# Patient Record
Sex: Female | Born: 1955
Health system: Southern US, Community
[De-identification: ages and names within clinical notes are randomized; demographics above are authoritative.]

## PROBLEM LIST (undated history)

## (undated) DIAGNOSIS — I10 Essential (primary) hypertension: Secondary | ICD-10-CM

## (undated) DIAGNOSIS — J45909 Unspecified asthma, uncomplicated: Secondary | ICD-10-CM

## (undated) DIAGNOSIS — K219 Gastro-esophageal reflux disease without esophagitis: Secondary | ICD-10-CM

## (undated) DIAGNOSIS — M1990 Unspecified osteoarthritis, unspecified site: Secondary | ICD-10-CM

## (undated) DIAGNOSIS — C50212 Malignant neoplasm of upper-inner quadrant of left female breast: Secondary | ICD-10-CM

## (undated) DIAGNOSIS — F419 Anxiety disorder, unspecified: Secondary | ICD-10-CM

## (undated) DIAGNOSIS — D649 Anemia, unspecified: Secondary | ICD-10-CM

## (undated) DIAGNOSIS — C50919 Malignant neoplasm of unspecified site of unspecified female breast: Secondary | ICD-10-CM

## (undated) DIAGNOSIS — G4700 Insomnia, unspecified: Secondary | ICD-10-CM

## (undated) HISTORY — DX: Malignant neoplasm of unspecified site of unspecified female breast: C50.919

## (undated) HISTORY — PX: TONSILLECTOMY: SUR1361

## (undated) HISTORY — DX: Anxiety disorder, unspecified: F41.9

## (undated) HISTORY — DX: Malignant neoplasm of upper-inner quadrant of left female breast: C50.212

## (undated) HISTORY — DX: Essential (primary) hypertension: I10

---

## 1998-01-08 HISTORY — PX: KNEE SURGERY: SHX244

## 1999-09-27 ENCOUNTER — Other Ambulatory Visit: Admission: RE | Admit: 1999-09-27 | Discharge: 1999-09-27 | Payer: Self-pay | Admitting: Obstetrics and Gynecology

## 2000-09-27 ENCOUNTER — Other Ambulatory Visit: Admission: RE | Admit: 2000-09-27 | Discharge: 2000-09-27 | Payer: Self-pay | Admitting: Obstetrics and Gynecology

## 2001-10-01 ENCOUNTER — Other Ambulatory Visit: Admission: RE | Admit: 2001-10-01 | Discharge: 2001-10-01 | Payer: Self-pay | Admitting: Obstetrics and Gynecology

## 2002-08-28 ENCOUNTER — Encounter: Payer: Self-pay | Admitting: Emergency Medicine

## 2002-08-28 ENCOUNTER — Emergency Department (HOSPITAL_COMMUNITY): Admission: EM | Admit: 2002-08-28 | Discharge: 2002-08-28 | Payer: Self-pay | Admitting: Emergency Medicine

## 2002-10-08 ENCOUNTER — Other Ambulatory Visit: Admission: RE | Admit: 2002-10-08 | Discharge: 2002-10-08 | Payer: Self-pay | Admitting: Obstetrics and Gynecology

## 2003-10-13 ENCOUNTER — Other Ambulatory Visit: Admission: RE | Admit: 2003-10-13 | Discharge: 2003-10-13 | Payer: Self-pay | Admitting: Obstetrics and Gynecology

## 2003-12-16 ENCOUNTER — Encounter: Admission: RE | Admit: 2003-12-16 | Discharge: 2003-12-16 | Payer: Self-pay | Admitting: Family Medicine

## 2004-10-13 ENCOUNTER — Other Ambulatory Visit: Admission: RE | Admit: 2004-10-13 | Discharge: 2004-10-13 | Payer: Self-pay | Admitting: Obstetrics and Gynecology

## 2005-10-16 ENCOUNTER — Other Ambulatory Visit: Admission: RE | Admit: 2005-10-16 | Discharge: 2005-10-16 | Payer: Self-pay | Admitting: Obstetrics and Gynecology

## 2006-10-18 ENCOUNTER — Other Ambulatory Visit: Admission: RE | Admit: 2006-10-18 | Discharge: 2006-10-18 | Payer: Self-pay | Admitting: Gynecology

## 2007-10-21 ENCOUNTER — Other Ambulatory Visit: Admission: RE | Admit: 2007-10-21 | Discharge: 2007-10-21 | Payer: Self-pay | Admitting: Family Medicine

## 2008-02-05 ENCOUNTER — Ambulatory Visit: Payer: Self-pay | Admitting: Gynecology

## 2008-02-26 ENCOUNTER — Ambulatory Visit: Payer: Self-pay | Admitting: Women's Health

## 2009-12-08 ENCOUNTER — Encounter: Payer: Self-pay | Admitting: Gastroenterology

## 2009-12-13 ENCOUNTER — Encounter (INDEPENDENT_AMBULATORY_CARE_PROVIDER_SITE_OTHER): Payer: Self-pay | Admitting: *Deleted

## 2010-01-19 ENCOUNTER — Encounter (INDEPENDENT_AMBULATORY_CARE_PROVIDER_SITE_OTHER): Payer: Self-pay | Admitting: *Deleted

## 2010-01-20 ENCOUNTER — Ambulatory Visit
Admission: RE | Admit: 2010-01-20 | Discharge: 2010-01-20 | Payer: Self-pay | Source: Home / Self Care | Attending: Gastroenterology | Admitting: Gastroenterology

## 2010-02-03 ENCOUNTER — Ambulatory Visit
Admission: RE | Admit: 2010-02-03 | Discharge: 2010-02-03 | Payer: Self-pay | Source: Home / Self Care | Attending: Gastroenterology | Admitting: Gastroenterology

## 2010-02-03 ENCOUNTER — Encounter: Payer: Self-pay | Admitting: Gastroenterology

## 2010-02-07 NOTE — Letter (Signed)
Summary: Pre Visit Letter Revised  [facility]  [address] [place], [place] [address]   Phone: [phone]  Fax: [phone]        12/13/2009 MRN: [id number] Heidi Walls [address] [place], [place]  [address]             Procedure Date:  02-03-10   Welcome to the Gastroenterology Division at [facility].    You are scheduled to see a nurse for your pre-procedure visit on 01-20-10 at 9:00A.M. on the 3rd floor at [facility], [address]  We ask that you try to arrive at our office 15 minutes prior to your appointment time to allow for check-in.  Please take a minute to review the attached form.  If you answer "Yes" to one or more of the questions on the first page, we ask that you call the person listed at your earliest opportunity.  If you answer "No" to all of the questions, please complete the rest of the form and bring it to your appointment.    Your nurse visit will consist of discussing your medical and surgical history, your immediate family medical history, and your medications.   If you are unable to list all of your medications on the form, please bring the medication bottles to your appointment and we will list them.  We will need to be aware of both prescribed and over the counter drugs.  We will need to know exact dosage information as well.    Please be prepared to read and sign documents such as consent forms, a financial agreement, and acknowledgement forms.  If necessary, and with your consent, a friend or relative is welcome to sit-in on the nurse visit with you.  Please bring your insurance card so that we may make a copy of it.  If your insurance requires a referral to see a specialist, please bring your referral form from your primary care physician.  No co-pay is required for this nurse visit.     If you cannot keep your appointment, please call [phone] to cancel or reschedule prior to your appointment date.  This allows  [place] the opportunity to schedule an appointment for another patient in need of care.    Thank you for choosing [facility] for your medical needs.  We appreciate the opportunity to care for you.  Please visit [place] at our website  to learn more about our practice.  Sincerely, The Gastroenterology Division

## 2010-02-07 NOTE — Letter (Signed)
Summary: [facility]  [facility]   Imported By: Sherian Rein 12/15/2009 11:33:12  _____________________________________________________________________  External Attachment:    Type:   Image     Comment:   External Document

## 2010-02-07 NOTE — Letter (Signed)
Summary: Pt's Medication List/[facility]  Pt's Medication List/[facility]   Imported By: Sherian Rein 12/15/2009 11:34:21  _____________________________________________________________________  External Attachment:    Type:   Image     Comment:   External Document

## 2010-02-09 NOTE — Procedures (Addendum)
Summary: Colonoscopy  Patient: Heidi Walls Note: All result statuses are Final unless otherwise noted.  Tests: (1) Colonoscopy (COL)   COL Colonoscopy           DONE     [facility]     [address]     [place], [place]  [address]           COLONOSCOPY PROCEDURE REPORT     PATIENT:  [name], [name]  MR#:  [id number]     BIRTHDATE:  02/09/1955, 54 yrs. old  GENDER:  female     ENDOSCOPIST:  Judie Petit T. Russella Dar, MD, [facility]     Referred by:  Samuel Jester, DO     PROCEDURE DATE:  02/03/2010     PROCEDURE:  Colonoscopy 19147     ASA CLASS:  Class II     INDICATIONS:  1) Routine Risk Screening     MEDICATIONS:   Fentanyl 100 mcg IV, Versed 10 mg IV     DESCRIPTION OF PROCEDURE:   After the risks benefits and     alternatives of the procedure were thoroughly explained, informed     consent was obtained.  Digital rectal exam was performed and     revealed no abnormalities.   The LB [id number]-[id number] endoscope     was introduced through the anus and advanced to the cecum, which     was identified by both the appendix and ileocecal valve, without     limitations.  The quality of the prep was excellent, using     MoviPrep.  The instrument was then slowly withdrawn as the colon     was fully examined.     <<PROCEDUREIMAGES>>     FINDINGS:  A normal appearing cecum, ileocecal valve, and     appendiceal orifice were identified. The ascending, hepatic     flexure, transverse, splenic flexure, descending, sigmoid colon,     and rectum appeared unremarkable. Retroflexed views in the rectum     revealed no abnormalities. The time to cecum =  5.5  minutes. The     scope was then withdrawn (time =  10  min) from the patient and     the procedure completed.           COMPLICATIONS:  None           ENDOSCOPIC IMPRESSION:     1) Normal colon           RECOMMENDATIONS:     1) Continue current colorectal screening for "routine risk"     patients with a repeat colonoscopy in 10 years.        Venita Lick. Russella Dar, MD, [facility]           n.     eSIGNED:   Venita Lick. Stark at 02/03/2010 10:02 AM           Anda Kraft, [phone]  Note: An exclamation mark (!) indicates a result that was not dispersed into the flowsheet. Document Creation Date: 02/03/2010 10:02 AM _______________________________________________________________________  (1) Order result status: Final Collection or observation date-time: 02/03/2010 09:59 Requested date-time:  Receipt date-time:  Reported date-time:  Referring Physician:   Ordering Physician: Claudette Head [phone]) Specimen Source:  Source: Launa Grill Order Number: [id number] Lab site:   Appended Document: Colonoscopy    Clinical Lists Changes  Observations: Added new observation of COLONNXTDUE: 01/2020 (02/03/2010 13:12)

## 2010-02-09 NOTE — Letter (Signed)
Summary: [facility] Instructions  [facility]  [address] [place], [place] [address]   Phone: [phone]  Fax: [phone]       Heidi Walls    [birth year]    MRN: [id number]        Procedure Day Dorna Bloom:  Heidi Walls  02/03/10     Arrival Time:  8:30AM     Procedure Time:  9:30AM     Location of Procedure:                    Heidi Walls  [facility] (4th Floor)                      PREPARATION FOR COLONOSCOPY WITH MOVIPREP   Starting 5 days prior to your procedure 01/29/10 do not eat nuts, seeds, popcorn, corn, beans, peas,  salads, or any raw vegetables.  Do not take any fiber supplements (e.g. Metamucil, Citrucel, and Benefiber).  THE DAY BEFORE YOUR PROCEDURE         DATE: 02/02/10  DAY: THURSDAY  1.  Drink clear liquids the entire day-NO SOLID FOOD  2.  Do not drink anything colored red or purple.  Avoid juices with pulp.  No orange juice.  3.  Drink at least 64 oz. (8 glasses) of fluid/clear liquids during the day to prevent dehydration and help the prep work efficiently.  CLEAR LIQUIDS INCLUDE: Water Jello Ice Popsicles Tea (sugar ok, no milk/cream) Powdered fruit flavored drinks Coffee (sugar ok, no milk/cream) Gatorade Juice: apple, white grape, white cranberry  Lemonade Clear bullion, consomm, broth Carbonated beverages (any kind) Strained chicken noodle soup Hard Candy                             4.  In the morning, mix first dose of MoviPrep solution:    Empty 1 Pouch A and 1 Pouch B into the disposable container    Add lukewarm drinking water to the top line of the container. Mix to dissolve    Refrigerate (mixed solution should be used within 24 hrs)  5.  Begin drinking the prep at 5:00 p.m. The MoviPrep container is divided by 4 marks.   Every 15 minutes drink the solution down to the next mark (approximately 8 oz) until the full liter is complete.   6.  Follow completed prep with 16 oz of clear liquid of your choice (Nothing  red or purple).  Continue to drink clear liquids until bedtime.  7.  Before going to bed, mix second dose of MoviPrep solution:    Empty 1 Pouch A and 1 Pouch B into the disposable container    Add lukewarm drinking water to the top line of the container. Mix to dissolve    Refrigerate  THE DAY OF YOUR PROCEDURE      DATE: 02/03/10  DAY: FRIDAY  Beginning at 4:30AM (5 hours before procedure):         1. Every 15 minutes, drink the solution down to the next mark (approx 8 oz) until the full liter is complete.  2. Follow completed prep with 16 oz. of clear liquid of your choice.    3. You may drink clear liquids until 7:30AM (2 HOURS BEFORE PROCEDURE).   MEDICATION INSTRUCTIONS  Unless otherwise instructed, you should take regular prescription medications with a small sip of water   as early as possible the morning of your  procedure.        OTHER INSTRUCTIONS  You will need a responsible adult at least 55 years of age to accompany you and drive you home.   This person must remain in the waiting room during your procedure.  Wear loose fitting clothing that is easily removed.  Leave jewelry and other valuables at home.  However, you may wish to bring a book to read or  an iPod/MP3 player to listen to music as you wait for your procedure to start.  Remove all body piercing jewelry and leave at home.  Total time from sign-in until discharge is approximately 2-3 hours.  You should go home directly after your procedure and rest.  You can resume normal activities the  day after your procedure.  The day of your procedure you should not:   Drive   Make legal decisions   Operate machinery   Drink alcohol   Return to work  You will receive specific instructions about eating, activities and medications before you leave.    The above instructions have been reviewed and explained to me by   Wyona Almas RN  January 20, 2010 9:15 AM     I fully understand and can  verbalize these instructions _____________________________ Date _________

## 2010-02-09 NOTE — Miscellaneous (Signed)
Summary: [facility] Previsit/prep  Clinical Lists Changes  Medications: Added new medication of MOVIPREP 100 GM  SOLR (PEG-KCL-NACL-NASULF-NA ASC-C) As per prep instructions. - Signed Rx of MOVIPREP 100 GM  SOLR (PEG-KCL-NACL-NASULF-NA ASC-C) As per prep instructions.;  #1 x 0;  Signed;  Entered by: Wyona Almas RN;  Authorized by: Meryl Dare MD [facility];  Method used: Electronically to [facility]  [address] [phone]*, [address] [place], [place], [place]  [address], Ph: [phone] or [phone], Fax: [phone] Allergies: Added new allergy or adverse reaction of SULFA Observations: Added new observation of NKA: F (01/20/2010 8:34)    Prescriptions: MOVIPREP 100 GM  SOLR (PEG-KCL-NACL-NASULF-NA ASC-C) As per prep instructions.  #1 x 0   Entered by:   Wyona Almas RN   Authorized by:   Meryl Dare MD [facility]   Signed by:   Wyona Almas RN on 01/20/2010   Method used:   [facility] to        [facility]  [address] [phone]* (retail)       [address]       [place], [place]  [address]       Ph: [phone] or [phone]       Fax: [phone]   RxID:   [id number]

## 2011-10-27 ENCOUNTER — Emergency Department (INDEPENDENT_AMBULATORY_CARE_PROVIDER_SITE_OTHER): Payer: BC Managed Care – PPO

## 2011-10-27 ENCOUNTER — Emergency Department
Admission: EM | Admit: 2011-10-27 | Discharge: 2011-10-27 | Disposition: A | Discharge status: Home / Self Care | Payer: BC Managed Care – PPO | Source: Home / Self Care

## 2011-10-27 DIAGNOSIS — S93402A Sprain of unspecified ligament of left ankle, initial encounter: Secondary | ICD-10-CM

## 2011-10-27 DIAGNOSIS — S93409A Sprain of unspecified ligament of unspecified ankle, initial encounter: Secondary | ICD-10-CM

## 2011-10-27 DIAGNOSIS — M7989 Other specified soft tissue disorders: Secondary | ICD-10-CM

## 2011-10-27 DIAGNOSIS — X500XXA Overexertion from strenuous movement or load, initial encounter: Secondary | ICD-10-CM

## 2011-10-27 DIAGNOSIS — M7730 Calcaneal spur, unspecified foot: Secondary | ICD-10-CM

## 2011-10-27 DIAGNOSIS — M898X9 Other specified disorders of bone, unspecified site: Secondary | ICD-10-CM

## 2011-10-27 HISTORY — DX: Insomnia, unspecified: G47.00

## 2011-10-27 HISTORY — DX: Gastro-esophageal reflux disease without esophagitis: K21.9

## 2011-10-27 HISTORY — DX: Unspecified osteoarthritis, unspecified site: M19.90

## 2011-10-27 MED ORDER — KETOROLAC TROMETHAMINE 60 MG/2ML IM SOLN
60.0000 mg | Freq: Once | INTRAMUSCULAR | Status: AC
  Administered 2011-10-27: 60 mg via INTRAMUSCULAR

## 2011-10-27 NOTE — ED Provider Notes (Signed)
History     CSN: [id number]  Arrival date & time 10/27/11  1103   None     No chief complaint on file.  Patient is a 56 y.o. female presenting with ankle pain.  Ankle Pain This is a new problem. The current episode started yesterday (Pt fell in hole yesterday, rolled L ankle ). The problem occurs constantly. Progression since onset: minimally improved  The symptoms are aggravated by walking and standing. The symptoms are relieved by ice and NSAIDs. She has tried a cold compress for the symptoms. The treatment provided mild relief.    Past Medical History  Diagnosis Date  . GERD (gastroesophageal reflux disease)   . Insomnia   . Arthritis     Past Surgical History  Procedure Date  . Knee surgery     Family History  Problem Relation Age of Onset  . Heart failure Mother   . Diabetes Mother   . Hypertension Mother   . Hypertension Father     History  Substance Use Topics  . Smoking status: Not on file  . Smokeless tobacco: Not on file  . Alcohol Use: Not on file    OB History    No data available      Review of Systems  All other systems reviewed and are negative.    Allergies  Sulfonamide derivatives  Home Medications   Current Outpatient Rx  Name Route Sig Dispense Refill  . ARIPIPRAZOLE 2 MG PO TABS Oral Take 2 mg by mouth daily.    Marland Kitchen ESOMEPRAZOLE MAGNESIUM 40 MG PO CPDR Oral Take 40 mg by mouth daily before breakfast.    . MELOXICAM PO Oral Take by mouth.    Marland Kitchen RAMELTEON 8 MG PO TABS Oral Take 8 mg by mouth at bedtime.      BP 147/75  Pulse 78  Temp 97.9 F (36.6 C) (Oral)  Resp 18  Ht 5\' 2"  (1.575 m)  Wt 147 lb 8 oz (66.906 kg)  BMI 26.98 kg/m2  SpO2 96%  Physical Exam  Constitutional: She appears well-developed and well-nourished.  HENT:  Head: Normocephalic and atraumatic.  Eyes: Conjunctivae normal are normal. Pupils are equal, round, and reactive to light.  Neck: Normal range of motion. Neck supple.  Cardiovascular: Normal rate and  regular rhythm.   Pulmonary/Chest: Effort normal and breath sounds normal.  Abdominal: Soft. Bowel sounds are normal.  Musculoskeletal:       Feet:    ED Course  Procedures (including critical care time)  Labs Reviewed - No data to display Dg Ankle Complete Left  10/27/2011  *RADIOLOGY REPORT*  Clinical Data: lateral ankle sprain rule out fracture  LEFT ANKLE COMPLETE - 3+ VIEW  Comparison: None.  Findings: Three views of the left ankle submitted.  No acute fracture or subluxation.  There is soft tissue swelling adjacent to lateral malleolus.  Spurring of the tip of distal tibia and fibula. Spurring of the posterior aspect of the calcaneus at the Achilles tendon insertion.  IMPRESSION: No acute fracture or subluxation.  Soft tissue swelling adjacent to lateral malleolus.  Spurring of the distal tibia and fibula. Posterior spurring of the calcaneus.   Original Report Authenticated By: Natasha Mead, M.D.      1. Left ankle sprain   .    MDM  RICE and NSAIDs.  Ankle brace. Discussed general care and MSK red flags for reevaluation.  Sports medicine card given for follow up.     The patient and/or caregiver  has been counseled thoroughly with regard to treatment plan and/or medications prescribed including dosage, schedule, interactions, rationale for use, and possible side effects and they verbalize understanding. Diagnoses and expected course of recovery discussed and will return if not improved as expected or if the condition worsens. Patient and/or caregiver verbalized understanding.             Doree Albee, MD 10/27/11 1157

## 2011-10-27 NOTE — ED Notes (Signed)
Patient c/o left ankle pain. States fell in a hole last night; denies pain.

## 2013-04-02 ENCOUNTER — Other Ambulatory Visit: Payer: Self-pay | Admitting: Physician Assistant

## 2013-04-02 DIAGNOSIS — M8580 Other specified disorders of bone density and structure, unspecified site: Secondary | ICD-10-CM

## 2013-04-09 ENCOUNTER — Ambulatory Visit
Admission: RE | Admit: 2013-04-09 | Discharge: 2013-04-09 | Disposition: A | Discharge status: Home / Self Care | Payer: BC Managed Care – PPO | Source: Ambulatory Visit | Attending: Physician Assistant | Admitting: Physician Assistant

## 2013-04-09 DIAGNOSIS — M8580 Other specified disorders of bone density and structure, unspecified site: Secondary | ICD-10-CM

## 2015-05-07 DIAGNOSIS — S46811A Strain of other muscles, fascia and tendons at shoulder and upper arm level, right arm, initial encounter: Secondary | ICD-10-CM | POA: Diagnosis not present

## 2015-05-14 DIAGNOSIS — Z803 Family history of malignant neoplasm of breast: Secondary | ICD-10-CM | POA: Diagnosis not present

## 2015-05-14 DIAGNOSIS — Z1231 Encounter for screening mammogram for malignant neoplasm of breast: Secondary | ICD-10-CM | POA: Diagnosis not present

## 2015-05-18 DIAGNOSIS — N63 Unspecified lump in breast: Secondary | ICD-10-CM | POA: Diagnosis not present

## 2015-05-18 DIAGNOSIS — Z803 Family history of malignant neoplasm of breast: Secondary | ICD-10-CM | POA: Diagnosis not present

## 2015-05-18 DIAGNOSIS — N6489 Other specified disorders of breast: Secondary | ICD-10-CM | POA: Diagnosis not present

## 2015-05-23 DIAGNOSIS — N63 Unspecified lump in breast: Secondary | ICD-10-CM | POA: Diagnosis not present

## 2015-05-23 DIAGNOSIS — R920 Mammographic microcalcification found on diagnostic imaging of breast: Secondary | ICD-10-CM | POA: Diagnosis not present

## 2015-05-23 DIAGNOSIS — C50312 Malignant neoplasm of lower-inner quadrant of left female breast: Secondary | ICD-10-CM | POA: Diagnosis not present

## 2015-05-23 DIAGNOSIS — Z0000 Encounter for general adult medical examination without abnormal findings: Secondary | ICD-10-CM | POA: Diagnosis not present

## 2015-05-23 DIAGNOSIS — D0512 Intraductal carcinoma in situ of left breast: Secondary | ICD-10-CM | POA: Diagnosis not present

## 2015-05-26 ENCOUNTER — Telehealth: Payer: Self-pay | Admitting: *Deleted

## 2015-05-26 ENCOUNTER — Encounter: Payer: Self-pay | Admitting: *Deleted

## 2015-05-26 DIAGNOSIS — C50212 Malignant neoplasm of upper-inner quadrant of left female breast: Secondary | ICD-10-CM

## 2015-05-26 HISTORY — DX: Malignant neoplasm of upper-inner quadrant of left female breast: C50.212

## 2015-05-26 NOTE — Telephone Encounter (Signed)
Confirmed [facility] for 06/08/15 at 0815 .  Instructions and contact information given.

## 2015-06-07 NOTE — Progress Notes (Signed)
  Radiation Oncology         [phone]) [phone] ________________________________  Initial Outpatient Consultation - Date: 06/08/2015   Name: Heidi Walls MRN: [id number]   DOB: 13-Jun-1955  REFERRING PHYSICIAN: Excell Seltzer, MD  DIAGNOSIS AND STAGE: No matching staging information was found for the patient.  Breast cancer of upper-inner quadrant of left female breast.  HISTORY OF PRESENT ILLNESS::Heidi Walls is a 60 y.o. female who presents today for initial consultation. She presents today after a routine mammogram 05/14/2015 showing left breast asymmetry. She had a diagnostic mammogram and ultrasound 05/18/2015, revealing a 1.6 cm irregular mass in the left breast in the lower-inner quadrant posterior depth is suspicious of malignancy and a 6 cm area of calcifications extending anteriorly. 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  Her maternal grandmother had breast cancer. She is feeling well.   PREVIOUS RADIATION THERAPY: No  Past medical, social and family history were reviewed in the electronic chart. Review of symptoms was reviewed in the electronic chart. Medications were reviewed in the electronic chart.   PHYSICAL EXAM: There were no vitals filed for this visit.. .  Vitals with BMI 06/08/2015  Height '5\' 2"'$   Weight 150 lbs 10 oz  BMI 06.3  Systolic 016  Diastolic 78  Pulse 010  Respirations 19    Gynecologic History  Age at first menstrual period? 15  Are you still having periods? No Approximate date of last period? 11 years ago  If you are still having periods: Are your periods regular? No  If you no longer have periods: Have you used hormone replacement? Yes  If YES, for how long? 2 years When did you stop? 7 years ago Obstetric History:  How many children have you carried to term? 2 Your age at first live birth? 57  Pregnant now  or trying to get pregnant? No  Have you used birth control pills or hormone shots for contraception? Yes  If so, for how long (or approximate dates)? 66-7 years old Health Maintenance:  Have you ever had a colonoscopy? Yes If yes, date? 2012  Have you ever had a bone density? Yes If yes, date? 2012-2013  Date of your last PAP smear? 2015 Date of your FIRST mammogram? 1989   IMPRESSION: [name] is a 60 yo woman with invasive ductal carcinoma, grade 2, in the left breast. She is a good candidate for a left mastectomy and sentinel node biopsy, a port, chemotherapy, and aromatase inhibitor.  PLAN: She will not need radiation at this time because she is getting a mastectomy. We discussed that if the tumor is greater than 5 cm or if the lymph node is positive, we can discuss radiation. She knows she can call me with any questions that she has.   I spent 40 minutes  face to face with the patient and more than 50% of that time was spent in counseling and/or coordination of care.   ------------------------------------------------  Thea Silversmith, MD    This document serves as a record of services personally performed by Thea Silversmith, MD. It was created on her behalf by  Lendon Collar, a trained medical scribe. The creation of this record is based on the scribe's personal observations and the provider's statements to them. This document has been checked and approved by the attending provider.

## 2015-06-08 ENCOUNTER — Encounter: Payer: Self-pay | Admitting: Hematology and Oncology

## 2015-06-08 ENCOUNTER — Ambulatory Visit: Payer: BLUE CROSS/BLUE SHIELD | Attending: General Surgery | Admitting: Physical Therapy

## 2015-06-08 ENCOUNTER — Encounter: Payer: Self-pay | Admitting: Physical Therapy

## 2015-06-08 ENCOUNTER — Ambulatory Visit
Admission: RE | Admit: 2015-06-08 | Discharge: 2015-06-08 | Disposition: A | Discharge status: Home / Self Care | Payer: BLUE CROSS/BLUE SHIELD | Source: Ambulatory Visit | Attending: Radiation Oncology | Admitting: Radiation Oncology

## 2015-06-08 ENCOUNTER — Other Ambulatory Visit: Payer: Self-pay | Admitting: General Surgery

## 2015-06-08 ENCOUNTER — Encounter: Payer: Self-pay | Admitting: Nurse Practitioner

## 2015-06-08 ENCOUNTER — Encounter: Payer: Self-pay | Admitting: *Deleted

## 2015-06-08 ENCOUNTER — Other Ambulatory Visit (HOSPITAL_BASED_OUTPATIENT_CLINIC_OR_DEPARTMENT_OTHER): Payer: BLUE CROSS/BLUE SHIELD

## 2015-06-08 ENCOUNTER — Ambulatory Visit (HOSPITAL_BASED_OUTPATIENT_CLINIC_OR_DEPARTMENT_OTHER): Payer: BLUE CROSS/BLUE SHIELD | Admitting: Hematology and Oncology

## 2015-06-08 ENCOUNTER — Ambulatory Visit: Payer: BLUE CROSS/BLUE SHIELD | Attending: Radiation Oncology | Admitting: Radiation Oncology

## 2015-06-08 ENCOUNTER — Encounter: Payer: Self-pay | Admitting: Skilled Nursing Facility1

## 2015-06-08 VITALS — BP 161/78 | HR 111 | Temp 98.1°F | Resp 19 | Ht 62.0 in | Wt 150.6 lb

## 2015-06-08 DIAGNOSIS — Z170 Estrogen receptor positive status [ER+]: Secondary | ICD-10-CM

## 2015-06-08 DIAGNOSIS — C50212 Malignant neoplasm of upper-inner quadrant of left female breast: Secondary | ICD-10-CM

## 2015-06-08 DIAGNOSIS — C50912 Malignant neoplasm of unspecified site of left female breast: Secondary | ICD-10-CM | POA: Diagnosis not present

## 2015-06-08 DIAGNOSIS — M542 Cervicalgia: Secondary | ICD-10-CM | POA: Diagnosis not present

## 2015-06-08 DIAGNOSIS — R293 Abnormal posture: Secondary | ICD-10-CM | POA: Diagnosis not present

## 2015-06-08 LAB — CBC WITH DIFFERENTIAL/PLATELET
BASO%: 0.5 % (ref 0.0–2.0)
Basophils Absolute: 0 10*3/uL (ref 0.0–0.1)
EOS%: 1.1 % (ref 0.0–7.0)
Eosinophils Absolute: 0.1 10*3/uL (ref 0.0–0.5)
HCT: 38.8 % (ref 34.8–46.6)
HGB: 12.5 g/dL (ref 11.6–15.9)
LYMPH%: 21 % (ref 14.0–49.7)
MCH: 27.4 pg (ref 25.1–34.0)
MCHC: 32.1 g/dL (ref 31.5–36.0)
MCV: 85.1 fL (ref 79.5–101.0)
MONO#: 0.5 10*3/uL (ref 0.1–0.9)
MONO%: 6.1 % (ref 0.0–14.0)
NEUT%: 71.3 % (ref 38.4–76.8)
NEUTROS ABS: 5.7 10*3/uL (ref 1.5–6.5)
Platelets: 312 10*3/uL (ref 145–400)
RBC: 4.56 10*6/uL (ref 3.70–5.45)
RDW: 14.1 % (ref 11.2–14.5)
WBC: 8 10*3/uL (ref 3.9–10.3)
lymph#: 1.7 10*3/uL (ref 0.9–3.3)

## 2015-06-08 LAB — COMPREHENSIVE METABOLIC PANEL WITH GFR
ALT: 27 U/L (ref 0–55)
AST: 21 U/L (ref 5–34)
Albumin: 4.2 g/dL (ref 3.5–5.0)
Alkaline Phosphatase: 62 U/L (ref 40–150)
Anion Gap: 11 meq/L (ref 3–11)
BUN: 15.1 mg/dL (ref 7.0–26.0)
CO2: 28 meq/L (ref 22–29)
Calcium: 10.1 mg/dL (ref 8.4–10.4)
Chloride: 101 meq/L (ref 98–109)
Creatinine: 0.9 mg/dL (ref 0.6–1.1)
EGFR: 73 ml/min/1.73 m2 — ABNORMAL LOW
Glucose: 108 mg/dL (ref 70–140)
Potassium: 4 meq/L (ref 3.5–5.1)
Sodium: 140 meq/L (ref 136–145)
Total Bilirubin: <0.3 mg/dL (ref 0.20–1.20)
Total Protein: 8.1 g/dL (ref 6.4–8.3)

## 2015-06-08 NOTE — Progress Notes (Signed)
Clinical Social Work [place] Psychosocial Distress Screening [place]  Patient completed distress screening protocol and scored a 5 on the Psychosocial Distress Thermometer which indicates moderate distress. Clinical Social Worker met with patient and patients family in [facility] to assess for distress and other psychosocial needs. Patient stated she was feeling overwhelmed but felt "better" after meeting with the treatment team and getting more information on her treatment plan. CSW and patient discussed common feeling and emotions when being diagnosed with cancer, and the importance of support during treatment. CSW informed patient of the support team and support services at [facility] and patient was agreeable to an [employer]. CSW provided contact information and encouraged patient to call with any questions or concerns.   ONCBCN DISTRESS SCREENING 06/08/2015  Screening Type Initial Screening  Distress experienced in past week (1-10) 5  Practical problem type Work/school  Emotional problem type Nervousness/Anxiety;Adjusting to illness  Information Concerns Type Lack of info about diagnosis;Lack of info about treatment  Physical Problem type Pain  Physician notified of physical symptoms Yes  Referral to clinical psychology No  Referral to clinical social work Yes  Referral to dietition No  Referral to financial advocate No  Referral to support programs Yes  Referral to palliative care No    Johnnye Lana, MSW, LCSW, OSW-C Clinical Social Worker [place] [phone]

## 2015-06-08 NOTE — Patient Instructions (Signed)

## 2015-06-08 NOTE — Progress Notes (Signed)
[facility] CONSULT NOTE  Patient Care Team: Terald Sleeper, PA-C as PCP - General (General Practice)  CHIEF COMPLAINTS/PURPOSE OF CONSULTATION:  Newly diagnosed breast cancer  HISTORY OF PRESENTING ILLNESS:  Heidi Walls 60 y.o. female is here because of recent diagnosis of left breast cancer. She had a screening mammogram the detected left breast asymmetry in the posterior aspect of the breast measuring 1.6 cm. Anterior to this was a 6.1 cm area of calcifications that were biopsy-proven to be high-grade DCIS. The posterior breast tumor was grade 2-3 invasive ductal carcinoma that was ER/PR positive HER-2/neu positive with a Ki-67 of 20%. She was presented this morning to the multidisciplinary tumor board and she is here accompanied by her family to discuss a treatment plan.  I reviewed her records extensively and collaborated the history with the patient.  SUMMARY OF ONCOLOGIC HISTORY:   Breast cancer of upper-inner quadrant of left female breast ([place])   05/26/2015 Initial Diagnosis Screen detected left breast asymmetry (posterior 1.6 cm): Grade 2-3 IDC ER/PR positive HER-2 positive Ki-67 20% plus calcs (span 6.1 cm): High-grade DCIS with suspicious foci of invasion (4.2 cm apart)   MEDICAL HISTORY:  Past Medical History  Diagnosis Date  . GERD (gastroesophageal reflux disease)   . Insomnia   . Arthritis   . Breast cancer of upper-inner quadrant of left female breast ([place]) 05/26/2015  . Breast cancer ([place])   . Hypertension   . Anxiety     SURGICAL HISTORY: Past Surgical History  Procedure Laterality Date  . Knee surgery      SOCIAL HISTORY: Social History   Social History  . Marital Status: Married    Spouse Name: N/A  . Number of Children: N/A  . Years of Education: N/A   Occupational History  . Not on file.   Social History Main Topics  . Smoking status: Former Research scientist (life sciences)  . Smokeless tobacco: Never Used  . Alcohol Use: Yes  . Drug Use: No  . Sexual  Activity: Not on file   Other Topics Concern  . Not on file   Social History Narrative    FAMILY HISTORY: Family History  Problem Relation Age of Onset  . Heart failure Mother   . Diabetes Mother   . Hypertension Mother   . Hypertension Father   . Breast cancer Maternal Grandmother     ALLERGIES:  is allergic to codeine and sulfonamide derivatives.  MEDICATIONS:  Current Outpatient Prescriptions  Medication Sig Dispense Refill  . alendronate (FOSAMAX) 70 MG tablet   8  . ALPRAZolam (XANAX) 0.25 MG tablet   2  . ARIPiprazole (ABILIFY) 2 MG tablet Take 2 mg by mouth daily.    . Ascorbic Acid (VITAMIN C) 1000 MG tablet Take 1,000 mg by mouth daily.    . Biotin (BIOTIN 5000) 5 MG CAPS Take 5,000 mg by mouth daily.    . cyclobenzaprine (FLEXERIL) 10 MG tablet TAKE 1 TABLET (ORAL) 3 TIMES PER DAY FOR 10 DAYS  1  . hydrochlorothiazide (HYDRODIURIL) 12.5 MG tablet Take 12.5 mg by mouth daily.    . magnesium gluconate (MAGONATE) 500 MG tablet Take 500 mg by mouth daily.    . MELOXICAM PO Take 7.5 mg by mouth.     . Multiple Vitamin (MULTIVITAMIN) tablet Take 1 tablet by mouth daily.    . Omega-3 Fatty Acids (FISH OIL) 1200 MG CAPS Take 1,200 mg by mouth daily.     No current facility-administered medications for this  visit.    REVIEW OF SYSTEMS:   Constitutional: Denies fevers, chills or abnormal night sweats Eyes: Denies blurriness of vision, double vision or watery eyes Ears, nose, mouth, throat, and face: Denies mucositis or sore throat Respiratory: Denies cough, dyspnea or wheezes Cardiovascular: Denies palpitation, chest discomfort or lower extremity swelling Gastrointestinal:  Denies nausea, heartburn or change in bowel habits Skin: Denies abnormal skin rashes Lymphatics: Denies new lymphadenopathy or easy bruising Neurological:Denies numbness, tingling or new weaknesses Behavioral/Psych: Mood is stable, no new changes  Breast:  Denies any palpable lumps or  discharge All other systems were reviewed with the patient and are negative.  PHYSICAL EXAMINATION: ECOG PERFORMANCE STATUS: 0 - Asymptomatic  Filed Vitals:   06/08/15 0847  BP: 161/78  Pulse: 111  Temp: 98.1 F (36.7 C)  Resp: 19   Filed Weights   06/08/15 0847  Weight: 150 lb 9.6 oz (68.312 kg)    GENERAL:alert, no distress and comfortable SKIN: skin color, texture, turgor are normal, no rashes or significant lesions EYES: normal, conjunctiva are pink and non-injected, sclera clear OROPHARYNX:no exudate, no erythema and lips, buccal mucosa, and tongue normal  NECK: supple, thyroid normal size, non-tender, without nodularity LYMPH:  no palpable lymphadenopathy in the cervical, axillary or inguinal LUNGS: clear to auscultation and percussion with normal breathing effort HEART: regular rate & rhythm and no murmurs and no lower extremity edema ABDOMEN:abdomen soft, non-tender and normal bowel sounds Musculoskeletal:no cyanosis of digits and no clubbing  PSYCH: alert & oriented x 3 with fluent speech NEURO: no focal motor/sensory deficits BREAST: No palpable nodules in breast. No palpable axillary or supraclavicular lymphadenopathy (exam performed in the presence of a chaperone)   LABORATORY DATA:  I have reviewed the data as listed Lab Results  Component Value Date   WBC 8.0 06/08/2015   HGB 12.5 06/08/2015   HCT 38.8 06/08/2015   MCV 85.1 06/08/2015   PLT 312 06/08/2015   Lab Results  Component Value Date   NA 140 06/08/2015   K 4.0 06/08/2015   CO2 28 06/08/2015    RADIOGRAPHIC STUDIES: I have personally reviewed the radiological reports and agreed with the findings in the report.  ASSESSMENT AND PLAN:  Breast cancer of upper-inner quadrant of left female breast ([place]) Screen detected left breast asymmetry (posterior 1.6 cm): Grade 2-3 IDC ER/PR positive HER-2 positive Ki-67 20% plus calcs (span 6.1 cm): High-grade DCIS with suspicious foci of invasion (4.2 cm  apart) diagnosed May 2017  Clinical stage: T1 cN0 stage IA  Pathology and radiology counseling: Discussed with the patient, the details of pathology including the type of breast cancer,the clinical staging, the significance of ER, PR and HER-2/neu receptors and the implications for treatment. After reviewing the pathology in detail, we proceeded to discuss the different treatment options between surgery, radiation, chemotherapy, antiestrogen therapies.  Recommendation based on multidisciplinary tumor board : 1. Left mastectomy 2. Followed by adjuvant chemotherapy with TCH 3. Followed by adjuvant hormonal therapy  Return to clinic after mastectomy to discuss chemotherapy plan Patient will need a port placement during surgery.  All questions were answered. The patient knows to call the clinic with any problems, questions or concerns.    Rulon Eisenmenger, MD 06/08/2015

## 2015-06-08 NOTE — Assessment & Plan Note (Signed)
Screen detected left breast asymmetry (posterior 1.6 cm): Grade 2-3 IDC ER/PR positive HER-2 positive Ki-67 20% plus calcs (span 6.1 cm): High-grade DCIS with suspicious foci of invasion (4.2 cm apart) diagnosed May 2017  Clinical stage: T1 cN0 stage IA  Pathology and radiology counseling: Discussed with the patient, the details of pathology including the type of breast cancer,the clinical staging, the significance of ER, PR and HER-2/neu receptors and the implications for treatment. After reviewing the pathology in detail, we proceeded to discuss the different treatment options between surgery, radiation, chemotherapy, antiestrogen therapies.  Recommendation based on multidisciplinary tumor board : 1. Left mastectomy 2. Followed by adjuvant chemotherapy with TCH 3. Followed by adjuvant hormonal therapy  Return to clinic after mastectomy to discuss chemotherapy plan Patient will need a port placement during surgery.

## 2015-06-08 NOTE — Progress Notes (Signed)
Subjective:     Patient ID: Heidi Walls, female   DOB: [birth year], 60 y.o.   MRN: [id number]  HPI   Review of Systems     Objective:   Physical Exam For the patient to understand and be given the tools to implement a healthy plant based diet during their cancer diagnosis.     Assessment:     Patient was seen today and found to be high energy and accomapnied y her husband and daughter. Pts ht 62 in, 150 pounds, and BMI 27.6. Pts medications vitamin c, bitoin, multivitamin. Pt states she is high energy and high anxiety and rarley eats three even two meals a day. Pt states she will eat grapes, half a banana, and maybe half a sandwich.     Plan:     Dietitian educated the patient on implementing a plant based diet by incorporating more plant proteins, fruits, and vegetables. As a part of a healthy routine physical activity was discussed. Dietitian educated the pt on the importance of eating and offered the option of meal smoothies along with some recipes.  The importance of legitimate, evidence based information was discussed and examples were given. A folder of evidence based information with a focus on a plant based diet and general nutrition during cancer was given to the patient.  As a part of the continuum of care the cancer dietitian's contact information was given to the patient in the event they would like to have a follow up appointment.

## 2015-06-08 NOTE — Therapy (Signed)
[place], [place], [address] Phone: [phone]   Fax:  [phone]  Physical Therapy Evaluation  Patient Details  Name: Heidi Walls MRN: [id number] Date of Birth: 10-09-1955 Referring Provider: Dr. Excell Seltzer  Encounter Date: 06/08/2015      PT End of Session - 06/08/15 0916    Visit Number 1   Number of Visits 1   PT Start Time 0945   PT Stop Time 1013   PT Time Calculation (min) 28 min   Activity Tolerance Patient tolerated treatment well   Behavior During Therapy [facility] for tasks assessed/performed      Past Medical History  Diagnosis Date  . GERD (gastroesophageal reflux disease)   . Insomnia   . Arthritis   . Breast cancer of upper-inner quadrant of left female breast ([place]) 05/26/2015  . Breast cancer ([place])   . Hypertension   . Anxiety     Past Surgical History  Procedure Laterality Date  . Knee surgery      There were no vitals filed for this visit.       Subjective Assessment - 06/08/15 0916    Subjective Patient reports she is here today to be seen by her medical team for her newly diagnosed left breast cancer.   Pertinent History Patient was diagnosed on 05/23/15 with left grade 2-3 invasive ductal carcinoma triple negative breast cancer.  It measures 1.6 cm with 6 cm of calcifications with a Ki67 of 20%.   Patient Stated Goals Reduce lymphedema risk and learn post op shoulder ROM HEP   Currently in Pain? Yes   Pain Score 8    Pain Location Neck   Pain Orientation Right   Pain Descriptors / Indicators Spasm   Pain Type Acute pain   Pain Radiating Towards right shoulder   Pain Onset 1 to 4 weeks ago   Pain Frequency Constant   Aggravating Factors  Stress   Pain Relieving Factors Flexeril  Plans to see her PCP about this pain   Multiple Pain Sites No            [facility] PT Assessment - 06/08/15 0001    Assessment   Medical Diagnosis Left breast cancer   Referring Provider Dr. Excell Seltzer   Onset Date/Surgical Date 05/23/15   Hand Dominance Right   Prior Therapy none   Precautions   Precautions Other (comment)   Precaution Comments Active breast cancer   Restrictions   Weight Bearing Restrictions No   Balance Screen   Has the patient fallen in the past 6 months No   Has the patient had a decrease in activity level because of a fear of falling?  No   Is the patient reluctant to leave their home because of a fear of falling?  No   Home Environment   Living Environment Private residence   Living Arrangements Spouse/significant other   Available Help at Discharge Family   Prior Function   Level of Independence Independent   Vocation Full time employment   Holiday representative; at desk   Leisure She does not exercise   Cognition   Overall Cognitive Status Within Functional Limits for tasks assessed   Posture/Postural Control   Posture/Postural Control Postural limitations   Postural Limitations Rounded Shoulders;Forward head   ROM / Strength   AROM / PROM / Strength AROM;Strength   AROM   Overall AROM Comments Cervical AROM is WNL with c/o pain with left rotation  and left sidebending   AROM Assessment Site Shoulder   Right/Left Shoulder Right;Left   Right Shoulder Extension 52 Degrees   Right Shoulder Flexion 155 Degrees   Right Shoulder ABduction 168 Degrees   Right Shoulder Internal Rotation 70 Degrees   Right Shoulder External Rotation 79 Degrees   Left Shoulder Extension 49 Degrees   Left Shoulder Flexion 150 Degrees   Left Shoulder ABduction 164 Degrees   Left Shoulder Internal Rotation 72 Degrees   Left Shoulder External Rotation 81 Degrees   Strength   Overall Strength Within functional limits for tasks performed   Palpation   Palpation comment Tender and palpable tightness present right upper trap and right cervical parapsinals           LYMPHEDEMA/ONCOLOGY QUESTIONNAIRE - 06/08/15 0915     Type   Cancer Type Left breast cancer   Lymphedema Assessments   Lymphedema Assessments Upper extremities   Right Upper Extremity Lymphedema   10 cm Proximal to Olecranon Process 29.5 cm   Olecranon Process 24.1 cm   10 cm Proximal to Ulnar Styloid Process 21.2 cm   Just Proximal to Ulnar Styloid Process 14.8 cm   Across Hand at [employer] 17.9 cm   At [place] of 2nd Digit 5.9 cm   Left Upper Extremity Lymphedema   10 cm Proximal to Olecranon Process 30.6 cm   Olecranon Process 24.2 cm   10 cm Proximal to Ulnar Styloid Process 20.1 cm   Just Proximal to Ulnar Styloid Process 14.9 cm   Across Hand at [employer] 17.8 cm   At [place] of 2nd Digit 5.7 cm      Patient was instructed today in a home exercise program today for post op shoulder range of motion. These included active assist shoulder flexion in sitting, scapular retraction, wall walking with shoulder abduction, and hands behind head external rotation.  She was encouraged to do these twice a day, holding 3 seconds and repeating 5 times when permitted by her physician.         PT Education - 06/08/15 [phone]    Education provided Yes   Education Details Lymphedema risk reduction and post op shoulder ROM HEP   Person(s) Educated Patient   Methods Explanation;Demonstration;Handout   Comprehension Returned demonstration;Verbalized understanding              Breast Clinic Goals - 06/08/15 1029    Patient will be able to verbalize understanding of pertinent lymphedema risk reduction practices relevant to her diagnosis specifically related to skin care.   Time 1   Period Days   Status Achieved   Patient will be able to return demonstrate and/or verbalize understanding of the post-op home exercise program related to regaining shoulder range of motion.   Time 1   Period Days   Status Achieved   Patient will be able to verbalize understanding of the importance of attending the postoperative After Breast Cancer Class for  further lymphedema risk reduction education and therapeutic exercise.   Time 1   Period Days   Status Achieved              Plan - 06/08/15 0917    Clinical Impression Statement Patient was diagnosed on 05/23/15 with left grade 2-3 invasive ductal carcinoma triple negative breast cancer.  It measures 1.6 cm with 6 cm of calcifications with a Ki67 of 20%.  Her multidisciplinary medical team met prior to her assessment to determine a recommended treatment plan. She is  planning to have a left mastectomy with a sentinel node biopsy followed by chemotherapy and anti-estrogen therapy.  She will benefit from post op PT to regain shoulder ROM and reduce lymphedema.  Due to her lack of comorbidities, her good family support, her evolving condition, her evaluation is of low complexity.   Rehab Potential Excellent   Clinical Impairments Affecting Rehab Potential none   PT Frequency One time visit   PT Treatment/Interventions Therapeutic exercise;Patient/family education   PT Next Visit Plan Will f/u after surgery   PT Home Exercise Plan Post op shouder ROM HEP   Consulted and Agree with Plan of Care Patient      Patient will benefit from skilled therapeutic intervention in order to improve the following deficits and impairments:  Decreased strength, Decreased knowledge of precautions, Pain, Impaired UE functional use, Decreased range of motion  Visit Diagnosis: Malignant neoplasm of left female breast, unspecified site of breast ([place]) - Plan: PT plan of care cert/re-cert  Abnormal posture - Plan: PT plan of care cert/re-cert  Cervicalgia - Plan: PT plan of care cert/re-cert   Patient will follow up at outpatient cancer rehab if needed following surgery.  If the patient requires physical therapy at that time, a specific plan will be dictated and sent to the referring physician for approval. The patient was educated today on appropriate basic range of motion exercises to begin post operatively  and the importance of attending the After Breast Cancer class following surgery.  Patient was educated today on lymphedema risk reduction practices as it pertains to recommendations that will benefit the patient immediately following surgery.  She verbalized good understanding.  No additional physical therapy is indicated at this time.      Problem List Patient Active Problem List   Diagnosis Date Noted  . Breast cancer of upper-inner quadrant of left female breast ([place]) 05/26/2015   Annia Friendly, PT 06/08/2015 10:35 AM  [facility] [place], [place], [address] Phone: [phone]   Fax:  [phone]  Name: KENTLEY CEDILLO MRN: [id number] Date of Birth: 05/01/1955

## 2015-06-08 NOTE — Progress Notes (Signed)
Heidi Walls is a very pleasant 60 y.o. female from [place], [place] with newly diagnosed invasive ductal carcinoma with ductal carcinoma in situ of the left breast.  Biopsy results revealed the tumor's prognostic profile is ER positive, PR positive, and HER2/neu positive.   She presents today with her husband and daughter to the [facility]) for treatment consideration and recommendations from the breast surgeon, radiation oncologist, and medical oncologist.     I briefly met with Heidi Walls and her family during her [facility] visit today. We discussed the purpose of the Survivorship Clinic, which Walls include monitoring for recurrence, coordinating completion of age and gender-appropriate cancer screenings, promotion of overall wellness, as well as managing potential late/long-term side effects of anti-cancer treatments.    The treatment plan for Heidi Walls Walls likely include surgery, chemotherapy, and anti-estrogen therapy.  As of today, the intent of treatment for Heidi Walls is cure, therefore she Walls be eligible for the Survivorship Clinic upon her completion of treatment.  Her survivorship care plan (SCP) document Walls be drafted and updated throughout the course of her treatment trajectory. She Walls receive the SCP in an office visit with myself in the Survivorship Clinic once she has completed treatment.   Heidi Walls was encouraged to ask questions and all questions were answered to her satisfaction.  She was given my business card and encouraged to contact me with any concerns regarding survivorship.  I look forward to participating in her care.   Kenn File, [place] [phone]

## 2015-06-10 ENCOUNTER — Other Ambulatory Visit: Payer: BLUE CROSS/BLUE SHIELD

## 2015-06-10 ENCOUNTER — Telehealth: Payer: Self-pay | Admitting: *Deleted

## 2015-06-13 DIAGNOSIS — C50312 Malignant neoplasm of lower-inner quadrant of left female breast: Secondary | ICD-10-CM | POA: Diagnosis not present

## 2015-06-22 ENCOUNTER — Encounter (HOSPITAL_BASED_OUTPATIENT_CLINIC_OR_DEPARTMENT_OTHER): Payer: Self-pay | Admitting: *Deleted

## 2015-06-22 ENCOUNTER — Other Ambulatory Visit: Payer: Self-pay | Admitting: General Surgery

## 2015-06-22 NOTE — H&P (Signed)
History of Present Illness Heidi Kitchen [name] MD; 06/08/2015 10:04 AM) The patient is a 60 year old female who presents with breast cancer. She is a 60 YO postmenopausal female referred by Dr. Luan Pulling for evaluation of recently diagnosed carcinoma of the left breast. She recently presented for a screening mamogram revealing a new area of architectural distortion and microcalcifications in the left breast. Subsequent imaging included diagnostic mamogram showing a 0.8 cm focal asymmetry in the left breast upper inner quadrant posterior depth 8 cm from the nipple and multiple linear calcifications extending anteriorly away from this mass for an additional 5 cm and ultrasound showing a 1.6 cm irregular mass, hypoechoic, in the lower inner left breast posterior depth. An ultrasound guided breast biopsy of the posterior 1.6 cm mass was performed 05/23/2015 on with pathology revealing invasive ductal carcinoma of the breast. 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 She is seen now in breast multidisciplinary clinic for initial treatment planning. She has experienced no breast symptoms, specifically lump or nipple discharge or skin changes or pain. she has dense breast tissue on mammogram and previous questionably abnormal mammograms on the right side but no biopsies required  Findings at that time were the following: Tumor size: 1.6 cm invasive disease and 5 cm DCIS totaling greater than 6 cm Tumor grade: 2-3 Estrogen Receptor: +100% Progesterone Receptor: +5% Her-2 neu: positive Lymph node status: negative    Other Problems Heidi Slipper, RN; 06/08/2015 7:50 AM) Anxiety Disorder Arthritis Gastroesophageal Reflux Disease High blood pressure Melanoma  Past Surgical History Heidi Slipper, RN; 06/08/2015 7:50 AM) Breast Biopsy Left. Knee Surgery Right.  Diagnostic Studies  History Heidi Slipper, RN; 06/08/2015 7:50 AM) Colonoscopy 1-5 years ago Mammogram within last year Pap Smear 1-5 years ago  Medication History Heidi Slipper, RN; 06/08/2015 7:51 AM) ALPRAZolam (0.'25MG'$  Tablet, Oral) Active. Cyclobenzaprine HCl ('10MG'$  Tablet, Oral) Active. Abilify ('2MG'$  Tablet, Oral) Active. NexIUM ('40MG'$  Capsule DR, Oral) Active. Meloxicam ('15MG'$  Tablet, Oral) Active. Ramelteon ('8MG'$  Tablet, Oral) Active. Medications Reconciled  Social History Heidi Slipper, RN; 06/08/2015 7:50 AM) Alcohol use Occasional alcohol use. Caffeine use Coffee. Illicit drug use Uses socially only. Tobacco use Former smoker.  Family History Heidi Slipper, RN; 06/08/2015 7:50 AM) Alcohol Abuse Father. Arthritis Brother, Sister. Breast Cancer Family Members In General. Diabetes Mellitus Mother. Heart Disease Mother. Heart disease in female family member before age 34 Hypertension Brother, Father, Mother, Sister.  Pregnancy / Birth History Heidi Slipper, RN; 06/08/2015 7:50 AM) Age at menarche 33 years. Age of menopause 48-50 Contraceptive History Oral contraceptives. Gravida 2 Maternal age 72-20 Para 2    Review of Systems Heidi Slipper RN; 06/08/2015 7:50 AM) General Not Present- Appetite Loss, Chills, Fatigue, Fever, Night Sweats, Weight Gain and Weight Loss. Skin Not Present- Change in Wart/Mole, Dryness, Hives, Jaundice, New Lesions, Non-Healing Wounds, Rash and Ulcer. HEENT Present- Wears glasses/contact lenses. Not Present- Earache, Hearing Loss, Hoarseness, Nose Bleed, Oral Ulcers, Ringing in the Ears, Seasonal Allergies, Sinus Pain, Sore Throat, Visual Disturbances and Yellow Eyes. Respiratory Not Present- Bloody sputum, Chronic Cough, Difficulty Breathing, Snoring and Wheezing. Breast Not Present- Breast Mass, Breast Pain, Nipple Discharge and Skin Changes. Cardiovascular Not Present- Chest Pain, Difficulty Breathing Lying Down, Leg Cramps, Palpitations, Rapid  Heart Rate, Shortness of Breath and Swelling of Extremities. Gastrointestinal Not Present- Abdominal Pain, Bloating, Bloody Stool, Change in Bowel Habits, Chronic diarrhea, Constipation, Difficulty Swallowing, Excessive gas, Gets full  quickly at meals, Hemorrhoids, Indigestion, Nausea, Rectal Pain and Vomiting. Female Genitourinary Not Present- Frequency, Nocturia, Painful Urination, Pelvic Pain and Urgency. Musculoskeletal Present- Joint Stiffness. Not Present- Back Pain, Joint Pain, Muscle Pain, Muscle Weakness and Swelling of Extremities. Neurological Not Present- Decreased Memory, Fainting, Headaches, Numbness, Seizures, Tingling, Tremor, Trouble walking and Weakness. Psychiatric Present- Anxiety. Not Present- Bipolar, Change in Sleep Pattern, Depression, Fearful and Frequent crying. Endocrine Not Present- Cold Intolerance, Excessive Hunger, Hair Changes, Heat Intolerance, Hot flashes and New Diabetes. Hematology Not Present- Easy Bruising, Excessive bleeding, Gland problems, HIV and Persistent Infections.   Physical Exam Heidi Kitchen T. Quandra Fedorchak MD; 06/08/2015 10:05 AM) The physical exam findings are as follows: Note:General: Alert, well-developed and well nourished Caucasian female, in no distress Skin: Warm and dry without rash or infection. HEENT: No palpable masses or thyromegaly. Sclera nonicteric. Pupils equal round and reactive. Lymph nodes: No cervical, supraclavicular, or inguinal nodes palpable. Breasts: Bruising medial left breast without discrete mass. No other palpable abnormalities in either breast.Skin changes or nipple inversion or crusting. No palpable axillary adenopathy. Lungs: Breath sounds clear and equal. No wheezing or increased work of breathing. Cardiovascular: Regular rate and rhythm without murmer. No JVD or edema. Peripheral pulses intact. No carotid bruits. Abdomen: Nondistended. Soft and nontender. No masses palpable. No organomegaly. No palpable  hernias. Extremities: No edema or joint swelling or deformity. No chronic venous stasis changes. Neurologic: Alert and fully oriented. Gait normal. No focal weakness. Psychiatric: Normal mood and affect. Thought content appropriate with normal judgement and insight    Assessment & Plan Heidi Kitchen T. Jazmene Racz MD; 06/08/2015 10:11 AM) STAGE I BREAST CANCER, LEFT (C48.889) Impression: 60 year old female with a new diagnosis of cancer of the left breast, lower inner quadrant. Clinical stage 1 a, ER positive, PR positive, HER-2 positive. in addition to her 1.6 cm invasive tumor there is a fairly extensivs extendingalcifications and biopsy proven DCIS extending 5-6 cm anterior to this.I discussed with the patient and family members present today initial surgical treatment options. We discussed options of breast conservation with lumpectomy or total mastectomy and sentinal lymph node biopsy/dissection. Options for reconstruction were discussed. I think this area is likely too large for successful lumpectomy with good margins and good cosmetic outcome.After discussion she is very comfortable with total mastectomy on the left side and would be happy to avoid radiation therapy. We spent a good while discussing reconstruction options and she feena very coy and down timeshe does not want additional surgery and down time evidence off thing for no reconstruction. I offered plastic surgery referral and evaluation if she changes her mind. We discussed the indications and nature of the procedure, and expected recovery, in detail. Surgical risks including anesthetic complications, cardiorespiratory complications, bleeding, infection, wound healing complications, blood clots, lymphedema, local and distant recurrence and possible need for further surgery based on the final pathology was discussed and understood. Chemotherapy, hormonal therapy and radiation therapy have been discussed. she will need a Port-A-Cath for Herceptin  and we disccific risks ofcedure including indications and specific risks of pneumothorax or catheter displacement of malfunction and DVT. They have been provided with literature regarding the treatment of breast cancer. All questions were answered. They understand and agree to proceed and we will go ahead with scheduling. Current Plans Pt Education - [facility] Breast Cancer Information Given - [facility] "Breast Journey" Package left total mastectomy and axillary sentinel lymph node biopsy and placement of Port-A-Cath

## 2015-06-22 NOTE — H&P (Signed)
  Patient of Drs. Hoxworth and Gudena with plan for breast reconstruction. Presented following screening MMG with left breast mass in the posterior aspect of the breast measuring 1.6 cm. Anterior to this was a 6.1 cm area of calcifications that were biopsy-proven to be high-grade DCIS. The posterior breast tumor biopsy with IDC,  ER/PR +HER-2/neu +. Given extent of disease mastectomy has been recommended. She will receive adjuvant chemotherapy and plan port placement at time mastectomy.   Current 34C, does not desire any larger. Wt stable. Works as Scientist, clinical (histocompatibility and immunogenetics) at [employer].   Review of Systems  Hematological: Bruises/bleeds easily.  Psychiatric/Behavioral: The patient is nervous/anxious.  Remainder of 12 point review negative     Objective:   Physical Exam  Cardiovascular: Normal rate. Normal heart sounds Pulmonary/Chest: Effort normal. Clear to ausculatation. Abdominal: Soft.  Redundant tissue without panniculus  Genitourinary: No breast discharge.  Lymphadenopathy:  She has no axillary adenopathy.  Skin:  Fitzpatrick 2, tan with elastosis chest, decolette   Pseudoptosis/grade 1 ptosis bilateral Resolving ecchymoses left SN to nipple R 24 L 24 cm BW R 16 L 16 cm Nipple to IMF R 10 L 10cm    Assessment:     Left breast cancer LIQ    Plan:     Reviewed options prosthesis and provided brochure for [facility]. Discussed immediate vs delayed, autologous vs implant based reconstruction. Reviewed incisions, drains, OR length, hospital stay and recovery for each. Discussed process of expansion and implant based risks including rupture, MRI surveillance for silicone implants, infection requiring surgery or removal, contracture. Discussed asymmetry one can expect with implant unilateral and natural breast on opposite. Discussed TRAM vs DIEP, latter would require treatment at tertiary center. Discussed abdominal based complications including failure flap, abdominal bulge or hernia. Discussed  future surgery dependent on adjuvant treatments.  Patient desires to proceed with immediate expander and possible acellular dermis reconstruction. Discussed cadaveric source ADM, use in breast reconstruction. Additional risks including but not limited to bleeding, infection, delayed healing, anesthesia risks, skin sensation changes, injury to structures including nerves, blood vessels, and muscles which may be temporary or permanent, allergies to tape, suture materials and glues, blood products, topical preparations or injected agents, skin contour irregularities, skin discoloration and swelling, deep vein thrombosis, cardiac and pulmonary complications, pain, which may persist, fluid accumulation, wrinkling of the skin over the expander, changes in nipple or breast sensation, expander leakage or rupture, faulty position of the expander, persistent pain, formation of tight scar tissue around the expander (capsular contracture), possible need for revisional surgery or staged procedures discussed.  Irene Limbo, MD [facility] Plastic & Reconstructive Surgery [phone], pin [phone]

## 2015-06-23 ENCOUNTER — Encounter (HOSPITAL_BASED_OUTPATIENT_CLINIC_OR_DEPARTMENT_OTHER)
Admission: RE | Admit: 2015-06-23 | Discharge: 2015-06-23 | Disposition: A | Discharge status: Home / Self Care | Payer: BLUE CROSS/BLUE SHIELD | Source: Ambulatory Visit | Attending: General Surgery | Admitting: General Surgery

## 2015-06-23 DIAGNOSIS — C50312 Malignant neoplasm of lower-inner quadrant of left female breast: Secondary | ICD-10-CM | POA: Diagnosis not present

## 2015-06-23 DIAGNOSIS — K219 Gastro-esophageal reflux disease without esophagitis: Secondary | ICD-10-CM | POA: Diagnosis not present

## 2015-06-23 DIAGNOSIS — C50912 Malignant neoplasm of unspecified site of left female breast: Secondary | ICD-10-CM | POA: Diagnosis present

## 2015-06-23 DIAGNOSIS — Z87891 Personal history of nicotine dependence: Secondary | ICD-10-CM | POA: Diagnosis not present

## 2015-06-23 DIAGNOSIS — I110 Hypertensive heart disease with heart failure: Secondary | ICD-10-CM | POA: Diagnosis not present

## 2015-06-23 DIAGNOSIS — Z170 Estrogen receptor positive status [ER+]: Secondary | ICD-10-CM | POA: Diagnosis not present

## 2015-06-23 NOTE — Progress Notes (Signed)
Pt given a 8oz carton of boost breeze with verbal and written instructions to drink at 900 am dos and not to drink any of liquids after midnight.  Teach back and pt verbalized understanding.

## 2015-06-24 ENCOUNTER — Encounter (HOSPITAL_BASED_OUTPATIENT_CLINIC_OR_DEPARTMENT_OTHER): Payer: Self-pay | Admitting: *Deleted

## 2015-06-24 ENCOUNTER — Encounter (HOSPITAL_COMMUNITY)
Admission: RE | Admit: 2015-06-24 | Discharge: 2015-06-24 | Disposition: A | Discharge status: Home / Self Care | Payer: BLUE CROSS/BLUE SHIELD | Source: Ambulatory Visit | Attending: General Surgery | Admitting: General Surgery

## 2015-06-24 ENCOUNTER — Ambulatory Visit (HOSPITAL_COMMUNITY): Payer: BLUE CROSS/BLUE SHIELD

## 2015-06-24 ENCOUNTER — Ambulatory Visit (HOSPITAL_BASED_OUTPATIENT_CLINIC_OR_DEPARTMENT_OTHER)
Admission: RE | Admit: 2015-06-24 | Discharge: 2015-06-25 | Disposition: A | Discharge status: Home / Self Care | Payer: BLUE CROSS/BLUE SHIELD | Source: Ambulatory Visit | Attending: General Surgery | Admitting: General Surgery

## 2015-06-24 ENCOUNTER — Ambulatory Visit (HOSPITAL_BASED_OUTPATIENT_CLINIC_OR_DEPARTMENT_OTHER): Payer: BLUE CROSS/BLUE SHIELD | Admitting: Certified Registered"

## 2015-06-24 ENCOUNTER — Other Ambulatory Visit: Payer: Self-pay | Admitting: *Deleted

## 2015-06-24 ENCOUNTER — Encounter (HOSPITAL_BASED_OUTPATIENT_CLINIC_OR_DEPARTMENT_OTHER)
Admission: RE | Disposition: A | Discharge status: Home / Self Care | Payer: Self-pay | Source: Ambulatory Visit | Attending: General Surgery

## 2015-06-24 DIAGNOSIS — I110 Hypertensive heart disease with heart failure: Secondary | ICD-10-CM | POA: Insufficient documentation

## 2015-06-24 DIAGNOSIS — Z87891 Personal history of nicotine dependence: Secondary | ICD-10-CM | POA: Insufficient documentation

## 2015-06-24 DIAGNOSIS — C50312 Malignant neoplasm of lower-inner quadrant of left female breast: Secondary | ICD-10-CM | POA: Diagnosis not present

## 2015-06-24 DIAGNOSIS — K219 Gastro-esophageal reflux disease without esophagitis: Secondary | ICD-10-CM | POA: Insufficient documentation

## 2015-06-24 DIAGNOSIS — G8918 Other acute postprocedural pain: Secondary | ICD-10-CM | POA: Diagnosis not present

## 2015-06-24 DIAGNOSIS — Z95828 Presence of other vascular implants and grafts: Secondary | ICD-10-CM

## 2015-06-24 DIAGNOSIS — Z170 Estrogen receptor positive status [ER+]: Secondary | ICD-10-CM | POA: Insufficient documentation

## 2015-06-24 DIAGNOSIS — C50912 Malignant neoplasm of unspecified site of left female breast: Secondary | ICD-10-CM | POA: Diagnosis present

## 2015-06-24 DIAGNOSIS — R079 Chest pain, unspecified: Secondary | ICD-10-CM | POA: Diagnosis not present

## 2015-06-24 DIAGNOSIS — C50919 Malignant neoplasm of unspecified site of unspecified female breast: Secondary | ICD-10-CM

## 2015-06-24 DIAGNOSIS — Z452 Encounter for adjustment and management of vascular access device: Secondary | ICD-10-CM | POA: Diagnosis not present

## 2015-06-24 HISTORY — PX: BREAST RECONSTRUCTION WITH PLACEMENT OF TISSUE EXPANDER AND FLEX HD (ACELLULAR HYDRATED DERMIS): SHX6295

## 2015-06-24 HISTORY — PX: MASTECTOMY W/ SENTINEL NODE BIOPSY: SHX2001

## 2015-06-24 HISTORY — PX: PORTACATH PLACEMENT: SHX2246

## 2015-06-24 SURGERY — INSERTION, TUNNELED CENTRAL VENOUS DEVICE, WITH PORT
Anesthesia: Regional | Site: Chest | Laterality: Right

## 2015-06-24 MED ORDER — KETOROLAC TROMETHAMINE 30 MG/ML IJ SOLN
INTRAMUSCULAR | Status: AC
  Filled 2015-06-24: qty 1

## 2015-06-24 MED ORDER — ONDANSETRON HCL 4 MG/2ML IJ SOLN
INTRAMUSCULAR | Status: DC | PRN
  Administered 2015-06-24: 4 mg via INTRAVENOUS

## 2015-06-24 MED ORDER — BUPIVACAINE-EPINEPHRINE (PF) 0.5% -1:200000 IJ SOLN
INTRAMUSCULAR | Status: DC | PRN
  Administered 2015-06-24: 30 mL via PERINEURAL

## 2015-06-24 MED ORDER — PROPOFOL 500 MG/50ML IV EMUL
INTRAVENOUS | Status: DC | PRN
  Administered 2015-06-24: 75 ug/kg/min via INTRAVENOUS

## 2015-06-24 MED ORDER — PROPOFOL 10 MG/ML IV BOLUS
INTRAVENOUS | Status: DC | PRN
  Administered 2015-06-24: 140 mg via INTRAVENOUS

## 2015-06-24 MED ORDER — CHLORHEXIDINE GLUCONATE 4 % EX LIQD: 1.0000 "application " | Freq: Once | CUTANEOUS | Status: DC

## 2015-06-24 MED ORDER — ACETAMINOPHEN 10 MG/ML IV SOLN
INTRAVENOUS | Status: AC
  Filled 2015-06-24: qty 100

## 2015-06-24 MED ORDER — MIDAZOLAM HCL 2 MG/2ML IJ SOLN
INTRAMUSCULAR | Status: AC
  Filled 2015-06-24: qty 2

## 2015-06-24 MED ORDER — LIDOCAINE HCL (CARDIAC) 20 MG/ML IV SOLN
INTRAVENOUS | Status: DC | PRN
  Administered 2015-06-24: 60 mg via INTRAVENOUS

## 2015-06-24 MED ORDER — LACTATED RINGERS IV SOLN
INTRAVENOUS | Status: DC
  Administered 2015-06-24 (×3): via INTRAVENOUS

## 2015-06-24 MED ORDER — SODIUM CHLORIDE 0.9 % IV SOLN
INTRAVENOUS | Status: DC | PRN
  Administered 2015-06-24: 300 mL

## 2015-06-24 MED ORDER — CYCLOBENZAPRINE HCL 10 MG PO TABS: 10.0000 mg | ORAL_TABLET | Freq: Three times a day (TID) | ORAL | Status: DC | PRN

## 2015-06-24 MED ORDER — SCOPOLAMINE 1 MG/3DAYS TD PT72: 1.0000 | MEDICATED_PATCH | Freq: Once | TRANSDERMAL | Status: DC | PRN

## 2015-06-24 MED ORDER — SUCCINYLCHOLINE CHLORIDE 20 MG/ML IJ SOLN
INTRAMUSCULAR | Status: DC | PRN
  Administered 2015-06-24: 60 mg via INTRAVENOUS

## 2015-06-24 MED ORDER — ROCURONIUM 10MG/ML (10ML) SYRINGE FOR MEDFUSION PUMP - OPTIME
INTRAVENOUS | Status: DC | PRN
  Administered 2015-06-24: 30 mg via INTRAVENOUS

## 2015-06-24 MED ORDER — PROPOFOL 500 MG/50ML IV EMUL
INTRAVENOUS | Status: AC
  Filled 2015-06-24: qty 50

## 2015-06-24 MED ORDER — ACETAMINOPHEN 325 MG PO TABS: 325.0000 mg | ORAL_TABLET | ORAL | Status: DC | PRN

## 2015-06-24 MED ORDER — HEPARIN (PORCINE) IN NACL 2-0.9 UNIT/ML-% IJ SOLN
INTRAMUSCULAR | Status: DC | PRN
  Administered 2015-06-24: 1 via INTRAVENOUS

## 2015-06-24 MED ORDER — OXYCODONE HCL 5 MG PO TABS: 5.0000 mg | ORAL_TABLET | Freq: Once | ORAL | Status: DC | PRN

## 2015-06-24 MED ORDER — LIDOCAINE 2% (20 MG/ML) 5 ML SYRINGE
INTRAMUSCULAR | Status: AC
  Filled 2015-06-24: qty 5

## 2015-06-24 MED ORDER — FENTANYL CITRATE (PF) 100 MCG/2ML IJ SOLN: 25.0000 ug | INTRAMUSCULAR | Status: DC | PRN

## 2015-06-24 MED ORDER — SUCCINYLCHOLINE CHLORIDE 200 MG/10ML IV SOSY
PREFILLED_SYRINGE | INTRAVENOUS | Status: AC
  Filled 2015-06-24: qty 10

## 2015-06-24 MED ORDER — ARIPIPRAZOLE 2 MG PO TABS: 2.0000 mg | ORAL_TABLET | Freq: Every day | ORAL | Status: DC

## 2015-06-24 MED ORDER — ESMOLOL HCL 100 MG/10ML IV SOLN
INTRAVENOUS | Status: AC
  Filled 2015-06-24: qty 10

## 2015-06-24 MED ORDER — GLYCOPYRROLATE 0.2 MG/ML IJ SOLN: 0.2000 mg | Freq: Once | INTRAMUSCULAR | Status: DC | PRN

## 2015-06-24 MED ORDER — FENTANYL CITRATE (PF) 100 MCG/2ML IJ SOLN
INTRAMUSCULAR | Status: AC
  Filled 2015-06-24: qty 2

## 2015-06-24 MED ORDER — TECHNETIUM TC 99M SULFUR COLLOID FILTERED
1.0000 | Freq: Once | INTRAVENOUS | Status: AC | PRN
  Administered 2015-06-24: 1 via INTRADERMAL

## 2015-06-24 MED ORDER — ACETAMINOPHEN 160 MG/5ML PO SOLN: 325.0000 mg | ORAL | Status: DC | PRN

## 2015-06-24 MED ORDER — PANTOPRAZOLE SODIUM 40 MG PO TBEC: 40.0000 mg | DELAYED_RELEASE_TABLET | Freq: Every day | ORAL | Status: DC

## 2015-06-24 MED ORDER — CEFAZOLIN SODIUM-DEXTROSE 2-4 GM/100ML-% IV SOLN
2.0000 g | INTRAVENOUS | Status: AC
  Administered 2015-06-24: 2 g via INTRAVENOUS

## 2015-06-24 MED ORDER — HEPARIN SOD (PORK) LOCK FLUSH 100 UNIT/ML IV SOLN
INTRAVENOUS | Status: AC
  Filled 2015-06-24: qty 5

## 2015-06-24 MED ORDER — OXYCODONE HCL 5 MG PO TABS
5.0000 mg | ORAL_TABLET | ORAL | Status: DC | PRN
  Administered 2015-06-24 – 2015-06-25 (×3): 5 mg via ORAL
  Filled 2015-06-24 (×3): qty 1

## 2015-06-24 MED ORDER — HEPARIN SOD (PORK) LOCK FLUSH 100 UNIT/ML IV SOLN
INTRAVENOUS | Status: DC | PRN
  Administered 2015-06-24: 500 [IU] via INTRAVENOUS

## 2015-06-24 MED ORDER — ACETAMINOPHEN 10 MG/ML IV SOLN
1000.0000 mg | Freq: Once | INTRAVENOUS | Status: AC | PRN
  Administered 2015-06-24: 1000 mg via INTRAVENOUS

## 2015-06-24 MED ORDER — OXYCODONE HCL 5 MG/5ML PO SOLN: 5.0000 mg | Freq: Once | ORAL | Status: DC | PRN

## 2015-06-24 MED ORDER — PHENYLEPHRINE 40 MCG/ML (10ML) SYRINGE FOR IV PUSH (FOR BLOOD PRESSURE SUPPORT)
PREFILLED_SYRINGE | INTRAVENOUS | Status: AC
  Filled 2015-06-24: qty 10

## 2015-06-24 MED ORDER — ONDANSETRON 4 MG PO TBDP
4.0000 mg | ORAL_TABLET | Freq: Four times a day (QID) | ORAL | Status: DC | PRN
Start: 2015-06-24 — End: 2015-06-25

## 2015-06-24 MED ORDER — SODIUM BICARBONATE 4 % IV SOLN
INTRAVENOUS | Status: AC
  Filled 2015-06-24: qty 5

## 2015-06-24 MED ORDER — DEXAMETHASONE SODIUM PHOSPHATE 4 MG/ML IJ SOLN
INTRAMUSCULAR | Status: DC | PRN
  Administered 2015-06-24: 10 mg via INTRAVENOUS

## 2015-06-24 MED ORDER — KETOROLAC TROMETHAMINE 30 MG/ML IJ SOLN
30.0000 mg | Freq: Three times a day (TID) | INTRAMUSCULAR | Status: DC
  Administered 2015-06-24 – 2015-06-25 (×2): 30 mg via INTRAVENOUS

## 2015-06-24 MED ORDER — ONDANSETRON HCL 4 MG/2ML IJ SOLN: 4.0000 mg | Freq: Four times a day (QID) | INTRAMUSCULAR | Status: DC | PRN

## 2015-06-24 MED ORDER — MORPHINE SULFATE (PF) 2 MG/ML IV SOLN: 1.0000 mg | INTRAVENOUS | Status: DC | PRN

## 2015-06-24 MED ORDER — MIDAZOLAM HCL 2 MG/2ML IJ SOLN
1.0000 mg | INTRAMUSCULAR | Status: DC | PRN
  Administered 2015-06-24 (×2): 2 mg via INTRAVENOUS

## 2015-06-24 MED ORDER — MAGNESIUM GLUCONATE 500 MG PO TABS: 500.0000 mg | ORAL_TABLET | Freq: Every day | ORAL | Status: DC

## 2015-06-24 MED ORDER — METHYLENE BLUE 0.5 % INJ SOLN
INTRAVENOUS | Status: AC
  Filled 2015-06-24: qty 10

## 2015-06-24 MED ORDER — FENTANYL CITRATE (PF) 100 MCG/2ML IJ SOLN
50.0000 ug | INTRAMUSCULAR | Status: AC | PRN
  Administered 2015-06-24 (×4): 50 ug via INTRAVENOUS
  Administered 2015-06-24: 100 ug via INTRAVENOUS

## 2015-06-24 MED ORDER — DEXAMETHASONE SODIUM PHOSPHATE 10 MG/ML IJ SOLN
INTRAMUSCULAR | Status: AC
  Filled 2015-06-24: qty 1

## 2015-06-24 MED ORDER — SODIUM CHLORIDE 0.9 % IJ SOLN
INTRAMUSCULAR | Status: AC
  Filled 2015-06-24: qty 10

## 2015-06-24 MED ORDER — KCL IN DEXTROSE-NACL 20-5-0.45 MEQ/L-%-% IV SOLN
INTRAVENOUS | Status: DC
  Administered 2015-06-24: 18:00:00 via INTRAVENOUS
  Filled 2015-06-24: qty 1000

## 2015-06-24 MED ORDER — SUGAMMADEX SODIUM 200 MG/2ML IV SOLN
INTRAVENOUS | Status: DC | PRN
  Administered 2015-06-24: 200 mg via INTRAVENOUS

## 2015-06-24 MED ORDER — HEPARIN (PORCINE) IN NACL 2-0.9 UNIT/ML-% IJ SOLN
INTRAMUSCULAR | Status: AC
Start: 2015-06-24 — End: 2015-06-24
  Filled 2015-06-24: qty 500

## 2015-06-24 MED ORDER — CEFAZOLIN SODIUM-DEXTROSE 2-4 GM/100ML-% IV SOLN
INTRAVENOUS | Status: AC
  Filled 2015-06-24: qty 100

## 2015-06-24 MED ORDER — CEFAZOLIN SODIUM 1-5 GM-% IV SOLN
1.0000 g | Freq: Three times a day (TID) | INTRAVENOUS | Status: DC
  Administered 2015-06-24 – 2015-06-25 (×2): 1 g via INTRAVENOUS
  Filled 2015-06-24 (×2): qty 50

## 2015-06-24 MED ORDER — ONDANSETRON HCL 4 MG/2ML IJ SOLN
INTRAMUSCULAR | Status: AC
  Filled 2015-06-24: qty 2

## 2015-06-24 MED ORDER — BUPIVACAINE-EPINEPHRINE 0.5% -1:200000 IJ SOLN
INTRAMUSCULAR | Status: DC | PRN
  Administered 2015-06-24: 15 mL

## 2015-06-24 MED ORDER — HYDROCHLOROTHIAZIDE 25 MG PO TABS: 12.5000 mg | ORAL_TABLET | Freq: Every day | ORAL | Status: DC

## 2015-06-24 SURGICAL SUPPLY — 109 items
ALLOGRAFT DERM CORTIV 4X12 (Tissue Mesh) ×1 IMPLANT
APL SKNCLS STERI-STRIP NONHPOA (GAUZE/BANDAGES/DRESSINGS)
APPLIER CLIP 11 MED OPEN (CLIP) ×4
APPLIER CLIP 9.375 MED OPEN (MISCELLANEOUS) ×4
APR CLP MED 11 20 MLT OPN (CLIP) ×3
APR CLP MED 9.3 20 MLT OPN (MISCELLANEOUS) ×3
BAG DECANTER FOR FLEXI CONT (MISCELLANEOUS) ×8 IMPLANT
BANDAGE ADH SHEER 1  50/CT (GAUZE/BANDAGES/DRESSINGS) ×3 IMPLANT
BENZOIN TINCTURE PRP APPL 2/3 (GAUZE/BANDAGES/DRESSINGS) IMPLANT
BINDER BREAST LRG (GAUZE/BANDAGES/DRESSINGS) ×7 IMPLANT
BINDER BREAST MEDIUM (GAUZE/BANDAGES/DRESSINGS) IMPLANT
BINDER BREAST XLRG (GAUZE/BANDAGES/DRESSINGS) IMPLANT
BINDER BREAST XXLRG (GAUZE/BANDAGES/DRESSINGS) IMPLANT
BLADE CLIPPER SURG (BLADE) IMPLANT
BLADE HEX COATED 2.75 (ELECTRODE) ×5 IMPLANT
BLADE SURG 10 STRL SS (BLADE) ×7 IMPLANT
BLADE SURG 15 STRL LF DISP TIS (BLADE) ×6 IMPLANT
BLADE SURG 15 STRL SS (BLADE) ×8
BNDG GAUZE ELAST 4 BULKY (GAUZE/BANDAGES/DRESSINGS) ×8 IMPLANT
CANISTER SUCT 1200ML W/VALVE (MISCELLANEOUS) ×8 IMPLANT
CHLORAPREP W/TINT 26ML (MISCELLANEOUS) ×8 IMPLANT
CLIP APPLIE 11 MED OPEN (CLIP) IMPLANT
CLIP APPLIE 9.375 MED OPEN (MISCELLANEOUS) IMPLANT
COVER BACK TABLE 60X90IN (DRAPES) ×4 IMPLANT
COVER MAYO STAND STRL (DRAPES) ×7 IMPLANT
COVER PROBE W GEL 5X96 (DRAPES) ×4 IMPLANT
DECANTER SPIKE VIAL GLASS SM (MISCELLANEOUS) ×3 IMPLANT
DEVICE DISSECT PLASMABLAD 3.0S (MISCELLANEOUS) IMPLANT
DEVICE DUBIN W/COMP PLATE 8390 (MISCELLANEOUS) ×3 IMPLANT
DRAIN CHANNEL 15F RND FF W/TCR (WOUND CARE) ×3 IMPLANT
DRAIN CHANNEL 19F RND (DRAIN) ×4 IMPLANT
DRAIN HEMOVAC 1/8 X 5 (WOUND CARE) IMPLANT
DRAPE C-ARM 42X72 X-RAY (DRAPES) ×4 IMPLANT
DRAPE LAPAROSCOPIC ABDOMINAL (DRAPES) ×3 IMPLANT
DRAPE SURG 17X23 STRL (DRAPES) ×4 IMPLANT
DRAPE UTILITY XL STRL (DRAPES) ×5 IMPLANT
DRSG PAD ABDOMINAL 8X10 ST (GAUZE/BANDAGES/DRESSINGS) ×8 IMPLANT
DRSG TEGADERM 4X10 (GAUZE/BANDAGES/DRESSINGS) IMPLANT
DRSG TEGADERM 4X4.75 (GAUZE/BANDAGES/DRESSINGS) IMPLANT
ELECT BLADE 4.0 EZ CLEAN MEGAD (MISCELLANEOUS) ×4
ELECT COATED BLADE 2.86 ST (ELECTRODE) ×4 IMPLANT
ELECT REM PT RETURN 9FT ADLT (ELECTROSURGICAL) ×8
ELECTRODE BLDE 4.0 EZ CLN MEGD (MISCELLANEOUS) ×3 IMPLANT
ELECTRODE REM PT RTRN 9FT ADLT (ELECTROSURGICAL) ×6 IMPLANT
EVACUATOR SILICONE 100CC (DRAIN) ×7 IMPLANT
EXPANDER TISSUE MX 400CC (Breast) IMPLANT
GAUZE SPONGE 4X4 12PLY STRL (GAUZE/BANDAGES/DRESSINGS) ×4 IMPLANT
GAUZE VASELINE 3X9 (GAUZE/BANDAGES/DRESSINGS) ×3 IMPLANT
GLOVE BIO SURGEON STRL SZ 6 (GLOVE) ×8 IMPLANT
GLOVE BIO SURGEON STRL SZ 6.5 (GLOVE) ×6 IMPLANT
GLOVE BIOGEL PI IND STRL 7.0 (GLOVE) IMPLANT
GLOVE BIOGEL PI IND STRL 8 (GLOVE) ×3 IMPLANT
GLOVE BIOGEL PI INDICATOR 7.0 (GLOVE) ×2
GLOVE BIOGEL PI INDICATOR 8 (GLOVE) ×2
GLOVE ECLIPSE 7.5 STRL STRAW (GLOVE) ×5 IMPLANT
GOWN STRL REUS W/ TWL LRG LVL3 (GOWN DISPOSABLE) ×9 IMPLANT
GOWN STRL REUS W/ TWL XL LVL3 (GOWN DISPOSABLE) ×3 IMPLANT
GOWN STRL REUS W/TWL LRG LVL3 (GOWN DISPOSABLE) ×16
GOWN STRL REUS W/TWL XL LVL3 (GOWN DISPOSABLE) ×8
IV CATH PLACEMENT UNIT 16 GA (IV SOLUTION) IMPLANT
IV KIT MINILOC 20X1 SAFETY (NEEDLE) IMPLANT
IV NS 500ML (IV SOLUTION) ×4
IV NS 500ML BAXH (IV SOLUTION) ×3 IMPLANT
KIT FILL SYSTEM UNIVERSAL (SET/KITS/TRAYS/PACK) ×4 IMPLANT
KIT PORT POWER 8FR ISP CVUE (Catheter) ×4 IMPLANT
LIGHT WAVEGUIDE WIDE FLAT (MISCELLANEOUS) ×1 IMPLANT
LIQUID BAND (GAUZE/BANDAGES/DRESSINGS) ×9 IMPLANT
NDL HYPO 25X1 1.5 SAFETY (NEEDLE) ×6 IMPLANT
NDL SAFETY ECLIPSE 18X1.5 (NEEDLE) ×3 IMPLANT
NEEDLE HYPO 18GX1.5 SHARP (NEEDLE) ×8
NEEDLE HYPO 22GX1.5 SAFETY (NEEDLE) ×4 IMPLANT
NEEDLE HYPO 25X1 1.5 SAFETY (NEEDLE) IMPLANT
NS IRRIG 1000ML POUR BTL (IV SOLUTION) ×4 IMPLANT
PACK BASIN DAY SURGERY FS (CUSTOM PROCEDURE TRAY) ×7 IMPLANT
PACK UNIVERSAL I (CUSTOM PROCEDURE TRAY) ×4 IMPLANT
PENCIL BUTTON HOLSTER BLD 10FT (ELECTRODE) ×7 IMPLANT
PIN SAFETY STERILE (MISCELLANEOUS) ×7 IMPLANT
PLASMABLADE 3.0S (MISCELLANEOUS) ×4
SET SHEATH INTRODUCER 10FR (MISCELLANEOUS) IMPLANT
SHEATH COOK PEEL AWAY SET 9F (SHEATH) IMPLANT
SHEET MEDIUM DRAPE 40X70 STRL (DRAPES) ×2 IMPLANT
SLEEVE SCD COMPRESS KNEE MED (MISCELLANEOUS) ×4 IMPLANT
SPONGE LAP 18X18 X RAY DECT (DISPOSABLE) ×12 IMPLANT
SPONGE LAP 4X18 X RAY DECT (DISPOSABLE) IMPLANT
STAPLER VISISTAT 35W (STAPLE) ×4 IMPLANT
STRIP CLOSURE SKIN 1/2X4 (GAUZE/BANDAGES/DRESSINGS) IMPLANT
SUT ETHILON 2 0 FS 18 (SUTURE) ×1 IMPLANT
SUT ETHILON 3 0 PS 1 (SUTURE) ×3 IMPLANT
SUT MNCRL AB 4-0 PS2 18 (SUTURE) ×5 IMPLANT
SUT MON AB 4-0 PC3 18 (SUTURE) ×7 IMPLANT
SUT PROLENE 2 0 CT2 30 (SUTURE) ×4 IMPLANT
SUT SILK 2 0 SH (SUTURE) IMPLANT
SUT SILK 4 0 TIES 17X18 (SUTURE) IMPLANT
SUT VIC AB 3-0 54X BRD REEL (SUTURE) IMPLANT
SUT VIC AB 3-0 BRD 54 (SUTURE) ×4
SUT VIC AB 3-0 SH 27 (SUTURE) ×12
SUT VIC AB 3-0 SH 27X BRD (SUTURE) ×6 IMPLANT
SUT VICRYL 3-0 CR8 SH (SUTURE) ×1 IMPLANT
SUT VICRYL 4-0 PS2 18IN ABS (SUTURE) ×4 IMPLANT
SYR 5ML LUER SLIP (SYRINGE) ×4 IMPLANT
SYR BULB IRRIGATION 50ML (SYRINGE) ×4 IMPLANT
SYR CONTROL 10ML LL (SYRINGE) ×7 IMPLANT
TAPE MEASURE VINYL STERILE (MISCELLANEOUS) ×4 IMPLANT
TISSUE EXPANDER MX 400CC (Breast) ×4 IMPLANT
TOWEL OR 17X24 6PK STRL BLUE (TOWEL DISPOSABLE) ×16 IMPLANT
TOWEL OR NON WOVEN STRL DISP B (DISPOSABLE) ×4 IMPLANT
TUBE CONNECTING 20X1/4 (TUBING) ×3 IMPLANT
UNDERPAD 30X30 (UNDERPADS AND DIAPERS) ×6 IMPLANT
YANKAUER SUCT BULB TIP NO VENT (SUCTIONS) ×7 IMPLANT

## 2015-06-24 NOTE — Discharge Instructions (Signed)
°  About my Jackson-Pratt Bulb Drain  What is a Jackson-Pratt bulb? A Jackson-Pratt is a soft, round device used to collect drainage. It is connected to a long, thin drainage catheter, which is held in place by one or two small stiches near your surgical incision site. When the bulb is squeezed, it forms a vacuum, forcing the drainage to empty into the bulb.  Emptying the Jackson-Pratt bulb- To empty the bulb: 1. Release the plug on the top of the bulb. 2. Pour the bulb's contents into a measuring container which your nurse will provide. 3. Record the time emptied and amount of drainage. Empty the drain(s) as often as your     doctor or nurse recommends.  Date                  Time                    Amount (Drain 1)                 Amount (Drain 2)  _____________________________________________________________________  _____________________________________________________________________  _____________________________________________________________________  _____________________________________________________________________  _____________________________________________________________________  _____________________________________________________________________  _____________________________________________________________________  _____________________________________________________________________  Squeezing the Jackson-Pratt Bulb- To squeeze the bulb: 1. Make sure the plug at the top of the bulb is open. 2. Squeeze the bulb tightly in your fist. You will hear air squeezing from the bulb. 3. Replace the plug while the bulb is squeezed. 4. Use a safety pin to attach the bulb to your clothing. This will keep the catheter from     pulling at the bulb insertion site.  When to call your doctor- Call your doctor if:  Drain site becomes red, swollen or hot.  You have a fever greater than 101 degrees F.  There is oozing at the drain site.  Drain falls out (apply a guaze  bandage over the drain hole and secure it with tape).  Drainage increases daily not related to activity patterns. (You will usually have more drainage when you are active than when you are resting.)  Drainage has a bad odor.   SACRAL DRESSING (Lower Back)   A pressure ulcer is a sore where the skin breaks open   This dressing will be placed on your lower back to protect this area from pressure and moisture and in many cases helps prevent pressure ulcers from forming   A nurse may place this dressing before your surgery or another procedure   A nurse may also place this dressing if you have other conditions that put you at risk for developing a pressure ulcer   If you are getting up and moving around after surgery, the dressing may be taken off with your first shower. Simply remove it and throw it away.   While you are in the hospital, nurses will change the dressing twice a week as long as you are still at risk for developing a pressure ulcer   This dressing is latex free and made with silicone (for adhesive sensitivity) so it is safe and gentle to the skin

## 2015-06-24 NOTE — Anesthesia Procedure Notes (Addendum)
Anesthesia Regional Block:  Pectoralis block  Pre-Anesthetic Checklist: ,, timeout performed, Correct Patient, Correct Site, Correct Laterality, Correct Procedure, Correct Position, risks and benefits discussed, surgical consent, pre-op evaluation,  At surgeon's request and post-op pain management  Laterality: N/A and Left  Prep: chloraprep       Needles:  Injection technique: Single-shot  Needle Type: Echogenic Needle          Additional Needles:  Procedures: ultrasound guided (picture in chart) Pectoralis block Narrative:  Injection made incrementally with aspirations every 5 mL.  Performed by: Personally   Additional Notes: H+P and labs reviewed, risks and benefits discussed with patient, procedure tolerated well without complications   Procedure Name: Intubation Date/Time: 06/24/2015 1:25 PM Performed by: Keajah Killough D Pre-anesthesia Checklist: Patient identified, Emergency Drugs available, Suction available and Patient being monitored Patient Re-evaluated:Patient Re-evaluated prior to inductionOxygen Delivery Method: Circle system utilized Preoxygenation: Pre-oxygenation with 100% oxygen Intubation Type: IV induction Ventilation: Mask ventilation without difficulty Laryngoscope Size: Mac and 3 Grade View: Grade II Tube type: Oral Tube size: 7.0 mm Number of attempts: 1 Airway Equipment and Method: Stylet and Oral airway Placement Confirmation: ETT inserted through vocal cords under direct vision,  positive ETCO2 and breath sounds checked- equal and bilateral Secured at: 21 cm Tube secured with: Tape Dental Injury: Teeth and Oropharynx as per pre-operative assessment

## 2015-06-24 NOTE — H&P (Signed)
History of Present Illness Heidi Walls [name] MD; 06/08/2015 10:04 AM) The patient is a 60 year old female who presents with breast cancer. She is a 60 YO postmenopausal female referred by Dr. Luan Walls for evaluation of recently diagnosed carcinoma of the left breast. She recently presented for a screening mamogram revealing a new area of architectural distortion and microcalcifications in the left breast. Subsequent imaging included diagnostic mamogram showing a 0.8 cm focal asymmetry in the left breast upper inner quadrant posterior depth 8 cm from the nipple and multiple linear calcifications extending anteriorly away from this mass for an additional 5 cm and ultrasound showing a 1.6 cm irregular mass, hypoechoic, in the lower inner left breast posterior depth. An ultrasound guided breast biopsy of the posterior 1.6 cm mass was performed 05/23/2015 on with pathology revealing invasive ductal carcinoma of the breast. 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 She is seen now in breast multidisciplinary clinic for initial treatment planning. She has experienced no breast symptoms, specifically lump or nipple discharge or skin changes or pain. she has dense breast tissue on mammogram and previous questionably abnormal mammograms on the right side but no biopsies required  Findings at that time were the following: Tumor size: 1.6 cm invasive disease and 5 cm DCIS totaling greater than 6 cm Tumor grade: 2-3 Estrogen Receptor: +100% Progesterone Receptor: +5% Her-2 neu: positive Lymph node status: negative    Other Problems Heidi Slipper, RN; 06/08/2015 7:50 AM) Anxiety Disorder Arthritis Gastroesophageal Reflux Disease High blood pressure Melanoma  Past Surgical History Heidi Slipper, RN; 06/08/2015 7:50 AM) Breast Biopsy Left. Knee Surgery Right.  Diagnostic Studies  History Heidi Slipper, RN; 06/08/2015 7:50 AM) Colonoscopy 1-5 years ago Mammogram within last year Pap Smear 1-5 years ago  Medication History Heidi Slipper, RN; 06/08/2015 7:51 AM) ALPRAZolam (0.'25MG'$  Tablet, Oral) Active. Cyclobenzaprine HCl ('10MG'$  Tablet, Oral) Active. Abilify ('2MG'$  Tablet, Oral) Active. NexIUM ('40MG'$  Capsule DR, Oral) Active. Meloxicam ('15MG'$  Tablet, Oral) Active. Ramelteon ('8MG'$  Tablet, Oral) Active. Medications Reconciled  Social History Heidi Slipper, RN; 06/08/2015 7:50 AM) Alcohol use Occasional alcohol use. Caffeine use Coffee. Illicit drug use Uses socially only. Tobacco use Former smoker.  Family History Heidi Slipper, RN; 06/08/2015 7:50 AM) Alcohol Abuse Father. Arthritis Brother, Sister. Breast Cancer Family Members In General. Diabetes Mellitus Mother. Heart Disease Mother. Heart disease in female family member before age 70 Hypertension Brother, Father, Mother, Sister.  Pregnancy / Birth History Heidi Slipper, RN; 06/08/2015 7:50 AM) Age at menarche 87 years. Age of menopause 52-50 Contraceptive History Oral contraceptives. Gravida 2 Maternal age 75-20 Para 2    Review of Systems Heidi Slipper RN; 06/08/2015 7:50 AM) General Not Present- Appetite Loss, Chills, Fatigue, Fever, Night Sweats, Weight Gain and Weight Loss. Skin Not Present- Change in Wart/Mole, Dryness, Hives, Jaundice, New Lesions, Non-Healing Wounds, Rash and Ulcer. HEENT Present- Wears glasses/contact lenses. Not Present- Earache, Hearing Loss, Hoarseness, Nose Bleed, Oral Ulcers, Ringing in the Ears, Seasonal Allergies, Sinus Pain, Sore Throat, Visual Disturbances and Yellow Eyes. Respiratory Not Present- Bloody sputum, Chronic Cough, Difficulty Breathing, Snoring and Wheezing. Breast Not Present- Breast Mass, Breast Pain, Nipple Discharge and Skin Changes. Cardiovascular Not Present- Chest Pain, Difficulty Breathing Lying Down, Leg Cramps, Palpitations, Rapid  Heart Rate, Shortness of Breath and Swelling of Extremities. Gastrointestinal Not Present- Abdominal Pain, Bloating, Bloody Stool, Change in Bowel Habits, Chronic diarrhea, Constipation, Difficulty Swallowing, Excessive gas, Gets full  quickly at meals, Hemorrhoids, Indigestion, Nausea, Rectal Pain and Vomiting. Female Genitourinary Not Present- Frequency, Nocturia, Painful Urination, Pelvic Pain and Urgency. Musculoskeletal Present- Joint Stiffness. Not Present- Back Pain, Joint Pain, Muscle Pain, Muscle Weakness and Swelling of Extremities. Neurological Not Present- Decreased Memory, Fainting, Headaches, Numbness, Seizures, Tingling, Tremor, Trouble walking and Weakness. Psychiatric Present- Anxiety. Not Present- Bipolar, Change in Sleep Pattern, Depression, Fearful and Frequent crying. Endocrine Not Present- Cold Intolerance, Excessive Hunger, Hair Changes, Heat Intolerance, Hot flashes and New Diabetes. Hematology Not Present- Easy Bruising, Excessive bleeding, Gland problems, HIV and Persistent Infections.   Physical Exam Heidi Walls T. Heidi Donath MD; 06/08/2015 10:05 AM) The physical exam findings are as follows: Note:General: Alert, well-developed and well nourished Caucasian female, in no distress Skin: Warm and dry without rash or infection. HEENT: No palpable masses or thyromegaly. Sclera nonicteric. Pupils equal round and reactive. Lymph nodes: No cervical, supraclavicular, or inguinal nodes palpable. Breasts: Bruising medial left breast without discrete mass. No other palpable abnormalities in either breast.Skin changes or nipple inversion or crusting. No palpable axillary adenopathy. Lungs: Breath sounds clear and equal. No wheezing or increased work of breathing. Cardiovascular: Regular rate and rhythm without murmer. No JVD or edema. Peripheral pulses intact. No carotid bruits. Abdomen: Nondistended. Soft and nontender. No masses palpable. No organomegaly. No palpable  hernias. Extremities: No edema or joint swelling or deformity. No chronic venous stasis changes. Neurologic: Alert and fully oriented. Gait normal. No focal weakness. Psychiatric: Normal mood and affect. Thought content appropriate with normal judgement and insight    Assessment & Plan Heidi Walls T. Kamin Niblack MD; 06/08/2015 10:11 AM) STAGE I BREAST CANCER, LEFT (G81.157) Impression: 60 year old female with a new diagnosis of cancer of the left breast, lower inner quadrant. Clinical stage 1 a, ER positive, PR positive, HER-2 positive. in addition to her 1.6 cm invasive tumor there is a fairly extensivs extendingalcifications and biopsy proven DCIS extending 5-6 cm anterior to this.I discussed with the patient and family members present today initial surgical treatment options. We discussed options of breast conservation with lumpectomy or total mastectomy and sentinal lymph node biopsy/dissection. Options for reconstruction were discussed. I think this area is likely too large for successful lumpectomy with good margins and good cosmetic outcome.After discussion she is very comfortable with total mastectomy on the left side and would be happy to avoid radiation therapy. We spent a good while discussing reconstruction options and she feena very coy and down timeshe does not want additional surgery and down time evidence off thing for no reconstruction. I offered plastic surgery referral and evaluation if she changes her mind. We discussed the indications and nature of the procedure, and expected recovery, in detail. Surgical risks including anesthetic complications, cardiorespiratory complications, bleeding, infection, wound healing complications, blood clots, lymphedema, local and distant recurrence and possible need for further surgery based on the final pathology was discussed and understood. Chemotherapy, hormonal therapy and radiation therapy have been discussed. she will need a Port-A-Cath for Herceptin  and we disccific risks ofcedure including indications and specific risks of pneumothorax or catheter displacement of malfunction and DVT. They have been provided with literature regarding the treatment of breast cancer. All questions were answered. They understand and agree to proceed and we will go ahead with scheduling. Current Plans Pt Education - [facility] Breast Cancer Information Given - [facility] "Breast Journey" Package left total mastectomy and axillary sentinel lymph node biopsy and placement of Port-A-Cath

## 2015-06-24 NOTE — Op Note (Signed)
Preoperative Diagnosis: LEFT BREAST CANCER  Postoprative Diagnosis: LEFT BREAST CANCER  Procedure: Procedure(s):  INSERTION PORT-A-CATH LEFT TOTAL MASTECTOMY WITH SENTINEL LYMPH NODE BIOPSY   Surgeon: Excell Seltzer T   Assistants: Belia Heman  Anesthesia:  General endotracheal anesthesia  Indications: patient is a 60 year old female with a recent diagnosis of stage IA ER/PR positive, HER-2 positive invasive ductal cancer of the left breast.  She also has an extensive area of calcifications and biopsy proven ductal carcinoma in situ of the same breast.  After consultation and workup we have elected to proceed with left total mastectomy and sentinel lymph node biopsy with immediate reconstruction. Port-A-Cath will be placed for Herceptin therapy.  The procedures and indications and risks have bee the patient and detailed elsewhere.    Procedure Detail:  In the holding area the patient underwent a pectoral block and underwent injection of 1 mCi of technetium sulfur colloid intradermally around the left nipple.  Patient was brought to the operating room, placed in the supine position on the operating table, and general endotracheal anesthesia induced.  She was positioned with a roll between her shoulder bladesand the right arm tucked and the left arm extended. The entire anterior neck and chest and upper abdomen and upper left arm were widelterilely prepped and draped.  She received pV antibiotics.  PAS rate and place.  Patient timeout was performed and correct procedure verified.  The Port-A-Cath was approached first.  I anesthetized the right infraclavicular area but was unable to cannulate thesubclavian vein.  I therefore anesthetized the right anterior neck and was able to cannulate the right internal jugular vein with a needle and guidewire confirmed by flouroscopy without difficulty.  The the introducer was passed over the guidewire and the flush catheter placed via the introducer and  positioned with its tip in the superior vena cava confirmed by fluoroscopy.  A site on the right anterior chest wall was chosen anesthetized and a small transverse incision madeand subcutaneous pocket created. The catheter was tunneled subcutaneously to the pocket, trimmed to length and attached to the port which was placed in the pocket and sutured with 2 2-0 Prolene sutures. Fluoroscopy again confirmed good position of the catheter. The subcutaneous tissue and skin were closed with 5-0 Monocryl and liquid band.  The catheter flushed and aspirated easily and was left flushed with concentrated heparin solution.  Attention was turned to the left mastectomy. A skin sparing elliptical transverse incision was used encompassing the nipple areolar complex.  Using the plasma blade skin and subcutaneous flaps were then raised superiorly toward the clavicle, medially to the edge of the sternum, inferiorly to the inframammary crease and laterally out to the anterior border of latissimus dorsi.  The skin flap was raised superior laterally out over the axilla. The breast tissue was then reflected up off of the chest wall working medial to lateral dissecting this off of the pectoralis major and serratus.  The specimen was dissected off of the lateral border of the pectoralis which was defined.  Attachments along the lateral serratus and lower latissimus were carefully taken down until the specimen was freed to the axillary tail.  The neoprobe was usedto identify hot areas in the left axilla.  Initially I excised 2 areas did seem to have high counts but had minimal counts after excision with no palpable lymph nodes and these were sent as axillary tissue.  It appeared I was getting quite a lot of overlap from surrounding skin.  At this point I came  across the specimen in the low axilla with Kelly clamps and tied with 3-0 Vicryl and the specimen was removed.  I did not find any significant counts in the axillary tail of the  breast specimen.  This was oriented with a suture and sent for permanent pathology.  Isolating the skin and carefully examined with the neoprobe I was able to identify a clearhot area of increased counts in the axilla.  The clavipectoral fascia was incised and using careful blunt dissection using the neoprobe for guidance I came down upon a soft slightly enlarged but normal-appearing lymph nodes with elevated counts.  This was excised with the plasma blade and ex vivo had counts of about 200.  This was sent as sentinel lymph node. I could not feel any other lymph nodes at all in the axilla and with careful examination with the neoprobe there was no further areas of elevated counts.  The wound was thoroughly irrigated and hemo  Dr. Iran Planas then proceededwith reconstruction    Findings: s above  Estimated Blood Loss:  less than 50 mL         Drains: per Dr. Iran Planas  Blood Given: none          Specimens: #1 left total mastectomy   #2axillary tissue      #3 hot left axillary sentinel lymph node        Complications:  * No complications entered in OR log *         Disposition: PACU - hemodynamically stable.         Condition: stable

## 2015-06-24 NOTE — Transfer of Care (Signed)
Immediate Anesthesia Transfer of Care Note  Patient: Heidi Walls  Procedure(s) Performed: Procedure(s): LEFT BREAST RECONSTRUCTION WITH PLACEMENT OF TISSUE EXPANDER AND  ACELLULAR HYDRATED DERMIS (Scraper) (Left) INSERTION PORT-A-CATH (Right) LEFT TOTAL MASTECTOMY WITH SENTINEL LYMPH NODE BIOPSY (Left)  Patient Location: PACU  Anesthesia Type:GA combined with regional for post-op pain  Level of Consciousness: awake, alert , oriented and patient cooperative  Airway & Oxygen Therapy: Patient Spontanous Breathing and Patient connected to face mask oxygen  Post-op Assessment: Report given to RN and Post -op Vital signs reviewed and stable  Post vital signs: Reviewed and stable  Last Vitals:  Filed Vitals:   06/24/15 1230 06/24/15 1245  BP: 130/67 135/62  Pulse: 92 100  Temp:    Resp: 23 17    Last Pain: There were no vitals filed for this visit.       Complications: No apparent anesthesia complications

## 2015-06-24 NOTE — Progress Notes (Signed)
Assisted Dr. Moser with left, ultrasound guided, pectoralis block. Side rails up, monitors on throughout procedure. See vital signs in flow sheet. Tolerated Procedure well. 

## 2015-06-24 NOTE — Anesthesia Postprocedure Evaluation (Signed)
Anesthesia Post Note  Patient: Heidi Walls  Procedure(s) Performed: Procedure(s) (LRB): LEFT BREAST RECONSTRUCTION WITH PLACEMENT OF TISSUE EXPANDER AND  ACELLULAR HYDRATED DERMIS (CORTIVA) (Left) INSERTION PORT-A-CATH (Right) LEFT TOTAL MASTECTOMY WITH SENTINEL LYMPH NODE BIOPSY (Left)  Patient location during evaluation: PACU Anesthesia Type: General and Regional Level of consciousness: awake Pain management: pain level controlled Vital Signs Assessment: post-procedure vital signs reviewed and stable Respiratory status: spontaneous breathing Cardiovascular status: stable Postop Assessment: no signs of nausea or vomiting Anesthetic complications: no    Last Vitals:  Filed Vitals:   06/24/15 1819 06/24/15 1824  BP: 170/91 158/85  Pulse: 88   Temp: 36.9 C   Resp: 18     Last Pain:  Filed Vitals:   06/24/15 1902  PainSc: 2                  Heidi Walls

## 2015-06-24 NOTE — Op Note (Signed)
Operative Note   DATE OF OPERATION: 6.16.17  LOCATION: [facility]- observation  SURGICAL DIVISION: Plastic Surgery  PREOPERATIVE DIAGNOSES:  1. Left breast cancer  POSTOPERATIVE DIAGNOSES:  same  PROCEDURE:  1. Left breast reconstruction with tissue expander 2. Acelluar dermis (Cortiva) for breast reconstruction 45 cm2  SURGEON: Irene Limbo MD MBA  ASSISTANT: none  ANESTHESIA:  General.   EBL: 150 ml for entire case  COMPLICATIONS: None immediate.   INDICATIONS FOR PROCEDURE:  The patient, Heidi Walls, is a 60 y.o. female born on 01-Dec-1955, is here for immediate reconstruction following skin sparing mastectomy and port placement.    FINDINGS: Natrelle 133MX-12-T 400 ml tissue expander placed, initial fill volume 180 ml. SN [id number]  DESCRIPTION OF PROCEDURE:  The patient's operative site was marked with the patient in the preoperative area. The patient was taken to the operating room. SCDs were placed and IV antibiotics were given. The patient's operative site was prepped and draped in a sterile fashion. A time out was performed and all information was confirmed to be correct.Following completion port placement and mastectomy, reconstruction began on left side. The inferior insertions of pectoralis major muscle were elevated continuous with abdominal wall fascia. Submuscular dissection completed toward clavicle. The serratus muscle and fascia was elevated laterally. Cortiva was perforated and sewn to inferior border of pectoralis major with running 3-0 vicryl. A 19 Fr drain was placed in subcutaneous position laterally and submuscular position medialy and secured to skin with 2-0 nylon. The cavity was irrigated with solution containing Ancef, gentamicin, and bacitracin. Hemostasis was ensured. Betadine irrigation of pocket completed. The tissue expander was prepared and placed in submuscular position. The expander was secured to chest wall with a 3-0 vicryl. The  inferior border of the acellular dermis was secured with interrupted 3-0 vicryl to serratus fascia and muscle. The incision was closed with 3-0 vicryl in fascial layer and 4-0 vicryl in dermis. Skin closure completed with 4-0 monocryl subcuticular. The port was accessed and filled to 180 ml. Tissue adhesive applied. Dry dressing and breast binder applied.  The patient was allowed to wake from anesthesia, extubated and taken to the recovery room in satisfactory condition.   SPECIMENS: none  DRAINS: 19 Fr JP in left reconstructed breast  Irene Limbo, MD [facility] Plastic [facility] [phone], pin [phone]

## 2015-06-24 NOTE — Anesthesia Preprocedure Evaluation (Signed)
Anesthesia Evaluation  Patient identified by MRN, date of birth, ID band Patient awake    Reviewed: Allergy & Precautions, NPO status , Patient's Chart, lab work & pertinent test results  History of Anesthesia Complications Negative for: history of anesthetic complications  Airway Mallampati: II  TM Distance: >3 FB Neck ROM: Full    Dental  (+) Partial Upper, Teeth Intact   Pulmonary neg shortness of breath, neg sleep apnea, neg COPD, neg recent URI, former smoker,    breath sounds clear to auscultation       Cardiovascular hypertension, Pt. on medications (-) angina(-) Past MI and (-) CHF  Rhythm:Regular     Neuro/Psych PSYCHIATRIC DISORDERS Anxiety negative neurological ROS     GI/Hepatic Neg liver ROS, GERD  Medicated and Controlled,  Endo/Other  negative endocrine ROS  Renal/GU negative Renal ROS     Musculoskeletal  (+) Arthritis ,   Abdominal   Peds  Hematology negative hematology ROS (+)   Anesthesia Other Findings   Reproductive/Obstetrics                             Anesthesia Physical Anesthesia Plan  ASA: II  Anesthesia Plan: General and Regional   Post-op Pain Management:  Regional for Post-op pain   Induction: Intravenous  Airway Management Planned: Oral ETT  Additional Equipment: None  Intra-op Plan:   Post-operative Plan: Extubation in OR  Informed Consent: I have reviewed the patients History and Physical, chart, labs and discussed the procedure including the risks, benefits and alternatives for the proposed anesthesia with the patient or authorized representative who has indicated his/her understanding and acceptance.   Dental advisory given  Plan Discussed with: CRNA and Surgeon  Anesthesia Plan Comments:         Anesthesia Quick Evaluation

## 2015-06-24 NOTE — Interval H&P Note (Signed)
History and Physical Interval Note:  06/24/2015 11:51 AM  Heidi Walls  has presented today for surgery, with the diagnosis of LEFT BREAST CANCER  The various methods of treatment have been discussed with the patient and family. After consideration of risks, benefits and other options for treatment, the patient has consented to  Procedure(s): LEFT TOTAL MASTECTOMY WITH SENTINEL LYMPH NODE BIOPSY (Left) INSERTION PORT-A-CATH (N/A) LEFT BREAST RECONSTRUCTION WITH PLACEMENT OF TISSUE EXPANDER AND POSSIBLE ACELLULAR HYDRATED DERMIS (Left) as a surgical intervention .  The patient's history has been reviewed, patient examined, no change in status, stable for surgery.  I have reviewed the patient's chart and labs.  Questions were answered to the patient's satisfaction.     Rachana Malesky

## 2015-06-25 DIAGNOSIS — Z170 Estrogen receptor positive status [ER+]: Secondary | ICD-10-CM | POA: Diagnosis not present

## 2015-06-25 DIAGNOSIS — I110 Hypertensive heart disease with heart failure: Secondary | ICD-10-CM | POA: Diagnosis not present

## 2015-06-25 DIAGNOSIS — C50312 Malignant neoplasm of lower-inner quadrant of left female breast: Secondary | ICD-10-CM | POA: Diagnosis not present

## 2015-06-25 DIAGNOSIS — Z87891 Personal history of nicotine dependence: Secondary | ICD-10-CM | POA: Diagnosis not present

## 2015-06-27 ENCOUNTER — Encounter (HOSPITAL_BASED_OUTPATIENT_CLINIC_OR_DEPARTMENT_OTHER): Payer: Self-pay | Admitting: General Surgery

## 2015-06-30 DIAGNOSIS — Z9012 Acquired absence of left breast and nipple: Secondary | ICD-10-CM | POA: Insufficient documentation

## 2015-07-01 ENCOUNTER — Ambulatory Visit (HOSPITAL_BASED_OUTPATIENT_CLINIC_OR_DEPARTMENT_OTHER): Payer: BLUE CROSS/BLUE SHIELD | Admitting: Hematology and Oncology

## 2015-07-01 ENCOUNTER — Telehealth: Payer: Self-pay | Admitting: Hematology and Oncology

## 2015-07-01 ENCOUNTER — Encounter: Payer: Self-pay | Admitting: Hematology and Oncology

## 2015-07-01 VITALS — BP 172/77 | HR 109 | Temp 98.3°F | Resp 19 | Ht 62.0 in | Wt 150.9 lb

## 2015-07-01 DIAGNOSIS — C50212 Malignant neoplasm of upper-inner quadrant of left female breast: Secondary | ICD-10-CM

## 2015-07-01 DIAGNOSIS — Z170 Estrogen receptor positive status [ER+]: Secondary | ICD-10-CM

## 2015-07-01 MED ORDER — PROCHLORPERAZINE MALEATE 10 MG PO TABS: 10.0000 mg | ORAL_TABLET | Freq: Four times a day (QID) | ORAL | Status: DC | PRN

## 2015-07-01 MED ORDER — LIDOCAINE-PRILOCAINE 2.5-2.5 % EX CREA: TOPICAL_CREAM | CUTANEOUS | Status: DC

## 2015-07-01 MED ORDER — ONDANSETRON HCL 8 MG PO TABS
8.0000 mg | ORAL_TABLET | Freq: Two times a day (BID) | ORAL | Status: DC | PRN
Start: 2015-07-01 — End: 2015-07-01

## 2015-07-01 MED ORDER — ONDANSETRON HCL 8 MG PO TABS: 8.0000 mg | ORAL_TABLET | Freq: Two times a day (BID) | ORAL | Status: DC | PRN

## 2015-07-01 MED ORDER — LORAZEPAM 0.5 MG PO TABS: 0.5000 mg | ORAL_TABLET | Freq: Every day | ORAL | Status: DC

## 2015-07-01 MED ORDER — LORAZEPAM 0.5 MG PO TABS
0.5000 mg | ORAL_TABLET | Freq: Four times a day (QID) | ORAL | Status: DC | PRN
Start: 2015-07-01 — End: 2015-11-28

## 2015-07-01 MED ORDER — DEXAMETHASONE 4 MG PO TABS: 4.0000 mg | ORAL_TABLET | Freq: Every day | ORAL | Status: DC

## 2015-07-01 NOTE — Telephone Encounter (Signed)
appt made and avs printed °

## 2015-07-01 NOTE — Progress Notes (Signed)
Patient Care Team: Terald Sleeper, PA-C as PCP - General ([place]) Sylvan Cheese, NP as Nurse Practitioner (Hematology and Oncology)  DIAGNOSIS: Breast cancer of upper-inner quadrant of left female breast [facility])   Staging form: Breast, AJCC 7th Edition     Clinical stage from 06/08/2015: Stage IA (T1c, N0, M0) - Unsigned       Staging comments: Staged at breast conference on 5.31.17  SUMMARY OF ONCOLOGIC HISTORY:   Breast cancer of upper-inner quadrant of left female breast ([place])   05/26/2015 Initial Diagnosis Screen detected left breast asymmetry (posterior 1.6 cm): Grade 2-3 IDC ER/PR positive HER-2 positive Ki-67 20% plus calcs (span 6.1 cm): High-grade DCIS with suspicious foci of invasion (4.2 cm apart)   06/24/2015 Surgery Left simple mastectomy with reconstruction: IDC grade 3, 2.5 cm, with high-grade DCIS, ALH, LVI present, margins negative, 0/1 sentinel node negative, T2 N0 stage II a, ER 100%, PR 5%, HER-2 positive ratio 1.56 with average copy #8.25, Ki-67 20%   CHIEF COMPLIANT: Follow-up after mastectomy  INTERVAL HISTORY: Heidi Walls is a 60 year old with above-mentioned history of left breast cancer who underwent mastectomy and is here today to discuss the pathology report. She is healing very well from the surgery and reconstruction. She is still very emotional about the experience of getting diagnosed with breast cancer. She is trying her best not to do too much activity.  REVIEW OF SYSTEMS:   Constitutional: Denies fevers, chills or abnormal weight loss Eyes: Denies blurriness of vision Ears, nose, mouth, throat, and face: Denies mucositis or sore throat Respiratory: Denies cough, dyspnea or wheezes Cardiovascular: Denies palpitation, chest discomfort Gastrointestinal:  Denies nausea, heartburn or change in bowel habits Skin: Denies abnormal skin rashes Lymphatics: Denies new lymphadenopathy or easy bruising Neurological:Denies numbness, tingling or  new weaknesses Behavioral/Psych: Mood is stable, no new changes  Extremities: No lower extremity edema Breast: Recent left mastectomy All other systems were reviewed with the patient and are negative.  I have reviewed the past medical history, past surgical history, social history and family history with the patient and they are unchanged from previous note.  ALLERGIES:  is allergic to codeine.  MEDICATIONS:  Current Outpatient Prescriptions  Medication Sig Dispense Refill  . alendronate (FOSAMAX) 70 MG tablet   8  . ALPRAZolam (XANAX) 0.25 MG tablet   2  . ARIPiprazole (ABILIFY) 2 MG tablet Take 2 mg by mouth daily.    . Ascorbic Acid (VITAMIN C) 1000 MG tablet Take 1,000 mg by mouth daily.    . Biotin (BIOTIN 5000) 5 MG CAPS Take 5,000 mg by mouth daily.    . cyclobenzaprine (FLEXERIL) 10 MG tablet TAKE 1 TABLET (ORAL) 3 TIMES PER DAY FOR 10 DAYS  1  . esomeprazole (NEXIUM) 20 MG capsule Take 20 mg by mouth daily at 12 noon.    . hydrochlorothiazide (HYDRODIURIL) 12.5 MG tablet Take 12.5 mg by mouth daily.    . magnesium gluconate (MAGONATE) 500 MG tablet Take 500 mg by mouth daily.    . MELOXICAM PO Take 7.5 mg by mouth.     . Multiple Vitamin (MULTIVITAMIN) tablet Take 1 tablet by mouth daily.    . Omega-3 Fatty Acids (FISH OIL) 1200 MG CAPS Take 1,200 mg by mouth daily.     No current facility-administered medications for this visit.    PHYSICAL EXAMINATION: ECOG PERFORMANCE STATUS: 1 - Symptomatic but completely ambulatory  Filed Vitals:   07/01/15 1044  BP: 172/77  Pulse:  109  Temp: 98.3 F (36.8 C)  Resp: 19   Filed Weights   07/01/15 1044  Weight: 150 lb 14.4 oz (68.448 kg)    GENERAL:alert, no distress and comfortable SKIN: skin color, texture, turgor are normal, no rashes or significant lesions EYES: normal, Conjunctiva are pink and non-injected, sclera clear OROPHARYNX:no exudate, no erythema and lips, buccal mucosa, and tongue normal  NECK: supple,  thyroid normal size, non-tender, without nodularity LYMPH:  no palpable lymphadenopathy in the cervical, axillary or inguinal LUNGS: clear to auscultation and percussion with normal breathing effort HEART: regular rate & rhythm and no murmurs and no lower extremity edema ABDOMEN:abdomen soft, non-tender and normal bowel sounds MUSCULOSKELETAL:no cyanosis of digits and no clubbing  NEURO: alert & oriented x 3 with fluent speech, no focal motor/sensory deficits EXTREMITIES: No lower extremity edema  LABORATORY DATA:  I have reviewed the data as listed   Chemistry      Component Value Date/Time   NA 140 06/08/2015 0834   K 4.0 06/08/2015 0834   CO2 28 06/08/2015 0834   BUN 15.1 06/08/2015 0834   CREATININE 0.9 06/08/2015 0834      Component Value Date/Time   CALCIUM 10.1 06/08/2015 0834   ALKPHOS 62 06/08/2015 0834   AST 21 06/08/2015 0834   ALT 27 06/08/2015 0834   BILITOT <0.30 06/08/2015 0834       Lab Results  Component Value Date   WBC 8.0 06/08/2015   HGB 12.5 06/08/2015   HCT 38.8 06/08/2015   MCV 85.1 06/08/2015   PLT 312 06/08/2015   NEUTROABS 5.7 06/08/2015     ASSESSMENT & PLAN:  Breast cancer of upper-inner quadrant of left female breast ([place]) Left simple mastectomy with reconstruction 06/24/2015: IDC grade 3, 2.5 cm, with high-grade DCIS, ALH, LVI present, margins negative, 0/1 sentinel node negative, , ER 100%, PR 5%, HER-2 positive ratio 1.56 with average copy #8.25, Ki-67 20%  Pathologic stage:T2 N0 stage II a  Recommendation based on multidisciplinary tumor board : 1. Adjuvant chemotherapy with TCHP followed by Herceptin maintenance for 1 year 2. Followed by adjuvant hormonal therapy  I discussed with the patient that adding Per[facility] to [facility] were dramatically improve his survival and outcomes for patients who have more than 2 cm size HER-2 positive breast cancer.  Patient will need to undergo echocardiogram and chemotherapy class. Return to clinic 2  weeks to start chemotherapy   No orders of the defined types were placed in this encounter.   The patient has a good understanding of the overall plan. she agrees with it. she will call with any problems that may develop before the next visit here.   Rulon Eisenmenger, MD 07/01/2015

## 2015-07-01 NOTE — Assessment & Plan Note (Signed)
Left simple mastectomy with reconstruction 06/24/2015: IDC grade 3, 2.5 cm, with high-grade DCIS, ALH, LVI present, margins negative, 0/1 sentinel node negative, , ER 100%, PR 5%, HER-2 positive ratio 1.56 with average copy #8.25, Ki-67 20%  Pathologic stage:T2 N0 stage II a  Recommendation based on multidisciplinary tumor board : 1. Adjuvant chemotherapy with TCHP followed by Herceptin maintenance for 1 year 2. Followed by adjuvant hormonal therapy  Patient will need to undergo echocardiogram and chemotherapy class. Return to clinic 2 weeks to start chemotherapy

## 2015-07-08 ENCOUNTER — Ambulatory Visit: Payer: BLUE CROSS/BLUE SHIELD

## 2015-07-25 ENCOUNTER — Other Ambulatory Visit (HOSPITAL_BASED_OUTPATIENT_CLINIC_OR_DEPARTMENT_OTHER): Payer: BLUE CROSS/BLUE SHIELD

## 2015-07-25 ENCOUNTER — Encounter: Payer: Self-pay | Admitting: *Deleted

## 2015-07-25 ENCOUNTER — Encounter: Payer: Self-pay | Admitting: Hematology and Oncology

## 2015-07-25 ENCOUNTER — Ambulatory Visit (HOSPITAL_BASED_OUTPATIENT_CLINIC_OR_DEPARTMENT_OTHER): Payer: BLUE CROSS/BLUE SHIELD | Admitting: Hematology and Oncology

## 2015-07-25 ENCOUNTER — Ambulatory Visit (HOSPITAL_BASED_OUTPATIENT_CLINIC_OR_DEPARTMENT_OTHER): Payer: BLUE CROSS/BLUE SHIELD

## 2015-07-25 VITALS — BP 154/61 | HR 75 | Temp 98.2°F | Resp 18 | Wt 155.9 lb

## 2015-07-25 VITALS — BP 145/73 | HR 80 | Temp 99.0°F | Resp 17

## 2015-07-25 DIAGNOSIS — C50212 Malignant neoplasm of upper-inner quadrant of left female breast: Secondary | ICD-10-CM

## 2015-07-25 DIAGNOSIS — Z5111 Encounter for antineoplastic chemotherapy: Secondary | ICD-10-CM

## 2015-07-25 DIAGNOSIS — Z5112 Encounter for antineoplastic immunotherapy: Secondary | ICD-10-CM

## 2015-07-25 LAB — COMPREHENSIVE METABOLIC PANEL
ALBUMIN: 3.8 g/dL (ref 3.5–5.0)
ALK PHOS: 82 U/L (ref 40–150)
ALT: 33 U/L (ref 0–55)
ANION GAP: 11 meq/L (ref 3–11)
AST: 25 U/L (ref 5–34)
BUN: 16.4 mg/dL (ref 7.0–26.0)
CO2: 27 meq/L (ref 22–29)
Calcium: 9.7 mg/dL (ref 8.4–10.4)
Chloride: 100 mEq/L (ref 98–109)
Creatinine: 0.8 mg/dL (ref 0.6–1.1)
EGFR: 79 mL/min/{1.73_m2} — AB (ref >90)
Glucose: 111 mg/dl (ref 70–140)
Potassium: 3.5 mEq/L (ref 3.5–5.1)
SODIUM: 138 meq/L (ref 136–145)
TOTAL PROTEIN: 7.7 g/dL (ref 6.4–8.3)

## 2015-07-25 LAB — CBC WITH DIFFERENTIAL/PLATELET
BASO%: 0.1 % (ref 0.0–2.0)
Basophils Absolute: 0 10*3/uL (ref 0.0–0.1)
EOS ABS: 0.1 10*3/uL (ref 0.0–0.5)
EOS%: 0.4 % (ref 0.0–7.0)
HCT: 34.8 % (ref 34.8–46.6)
HGB: 11.2 g/dL — ABNORMAL LOW (ref 11.6–15.9)
LYMPH%: 16.5 % (ref 14.0–49.7)
MCH: 27.3 pg (ref 25.1–34.0)
MCHC: 32.2 g/dL (ref 31.5–36.0)
MCV: 84.7 fL (ref 79.5–101.0)
MONO#: 0.8 10*3/uL (ref 0.1–0.9)
MONO%: 6.6 % (ref 0.0–14.0)
NEUT%: 76.4 % (ref 38.4–76.8)
NEUTROS ABS: 8.8 10*3/uL — AB (ref 1.5–6.5)
Platelets: 315 10*3/uL (ref 145–400)
RBC: 4.11 10*6/uL (ref 3.70–5.45)
RDW: 13.6 % (ref 11.2–14.5)
WBC: 11.5 10*3/uL — AB (ref 3.9–10.3)
lymph#: 1.9 10*3/uL (ref 0.9–3.3)

## 2015-07-25 MED ORDER — DIPHENHYDRAMINE HCL 25 MG PO CAPS
50.0000 mg | ORAL_CAPSULE | Freq: Once | ORAL | Status: AC
  Administered 2015-07-25: 50 mg via ORAL

## 2015-07-25 MED ORDER — SODIUM CHLORIDE 0.9% FLUSH
10.0000 mL | INTRAVENOUS | Status: DC | PRN
  Administered 2015-07-25: 10 mL
  Filled 2015-07-25: qty 10

## 2015-07-25 MED ORDER — SODIUM CHLORIDE 0.9 % IV SOLN
Freq: Once | INTRAVENOUS | Status: AC
  Administered 2015-07-25: 10:00:00 via INTRAVENOUS

## 2015-07-25 MED ORDER — SODIUM CHLORIDE 0.9 % IV SOLN
Freq: Once | INTRAVENOUS | Status: AC
  Administered 2015-07-25: 14:00:00 via INTRAVENOUS
  Filled 2015-07-25: qty 5

## 2015-07-25 MED ORDER — SODIUM CHLORIDE 0.9 % IV SOLN
840.0000 mg | Freq: Once | INTRAVENOUS | Status: AC
  Administered 2015-07-25: 840 mg via INTRAVENOUS
  Filled 2015-07-25: qty 28

## 2015-07-25 MED ORDER — DIPHENHYDRAMINE HCL 25 MG PO CAPS
ORAL_CAPSULE | ORAL | Status: AC
  Filled 2015-07-25: qty 2

## 2015-07-25 MED ORDER — ACETAMINOPHEN 325 MG PO TABS
ORAL_TABLET | ORAL | Status: AC
  Filled 2015-07-25: qty 2

## 2015-07-25 MED ORDER — PALONOSETRON HCL INJECTION 0.25 MG/5ML
0.2500 mg | Freq: Once | INTRAVENOUS | Status: AC
  Administered 2015-07-25: 0.25 mg via INTRAVENOUS

## 2015-07-25 MED ORDER — ACETAMINOPHEN 325 MG PO TABS
650.0000 mg | ORAL_TABLET | Freq: Once | ORAL | Status: AC
  Administered 2015-07-25: 650 mg via ORAL

## 2015-07-25 MED ORDER — PEGFILGRASTIM 6 MG/0.6ML ~~LOC~~ PSKT
6.0000 mg | PREFILLED_SYRINGE | Freq: Once | SUBCUTANEOUS | Status: AC
  Administered 2015-07-25: 6 mg via SUBCUTANEOUS
  Filled 2015-07-25: qty 0.6

## 2015-07-25 MED ORDER — HEPARIN SOD (PORK) LOCK FLUSH 100 UNIT/ML IV SOLN
500.0000 [IU] | Freq: Once | INTRAVENOUS | Status: AC | PRN
  Administered 2015-07-25: 500 [IU]
  Filled 2015-07-25: qty 5

## 2015-07-25 MED ORDER — TRASTUZUMAB CHEMO INJECTION 440 MG
8.0000 mg/kg | Freq: Once | INTRAVENOUS | Status: AC
  Administered 2015-07-25: 546 mg via INTRAVENOUS
  Filled 2015-07-25: qty 26

## 2015-07-25 MED ORDER — SODIUM CHLORIDE 0.9 % IV SOLN
75.0000 mg/m2 | Freq: Once | INTRAVENOUS | Status: AC
  Administered 2015-07-25: 130 mg via INTRAVENOUS
  Filled 2015-07-25: qty 13

## 2015-07-25 MED ORDER — SODIUM CHLORIDE 0.9 % IV SOLN
700.0000 mg | Freq: Once | INTRAVENOUS | Status: AC
  Administered 2015-07-25: 700 mg via INTRAVENOUS
  Filled 2015-07-25: qty 70

## 2015-07-25 MED ORDER — SODIUM CHLORIDE 0.9 % IV SOLN: 10.0000 mg | Freq: Once | INTRAVENOUS | Status: DC

## 2015-07-25 MED ORDER — PALONOSETRON HCL INJECTION 0.25 MG/5ML
INTRAVENOUS | Status: AC
  Filled 2015-07-25: qty 5

## 2015-07-25 NOTE — Patient Instructions (Signed)
[facility] Discharge Instructions for Patients Receiving Chemotherapy  Today you received the following chemotherapy agents: Herceptin, Perjeta, Taxotere and Carboplatin.  To help prevent nausea and vomiting after your treatment, we encourage you to take your nausea medication:Compazine 10 mg every 6 hours as needed; Ativan 0.5 mg every 6 hours as needed.   If you develop nausea and vomiting that is not controlled by your nausea medication, call the clinic.   BELOW ARE SYMPTOMS THAT SHOULD BE REPORTED IMMEDIATELY:  *FEVER GREATER THAN 100.5 F  *CHILLS WITH OR WITHOUT FEVER  NAUSEA AND VOMITING THAT IS NOT CONTROLLED WITH YOUR NAUSEA MEDICATION  *UNUSUAL SHORTNESS OF BREATH  *UNUSUAL BRUISING OR BLEEDING  TENDERNESS IN MOUTH AND THROAT WITH OR WITHOUT PRESENCE OF ULCERS  *URINARY PROBLEMS  *BOWEL PROBLEMS  UNUSUAL RASH Items with * indicate a potential emergency and should be followed up as soon as possible.  Feel free to call the clinic you have any questions or concerns. The clinic phone number is [phone].  Please show the [place] at check-in to the Emergency Department and triage nurse.    Docetaxel injection What is this medicine? DOCETAXEL (doe se TAX el) is a chemotherapy drug. It targets fast dividing cells, like cancer cells, and causes these cells to die. This medicine is used to treat many types of cancers like breast cancer, certain stomach cancers, head and neck cancer, lung cancer, and prostate cancer. This medicine may be used for other purposes; ask your health care provider or pharmacist if you have questions. What should I tell my health care provider before I take this medicine? They need to know if you have any of these conditions: -infection (especially a virus infection such as chickenpox, cold sores, or herpes) -liver disease -low blood counts, like low white cell, platelet, or red cell counts -an unusual or allergic  reaction to docetaxel, polysorbate 80, other chemotherapy agents, other medicines, foods, dyes, or preservatives -pregnant or trying to get pregnant -breast-feeding How should I use this medicine? This drug is given as an infusion into a vein. It is administered in a hospital or clinic by a specially trained health care professional. Talk to your pediatrician regarding the use of this medicine in children. Special care may be needed. Overdosage: If you think you have taken too much of this medicine contact a poison control center or emergency room at once. NOTE: This medicine is only for you. Do not share this medicine with others. What if I miss a dose? It is important not to miss your dose. Call your doctor or health care professional if you are unable to keep an appointment. What may interact with this medicine? -cyclosporine -erythromycin -ketoconazole -medicines to increase blood counts like filgrastim, pegfilgrastim, sargramostim -vaccines Talk to your doctor or health care professional before taking any of these medicines: -acetaminophen -aspirin -ibuprofen -ketoprofen -naproxen This list may not describe all possible interactions. Give your health care provider a list of all the medicines, herbs, non-prescription drugs, or dietary supplements you use. Also tell them if you smoke, drink alcohol, or use illegal drugs. Some items may interact with your medicine. What should I watch for while using this medicine? Your condition will be monitored carefully while you are receiving this medicine. You will need important blood work done while you are taking this medicine. This drug may make you feel generally unwell. This is not uncommon, as chemotherapy can affect healthy cells as well as cancer cells. Report any  side effects. Continue your course of treatment even though you feel ill unless your doctor tells you to stop. In some cases, you may be given additional medicines to help with  side effects. Follow all directions for their use. Call your doctor or health care professional for advice if you get a fever, chills or sore throat, or other symptoms of a cold or flu. Do not treat yourself. This drug decreases your body's ability to fight infections. Try to avoid being around people who are sick. This medicine may increase your risk to bruise or bleed. Call your doctor or health care professional if you notice any unusual bleeding. This medicine may contain alcohol in the product. You may get drowsy or dizzy. Do not drive, use machinery, or do anything that needs mental alertness until you know how this medicine affects you. Do not stand or sit up quickly, especially if you are an older patient. This reduces the risk of dizzy or fainting spells. Avoid alcoholic drinks. Do not become pregnant while taking this medicine. Women should inform their doctor if they wish to become pregnant or think they might be pregnant. There is a potential for serious side effects to an unborn child. Talk to your health care professional or pharmacist for more information. Do not breast-feed an infant while taking this medicine. What side effects may I notice from receiving this medicine? Side effects that you should report to your doctor or health care professional as soon as possible: -allergic reactions like skin rash, itching or hives, swelling of the face, lips, or tongue -low blood counts - This drug may decrease the number of white blood cells, red blood cells and platelets. You may be at increased risk for infections and bleeding. -signs of infection - fever or chills, cough, sore throat, pain or difficulty passing urine -signs of decreased platelets or bleeding - bruising, pinpoint red spots on the skin, black, tarry stools, nosebleeds -signs of decreased red blood cells - unusually weak or tired, fainting spells, lightheadedness -breathing problems -fast or irregular heartbeat -low blood  pressure -mouth sores -nausea and vomiting -pain, swelling, redness or irritation at the injection site -pain, tingling, numbness in the hands or feet -swelling of the ankle, feet, hands -weight gain Side effects that usually do not require medical attention (report to your prescriber or health care professional if they continue or are bothersome): -bone pain -complete hair loss including hair on your head, underarms, pubic hair, eyebrows, and eyelashes -diarrhea -excessive tearing -changes in the color of fingernails -loosening of the fingernails -nausea -muscle pain -red flush to skin -sweating -weak or tired This list may not describe all possible side effects. Call your doctor for medical advice about side effects. You may report side effects to FDA at 1-800-FDA-1088. Where should I keep my medicine? This drug is given in a hospital or clinic and will not be stored at home. NOTE: This sheet is a summary. It may not cover all possible information. If you have questions about this medicine, talk to your doctor, pharmacist, or health care provider.    2016, [employer]. (2014-01-11 16:04:57)   Carboplatin injection What is this medicine? CARBOPLATIN (KAR boe pla tin) is a chemotherapy drug. It targets fast dividing cells, like cancer cells, and causes these cells to die. This medicine is used to treat ovarian cancer and many other cancers. This medicine may be used for other purposes; ask your health care provider or pharmacist if you have questions. What should I  tell my health care provider before I take this medicine? They need to know if you have any of these conditions: -blood disorders -hearing problems -kidney disease -recent or ongoing radiation therapy -an unusual or allergic reaction to carboplatin, cisplatin, other chemotherapy, other medicines, foods, dyes, or preservatives -pregnant or trying to get pregnant -breast-feeding How should I use this  medicine? This drug is usually given as an infusion into a vein. It is administered in a hospital or clinic by a specially trained health care professional. Talk to your pediatrician regarding the use of this medicine in children. Special care may be needed. Overdosage: If you think you have taken too much of this medicine contact a poison control center or emergency room at once. NOTE: This medicine is only for you. Do not share this medicine with others. What if I miss a dose? It is important not to miss a dose. Call your doctor or health care professional if you are unable to keep an appointment. What may interact with this medicine? -medicines for seizures -medicines to increase blood counts like filgrastim, pegfilgrastim, sargramostim -some antibiotics like amikacin, gentamicin, neomycin, streptomycin, tobramycin -vaccines Talk to your doctor or health care professional before taking any of these medicines: -acetaminophen -aspirin -ibuprofen -ketoprofen -naproxen This list may not describe all possible interactions. Give your health care provider a list of all the medicines, herbs, non-prescription drugs, or dietary supplements you use. Also tell them if you smoke, drink alcohol, or use illegal drugs. Some items may interact with your medicine. What should I watch for while using this medicine? Your condition will be monitored carefully while you are receiving this medicine. You will need important blood work done while you are taking this medicine. This drug may make you feel generally unwell. This is not uncommon, as chemotherapy can affect healthy cells as well as cancer cells. Report any side effects. Continue your course of treatment even though you feel ill unless your doctor tells you to stop. In some cases, you may be given additional medicines to help with side effects. Follow all directions for their use. Call your doctor or health care professional for advice if you get a  fever, chills or sore throat, or other symptoms of a cold or flu. Do not treat yourself. This drug decreases your body's ability to fight infections. Try to avoid being around people who are sick. This medicine may increase your risk to bruise or bleed. Call your doctor or health care professional if you notice any unusual bleeding. Be careful brushing and flossing your teeth or using a toothpick because you may get an infection or bleed more easily. If you have any dental work done, tell your dentist you are receiving this medicine. Avoid taking products that contain aspirin, acetaminophen, ibuprofen, naproxen, or ketoprofen unless instructed by your doctor. These medicines may hide a fever. Do not become pregnant while taking this medicine. Women should inform their doctor if they wish to become pregnant or think they might be pregnant. There is a potential for serious side effects to an unborn child. Talk to your health care professional or pharmacist for more information. Do not breast-feed an infant while taking this medicine. What side effects may I notice from receiving this medicine? Side effects that you should report to your doctor or health care professional as soon as possible: -allergic reactions like skin rash, itching or hives, swelling of the face, lips, or tongue -signs of infection - fever or chills, cough, sore throat, pain  or difficulty passing urine -signs of decreased platelets or bleeding - bruising, pinpoint red spots on the skin, black, tarry stools, nosebleeds -signs of decreased red blood cells - unusually weak or tired, fainting spells, lightheadedness -breathing problems -changes in hearing -changes in vision -chest pain -high blood pressure -low blood counts - This drug may decrease the number of white blood cells, red blood cells and platelets. You may be at increased risk for infections and bleeding. -nausea and vomiting -pain, swelling, redness or irritation at the  injection site -pain, tingling, numbness in the hands or feet -problems with balance, talking, walking -trouble passing urine or change in the amount of urine Side effects that usually do not require medical attention (report to your doctor or health care professional if they continue or are bothersome): -hair loss -loss of appetite -metallic taste in the mouth or changes in taste This list may not describe all possible side effects. Call your doctor for medical advice about side effects. You may report side effects to FDA at 1-800-FDA-1088. Where should I keep my medicine? This drug is given in a hospital or clinic and will not be stored at home. NOTE: This sheet is a summary. It may not cover all possible information. If you have questions about this medicine, talk to your doctor, pharmacist, or health care provider.    2016, [employer] Standard. (2007-04-01 14:38:05)   Trastuzumab injection for infusion What is this medicine? TRASTUZUMAB (tras TOO zoo mab) is a monoclonal antibody. It is used to treat breast cancer and stomach cancer. This medicine may be used for other purposes; ask your health care provider or pharmacist if you have questions. What should I tell my health care provider before I take this medicine? They need to know if you have any of these conditions: -heart disease -heart failure -infection (especially a virus infection such as chickenpox, cold sores, or herpes) -lung or breathing disease, like asthma -recent or ongoing radiation therapy -an unusual or allergic reaction to trastuzumab, benzyl alcohol, or other medications, foods, dyes, or preservatives -pregnant or trying to get pregnant -breast-feeding How should I use this medicine? This drug is given as an infusion into a vein. It is administered in a hospital or clinic by a specially trained health care professional. Talk to your pediatrician regarding the use of this medicine in children. This medicine  is not approved for use in children. Overdosage: If you think you have taken too much of this medicine contact a poison control center or emergency room at once. NOTE: This medicine is only for you. Do not share this medicine with others. What if I miss a dose? It is important not to miss a dose. Call your doctor or health care professional if you are unable to keep an appointment. What may interact with this medicine? -doxorubicin -warfarin This list may not describe all possible interactions. Give your health care provider a list of all the medicines, herbs, non-prescription drugs, or dietary supplements you use. Also tell them if you smoke, drink alcohol, or use illegal drugs. Some items may interact with your medicine. What should I watch for while using this medicine? Visit your doctor for checks on your progress. Report any side effects. Continue your course of treatment even though you feel ill unless your doctor tells you to stop. Call your doctor or health care professional for advice if you get a fever, chills or sore throat, or other symptoms of a cold or flu. Do not treat yourself. Try  to avoid being around people who are sick. You may experience fever, chills and shaking during your first infusion. These effects are usually mild and can be treated with other medicines. Report any side effects during the infusion to your health care professional. Fever and chills usually do not happen with later infusions. Do not become pregnant while taking this medicine or for 7 months after stopping it. Women should inform their doctor if they wish to become pregnant or think they might be pregnant. Women of child-bearing potential will need to have a negative pregnancy test before starting this medicine. There is a potential for serious side effects to an unborn child. Talk to your health care professional or pharmacist for more information. Do not breast-feed an infant while taking this medicine or for  7 months after stopping it. Women must use effective birth control with this medicine. What side effects may I notice from receiving this medicine? Side effects that you should report to your doctor or other health care professional as soon as possible: -breathing difficulties -chest pain or palpitations -cough -dizziness or fainting -fever or chills, sore throat -skin rash, itching or hives -swelling of the legs or ankles -unusually weak or tired Side effects that usually do not require medical attention (report to your doctor or other health care professional if they continue or are bothersome): -loss of appetite -headache -muscle aches -nausea This list may not describe all possible side effects. Call your doctor for medical advice about side effects. You may report side effects to FDA at 1-800-FDA-1088. Where should I keep my medicine? This drug is given in a hospital or clinic and will not be stored at home. NOTE: This sheet is a summary. It may not cover all possible information. If you have questions about this medicine, talk to your doctor, pharmacist, or health care provider.    2016, [employer] Standard. (2014-04-02 11:49:32)   Pertuzumab injection What is this medicine? PERTUZUMAB (per TOOZ ue mab) is a monoclonal antibody. It is used to treat breast cancer. This medicine may be used for other purposes; ask your health care provider or pharmacist if you have questions. What should I tell my health care provider before I take this medicine? They need to know if you have any of these conditions: -heart disease -heart failure -high blood pressure -history of irregular heart beat -recent or ongoing radiation therapy -an unusual or allergic reaction to pertuzumab, other medicines, foods, dyes, or preservatives -pregnant or trying to get pregnant -breast-feeding How should I use this medicine? This medicine is for infusion into a vein. It is given by a health care  professional in a hospital or clinic setting. Talk to your pediatrician regarding the use of this medicine in children. Special care may be needed. Overdosage: If you think you have taken too much of this medicine contact a poison control center or emergency room at once. NOTE: This medicine is only for you. Do not share this medicine with others. What if I miss a dose? It is important not to miss your dose. Call your doctor or health care professional if you are unable to keep an appointment. What may interact with this medicine? Interactions are not expected. Give your health care provider a list of all the medicines, herbs, non-prescription drugs, or dietary supplements you use. Also tell them if you smoke, drink alcohol, or use illegal drugs. Some items may interact with your medicine. This list may not describe all possible interactions. Give your health care  provider a list of all the medicines, herbs, non-prescription drugs, or dietary supplements you use. Also tell them if you smoke, drink alcohol, or use illegal drugs. Some items may interact with your medicine. What should I watch for while using this medicine? Your condition will be monitored carefully while you are receiving this medicine. Report any side effects. Continue your course of treatment even though you feel ill unless your doctor tells you to stop. Do not become pregnant while taking this medicine or for 7 months after stopping it. Women should inform their doctor if they wish to become pregnant or think they might be pregnant. Women of child-bearing potential will need to have a negative pregnancy test before starting this medicine. There is a potential for serious side effects to an unborn child. Talk to your health care professional or pharmacist for more information. Do not breast-feed an infant while taking this medicine or for 7 months after stopping it. Women must use effective birth control with this medicine. Call your  doctor or health care professional for advice if you get a fever, chills or sore throat, or other symptoms of a cold or flu. Do not treat yourself. Try to avoid being around people who are sick. You may experience fever, chills, and headache during the infusion. Report any side effects during the infusion to your health care professional. What side effects may I notice from receiving this medicine? Side effects that you should report to your doctor or health care professional as soon as possible: -breathing problems -chest pain or palpitations -dizziness -feeling faint or lightheaded -fever or chills -skin rash, itching or hives -sore throat -swelling of the face, lips, or tongue -swelling of the legs or ankles -unusually weak or tired Side effects that usually do not require medical attention (Report these to your doctor or health care professional if they continue or are bothersome.): -diarrhea -hair loss -nausea, vomiting -tiredness This list may not describe all possible side effects. Call your doctor for medical advice about side effects. You may report side effects to FDA at 1-800-FDA-1088. Where should I keep my medicine? This drug is given in a hospital or clinic and will not be stored at home. NOTE: This sheet is a summary. It may not cover all possible information. If you have questions about this medicine, talk to your doctor, pharmacist, or health care provider.    2016, [employer] Standard. (2014-04-02 16:07:57)

## 2015-07-25 NOTE — Assessment & Plan Note (Addendum)
Left simple mastectomy with reconstruction 06/24/2015: IDC grade 3, 2.5 cm, with high-grade DCIS, ALH, LVI present, margins negative, 0/1 sentinel node negative, , ER 100%, PR 5%, HER-2 positive ratio 1.56 with average copy #8.25, Ki-67 20%  Pathologic stage:T2 N0 stage II a Treatment plan: 1. Adjuvant chemotherapy with TCHP followed by Herceptin maintenance for 1 year 2. Followed by adjuvant hormonal therapy ---------------------------------------------------------------------------------------------------------- Current treatment: Cycle 1 day 1 TCH Perjeta Antiemetics were reviewed Labs are reviewed Echocardiogram scheduled for 07/27/2015 We will monitor her very closely for toxicities. Return to clinic in 1 week for toxicity check.

## 2015-07-25 NOTE — Progress Notes (Signed)
Patient Care Team: Terald Sleeper, PA as PCP - General ([place]) Sylvan Cheese, NP as Nurse Practitioner (Hematology and Oncology)  DIAGNOSIS: Breast cancer of upper-inner quadrant of left female breast [facility])   Staging form: Breast, AJCC 7th Edition     Clinical stage from 06/08/2015: Stage IA (T1c, N0, M0) - Unsigned       Staging comments: Staged at breast conference on 5.31.17    SUMMARY OF ONCOLOGIC HISTORY:   Breast cancer of upper-inner quadrant of left female breast ([place])   05/26/2015 Initial Diagnosis Screen detected left breast asymmetry (posterior 1.6 cm): Grade 2-3 IDC ER/PR positive HER-2 positive Ki-67 20% plus calcs (span 6.1 cm): High-grade DCIS with suspicious foci of invasion (4.2 cm apart)   06/24/2015 Surgery Left simple mastectomy with reconstruction: IDC grade 3, 2.5 cm, with high-grade DCIS, ALH, LVI present, margins negative, 0/1 sentinel node negative, T2 N0 stage II a, ER 100%, PR 5%, HER-2 positive ratio 1.56 with average copy #8.25, Ki-67 20%    CHIEF COMPLIANT: Cycle1 D1 TCH [facility]  INTERVAL HISTORY: Heidi Walls is a 60 year old with above-mentioned history of left breast cancer treated with mastectomy and is currently starting adjuvant chemotherapy with TCH [facility]. Today cycle 1 day 1. She is anxious to get started. She has healed well from prior mastectomy.  REVIEW OF SYSTEMS:   Constitutional: Denies fevers, chills or abnormal weight loss Eyes: Denies blurriness of vision Ears, nose, mouth, throat, and face: Denies mucositis or sore throat Respiratory: Denies cough, dyspnea or wheezes Cardiovascular: Denies palpitation, chest discomfort Gastrointestinal:  Denies nausea, heartburn or change in bowel habits Skin: Denies abnormal skin rashes Lymphatics: Denies new lymphadenopathy or easy bruising Neurological:Denies numbness, tingling or new weaknesses Behavioral/Psych: Mood is stable, no new changes  Extremities: No lower extremity  edema Breast:  Prior mastectomy on the left All other systems were reviewed with the patient and are negative.  I have reviewed the past medical history, past surgical history, social history and family history with the patient and they are unchanged from previous note.  ALLERGIES:  is allergic to codeine.  MEDICATIONS:  Current Outpatient Prescriptions  Medication Sig Dispense Refill  . alendronate (FOSAMAX) 70 MG tablet   8  . ALPRAZolam (XANAX) 0.25 MG tablet   2  . ARIPiprazole (ABILIFY) 2 MG tablet Take 2 mg by mouth daily.    . Ascorbic Acid (VITAMIN C) 1000 MG tablet Take 1,000 mg by mouth daily.    . Biotin (BIOTIN 5000) 5 MG CAPS Take 5,000 mg by mouth daily.    . citalopram (CELEXA) 20 MG tablet Take 20 mg by mouth every morning.    . cyclobenzaprine (FLEXERIL) 10 MG tablet TAKE 1 TABLET (ORAL) 3 TIMES PER DAY FOR 10 DAYS  1  . dexamethasone (DECADRON) 4 MG tablet Take 1 tablet (4 mg total) by mouth daily. Start the day before Taxotere. Then again the day after chemo 30 tablet 1  . esomeprazole (NEXIUM) 20 MG capsule Take 20 mg by mouth daily at 12 noon.    . hydrochlorothiazide (HYDRODIURIL) 12.5 MG tablet Take 12.5 mg by mouth daily.    Marland Kitchen lidocaine-prilocaine (EMLA) cream Apply to affected area once 30 g 3  . LORazepam (ATIVAN) 0.5 MG tablet Take 1 tablet (0.5 mg total) by mouth every 6 (six) hours as needed (Nausea or vomiting). 30 tablet 0  . magnesium gluconate (MAGONATE) 500 MG tablet Take 500 mg by mouth daily.    . MELOXICAM  PO Take 7.5 mg by mouth.     . Multiple Vitamin (MULTIVITAMIN) tablet Take 1 tablet by mouth daily.    . Omega-3 Fatty Acids (FISH OIL) 1200 MG CAPS Take 1,200 mg by mouth daily.    . ondansetron (ZOFRAN) 8 MG tablet Take 1 tablet (8 mg total) by mouth 2 (two) times daily as needed for refractory nausea / vomiting. Start on day 3 after chemo. 30 tablet 1  . prochlorperazine (COMPAZINE) 10 MG tablet Take 1 tablet (10 mg total) by mouth every 6 (six)  hours as needed (Nausea or vomiting). 30 tablet 1   No current facility-administered medications for this visit.    PHYSICAL EXAMINATION: ECOG PERFORMANCE STATUS: 0 - Asymptomatic  Filed Vitals:   07/25/15 0905  BP: 154/61  Pulse: 75  Temp: 98.2 F (36.8 C)  Resp: 18   Filed Weights   07/25/15 0905  Weight: 155 lb 14.4 oz (70.716 kg)    GENERAL:alert, no distress and comfortable SKIN: skin color, texture, turgor are normal, no rashes or significant lesions EYES: normal, Conjunctiva are pink and non-injected, sclera clear OROPHARYNX:no exudate, no erythema and lips, buccal mucosa, and tongue normal  NECK: supple, thyroid normal size, non-tender, without nodularity LYMPH:  no palpable lymphadenopathy in the cervical, axillary or inguinal LUNGS: clear to auscultation and percussion with normal breathing effort HEART: regular rate & rhythm and no murmurs and no lower extremity edema ABDOMEN:abdomen soft, non-tender and normal bowel sounds MUSCULOSKELETAL:no cyanosis of digits and no clubbing  NEURO: alert & oriented x 3 with fluent speech, no focal motor/sensory deficits EXTREMITIES: No lower extremity edema  LABORATORY DATA:  I have reviewed the data as listed   Chemistry      Component Value Date/Time   NA 138 07/25/2015 0842   K 3.5 07/25/2015 0842   CO2 27 07/25/2015 0842   BUN 16.4 07/25/2015 0842   CREATININE 0.8 07/25/2015 0842      Component Value Date/Time   CALCIUM 9.7 07/25/2015 0842   ALKPHOS 82 07/25/2015 0842   AST 25 07/25/2015 0842   ALT 33 07/25/2015 0842   BILITOT <0.30 07/25/2015 0842       Lab Results  Component Value Date   WBC 11.5* 07/25/2015   HGB 11.2* 07/25/2015   HCT 34.8 07/25/2015   MCV 84.7 07/25/2015   PLT 315 07/25/2015   NEUTROABS 8.8* 07/25/2015     ASSESSMENT & PLAN:  Breast cancer of upper-inner quadrant of left female breast ([place]) Left simple mastectomy with reconstruction 06/24/2015: IDC grade 3, 2.5 cm, with  high-grade DCIS, ALH, LVI present, margins negative, 0/1 sentinel node negative, , ER 100%, PR 5%, HER-2 positive ratio 1.56 with average copy #8.25, Ki-67 20%  Pathologic stage:T2 N0 stage II a Treatment plan: 1. Adjuvant chemotherapy with TCHP followed by Herceptin maintenance for 1 year 2. Followed by adjuvant hormonal therapy ---------------------------------------------------------------------------------------------------------- Current treatment: Cycle 1 day 1 TCH [facility] Antiemetics were reviewed Labs are reviewed Echocardiogram scheduled for 07/27/2015 Okay to treat because she has had an echocardiogram before surgery and that was normal. We will monitor her very closely for toxicities. Return to clinic in 1 week for toxicity check.   No orders of the defined types were placed in this encounter.   The patient has a good understanding of the overall plan. she agrees with it. she will call with any problems that may develop before the next visit here.   Rulon Eisenmenger, MD 07/25/2015

## 2015-07-26 ENCOUNTER — Other Ambulatory Visit: Payer: Self-pay | Admitting: *Deleted

## 2015-07-26 ENCOUNTER — Telehealth: Payer: Self-pay | Admitting: *Deleted

## 2015-07-26 NOTE — Telephone Encounter (Signed)
-----   Message from Otila Kluver, RN sent at 07/25/2015  4:55 PM EDT ----- Regarding: [place] 1st time [facility] Contact: [phone] 1st time TCHP. Tolerated well. No evidence of reaction during treatment. Reviewed discharge instructions with patient. She voiced understanding. On Pro placed.

## 2015-07-26 NOTE — Telephone Encounter (Signed)
Called Heidi Walls for chemotherapy F/U.  Patient is doing well.  Reports she has walked today.  Has "eaten two meals already due to steroids".  Denies n/v.  Denies any new side effects or symptoms.  Bowel and bladder is functioning well.  Has had "three stools today but it's not diarrhea."  Eating and drinking well and I instructed to drink 64 oz minimum daily or at least the day before, of and after treatment.  Denies questions at this time and encouraged to call if needed.  Reviewed how to call after hours in the case of an emergency.

## 2015-07-27 ENCOUNTER — Ambulatory Visit (HOSPITAL_COMMUNITY)
Admission: RE | Admit: 2015-07-27 | Discharge: 2015-07-27 | Disposition: A | Discharge status: Home / Self Care | Payer: BLUE CROSS/BLUE SHIELD | Source: Ambulatory Visit | Attending: Cardiology | Admitting: Cardiology

## 2015-07-27 ENCOUNTER — Ambulatory Visit (HOSPITAL_BASED_OUTPATIENT_CLINIC_OR_DEPARTMENT_OTHER)
Admission: RE | Admit: 2015-07-27 | Discharge: 2015-07-27 | Disposition: A | Discharge status: Home / Self Care | Payer: BLUE CROSS/BLUE SHIELD | Source: Ambulatory Visit | Attending: Cardiology | Admitting: Cardiology

## 2015-07-27 ENCOUNTER — Encounter (HOSPITAL_COMMUNITY): Payer: Self-pay

## 2015-07-27 VITALS — BP 126/78 | HR 78 | Wt 154.5 lb

## 2015-07-27 DIAGNOSIS — Z87891 Personal history of nicotine dependence: Secondary | ICD-10-CM | POA: Insufficient documentation

## 2015-07-27 DIAGNOSIS — C50212 Malignant neoplasm of upper-inner quadrant of left female breast: Secondary | ICD-10-CM | POA: Diagnosis not present

## 2015-07-27 DIAGNOSIS — I5189 Other ill-defined heart diseases: Secondary | ICD-10-CM | POA: Insufficient documentation

## 2015-07-27 DIAGNOSIS — C50912 Malignant neoplasm of unspecified site of left female breast: Secondary | ICD-10-CM | POA: Diagnosis not present

## 2015-07-27 DIAGNOSIS — Z01810 Encounter for preprocedural cardiovascular examination: Secondary | ICD-10-CM | POA: Diagnosis not present

## 2015-07-27 NOTE — Progress Notes (Signed)
Patient ID: Heidi Walls, female   DOB: 1955/06/05, 60 y.o.   MRN: [id number] Oncologist: Dr. Lindi Adie  60 yo with history of HTN, GERD, and breast cancer presents for cardio-oncology clinic evaluation.  She had left mastectomy in 6/17.  ER+/PR+/HER2+ tumor.  She will have TCHP adjuvant chemo followed by Herceptin alone for total of 1 year Herceptin-based therapy. She has had her 1st cycle of TCHP.    She has no history of cardiac problems.  She is not a smoker.  No exertional dyspnea or chest pain.   PMH: 1. HTN 2. GERD 3. Breast cancer: Left mastectomy in 6/17.  ER+/PR+/HER2+ tumor.  She will have TCHP adjuvant chemo followed by Herceptin alone for total of 1 year Herceptin-based therapy.   - Echo (7/17): EF 60-65%, lateral s' 12.4, GLS -23.7%, grade II diastolic dysfunction.   Social History   Social History  . Marital Status: Married    Spouse Name: N/A  . Number of Children: N/A  . Years of Education: N/A   Occupational History  . Not on file.   Social History Main Topics  . Smoking status: Former Research scientist (life sciences)  . Smokeless tobacco: Never Used  . Alcohol Use: Yes     Comment: social  . Drug Use: No  . Sexual Activity: Not on file   Other Topics Concern  . Not on file   Social History Narrative   Family History  Problem Relation Age of Onset  . Heart failure Mother   . Diabetes Mother   . Hypertension Mother   . Hypertension Father   . Breast cancer Maternal Grandmother    ROS: All systems reviewed and negative except as per HPI.   Current Outpatient Prescriptions  Medication Sig Dispense Refill  . alendronate (FOSAMAX) 70 MG tablet   8  . ALPRAZolam (XANAX) 0.25 MG tablet   2  . ARIPiprazole (ABILIFY) 2 MG tablet Take 2 mg by mouth daily.    . Ascorbic Acid (VITAMIN C) 1000 MG tablet Take 1,000 mg by mouth daily.    . Biotin (BIOTIN 5000) 5 MG CAPS Take 5,000 mg by mouth daily.    . citalopram (CELEXA) 20 MG tablet Take 20 mg by mouth every morning.    .  cyclobenzaprine (FLEXERIL) 10 MG tablet prn  1  . dexamethasone (DECADRON) 4 MG tablet Take 1 tablet (4 mg total) by mouth daily. Start the day before Taxotere. Then again the day after chemo 30 tablet 1  . esomeprazole (NEXIUM) 20 MG capsule Take 20 mg by mouth daily at 12 noon.    . hydrochlorothiazide (HYDRODIURIL) 12.5 MG tablet Take 12.5 mg by mouth daily.    Marland Kitchen lidocaine-prilocaine (EMLA) cream Apply to affected area once 30 g 3  . LORazepam (ATIVAN) 0.5 MG tablet Take 1 tablet (0.5 mg total) by mouth every 6 (six) hours as needed (Nausea or vomiting). 30 tablet 0  . MELOXICAM PO Take 7.5 mg by mouth.     . Multiple Vitamin (MULTIVITAMIN) tablet Take 1 tablet by mouth daily.    . Omega-3 Fatty Acids (FISH OIL) 1200 MG CAPS Take 1,200 mg by mouth daily.    . ondansetron (ZOFRAN) 8 MG tablet Take 1 tablet (8 mg total) by mouth 2 (two) times daily as needed for refractory nausea / vomiting. Start on day 3 after chemo. 30 tablet 1  . prochlorperazine (COMPAZINE) 10 MG tablet Take 1 tablet (10 mg total) by mouth every 6 (six) hours as needed (Nausea  or vomiting). 30 tablet 1   No current facility-administered medications for this encounter.   BP 126/78 mmHg  Pulse 78  Wt 154 lb 8 oz (70.081 kg)  SpO2 100% General: NAD Neck: No JVD, no thyromegaly or thyroid nodule.  Lungs: Clear to auscultation bilaterally with normal respiratory effort. CV: Nondisplaced PMI.  Heart regular S1/S2, no S3/S4, no murmur.  No peripheral edema.  No carotid bruit.  Normal pedal pulses.  Abdomen: Soft, nontender, no hepatosplenomegaly, no distention.  Skin: Intact without lesions or rashes.  Neurologic: Alert and oriented x 3.  Psych: Normal affect. Extremities: No clubbing or cyanosis.  HEENT: Normal.   Assessment/Plan: 1. Breast cancer: Rationale behind echo screening in the setting of Herceptin therapy was reviewed.  Echo from today was reviewed, EF is normal with normal strain pattern.   - She will return  for echo and office visit in 3 months.  2. HTN: BP controlled.   Loralie Champagne 07/27/2015

## 2015-07-27 NOTE — Progress Notes (Signed)
*  PRELIMINARY RESULTS* Echocardiogram 2D Echocardiogram has been performed.  Leavy Cella 07/27/2015, 11:10 AM

## 2015-07-27 NOTE — Patient Instructions (Signed)
Follow up and Echo in 3 months with Dr.McLean  

## 2015-08-01 ENCOUNTER — Other Ambulatory Visit (HOSPITAL_BASED_OUTPATIENT_CLINIC_OR_DEPARTMENT_OTHER): Payer: BLUE CROSS/BLUE SHIELD

## 2015-08-01 ENCOUNTER — Encounter: Payer: Self-pay | Admitting: Hematology and Oncology

## 2015-08-01 ENCOUNTER — Ambulatory Visit (HOSPITAL_BASED_OUTPATIENT_CLINIC_OR_DEPARTMENT_OTHER): Payer: BLUE CROSS/BLUE SHIELD | Admitting: Hematology and Oncology

## 2015-08-01 DIAGNOSIS — R05 Cough: Secondary | ICD-10-CM | POA: Diagnosis not present

## 2015-08-01 DIAGNOSIS — C50212 Malignant neoplasm of upper-inner quadrant of left female breast: Secondary | ICD-10-CM

## 2015-08-01 DIAGNOSIS — R110 Nausea: Secondary | ICD-10-CM

## 2015-08-01 DIAGNOSIS — R530 Neoplastic (malignant) related fatigue: Secondary | ICD-10-CM

## 2015-08-01 DIAGNOSIS — B370 Candidal stomatitis: Secondary | ICD-10-CM | POA: Diagnosis not present

## 2015-08-01 LAB — COMPREHENSIVE METABOLIC PANEL
ALK PHOS: 124 U/L (ref 40–150)
ALT: 63 U/L — ABNORMAL HIGH (ref 0–55)
ANION GAP: 14 meq/L — AB (ref 3–11)
AST: 37 U/L — ABNORMAL HIGH (ref 5–34)
Albumin: 4.1 g/dL (ref 3.5–5.0)
BUN: 15 mg/dL (ref 7.0–26.0)
CALCIUM: 10.1 mg/dL (ref 8.4–10.4)
CO2: 27 meq/L (ref 22–29)
Chloride: 95 mEq/L — ABNORMAL LOW (ref 98–109)
Creatinine: 1 mg/dL (ref 0.6–1.1)
EGFR: 63 mL/min/{1.73_m2} — AB (ref >90)
Glucose: 115 mg/dl (ref 70–140)
Potassium: 3.5 mEq/L (ref 3.5–5.1)
Sodium: 136 mEq/L (ref 136–145)
TOTAL PROTEIN: 8.1 g/dL (ref 6.4–8.3)

## 2015-08-01 LAB — CBC WITH DIFFERENTIAL/PLATELET
BASO%: 0.2 % (ref 0.0–2.0)
Basophils Absolute: 0 10*3/uL (ref 0.0–0.1)
EOS%: 0.2 % (ref 0.0–7.0)
Eosinophils Absolute: 0 10*3/uL (ref 0.0–0.5)
HCT: 37.3 % (ref 34.8–46.6)
HGB: 12.1 g/dL (ref 11.6–15.9)
LYMPH%: 13.3 % — AB (ref 14.0–49.7)
MCH: 27.1 pg (ref 25.1–34.0)
MCHC: 32.4 g/dL (ref 31.5–36.0)
MCV: 83.5 fL (ref 79.5–101.0)
MONO#: 2.2 10*3/uL — AB (ref 0.1–0.9)
MONO%: 13.8 % (ref 0.0–14.0)
NEUT#: 11.3 10*3/uL — ABNORMAL HIGH (ref 1.5–6.5)
NEUT%: 72.5 % (ref 38.4–76.8)
PLATELETS: 245 10*3/uL (ref 145–400)
RBC: 4.46 10*6/uL (ref 3.70–5.45)
RDW: 13.2 % (ref 11.2–14.5)
WBC: 15.6 10*3/uL — ABNORMAL HIGH (ref 3.9–10.3)
lymph#: 2.1 10*3/uL (ref 0.9–3.3)

## 2015-08-01 MED ORDER — FLUCONAZOLE 100 MG PO TABS: 100.0000 mg | ORAL_TABLET | Freq: Every day | ORAL | 0 refills | Status: DC

## 2015-08-01 NOTE — Progress Notes (Signed)
Patient Care Team: Terald Sleeper, PA as PCP - General ([place]) Sylvan Cheese, NP as Nurse Practitioner (Hematology and Oncology)  DIAGNOSIS: Breast cancer of upper-inner quadrant of left female breast [facility])   Staging form: Breast, AJCC 7th Edition   - Clinical stage from 06/08/2015: Stage IA (T1c, N0, M0) - Unsigned         Staging comments: Staged at breast conference on 5.31.17  SUMMARY OF ONCOLOGIC HISTORY:   Breast cancer of upper-inner quadrant of left female breast ([place])   05/26/2015 Initial Diagnosis    Screen detected left breast asymmetry (posterior 1.6 cm): Grade 2-3 IDC ER/PR positive HER-2 positive Ki-67 20% plus calcs (span 6.1 cm): High-grade DCIS with suspicious foci of invasion (4.2 cm apart)     06/24/2015 Surgery    Left simple mastectomy with reconstruction: IDC grade 3, 2.5 cm, with high-grade DCIS, ALH, LVI present, margins negative, 0/1 sentinel node negative, T2 N0 stage II a, ER 100%, PR 5%, HER-2 positive ratio 1.56 with average copy #8.25, Ki-67 20%     07/25/2015 -  Chemotherapy    Adjuvant chemotherapy with TCH [facility] 6 cycles followed by Herceptin maintenance for 1 year      CHIEF COMPLIANT: Cycle 1 day 8 T[facility]  INTERVAL HISTORY: Heidi Walls is a 60 year old with above-mentioned history left breast cancer currently on adjuvant chemotherapy with TCH [facility]. She is here for toxicity check. She complains of mouth sores and whitish patches in the mouth which are making it very difficult for her to hit anything. She has been trying to drink some boost and water. She has lost about 5 pounds since last week. She also has a dry cough. She has had some ovarian suppression-like symptoms like hot flashes. She denied any vomiting but she did have nausea for a couple of days. Her major issue was severe fatigue that was on day 3 and day 4. She had one episode of diarrhea for which on Imodium took care of it. She had no further problems from  diarrhea. She has extremely poor appetite and the foods that she used to like do not taste the same.  REVIEW OF SYSTEMS:   Constitutional: Denies fevers, chills or abnormal weight loss Eyes: Denies blurriness of vision Ears, nose, mouth, throat, and face: Impression the mouth Respiratory: Denies cough, dyspnea or wheezes Cardiovascular: Denies palpitation, chest discomfort Gastrointestinal:  Complains of nausea Skin: Denies abnormal skin rashes Lymphatics: Denies new lymphadenopathy or easy bruising Neurological:Denies numbness, tingling or new weaknesses Behavioral/Psych: Mood is stable, no new changes  Extremities: No lower extremity edema  All other systems were reviewed with the patient and are negative.  I have reviewed the past medical history, past surgical history, social history and family history with the patient and they are unchanged from previous note.  ALLERGIES:  is allergic to codeine.  MEDICATIONS:  Current Outpatient Prescriptions  Medication Sig Dispense Refill  . alendronate (FOSAMAX) 70 MG tablet   8  . ALPRAZolam (XANAX) 0.25 MG tablet   2  . ARIPiprazole (ABILIFY) 2 MG tablet Take 2 mg by mouth daily.    . Ascorbic Acid (VITAMIN C) 1000 MG tablet Take 1,000 mg by mouth daily.    . Biotin (BIOTIN 5000) 5 MG CAPS Take 5,000 mg by mouth daily.    . citalopram (CELEXA) 20 MG tablet Take 20 mg by mouth every morning.    . cyclobenzaprine (FLEXERIL) 10 MG tablet prn  1  . esomeprazole (  NEXIUM) 20 MG capsule Take 20 mg by mouth daily at 12 noon.    . fluconazole (DIFLUCAN) 100 MG tablet Take 1 tablet (100 mg total) by mouth daily. 14 tablet 0  . hydrochlorothiazide (HYDRODIURIL) 12.5 MG tablet Take 12.5 mg by mouth daily.    Marland Kitchen lidocaine-prilocaine (EMLA) cream Apply to affected area once 30 g 3  . LORazepam (ATIVAN) 0.5 MG tablet Take 1 tablet (0.5 mg total) by mouth every 6 (six) hours as needed (Nausea or vomiting). 30 tablet 0  . MELOXICAM PO Take 7.5 mg by  mouth.     . Multiple Vitamin (MULTIVITAMIN) tablet Take 1 tablet by mouth daily.    . Omega-3 Fatty Acids (FISH OIL) 1200 MG CAPS Take 1,200 mg by mouth daily.    . ondansetron (ZOFRAN) 8 MG tablet Take 1 tablet (8 mg total) by mouth 2 (two) times daily as needed for refractory nausea / vomiting. Start on day 3 after chemo. 30 tablet 1  . prochlorperazine (COMPAZINE) 10 MG tablet Take 1 tablet (10 mg total) by mouth every 6 (six) hours as needed (Nausea or vomiting). 30 tablet 1   No current facility-administered medications for this visit.     PHYSICAL EXAMINATION: ECOG PERFORMANCE STATUS: 1 - Symptomatic but completely ambulatory  Vitals:   08/01/15 0843  BP: (!) 154/71  Pulse: (!) 121  Resp: 19  Temp: 98.2 F (36.8 C)   Filed Weights   08/01/15 0843  Weight: 149 lb 8 oz (67.8 kg)    GENERAL:alert, no distress and comfortable SKIN: skin color, texture, turgor are normal, no rashes or significant lesions EYES: normal, Conjunctiva are pink and non-injected, sclera clear OROPHARYNX:no exudate, no erythema and lips, buccal mucosa, and tongue normal  NECK: supple, thyroid normal size, non-tender, without nodularity LYMPH:  no palpable lymphadenopathy in the cervical, axillary or inguinal LUNGS: clear to auscultation and percussion with normal breathing effort HEART: regular rate & rhythm and no murmurs and no lower extremity edema ABDOMEN:abdomen soft, non-tender and normal bowel sounds MUSCULOSKELETAL:no cyanosis of digits and no clubbing  NEURO: alert & oriented x 3 with fluent speech, no focal motor/sensory deficits EXTREMITIES: No lower extremity edema  LABORATORY DATA:  I have reviewed the data as listed   Chemistry      Component Value Date/Time   NA 136 08/01/2015 0828   K 3.5 08/01/2015 0828   CO2 27 08/01/2015 0828   BUN 15.0 08/01/2015 0828   CREATININE 1.0 08/01/2015 0828      Component Value Date/Time   CALCIUM 10.1 08/01/2015 0828   ALKPHOS 124 08/01/2015  0828   AST 37 (H) 08/01/2015 0828   ALT 63 (H) 08/01/2015 0828   BILITOT <0.30 08/01/2015 0828       Lab Results  Component Value Date   WBC 15.6 (H) 08/01/2015   HGB 12.1 08/01/2015   HCT 37.3 08/01/2015   MCV 83.5 08/01/2015   PLT 245 08/01/2015   NEUTROABS 11.3 (H) 08/01/2015     ASSESSMENT & PLAN:  Breast cancer of upper-inner quadrant of left female breast ([place]) Left simple mastectomy with reconstruction 06/24/2015: IDC grade 3, 2.5 cm, with high-grade DCIS, ALH, LVI present, margins negative, 0/1 sentinel node negative, , ER 100%, PR 5%, HER-2 positive ratio 1.56 with average copy #8.25, Ki-67 20%  Pathologic stage:T2 N0 stage II a Treatment plan: 1. Adjuvant chemotherapy with TCHP followed by Herceptin maintenance for 1 year 2. Followed by adjuvant hormonal therapy ---------------------------------------------------------------------------------------------------------- Current treatment: Cycle 1  day 8 [place] [facility] Echocardiogram 07/27/2015 60-65%  Chemotherapy toxicities: 1. Nausea grade 1 2. Thrush: Prescribed Diflucan 3. Diarrhea grade 1 4. Dry cough: Probably related to fungal infection: Diflucan should help. 5. Severe fatigue  We are monitoring her very closely for toxicities. Return to clinic in 2 weeks for cycle 2   No orders of the defined types were placed in this encounter.  The patient has a good understanding of the overall plan. she agrees with it. she will call with any problems that may develop before the next visit here.   Rulon Eisenmenger, MD 08/01/15

## 2015-08-01 NOTE — Assessment & Plan Note (Signed)
Left simple mastectomy with reconstruction 06/24/2015: IDC grade 3, 2.5 cm, with high-grade DCIS, ALH, LVI present, margins negative, 0/1 sentinel node negative, , ER 100%, PR 5%, HER-2 positive ratio 1.56 with average copy #8.25, Ki-67 20%  Pathologic stage:T2 N0 stage II a Treatment plan: 1. Adjuvant chemotherapy with TCHP followed by Herceptin maintenance for 1 year 2. Followed by adjuvant hormonal therapy ---------------------------------------------------------------------------------------------------------- Current treatment: Cycle 1 day 8 TCH [facility] Echocardiogram 07/27/2015 60-65%  Chemotherapy toxicities:  We are monitoring her very closely for toxicities. Return to clinic in 2 weeks for cycle 2

## 2015-08-05 ENCOUNTER — Telehealth: Payer: Self-pay | Admitting: *Deleted

## 2015-08-05 NOTE — Telephone Encounter (Signed)
  Oncology Nurse Navigator Documentation    Navigator Encounter Type: Telephone (08/05/15 0800) Telephone: Incoming Call;Symptom Mgt (08/05/15 0800)  Discussed fluconazole and length of treatment.  Gave encouragement and emotional support. Denies further needs at this time.                                      Time Spent with Patient: 30 (08/05/15 0800)

## 2015-08-15 ENCOUNTER — Ambulatory Visit (HOSPITAL_BASED_OUTPATIENT_CLINIC_OR_DEPARTMENT_OTHER): Payer: BLUE CROSS/BLUE SHIELD | Admitting: Hematology and Oncology

## 2015-08-15 ENCOUNTER — Encounter: Payer: Self-pay | Admitting: Hematology and Oncology

## 2015-08-15 ENCOUNTER — Encounter: Payer: Self-pay | Admitting: *Deleted

## 2015-08-15 ENCOUNTER — Other Ambulatory Visit (HOSPITAL_BASED_OUTPATIENT_CLINIC_OR_DEPARTMENT_OTHER): Payer: BLUE CROSS/BLUE SHIELD

## 2015-08-15 ENCOUNTER — Ambulatory Visit (HOSPITAL_BASED_OUTPATIENT_CLINIC_OR_DEPARTMENT_OTHER): Payer: BLUE CROSS/BLUE SHIELD

## 2015-08-15 VITALS — BP 130/64

## 2015-08-15 DIAGNOSIS — E876 Hypokalemia: Secondary | ICD-10-CM

## 2015-08-15 DIAGNOSIS — R530 Neoplastic (malignant) related fatigue: Secondary | ICD-10-CM

## 2015-08-15 DIAGNOSIS — C50212 Malignant neoplasm of upper-inner quadrant of left female breast: Secondary | ICD-10-CM

## 2015-08-15 DIAGNOSIS — R05 Cough: Secondary | ICD-10-CM

## 2015-08-15 DIAGNOSIS — Z5111 Encounter for antineoplastic chemotherapy: Secondary | ICD-10-CM | POA: Diagnosis not present

## 2015-08-15 DIAGNOSIS — Z170 Estrogen receptor positive status [ER+]: Secondary | ICD-10-CM

## 2015-08-15 DIAGNOSIS — R110 Nausea: Secondary | ICD-10-CM

## 2015-08-15 DIAGNOSIS — Z5112 Encounter for antineoplastic immunotherapy: Secondary | ICD-10-CM | POA: Diagnosis not present

## 2015-08-15 DIAGNOSIS — R197 Diarrhea, unspecified: Secondary | ICD-10-CM | POA: Diagnosis not present

## 2015-08-15 DIAGNOSIS — L658 Other specified nonscarring hair loss: Secondary | ICD-10-CM

## 2015-08-15 LAB — CBC WITH DIFFERENTIAL/PLATELET
BASO%: 0.5 % (ref 0.0–2.0)
BASOS ABS: 0 10*3/uL (ref 0.0–0.1)
EOS ABS: 0.1 10*3/uL (ref 0.0–0.5)
EOS%: 1 % (ref 0.0–7.0)
HEMATOCRIT: 32.1 % — AB (ref 34.8–46.6)
HEMOGLOBIN: 10.5 g/dL — AB (ref 11.6–15.9)
LYMPH#: 1.4 10*3/uL (ref 0.9–3.3)
LYMPH%: 22.3 % (ref 14.0–49.7)
MCH: 27.4 pg (ref 25.1–34.0)
MCHC: 32.7 g/dL (ref 31.5–36.0)
MCV: 83.8 fL (ref 79.5–101.0)
MONO#: 0.5 10*3/uL (ref 0.1–0.9)
MONO%: 8.8 % (ref 0.0–14.0)
NEUT#: 4.1 10*3/uL (ref 1.5–6.5)
NEUT%: 67.4 % (ref 38.4–76.8)
Platelets: 294 10*3/uL (ref 145–400)
RBC: 3.83 10*6/uL (ref 3.70–5.45)
RDW: 13.6 % (ref 11.2–14.5)
WBC: 6.1 10*3/uL (ref 3.9–10.3)

## 2015-08-15 LAB — COMPREHENSIVE METABOLIC PANEL
ALBUMIN: 3.9 g/dL (ref 3.5–5.0)
ALK PHOS: 83 U/L (ref 40–150)
ALT: 40 U/L (ref 0–55)
AST: 28 U/L (ref 5–34)
Anion Gap: 11 mEq/L (ref 3–11)
BUN: 10.2 mg/dL (ref 7.0–26.0)
CALCIUM: 9.7 mg/dL (ref 8.4–10.4)
CO2: 26 mEq/L (ref 22–29)
Chloride: 102 mEq/L (ref 98–109)
Creatinine: 0.8 mg/dL (ref 0.6–1.1)
EGFR: 79 mL/min/{1.73_m2} — AB (ref >90)
Glucose: 110 mg/dl (ref 70–140)
POTASSIUM: 3.3 meq/L — AB (ref 3.5–5.1)
Sodium: 139 mEq/L (ref 136–145)
Total Bilirubin: <0.3 mg/dL (ref 0.20–1.20)
Total Protein: 7.2 g/dL (ref 6.4–8.3)

## 2015-08-15 MED ORDER — TRASTUZUMAB CHEMO 150 MG IV SOLR
6.0000 mg/kg | Freq: Once | INTRAVENOUS | Status: AC
  Administered 2015-08-15: 420 mg via INTRAVENOUS
  Filled 2015-08-15: qty 20

## 2015-08-15 MED ORDER — DIPHENHYDRAMINE HCL 25 MG PO CAPS
ORAL_CAPSULE | ORAL | Status: AC
  Filled 2015-08-15: qty 2

## 2015-08-15 MED ORDER — PALONOSETRON HCL INJECTION 0.25 MG/5ML
INTRAVENOUS | Status: AC
  Filled 2015-08-15: qty 5

## 2015-08-15 MED ORDER — PALONOSETRON HCL INJECTION 0.25 MG/5ML
0.2500 mg | Freq: Once | INTRAVENOUS | Status: AC
  Administered 2015-08-15: 0.25 mg via INTRAVENOUS

## 2015-08-15 MED ORDER — SODIUM CHLORIDE 0.9 % IV SOLN
700.0000 mg | Freq: Once | INTRAVENOUS | Status: AC
  Administered 2015-08-15: 700 mg via INTRAVENOUS
  Filled 2015-08-15: qty 70

## 2015-08-15 MED ORDER — SODIUM CHLORIDE 0.9% FLUSH
10.0000 mL | INTRAVENOUS | Status: DC | PRN
  Administered 2015-08-15: 10 mL
  Filled 2015-08-15: qty 10

## 2015-08-15 MED ORDER — HEPARIN SOD (PORK) LOCK FLUSH 100 UNIT/ML IV SOLN
500.0000 [IU] | Freq: Once | INTRAVENOUS | Status: AC | PRN
  Administered 2015-08-15: 500 [IU]
  Filled 2015-08-15: qty 5

## 2015-08-15 MED ORDER — SODIUM CHLORIDE 0.9 % IV SOLN
75.0000 mg/m2 | Freq: Once | INTRAVENOUS | Status: AC
  Administered 2015-08-15: 130 mg via INTRAVENOUS
  Filled 2015-08-15: qty 13

## 2015-08-15 MED ORDER — SODIUM CHLORIDE 0.9 % IV SOLN: Freq: Once | INTRAVENOUS | Status: DC

## 2015-08-15 MED ORDER — SODIUM CHLORIDE 0.9 % IV SOLN
Freq: Once | INTRAVENOUS | Status: AC
  Administered 2015-08-15: 10:00:00 via INTRAVENOUS

## 2015-08-15 MED ORDER — SODIUM CHLORIDE 0.9 % IV SOLN: 10.0000 mg | Freq: Once | INTRAVENOUS | Status: DC

## 2015-08-15 MED ORDER — SODIUM CHLORIDE 0.9 % IV SOLN
Freq: Once | INTRAVENOUS | Status: AC
  Administered 2015-08-15: 11:00:00 via INTRAVENOUS
  Filled 2015-08-15: qty 5

## 2015-08-15 MED ORDER — DIPHENHYDRAMINE HCL 25 MG PO CAPS
50.0000 mg | ORAL_CAPSULE | Freq: Once | ORAL | Status: AC
  Administered 2015-08-15: 50 mg via ORAL

## 2015-08-15 MED ORDER — SODIUM CHLORIDE 0.9 % IV SOLN
420.0000 mg | Freq: Once | INTRAVENOUS | Status: AC
  Administered 2015-08-15: 420 mg via INTRAVENOUS
  Filled 2015-08-15: qty 14

## 2015-08-15 MED ORDER — ACETAMINOPHEN 325 MG PO TABS
ORAL_TABLET | ORAL | Status: AC
  Filled 2015-08-15: qty 2

## 2015-08-15 MED ORDER — PEGFILGRASTIM 6 MG/0.6ML ~~LOC~~ PSKT
6.0000 mg | PREFILLED_SYRINGE | Freq: Once | SUBCUTANEOUS | Status: AC
  Administered 2015-08-15: 6 mg via SUBCUTANEOUS
  Filled 2015-08-15: qty 0.6

## 2015-08-15 MED ORDER — ACETAMINOPHEN 325 MG PO TABS
650.0000 mg | ORAL_TABLET | Freq: Once | ORAL | Status: AC
  Administered 2015-08-15: 650 mg via ORAL

## 2015-08-15 NOTE — Progress Notes (Signed)
Patient Care Team: Terald Sleeper, PA as PCP - General ([place]) Sylvan Cheese, NP as Nurse Practitioner (Hematology and Oncology)  DIAGNOSIS: Breast cancer of upper-inner quadrant of left female breast [facility])   Staging form: Breast, AJCC 7th Edition   - Clinical stage from 06/08/2015: Stage IA (T1c, N0, M0) - Unsigned         Staging comments: Staged at breast conference on 5.31.17  SUMMARY OF ONCOLOGIC HISTORY:   Breast cancer of upper-inner quadrant of left female breast ([place])   05/26/2015 Initial Diagnosis    Screen detected left breast asymmetry (posterior 1.6 cm): Grade 2-3 IDC ER/PR positive HER-2 positive Ki-67 20% plus calcs (span 6.1 cm): High-grade DCIS with suspicious foci of invasion (4.2 cm apart)     06/24/2015 Surgery    Left simple mastectomy with reconstruction: IDC grade 3, 2.5 cm, with high-grade DCIS, ALH, LVI present, margins negative, 0/1 sentinel node negative, T2 N0 stage II a, ER 100%, PR 5%, HER-2 positive ratio 1.56 with average copy #8.25, Ki-67 20%     07/25/2015 -  Chemotherapy    Adjuvant chemotherapy with TCH Perjeta 6 cycles followed by Herceptin maintenance for 1 year      CHIEF COMPLIANT: cycle 2 TCH Perjeta  INTERVAL HISTORY: Heidi Walls is a 48 with above-mentioned history of left mastectomy and is currently on adjuvant chemotherapy with Ecru. She is tolerating the chemotherapy moderately well. She had diarrhea for the entire 3 weeks but only once or twice per day. She is able to take Imodium and stop the diarrhea. She is finally able to taste her food. She had thrush which went away after treating her with Diflucan for about a week. She denies any abdominal pain nausea vomiting. She is not noticing significant alopecia.  REVIEW OF SYSTEMS:   Constitutional: Denies fevers, chills or abnormal weight loss Eyes: Denies blurriness of vision Ears, nose, mouth, throat, and face: Denies mucositis or sore throat Respiratory:  Denies cough, dyspnea or wheezes Cardiovascular: Denies palpitation, chest discomfort Gastrointestinal:  Frequent diarrhea Skin: Denies abnormal skin rashes Lymphatics: Denies new lymphadenopathy or easy bruising Neurological:Denies numbness, tingling or new weaknesses Behavioral/Psych: Mood is stable, no new changes  Extremities: No lower extremity edema  All other systems were reviewed with the patient and are negative.  I have reviewed the past medical history, past surgical history, social history and family history with the patient and they are unchanged from previous note.  ALLERGIES:  is allergic to codeine.  MEDICATIONS:  Current Outpatient Prescriptions  Medication Sig Dispense Refill  . alendronate (FOSAMAX) 70 MG tablet   8  . ALPRAZolam (XANAX) 0.25 MG tablet   2  . ARIPiprazole (ABILIFY) 2 MG tablet Take 2 mg by mouth daily.    . Ascorbic Acid (VITAMIN C) 1000 MG tablet Take 1,000 mg by mouth daily.    . Biotin (BIOTIN 5000) 5 MG CAPS Take 5,000 mg by mouth daily.    . citalopram (CELEXA) 20 MG tablet Take 20 mg by mouth every morning.    . cyclobenzaprine (FLEXERIL) 10 MG tablet prn  1  . esomeprazole (NEXIUM) 20 MG capsule Take 20 mg by mouth daily at 12 noon.    . fluconazole (DIFLUCAN) 100 MG tablet Take 1 tablet (100 mg total) by mouth daily. 14 tablet 0  . hydrochlorothiazide (HYDRODIURIL) 12.5 MG tablet Take 12.5 mg by mouth daily.    Marland Kitchen lidocaine-prilocaine (EMLA) cream Apply to affected area once 30 g  3  . LORazepam (ATIVAN) 0.5 MG tablet Take 1 tablet (0.5 mg total) by mouth every 6 (six) hours as needed (Nausea or vomiting). 30 tablet 0  . MELOXICAM PO Take 7.5 mg by mouth.     . Multiple Vitamin (MULTIVITAMIN) tablet Take 1 tablet by mouth daily.    . Omega-3 Fatty Acids (FISH OIL) 1200 MG CAPS Take 1,200 mg by mouth daily.    . ondansetron (ZOFRAN) 8 MG tablet Take 1 tablet (8 mg total) by mouth 2 (two) times daily as needed for refractory nausea / vomiting.  Start on day 3 after chemo. 30 tablet 1  . prochlorperazine (COMPAZINE) 10 MG tablet Take 1 tablet (10 mg total) by mouth every 6 (six) hours as needed (Nausea or vomiting). 30 tablet 1   No current facility-administered medications for this visit.     PHYSICAL EXAMINATION: ECOG PERFORMANCE STATUS: 1 - Symptomatic but completely ambulatory  Vitals:   08/15/15 0925  BP: (!) 158/82  Pulse: 96  Resp: 18  Temp: 98.2 F (36.8 C)   Filed Weights   08/15/15 0925  Weight: 148 lb (67.1 kg)    GENERAL:alert, no distress and comfortable SKIN: skin color, texture, turgor are normal, no rashes or significant lesions EYES: normal, Conjunctiva are pink and non-injected, sclera clear OROPHARYNX:no exudate, no erythema and lips, buccal mucosa, and tongue normal , thrush has resolved NECK: supple, thyroid normal size, non-tender, without nodularity LYMPH:  no palpable lymphadenopathy in the cervical, axillary or inguinal LUNGS: clear to auscultation and percussion with normal breathing effort HEART: regular rate & rhythm and no murmurs and no lower extremity edema ABDOMEN:abdomen soft, non-tender and normal bowel sounds MUSCULOSKELETAL:no cyanosis of digits and no clubbing  NEURO: alert & oriented x 3 with fluent speech, no focal motor/sensory deficits EXTREMITIES: No lower extremity edema   LABORATORY DATA:  I have reviewed the data as listed   Chemistry      Component Value Date/Time   NA 136 08/01/2015 0828   K 3.5 08/01/2015 0828   CO2 27 08/01/2015 0828   BUN 15.0 08/01/2015 0828   CREATININE 1.0 08/01/2015 0828      Component Value Date/Time   CALCIUM 10.1 08/01/2015 0828   ALKPHOS 124 08/01/2015 0828   AST 37 (H) 08/01/2015 0828   ALT 63 (H) 08/01/2015 0828   BILITOT <0.30 08/01/2015 0828       Lab Results  Component Value Date   WBC 6.1 08/15/2015   HGB 10.5 (L) 08/15/2015   HCT 32.1 (L) 08/15/2015   MCV 83.8 08/15/2015   PLT 294 08/15/2015   NEUTROABS 4.1  08/15/2015     ASSESSMENT & PLAN:  Breast cancer of upper-inner quadrant of left female breast ([place]) Left simple mastectomy with reconstruction 06/24/2015: IDC grade 3, 2.5 cm, with high-grade DCIS, ALH, LVI present, margins negative, 0/1 sentinel node negative, , ER 100%, PR 5%, HER-2 positive ratio 1.56 with average copy #8.25, Ki-67 20%  Pathologic stage:T2 N0 stage II a Treatment plan: 1. Adjuvant chemotherapy with TCHP followed by Herceptin maintenance for 1 year 2. Followed by adjuvant hormonal therapy ---------------------------------------------------------------------------------------------------------- Current treatment: Cycle 2 day 1 TCH Perjeta Echocardiogram 07/27/2015 60-65%  Chemotherapy toxicities: 1. Nausea grade 1 2. Thrush: resolved with Diflucan 3. Diarrhea grade 1-2: Improves with Imodium daily. her diarrhea lasted the entire 3 weeks mostly once a day 4. Dry cough: Probably related to fungal infection: Diflucan should help. 5. Severe fatigue 6. Hypokalemia: Patient will take  Potassium-rich foods  7. Alopecia  We are monitoring her very closely for toxicities. Return to clinic in 3 weeks for cycle 3   No orders of the defined types were placed in this encounter.  The patient has a good understanding of the overall plan. she agrees with it. she will call with any problems that may develop before the next visit here.   Rulon Eisenmenger, MD 08/15/15

## 2015-08-15 NOTE — Assessment & Plan Note (Signed)
Left simple mastectomy with reconstruction 06/24/2015: IDC grade 3, 2.5 cm, with high-grade DCIS, ALH, LVI present, margins negative, 0/1 sentinel node negative, , ER 100%, PR 5%, HER-2 positive ratio 1.56 with average copy #8.25, Ki-67 20%  Pathologic stage:T2 N0 stage II a Treatment plan: 1. Adjuvant chemotherapy with TCHP followed by Herceptin maintenance for 1 year 2. Followed by adjuvant hormonal therapy ---------------------------------------------------------------------------------------------------------- Current treatment: Cycle 2 day 1 TCH Perjeta Echocardiogram 07/27/2015 60-65%  Chemotherapy toxicities: 1. Nausea grade 1 2. Thrush: Prescribed Diflucan 3. Diarrhea grade 1 4. Dry cough: Probably related to fungal infection: Diflucan should help. 5. Severe fatigue  We are monitoring her very closely for toxicities. Return to clinic in 3 weeks for cycle 3

## 2015-08-15 NOTE — Patient Instructions (Signed)
[facility] Discharge Instructions for Patients Receiving Chemotherapy  Today you received the following chemotherapy agents: Herceptin, Perjeta, Taxotere and Carboplatin.  To help prevent nausea and vomiting after your treatment, we encourage you to take your nausea medication:Compazine 10 mg every 6 hours as needed; Ativan 0.5 mg every 6 hours as needed.   If you develop nausea and vomiting that is not controlled by your nausea medication, call the clinic.   BELOW ARE SYMPTOMS THAT SHOULD BE REPORTED IMMEDIATELY:  *FEVER GREATER THAN 100.5 F  *CHILLS WITH OR WITHOUT FEVER  NAUSEA AND VOMITING THAT IS NOT CONTROLLED WITH YOUR NAUSEA MEDICATION  *UNUSUAL SHORTNESS OF BREATH  *UNUSUAL BRUISING OR BLEEDING  TENDERNESS IN MOUTH AND THROAT WITH OR WITHOUT PRESENCE OF ULCERS  *URINARY PROBLEMS  *BOWEL PROBLEMS  UNUSUAL RASH Items with * indicate a potential emergency and should be followed up as soon as possible.  Feel free to call the clinic you have any questions or concerns. The clinic phone number is [phone].  Please show the [place] at check-in to the Emergency Department and triage nurse.

## 2015-08-24 ENCOUNTER — Encounter: Payer: Self-pay | Admitting: *Deleted

## 2015-08-25 ENCOUNTER — Other Ambulatory Visit: Payer: Self-pay | Admitting: *Deleted

## 2015-08-25 DIAGNOSIS — C50212 Malignant neoplasm of upper-inner quadrant of left female breast: Secondary | ICD-10-CM

## 2015-08-25 MED ORDER — DIPHENOXYLATE-ATROPINE 2.5-0.025 MG PO TABS: 1.0000 | ORAL_TABLET | Freq: Four times a day (QID) | ORAL | 2 refills | Status: DC | PRN

## 2015-08-25 NOTE — Telephone Encounter (Signed)
Received call from patient stating she feels good except for the diarrhea.  She has taken immodium and it helps for a bit.  She states she is drinking lots of fluid and eating.  Called in Lomotil to her pharmacy.

## 2015-09-05 ENCOUNTER — Other Ambulatory Visit (HOSPITAL_BASED_OUTPATIENT_CLINIC_OR_DEPARTMENT_OTHER): Payer: BLUE CROSS/BLUE SHIELD

## 2015-09-05 ENCOUNTER — Ambulatory Visit (HOSPITAL_BASED_OUTPATIENT_CLINIC_OR_DEPARTMENT_OTHER): Payer: BLUE CROSS/BLUE SHIELD

## 2015-09-05 ENCOUNTER — Other Ambulatory Visit: Payer: Self-pay

## 2015-09-05 ENCOUNTER — Ambulatory Visit (HOSPITAL_BASED_OUTPATIENT_CLINIC_OR_DEPARTMENT_OTHER): Payer: BLUE CROSS/BLUE SHIELD | Admitting: Hematology and Oncology

## 2015-09-05 ENCOUNTER — Encounter: Payer: Self-pay | Admitting: *Deleted

## 2015-09-05 ENCOUNTER — Encounter: Payer: Self-pay | Admitting: Hematology and Oncology

## 2015-09-05 DIAGNOSIS — D649 Anemia, unspecified: Secondary | ICD-10-CM

## 2015-09-05 DIAGNOSIS — Z5112 Encounter for antineoplastic immunotherapy: Secondary | ICD-10-CM

## 2015-09-05 DIAGNOSIS — C50212 Malignant neoplasm of upper-inner quadrant of left female breast: Secondary | ICD-10-CM

## 2015-09-05 DIAGNOSIS — R5383 Other fatigue: Secondary | ICD-10-CM

## 2015-09-05 DIAGNOSIS — E876 Hypokalemia: Secondary | ICD-10-CM

## 2015-09-05 DIAGNOSIS — R110 Nausea: Secondary | ICD-10-CM

## 2015-09-05 DIAGNOSIS — Z170 Estrogen receptor positive status [ER+]: Secondary | ICD-10-CM

## 2015-09-05 DIAGNOSIS — R197 Diarrhea, unspecified: Secondary | ICD-10-CM

## 2015-09-05 LAB — CBC WITH DIFFERENTIAL/PLATELET
BASO%: 0.6 % (ref 0.0–2.0)
BASOS ABS: 0 10*3/uL (ref 0.0–0.1)
EOS ABS: 0.1 10*3/uL (ref 0.0–0.5)
EOS%: 0.9 % (ref 0.0–7.0)
HCT: 27.4 % — ABNORMAL LOW (ref 34.8–46.6)
HEMOGLOBIN: 8.9 g/dL — AB (ref 11.6–15.9)
LYMPH%: 27.7 % (ref 14.0–49.7)
MCH: 27.6 pg (ref 25.1–34.0)
MCHC: 32.5 g/dL (ref 31.5–36.0)
MCV: 85 fL (ref 79.5–101.0)
MONO#: 0.6 10*3/uL (ref 0.1–0.9)
MONO%: 9.1 % (ref 0.0–14.0)
NEUT%: 61.7 % (ref 38.4–76.8)
NEUTROS ABS: 4 10*3/uL (ref 1.5–6.5)
PLATELETS: 366 10*3/uL (ref 145–400)
RBC: 3.22 10*6/uL — AB (ref 3.70–5.45)
RDW: 14.2 % (ref 11.2–14.5)
WBC: 6.4 10*3/uL (ref 3.9–10.3)
lymph#: 1.8 10*3/uL (ref 0.9–3.3)

## 2015-09-05 LAB — COMPREHENSIVE METABOLIC PANEL
ALBUMIN: 3.6 g/dL (ref 3.5–5.0)
ALK PHOS: 80 U/L (ref 40–150)
ALT: 26 U/L (ref 0–55)
ANION GAP: 12 meq/L — AB (ref 3–11)
AST: 23 U/L (ref 5–34)
BUN: 12.9 mg/dL (ref 7.0–26.0)
CO2: 25 mEq/L (ref 22–29)
Calcium: 9.3 mg/dL (ref 8.4–10.4)
Chloride: 103 mEq/L (ref 98–109)
Creatinine: 0.8 mg/dL (ref 0.6–1.1)
EGFR: 78 mL/min/{1.73_m2} — AB (ref >90)
Glucose: 103 mg/dl (ref 70–140)
POTASSIUM: 2.9 meq/L — AB (ref 3.5–5.1)
Sodium: 140 mEq/L (ref 136–145)
TOTAL PROTEIN: 6.6 g/dL (ref 6.4–8.3)

## 2015-09-05 MED ORDER — PEGFILGRASTIM 6 MG/0.6ML ~~LOC~~ PSKT
6.0000 mg | PREFILLED_SYRINGE | Freq: Once | SUBCUTANEOUS | Status: AC
  Administered 2015-09-05: 6 mg via SUBCUTANEOUS
  Filled 2015-09-05: qty 0.6

## 2015-09-05 MED ORDER — PALONOSETRON HCL INJECTION 0.25 MG/5ML
INTRAVENOUS | Status: AC
  Filled 2015-09-05: qty 5

## 2015-09-05 MED ORDER — SODIUM CHLORIDE 0.9 % IV SOLN
60.0000 mg/m2 | Freq: Once | INTRAVENOUS | Status: AC
  Administered 2015-09-05: 100 mg via INTRAVENOUS
  Filled 2015-09-05: qty 10

## 2015-09-05 MED ORDER — SODIUM CHLORIDE 0.9 % IV SOLN
534.0000 mg | Freq: Once | INTRAVENOUS | Status: AC
  Administered 2015-09-05: 530 mg via INTRAVENOUS
  Filled 2015-09-05: qty 53

## 2015-09-05 MED ORDER — SODIUM CHLORIDE 0.9 % IV SOLN
INTRAVENOUS | Status: DC
  Administered 2015-09-05: 10:00:00 via INTRAVENOUS
  Filled 2015-09-05: qty 1000

## 2015-09-05 MED ORDER — DIPHENHYDRAMINE HCL 25 MG PO CAPS
50.0000 mg | ORAL_CAPSULE | Freq: Once | ORAL | Status: AC
  Administered 2015-09-05: 50 mg via ORAL

## 2015-09-05 MED ORDER — SODIUM CHLORIDE 0.9 % IV SOLN
Freq: Once | INTRAVENOUS | Status: AC
  Administered 2015-09-05: 10:00:00 via INTRAVENOUS

## 2015-09-05 MED ORDER — ACETAMINOPHEN 325 MG PO TABS
ORAL_TABLET | ORAL | Status: AC
  Filled 2015-09-05: qty 2

## 2015-09-05 MED ORDER — SODIUM CHLORIDE 0.9% FLUSH
10.0000 mL | INTRAVENOUS | Status: DC | PRN
  Administered 2015-09-05: 10 mL
  Filled 2015-09-05: qty 10

## 2015-09-05 MED ORDER — PERTUZUMAB CHEMO INJECTION 420 MG/14ML
420.0000 mg | Freq: Once | INTRAVENOUS | Status: AC
  Administered 2015-09-05: 420 mg via INTRAVENOUS
  Filled 2015-09-05: qty 14

## 2015-09-05 MED ORDER — TRASTUZUMAB CHEMO 150 MG IV SOLR
6.0000 mg/kg | Freq: Once | INTRAVENOUS | Status: AC
  Administered 2015-09-05: 420 mg via INTRAVENOUS
  Filled 2015-09-05: qty 20

## 2015-09-05 MED ORDER — POTASSIUM CHLORIDE CRYS ER 20 MEQ PO TBCR: 20.0000 meq | EXTENDED_RELEASE_TABLET | Freq: Two times a day (BID) | ORAL | 0 refills | Status: DC

## 2015-09-05 MED ORDER — DIPHENHYDRAMINE HCL 25 MG PO CAPS
ORAL_CAPSULE | ORAL | Status: AC
  Filled 2015-09-05: qty 2

## 2015-09-05 MED ORDER — HEPARIN SOD (PORK) LOCK FLUSH 100 UNIT/ML IV SOLN
500.0000 [IU] | Freq: Once | INTRAVENOUS | Status: AC | PRN
  Administered 2015-09-05: 500 [IU]
  Filled 2015-09-05: qty 5

## 2015-09-05 MED ORDER — FOSAPREPITANT DIMEGLUMINE INJECTION 150 MG
Freq: Once | INTRAVENOUS | Status: AC
  Administered 2015-09-05: 12:00:00 via INTRAVENOUS
  Filled 2015-09-05: qty 5

## 2015-09-05 MED ORDER — ACETAMINOPHEN 325 MG PO TABS
650.0000 mg | ORAL_TABLET | Freq: Once | ORAL | Status: AC
  Administered 2015-09-05: 650 mg via ORAL

## 2015-09-05 MED ORDER — PALONOSETRON HCL INJECTION 0.25 MG/5ML
0.2500 mg | Freq: Once | INTRAVENOUS | Status: AC
  Administered 2015-09-05: 0.25 mg via INTRAVENOUS

## 2015-09-05 NOTE — Progress Notes (Signed)
Patient Care Team: Terald Sleeper, PA as PCP - General ([place]) Sylvan Cheese, NP as Nurse Practitioner (Hematology and Oncology)  DIAGNOSIS: Breast cancer of upper-inner quadrant of left female breast [facility])   Staging form: Breast, AJCC 7th Edition   - Clinical stage from 06/08/2015: Stage IA (T1c, N0, M0) - Unsigned         Staging comments: Staged at breast conference on 5.31.17  SUMMARY OF ONCOLOGIC HISTORY:   Breast cancer of upper-inner quadrant of left female breast ([place])   05/26/2015 Initial Diagnosis    Screen detected left breast asymmetry (posterior 1.6 cm): Grade 2-3 IDC ER/PR positive HER-2 positive Ki-67 20% plus calcs (span 6.1 cm): High-grade DCIS with suspicious foci of invasion (4.2 cm apart)      06/24/2015 Surgery    Left simple mastectomy with reconstruction: IDC grade 3, 2.5 cm, with high-grade DCIS, ALH, LVI present, margins negative, 0/1 sentinel node negative, T2 N0 stage II a, ER 100%, PR 5%, HER-2 positive ratio 1.56 with average copy #8.25, Ki-67 20%      07/25/2015 -  Chemotherapy    Adjuvant chemotherapy with TCH Perjeta 6 cycles followed by Herceptin maintenance for 1 year       CHIEF COMPLIANT: cycle 3 TCH Perjeta  INTERVAL HISTORY: Heidi Walls is a 60 year old with above-mentioned history of left breast cancer currently on adjuvant chemotherapy with TCH Perjeta. She is having problems with diarrhea. This is in spite of giving Lomotil. She is also using Imodium. She also had nausea for a whole week after chemotherapy. This is in spite of using Emend and Aloxi. She was complaining of profound fatigue for 7-10 days after chemotherapy. This is upsetting her significantly. Because she is used to staying very active and busy all the time. Her appetite and taste has improved over the past week and she was eating a lot of good food. Her weight is down a little bit.  REVIEW OF SYSTEMS:   Constitutional: Denies fevers, chills or abnormal  weight loss, fatigue Eyes: Denies blurriness of vision Ears, nose, mouth, throat, and face: Denies mucositis or sore throat Respiratory: Denies cough, dyspnea or wheezes Cardiovascular: Denies palpitation, chest discomfort Gastrointestinal:  diarrhea Skin: Denies abnormal skin rashes Lymphatics: Denies new lymphadenopathy or easy bruising Neurological:Denies numbness, tingling or new weaknesses Behavioral/Psych: Mood is stable, no new changes  Extremities: No lower extremity edema Breast: left mastectomy All other systems were reviewed with the patient and are negative.  I have reviewed the past medical history, past surgical history, social history and family history with the patient and they are unchanged from previous note.  ALLERGIES:  is allergic to codeine.  MEDICATIONS:  Current Outpatient Prescriptions  Medication Sig Dispense Refill  . alendronate (FOSAMAX) 70 MG tablet   8  . ALPRAZolam (XANAX) 0.25 MG tablet   2  . ARIPiprazole (ABILIFY) 2 MG tablet Take 2 mg by mouth daily.    . Ascorbic Acid (VITAMIN C) 1000 MG tablet Take 1,000 mg by mouth daily.    . Biotin (BIOTIN 5000) 5 MG CAPS Take 5,000 mg by mouth daily.    . citalopram (CELEXA) 20 MG tablet Take 20 mg by mouth every morning.    . cyclobenzaprine (FLEXERIL) 10 MG tablet prn  1  . diphenoxylate-atropine (LOMOTIL) 2.5-0.025 MG tablet Take 1 tablet by mouth 4 (four) times daily as needed for diarrhea or loose stools. 30 tablet 2  . esomeprazole (NEXIUM) 20 MG capsule Take 20 mg  by mouth daily at 12 noon.    . fluconazole (DIFLUCAN) 100 MG tablet Take 1 tablet (100 mg total) by mouth daily. 14 tablet 0  . hydrochlorothiazide (HYDRODIURIL) 12.5 MG tablet Take 12.5 mg by mouth daily.    Marland Kitchen lidocaine-prilocaine (EMLA) cream Apply to affected area once 30 g 3  . LORazepam (ATIVAN) 0.5 MG tablet Take 1 tablet (0.5 mg total) by mouth every 6 (six) hours as needed (Nausea or vomiting). 30 tablet 0  . MELOXICAM PO Take 7.5  mg by mouth.     . Multiple Vitamin (MULTIVITAMIN) tablet Take 1 tablet by mouth daily.    . Omega-3 Fatty Acids (FISH OIL) 1200 MG CAPS Take 1,200 mg by mouth daily.    . ondansetron (ZOFRAN) 8 MG tablet Take 1 tablet (8 mg total) by mouth 2 (two) times daily as needed for refractory nausea / vomiting. Start on day 3 after chemo. 30 tablet 1  . prochlorperazine (COMPAZINE) 10 MG tablet Take 1 tablet (10 mg total) by mouth every 6 (six) hours as needed (Nausea or vomiting). 30 tablet 1   No current facility-administered medications for this visit.     PHYSICAL EXAMINATION: ECOG PERFORMANCE STATUS: 1 - Symptomatic but completely ambulatory  Vitals:   09/05/15 0838  BP: (!) 131/58  Pulse: 95  Resp: 18  Temp: 98.3 F (36.8 C)   Filed Weights   09/05/15 0838  Weight: 143 lb 9.6 oz (65.1 kg)    GENERAL:alert, no distress and comfortable SKIN: skin color, texture, turgor are normal, no rashes or significant lesions EYES: normal, Conjunctiva are pink and non-injected, sclera clear OROPHARYNX:no exudate, no erythema and lips, buccal mucosa, and tongue normal  NECK: supple, thyroid normal size, non-tender, without nodularity LYMPH:  no palpable lymphadenopathy in the cervical, axillary or inguinal LUNGS: clear to auscultation and percussion with normal breathing effort HEART: regular rate & rhythm and no murmurs and no lower extremity edema ABDOMEN:abdomen soft, non-tender and normal bowel sounds MUSCULOSKELETAL:no cyanosis of digits and no clubbing  NEURO: alert & oriented x 3 with fluent speech, no focal motor/sensory deficits EXTREMITIES: No lower extremity edema  LABORATORY DATA:  I have reviewed the data as listed   Chemistry      Component Value Date/Time   NA 139 08/15/2015 0915   K 3.3 (L) 08/15/2015 0915   CO2 26 08/15/2015 0915   BUN 10.2 08/15/2015 0915   CREATININE 0.8 08/15/2015 0915      Component Value Date/Time   CALCIUM 9.7 08/15/2015 0915   ALKPHOS 83  08/15/2015 0915   AST 28 08/15/2015 0915   ALT 40 08/15/2015 0915   BILITOT <0.30 08/15/2015 0915       Lab Results  Component Value Date   WBC 6.4 09/05/2015   HGB 8.9 (L) 09/05/2015   HCT 27.4 (L) 09/05/2015   MCV 85.0 09/05/2015   PLT 366 09/05/2015   NEUTROABS 4.0 09/05/2015     ASSESSMENT & PLAN:  Breast cancer of upper-inner quadrant of left female breast ([facility]) Left simple mastectomy with reconstruction 06/24/2015: IDC grade 3, 2.5 cm, with high-grade DCIS, ALH, LVI present, margins negative, 0/1 sentinel node negative, , ER 100%, PR 5%, HER-2 positive ratio 1.56 with average copy #8.25, Ki-67 20%  Pathologic stage:T2 N0 stage II a Treatment plan: 1. Adjuvant chemotherapy with TCHP followed by Herceptin maintenance for 1 year 2. Followed by adjuvant hormonal therapy ---------------------------------------------------------------------------------------------------------- Current treatment: Cycle 3 day 1TCH Perjeta Echocardiogram 07/27/2015 60-65%  Chemotherapy toxicities:  1. Nausea grade 1 2. Thrush: resolved with Diflucan 3. Diarrhea grade 1-2: Currently on Lomotil and Imodium daily. her diarrhea lasted the entire 3 weeks mostly 2-3 times daily. 4. Dry cough: Probably related to fungal infection: Diflucan should help. 5. Severe fatigue 6. Hypokalemia: Patient will take  Potassium-rich foods 7. Alopecia 8. Profound anemia:hemoglobin 8.9. I would decrease the dosage of chemotherapy from cycle 3. Patient takes oral iron every day.  We are monitoringher very closely for toxicities. Return to clinic in 3weeksfor cycle 4   No orders of the defined types were placed in this encounter.  The patient has a good understanding of the overall plan. she agrees with it. she will call with any problems that may develop before the next visit here.   Rulon Eisenmenger, MD 09/05/15

## 2015-09-05 NOTE — Patient Instructions (Signed)
[facility] Discharge Instructions for Patients Receiving Chemotherapy  Today you received the following chemotherapy agents: Herceptin, Perjeta, Carboplatin and Taxotere.  To help prevent nausea and vomiting after your treatment, we encourage you to take your nausea medication as directed. If you develop nausea and vomiting that is not controlled by your nausea medication, call the clinic.   BELOW ARE SYMPTOMS THAT SHOULD BE REPORTED IMMEDIATELY:  *FEVER GREATER THAN 100.5 F  *CHILLS WITH OR WITHOUT FEVER  NAUSEA AND VOMITING THAT IS NOT CONTROLLED WITH YOUR NAUSEA MEDICATION  *UNUSUAL SHORTNESS OF BREATH  *UNUSUAL BRUISING OR BLEEDING  TENDERNESS IN MOUTH AND THROAT WITH OR WITHOUT PRESENCE OF ULCERS  *URINARY PROBLEMS  *BOWEL PROBLEMS  UNUSUAL RASH Items with * indicate a potential emergency and should be followed up as soon as possible.  Feel free to call the clinic you have any questions or concerns. The clinic phone number is [phone].  Please show the [place] at check-in to the Emergency Department and triage nurse.

## 2015-09-05 NOTE — Assessment & Plan Note (Signed)
Left simple mastectomy with reconstruction 06/24/2015: IDC grade 3, 2.5 cm, with high-grade DCIS, ALH, LVI present, margins negative, 0/1 sentinel node negative, , ER 100%, PR 5%, HER-2 positive ratio 1.56 with average copy #8.25, Ki-67 20%  Pathologic stage:T2 N0 stage II a Treatment plan: 1. Adjuvant chemotherapy with TCHP followed by Herceptin maintenance for 1 year 2. Followed by adjuvant hormonal therapy ---------------------------------------------------------------------------------------------------------- Current treatment: Cycle 3 day [employer] Perjeta Echocardiogram 07/27/2015 60-65%  Chemotherapy toxicities: 1. Nausea grade 1 2. Thrush: resolved with Diflucan 3. Diarrhea grade 1-2: Improves with Imodium daily. her diarrhea lasted the entire 3 weeks mostly once a day 4. Dry cough: Probably related to fungal infection: Diflucan should help. 5. Severe fatigue 6. Hypokalemia: Patient will take  Potassium-rich foods 7. Alopecia  We are monitoringher very closely for toxicities. Return to clinic in 3weeksfor cycle 4

## 2015-09-05 NOTE — Progress Notes (Signed)
Per Dr Lindi Adie, ok to treat with K+ of 2.9, pt verbalized understanding of instructions to double amt of oral potassium daily, per treatment plan pt to receive IV flu lids w/ 66meq of Potassium.

## 2015-09-07 ENCOUNTER — Telehealth: Payer: Self-pay | Admitting: *Deleted

## 2015-09-07 ENCOUNTER — Ambulatory Visit (HOSPITAL_BASED_OUTPATIENT_CLINIC_OR_DEPARTMENT_OTHER): Payer: BLUE CROSS/BLUE SHIELD

## 2015-09-07 ENCOUNTER — Other Ambulatory Visit: Payer: Self-pay | Admitting: *Deleted

## 2015-09-07 ENCOUNTER — Ambulatory Visit (INDEPENDENT_AMBULATORY_CARE_PROVIDER_SITE_OTHER): Payer: BLUE CROSS/BLUE SHIELD | Admitting: Physician Assistant

## 2015-09-07 ENCOUNTER — Encounter: Payer: Self-pay | Admitting: *Deleted

## 2015-09-07 ENCOUNTER — Encounter: Payer: Self-pay | Admitting: Physician Assistant

## 2015-09-07 VITALS — BP 120/69 | HR 91 | Temp 97.9°F | Ht 62.0 in | Wt 148.2 lb

## 2015-09-07 DIAGNOSIS — Z5189 Encounter for other specified aftercare: Secondary | ICD-10-CM

## 2015-09-07 DIAGNOSIS — R454 Irritability and anger: Secondary | ICD-10-CM

## 2015-09-07 DIAGNOSIS — I10 Essential (primary) hypertension: Secondary | ICD-10-CM

## 2015-09-07 DIAGNOSIS — F329 Major depressive disorder, single episode, unspecified: Secondary | ICD-10-CM

## 2015-09-07 DIAGNOSIS — K219 Gastro-esophageal reflux disease without esophagitis: Secondary | ICD-10-CM | POA: Diagnosis not present

## 2015-09-07 DIAGNOSIS — F32A Depression, unspecified: Secondary | ICD-10-CM | POA: Insufficient documentation

## 2015-09-07 DIAGNOSIS — M810 Age-related osteoporosis without current pathological fracture: Secondary | ICD-10-CM | POA: Diagnosis not present

## 2015-09-07 DIAGNOSIS — F411 Generalized anxiety disorder: Secondary | ICD-10-CM

## 2015-09-07 DIAGNOSIS — M1711 Unilateral primary osteoarthritis, right knee: Secondary | ICD-10-CM | POA: Diagnosis not present

## 2015-09-07 DIAGNOSIS — C50212 Malignant neoplasm of upper-inner quadrant of left female breast: Secondary | ICD-10-CM

## 2015-09-07 DIAGNOSIS — M8589 Other specified disorders of bone density and structure, multiple sites: Secondary | ICD-10-CM | POA: Insufficient documentation

## 2015-09-07 MED ORDER — RALOXIFENE HCL 60 MG PO TABS: 60.0000 mg | ORAL_TABLET | Freq: Every day | ORAL | 11 refills | Status: DC

## 2015-09-07 MED ORDER — ALPRAZOLAM 0.25 MG PO TABS: 0.2500 mg | ORAL_TABLET | Freq: Three times a day (TID) | ORAL | 2 refills | Status: DC | PRN

## 2015-09-07 MED ORDER — PEGFILGRASTIM INJECTION 6 MG/0.6ML ~~LOC~~
6.0000 mg | PREFILLED_SYRINGE | Freq: Once | SUBCUTANEOUS | Status: AC
  Administered 2015-09-07: 6 mg via SUBCUTANEOUS
  Filled 2015-09-07: qty 0.6

## 2015-09-07 MED ORDER — HYDROCHLOROTHIAZIDE 12.5 MG PO TABS: 12.5000 mg | ORAL_TABLET | Freq: Every day | ORAL | 3 refills | Status: DC

## 2015-09-07 MED ORDER — LISINOPRIL 5 MG PO TABS: 5.0000 mg | ORAL_TABLET | Freq: Every day | ORAL | 3 refills | Status: DC

## 2015-09-07 NOTE — Telephone Encounter (Signed)
Pt states she was at work yesterday when Maurine Minister was supposed to dispense. Did not notice anything until she go home and saw that the band of her underwear was wet. Slight red area at insertion site, no pain. She does not know if she she got any of the neulasta. Wants to know what Dr Lindi Adie would like her to do.

## 2015-09-07 NOTE — Progress Notes (Signed)
BP 120/69 (BP Location: Right Arm, Patient Position: Sitting, Cuff Size: Normal)   Pulse 91   Temp 97.9 F (36.6 C) (Oral)   Ht '5\' 2"'$  (1.575 m)   Wt 148 lb 3.2 oz (67.2 kg)   BMI 27.11 kg/m    Subjective:    Patient ID: Heidi Walls, female    DOB: 1955-03-06, 60 y.o.   MRN: [id number]  Heidi Walls is a 60 y.o. female presenting on 09/07/2015 for Medication Refill  HPI Patient here to be established as new patient at [place].  This patient is known to me from [facility]. She is currently undergoing breast cancer treatment. She has stage IA (T1c, N0, M0).  Dr. Lindi Adie in [place] is her oncologist. She had surgery under Dr. Excell Seltzer.  She has healed quite well from her surgery which was June 16th, 2017.  She is doing very well otherwise. Her blood pressure has been well controlled. Her medications are doing well for anxiety, depression, reflux, osteoarthritis. She would like to go back on Evista for her osteoporosis. She is forgetting to take Fosamax weekly. She is need refills on medicines.  Relevant past medical, surgical, family and social history reviewed and updated as indicated. Interim medical history since our last visit reviewed. Allergies and medications reviewed and updated.   Data reviewed from any sources in EPIC.  Review of Systems  Constitutional: Positive for activity change and fatigue. Negative for fever and unexpected weight change.  HENT: Positive for rhinorrhea.   Eyes: Negative.   Respiratory: Negative.  Negative for apnea, cough, shortness of breath and wheezing.   Cardiovascular: Negative.  Negative for chest pain, palpitations and leg swelling.  Gastrointestinal: Positive for diarrhea, nausea and vomiting. Negative for abdominal pain.  Endocrine: Negative.   Genitourinary: Negative.  Negative for dysuria.  Musculoskeletal: Positive for arthralgias.  Skin: Negative.  Negative for pallor and rash.    Allergic/Immunologic: Positive for immunocompromised state.  Neurological: Negative.   Psychiatric/Behavioral: Negative for suicidal ideas. The patient is nervous/anxious.     Per HPI unless specifically indicated above  Social History   Social History  . Marital status: Married    Spouse name: N/A  . Number of children: N/A  . Years of education: N/A   Occupational History  . Not on file.   Social History Main Topics  . Smoking status: Former Research scientist (life sciences)  . Smokeless tobacco: Never Used  . Alcohol use Yes     Comment: social  . Drug use: No  . Sexual activity: Not on file   Other Topics Concern  . Not on file   Social History Narrative  . No narrative on file    Past Surgical History:  Procedure Laterality Date  . BREAST RECONSTRUCTION WITH PLACEMENT OF TISSUE EXPANDER AND FLEX HD (ACELLULAR HYDRATED DERMIS) Left 06/24/2015   Procedure: LEFT BREAST RECONSTRUCTION WITH PLACEMENT OF TISSUE EXPANDER AND  ACELLULAR HYDRATED DERMIS (CORTIVA);  Surgeon: Irene Limbo, MD;  Location: [place];  Service: Plastics;  Laterality: Left;  . KNEE SURGERY  2000  . MASTECTOMY W/ SENTINEL NODE BIOPSY Left 06/24/2015   Procedure: LEFT TOTAL MASTECTOMY WITH SENTINEL LYMPH NODE BIOPSY;  Surgeon: Excell Seltzer, MD;  Location: [place];  Service: General;  Laterality: Left;  . PORTACATH PLACEMENT Right 06/24/2015   Procedure: INSERTION PORT-A-CATH;  Surgeon: Excell Seltzer, MD;  Location: [place];  Service: General;  Laterality: Right;    Family History  Problem Relation Age of Onset  . Heart failure Mother   . Diabetes Mother   . Hypertension Mother   . Hypertension Father   . Breast cancer Maternal Grandmother       Medication List       Accurate as of 09/07/15  2:29 PM. Always use your most recent med list.          ALPRAZolam 0.25 MG tablet Commonly known as:  XANAX Take 1 tablet (0.25 mg total) by mouth 3 (three)  times daily as needed.   ARIPiprazole 2 MG tablet Commonly known as:  ABILIFY Take 2 mg by mouth daily.   BIOTIN 5000 5 MG Caps Generic drug:  Biotin Take 5,000 mg by mouth daily.   citalopram 20 MG tablet Commonly known as:  CELEXA Take 20 mg by mouth every morning.   cyclobenzaprine 10 MG tablet Commonly known as:  FLEXERIL prn   dexamethasone 4 MG tablet Commonly known as:  DECADRON TAKE 1 TABLET BY MOUTH EVERY DAY START THE DAY BEFORE TAXOTERE,THEN AGAIN THE DAY AFTER CHEMO.   diphenoxylate-atropine 2.5-0.025 MG tablet Commonly known as:  LOMOTIL Take 1 tablet by mouth 4 (four) times daily as needed for diarrhea or loose stools.   esomeprazole 20 MG capsule Commonly known as:  NEXIUM Take 20 mg by mouth 2 (two) times daily before a meal.   ferrous sulfate 325 (65 FE) MG EC tablet Take 325 mg by mouth 3 (three) times daily with meals.   Fish Oil 1200 MG Caps Take 1,200 mg by mouth daily.   fluconazole 100 MG tablet Commonly known as:  DIFLUCAN Take 1 tablet (100 mg total) by mouth daily.   hydrochlorothiazide 12.5 MG tablet Commonly known as:  HYDRODIURIL Take 1 tablet (12.5 mg total) by mouth daily.   HYDROcodone-acetaminophen 5-325 MG tablet Commonly known as:  NORCO/VICODIN TAKE 1 TABLET BY MOUTH EVERY 6 HOURS AS NEEDED FOR MODERATE TO SEVERE PAIN   lidocaine-prilocaine cream Commonly known as:  EMLA Apply to affected area once   lisinopril 5 MG tablet Commonly known as:  PRINIVIL,ZESTRIL Take 1 tablet (5 mg total) by mouth daily.   LORazepam 0.5 MG tablet Commonly known as:  ATIVAN Take 1 tablet (0.5 mg total) by mouth every 6 (six) hours as needed (Nausea or vomiting).   MELOXICAM PO Take 7.5 mg by mouth.   multivitamin tablet Take 1 tablet by mouth daily.   ondansetron 8 MG tablet Commonly known as:  ZOFRAN Take 1 tablet (8 mg total) by mouth 2 (two) times daily as needed for refractory nausea / vomiting. Start on day 3 after chemo.     potassium chloride SA 20 MEQ tablet Commonly known as:  K-DUR,KLOR-CON Take 1 tablet (20 mEq total) by mouth 2 (two) times daily.   prochlorperazine 10 MG tablet Commonly known as:  COMPAZINE Take 1 tablet (10 mg total) by mouth every 6 (six) hours as needed (Nausea or vomiting).   raloxifene 60 MG tablet Commonly known as:  EVISTA Take 1 tablet (60 mg total) by mouth daily.   vitamin C 1000 MG tablet Take 1,000 mg by mouth daily.          Objective:    BP 120/69 (BP Location: Right Arm, Patient Position: Sitting, Cuff Size: Normal)   Pulse 91   Temp 97.9 F (36.6 C) (Oral)   Ht '5\' 2"'$  (1.575 m)   Wt 148 lb 3.2 oz (67.2 kg)   BMI 27.11 kg/m  Allergies  Allergen Reactions  . Codeine Shortness Of Breath    Pt reports this was 30 years ago.   Wt Readings from Last 3 Encounters:  09/07/15 148 lb 3.2 oz (67.2 kg)  09/05/15 143 lb 9.6 oz (65.1 kg)  08/15/15 148 lb (67.1 kg)    Physical Exam  Results for orders placed or performed in visit on 09/05/15  CBC with Differential  Result Value Ref Range   WBC 6.4 3.9 - 10.3 10e3/uL   NEUT# 4.0 1.5 - 6.5 10e3/uL   HGB 8.9 (L) 11.6 - 15.9 g/dL   HCT 27.4 (L) 34.8 - 46.6 %   Platelets 366 145 - 400 10e3/uL   MCV 85.0 79.5 - 101.0 fL   MCH 27.6 25.1 - 34.0 pg   MCHC 32.5 31.5 - 36.0 g/dL   RBC 3.22 (L) 3.70 - 5.45 10e6/uL   RDW 14.2 11.2 - 14.5 %   lymph# 1.8 0.9 - 3.3 10e3/uL   MONO# 0.6 0.1 - 0.9 10e3/uL   Eosinophils Absolute 0.1 0.0 - 0.5 10e3/uL   Basophils Absolute 0.0 0.0 - 0.1 10e3/uL   NEUT% 61.7 38.4 - 76.8 %   LYMPH% 27.7 14.0 - 49.7 %   MONO% 9.1 0.0 - 14.0 %   EOS% 0.9 0.0 - 7.0 %   BASO% 0.6 0.0 - 2.0 %  Comprehensive metabolic panel  Result Value Ref Range   Sodium 140 136 - 145 mEq/L   Potassium 2.9 (LL) 3.5 - 5.1 mEq/L   Chloride 103 98 - 109 mEq/L   CO2 25 22 - 29 mEq/L   Glucose 103 70 - 140 mg/dl   BUN 12.9 7.0 - 26.0 mg/dL   Creatinine 0.8 0.6 - 1.1 mg/dL   Total Bilirubin <0.30 0.20 -  1.20 mg/dL   Alkaline Phosphatase 80 40 - 150 U/L   AST 23 5 - 34 U/L   ALT 26 0 - 55 U/L   Total Protein 6.6 6.4 - 8.3 g/dL   Albumin 3.6 3.5 - 5.0 g/dL   Calcium 9.3 8.4 - 10.4 mg/dL   Anion Gap 12 (H) 3 - 11 mEq/L   EGFR 78 (L) >90 ml/min/1.73 m2      Assessment & Plan:   1. Osteoporosis Weight bearing exercise - raloxifene (EVISTA) 60 MG tablet; Take 1 tablet (60 mg total) by mouth daily.  Dispense: 30 tablet; Refill: 11  2. Generalized anxiety disorder Stress reduction when possible, has excellent support system - ALPRAZolam (XANAX) 0.25 MG tablet; Take 1 tablet (0.25 mg total) by mouth 3 (three) times daily as needed.  Dispense: 90 tablet; Refill: 2  3. Primary osteoarthritis of right knee Continue mobic daily  4. Gastroesophageal reflux disease without esophagitis Continue omeprazole daily  5. Depression Continue celexa, work with counselors at [facility]  6. Irritability Continue abilify daily  7. Essential hypertension Low salt diet - hydrochlorothiazide (HYDRODIURIL) 12.5 MG tablet; Take 1 tablet (12.5 mg total) by mouth daily.  Dispense: 90 tablet; Refill: 3 - lisinopril (PRINIVIL,ZESTRIL) 5 MG tablet; Take 1 tablet (5 mg total) by mouth daily.  Dispense: 90 tablet; Refill: 3   Continue all other maintenance medications as listed above.  Follow up plan: Return in about 3 months (around 12/08/2015) for meds.  Terald Sleeper PA-C [facility] [address]  [place], [place] [address] [phone]   09/07/2015, 2:29 PM

## 2015-09-07 NOTE — Patient Instructions (Signed)
Osteoporosis  Osteoporosis is the thinning and loss of density in the bones. Osteoporosis makes the bones more brittle, fragile, and likely to break (fracture). Over time, osteoporosis can cause the bones to become so weak that they fracture after a simple fall. The bones most likely to fracture are the bones in the hip, wrist, and spine.  CAUSES   The exact cause is not known.  RISK FACTORS  Anyone can develop osteoporosis. You may be at greater risk if you have a family history of the condition or have poor nutrition. You may also have a higher risk if you are:   · Female.    · 50 years old or older.  · A smoker.  · Not physically active.    · White or Asian.  · Slender.  SIGNS AND SYMPTOMS   A fracture might be the first sign of the disease, especially if it results from a fall or injury that would not usually cause a bone to break. Other signs and symptoms include:   · Low back and neck pain.  · Stooped posture.  · Height loss.  DIAGNOSIS   To make a diagnosis, your health care provider may:  · Take a medical history.  · Perform a physical exam.  · Order tests, such as:    A bone mineral density test.    A dual-energy X-ray absorptiometry test.  TREATMENT   The goal of osteoporosis treatment is to strengthen your bones to reduce your risk of a fracture. Treatment may involve:  · Making lifestyle changes, such as:    Eating a diet rich in calcium.    Doing weight-bearing and muscle-strengthening exercises.    Stopping tobacco use.    Limiting alcohol intake.  · Taking medicine to slow the process of bone loss or to increase bone density.  · Monitoring your levels of calcium and vitamin D.  HOME CARE INSTRUCTIONS  · Include calcium and vitamin D in your diet. Calcium is important for bone health, and vitamin D helps the body absorb calcium.  · Perform weight-bearing and muscle-strengthening exercises as directed by your health care provider.  · Do not use any tobacco products, including cigarettes, chewing  tobacco, and electronic cigarettes. If you need help quitting, ask your health care provider.  · Limit your alcohol intake.  · Take medicines only as directed by your health care provider.  · Keep all follow-up visits as directed by your health care provider. This is important.  · Take precautions at home to lower your risk of falling, such as:    Keeping rooms well lit and clutter free.    Installing safety rails on stairs.    Using rubber mats in the bathroom and other areas that are often wet or slippery.  SEEK IMMEDIATE MEDICAL CARE IF:   You fall or injure yourself.      This information is not intended to replace advice given to you by your health care provider. Make sure you discuss any questions you have with your health care provider.     Document Released: 10/04/2004 Document Revised: 01/15/2014 Document Reviewed: 06/04/2013  Elsevier Interactive Patient Education ©2016 [employer]vier Inc[employer]

## 2015-09-07 NOTE — Telephone Encounter (Signed)
Pt will come today at 3:30 for neulasta injection

## 2015-09-07 NOTE — Patient Instructions (Signed)
Pegfilgrastim injection What is this medicine? PEGFILGRASTIM (PEG fil gra stim) is a long-acting granulocyte colony-stimulating factor that stimulates the growth of neutrophils, a type of white blood cell important in the body's fight against infection. It is used to reduce the incidence of fever and infection in patients with certain types of cancer who are receiving chemotherapy that affects the bone marrow, and to increase survival after being exposed to high doses of radiation. This medicine may be used for other purposes; ask your health care provider or pharmacist if you have questions. What should I tell my health care provider before I take this medicine? They need to know if you have any of these conditions: -kidney disease -latex allergy -ongoing radiation therapy -sickle cell disease -skin reactions to acrylic adhesives (On-Body Injector only) -an unusual or allergic reaction to pegfilgrastim, filgrastim, other medicines, foods, dyes, or preservatives -pregnant or trying to get pregnant -breast-feeding How should I use this medicine? This medicine is for injection under the skin. If you get this medicine at home, you will be taught how to prepare and give the pre-filled syringe or how to use the On-body Injector. Refer to the patient Instructions for Use for detailed instructions. Use exactly as directed. Take your medicine at regular intervals. Do not take your medicine more often than directed. It is important that you put your used needles and syringes in a special sharps container. Do not put them in a trash can. If you do not have a sharps container, call your pharmacist or healthcare provider to get one. Talk to your pediatrician regarding the use of this medicine in children. While this drug may be prescribed for selected conditions, precautions do apply. Overdosage: If you think you have taken too much of this medicine contact a poison control center or emergency room at  once. NOTE: This medicine is only for you. Do not share this medicine with others. What if I miss a dose? It is important not to miss your dose. Call your doctor or health care professional if you miss your dose. If you miss a dose due to an On-body Injector failure or leakage, a new dose should be administered as soon as possible using a single prefilled syringe for manual use. What may interact with this medicine? Interactions have not been studied. Give your health care provider a list of all the medicines, herbs, non-prescription drugs, or dietary supplements you use. Also tell them if you smoke, drink alcohol, or use illegal drugs. Some items may interact with your medicine. This list may not describe all possible interactions. Give your health care provider a list of all the medicines, herbs, non-prescription drugs, or dietary supplements you use. Also tell them if you smoke, drink alcohol, or use illegal drugs. Some items may interact with your medicine. What should I watch for while using this medicine? You may need blood work done while you are taking this medicine. If you are going to need a MRI, CT scan, or other procedure, tell your doctor that you are using this medicine (On-Body Injector only). What side effects may I notice from receiving this medicine? Side effects that you should report to your doctor or health care professional as soon as possible: -allergic reactions like skin rash, itching or hives, swelling of the face, lips, or tongue -dizziness -fever -pain, redness, or irritation at site where injected -pinpoint red spots on the skin -red or dark-brown urine -shortness of breath or breathing problems -stomach or side pain, or pain   at the shoulder -swelling -tiredness -trouble passing urine or change in the amount of urine Side effects that usually do not require medical attention (report to your doctor or health care professional if they continue or are  bothersome): -bone pain -muscle pain This list may not describe all possible side effects. Call your doctor for medical advice about side effects. You may report side effects to FDA at 1-800-FDA-1088. Where should I keep my medicine? Keep out of the reach of children. Store pre-filled syringes in a refrigerator between 2 and 8 degrees C (36 and 46 degrees F). Do not freeze. Keep in carton to protect from light. Throw away this medicine if it is left out of the refrigerator for more than 48 hours. Throw away any unused medicine after the expiration date. NOTE: This sheet is a summary. It may not cover all possible information. If you have questions about this medicine, talk to your doctor, pharmacist, or health care provider.    2016, Elsevier/Gold Standard. (2014-01-14 14:30:14)  

## 2015-09-15 ENCOUNTER — Telehealth: Payer: Self-pay | Admitting: Physician Assistant

## 2015-09-15 NOTE — Telephone Encounter (Signed)
Will call back.

## 2015-09-26 ENCOUNTER — Encounter: Payer: Self-pay | Admitting: Hematology and Oncology

## 2015-09-26 ENCOUNTER — Encounter: Payer: Self-pay | Admitting: *Deleted

## 2015-09-26 ENCOUNTER — Other Ambulatory Visit (HOSPITAL_BASED_OUTPATIENT_CLINIC_OR_DEPARTMENT_OTHER): Payer: BLUE CROSS/BLUE SHIELD

## 2015-09-26 ENCOUNTER — Ambulatory Visit (HOSPITAL_BASED_OUTPATIENT_CLINIC_OR_DEPARTMENT_OTHER): Payer: BLUE CROSS/BLUE SHIELD

## 2015-09-26 ENCOUNTER — Ambulatory Visit (HOSPITAL_BASED_OUTPATIENT_CLINIC_OR_DEPARTMENT_OTHER): Payer: BLUE CROSS/BLUE SHIELD | Admitting: Hematology and Oncology

## 2015-09-26 VITALS — HR 87

## 2015-09-26 DIAGNOSIS — E876 Hypokalemia: Secondary | ICD-10-CM | POA: Diagnosis not present

## 2015-09-26 DIAGNOSIS — Z5111 Encounter for antineoplastic chemotherapy: Secondary | ICD-10-CM | POA: Diagnosis not present

## 2015-09-26 DIAGNOSIS — C50212 Malignant neoplasm of upper-inner quadrant of left female breast: Secondary | ICD-10-CM

## 2015-09-26 DIAGNOSIS — R197 Diarrhea, unspecified: Secondary | ICD-10-CM | POA: Diagnosis not present

## 2015-09-26 DIAGNOSIS — R530 Neoplastic (malignant) related fatigue: Secondary | ICD-10-CM

## 2015-09-26 DIAGNOSIS — D6481 Anemia due to antineoplastic chemotherapy: Secondary | ICD-10-CM

## 2015-09-26 DIAGNOSIS — Z5112 Encounter for antineoplastic immunotherapy: Secondary | ICD-10-CM | POA: Diagnosis not present

## 2015-09-26 DIAGNOSIS — Z170 Estrogen receptor positive status [ER+]: Secondary | ICD-10-CM

## 2015-09-26 DIAGNOSIS — R110 Nausea: Secondary | ICD-10-CM

## 2015-09-26 LAB — CBC WITH DIFFERENTIAL/PLATELET
BASO%: 0.4 % (ref 0.0–2.0)
Basophils Absolute: 0 10*3/uL (ref 0.0–0.1)
EOS%: 0.1 % (ref 0.0–7.0)
Eosinophils Absolute: 0 10*3/uL (ref 0.0–0.5)
HEMATOCRIT: 24.5 % — AB (ref 34.8–46.6)
HGB: 7.9 g/dL — ABNORMAL LOW (ref 11.6–15.9)
LYMPH#: 1.1 10*3/uL (ref 0.9–3.3)
LYMPH%: 21.2 % (ref 14.0–49.7)
MCH: 28.1 pg (ref 25.1–34.0)
MCHC: 32.2 g/dL (ref 31.5–36.0)
MCV: 87.3 fL (ref 79.5–101.0)
MONO#: 0.4 10*3/uL (ref 0.1–0.9)
MONO%: 8.4 % (ref 0.0–14.0)
NEUT%: 69.9 % (ref 38.4–76.8)
NEUTROS ABS: 3.5 10*3/uL (ref 1.5–6.5)
PLATELETS: 143 10*3/uL — AB (ref 145–400)
RBC: 2.81 10*6/uL — ABNORMAL LOW (ref 3.70–5.45)
RDW: 19.3 % — ABNORMAL HIGH (ref 11.2–14.5)
WBC: 5.1 10*3/uL (ref 3.9–10.3)

## 2015-09-26 LAB — COMPREHENSIVE METABOLIC PANEL
ALBUMIN: 3.6 g/dL (ref 3.5–5.0)
ALT: 24 U/L (ref 0–55)
ANION GAP: 10 meq/L (ref 3–11)
AST: 23 U/L (ref 5–34)
Alkaline Phosphatase: 77 U/L (ref 40–150)
BILIRUBIN TOTAL: 0.3 mg/dL (ref 0.20–1.20)
BUN: 12.5 mg/dL (ref 7.0–26.0)
CALCIUM: 8.7 mg/dL (ref 8.4–10.4)
CO2: 24 mEq/L (ref 22–29)
CREATININE: 0.7 mg/dL (ref 0.6–1.1)
Chloride: 109 mEq/L (ref 98–109)
EGFR: >90 mL/min/{1.73_m2} (ref >90)
Glucose: 97 mg/dl (ref 70–140)
Potassium: 3.3 mEq/L — ABNORMAL LOW (ref 3.5–5.1)
Sodium: 143 mEq/L (ref 136–145)
TOTAL PROTEIN: 6.4 g/dL (ref 6.4–8.3)

## 2015-09-26 MED ORDER — ACETAMINOPHEN 325 MG PO TABS
650.0000 mg | ORAL_TABLET | Freq: Once | ORAL | Status: AC
  Administered 2015-09-26: 650 mg via ORAL

## 2015-09-26 MED ORDER — PEGFILGRASTIM 6 MG/0.6ML ~~LOC~~ PSKT
6.0000 mg | PREFILLED_SYRINGE | Freq: Once | SUBCUTANEOUS | Status: AC
  Administered 2015-09-26: 6 mg via SUBCUTANEOUS
  Filled 2015-09-26: qty 0.6

## 2015-09-26 MED ORDER — OCTREOTIDE ACETATE 100 MCG/ML IJ SOLN: 50.0000 ug | Freq: Once | INTRAMUSCULAR | Status: DC

## 2015-09-26 MED ORDER — PERTUZUMAB CHEMO INJECTION 420 MG/14ML
420.0000 mg | Freq: Once | INTRAVENOUS | Status: AC
  Administered 2015-09-26: 420 mg via INTRAVENOUS
  Filled 2015-09-26: qty 14

## 2015-09-26 MED ORDER — DIPHENHYDRAMINE HCL 25 MG PO CAPS
50.0000 mg | ORAL_CAPSULE | Freq: Once | ORAL | Status: AC
Start: 2015-09-26 — End: 2015-09-26
  Administered 2015-09-26: 50 mg via ORAL

## 2015-09-26 MED ORDER — CARBOPLATIN CHEMO INJECTION 600 MG/60ML
427.2000 mg | Freq: Once | INTRAVENOUS | Status: AC
  Administered 2015-09-26: 430 mg via INTRAVENOUS
  Filled 2015-09-26: qty 43

## 2015-09-26 MED ORDER — SODIUM CHLORIDE 0.9% FLUSH
10.0000 mL | INTRAVENOUS | Status: DC | PRN
  Administered 2015-09-26: 10 mL
  Filled 2015-09-26: qty 10

## 2015-09-26 MED ORDER — HEPARIN SOD (PORK) LOCK FLUSH 100 UNIT/ML IV SOLN
500.0000 [IU] | Freq: Once | INTRAVENOUS | Status: AC | PRN
  Administered 2015-09-26: 500 [IU]
  Filled 2015-09-26: qty 5

## 2015-09-26 MED ORDER — SODIUM CHLORIDE 0.9 % IV SOLN
50.0000 mg/m2 | Freq: Once | INTRAVENOUS | Status: AC
  Administered 2015-09-26: 90 mg via INTRAVENOUS
  Filled 2015-09-26: qty 9

## 2015-09-26 MED ORDER — ACETAMINOPHEN 325 MG PO TABS
ORAL_TABLET | ORAL | Status: AC
  Filled 2015-09-26: qty 2

## 2015-09-26 MED ORDER — DIPHENHYDRAMINE HCL 25 MG PO CAPS
ORAL_CAPSULE | ORAL | Status: AC
  Filled 2015-09-26: qty 2

## 2015-09-26 MED ORDER — PALONOSETRON HCL INJECTION 0.25 MG/5ML
INTRAVENOUS | Status: AC
  Filled 2015-09-26: qty 5

## 2015-09-26 MED ORDER — OCTREOTIDE ACETATE 100 MCG/ML IJ SOLN
50.0000 ug | Freq: Once | INTRAMUSCULAR | Status: AC
Start: 2015-09-26 — End: 2015-09-26
  Administered 2015-09-26: 50 ug via SUBCUTANEOUS
  Filled 2015-09-26: qty 0.5

## 2015-09-26 MED ORDER — POTASSIUM CHLORIDE CRYS ER 20 MEQ PO TBCR: 20.0000 meq | EXTENDED_RELEASE_TABLET | Freq: Two times a day (BID) | ORAL | 2 refills | Status: DC

## 2015-09-26 MED ORDER — PALONOSETRON HCL INJECTION 0.25 MG/5ML
0.2500 mg | Freq: Once | INTRAVENOUS | Status: AC
  Administered 2015-09-26: 0.25 mg via INTRAVENOUS

## 2015-09-26 MED ORDER — TRASTUZUMAB CHEMO 150 MG IV SOLR
6.0000 mg/kg | Freq: Once | INTRAVENOUS | Status: AC
  Administered 2015-09-26: 420 mg via INTRAVENOUS
  Filled 2015-09-26: qty 20

## 2015-09-26 MED ORDER — SODIUM CHLORIDE 0.9 % IV SOLN
Freq: Once | INTRAVENOUS | Status: AC
  Administered 2015-09-26: 10:00:00 via INTRAVENOUS
  Filled 2015-09-26: qty 5

## 2015-09-26 MED ORDER — SODIUM CHLORIDE 0.9 % IV SOLN
Freq: Once | INTRAVENOUS | Status: AC
  Administered 2015-09-26: 10:00:00 via INTRAVENOUS

## 2015-09-26 NOTE — Patient Instructions (Signed)
[facility] Discharge Instructions for Patients Receiving Chemotherapy  Today you received the following chemotherapy agents; Herceptin, Perjeta, Taxotere and Carboplatin.   To help prevent nausea and vomiting after your treatment, we encourage you to take your nausea medication as directed.    If you develop nausea and vomiting that is not controlled by your nausea medication, call the clinic.   BELOW ARE SYMPTOMS THAT SHOULD BE REPORTED IMMEDIATELY:  *FEVER GREATER THAN 100.5 F  *CHILLS WITH OR WITHOUT FEVER  NAUSEA AND VOMITING THAT IS NOT CONTROLLED WITH YOUR NAUSEA MEDICATION  *UNUSUAL SHORTNESS OF BREATH  *UNUSUAL BRUISING OR BLEEDING  TENDERNESS IN MOUTH AND THROAT WITH OR WITHOUT PRESENCE OF ULCERS  *URINARY PROBLEMS  *BOWEL PROBLEMS  UNUSUAL RASH Items with * indicate a potential emergency and should be followed up as soon as possible.  Feel free to call the clinic you have any questions or concerns. The clinic phone number is [phone].  Please show the CHEMO ALERT CARD at check-in to the Emergency Department and triage nurse.   

## 2015-09-26 NOTE — Progress Notes (Signed)
Patient Care Team: Terald Sleeper, PA as PCP - General ([place]) Sylvan Cheese, NP as Nurse Practitioner (Hematology and Oncology)  DIAGNOSIS: Breast cancer of upper-inner quadrant of left female breast [facility])   Staging form: Breast, AJCC 7th Edition   - Clinical stage from 06/08/2015: Stage IA (T1c, N0, M0) - Unsigned         Staging comments: Staged at breast conference on 5.31.17  SUMMARY OF ONCOLOGIC HISTORY:   Breast cancer of upper-inner quadrant of left female breast ([place])   05/26/2015 Initial Diagnosis    Screen detected left breast asymmetry (posterior 1.6 cm): Grade 2-3 IDC ER/PR positive HER-2 positive Ki-67 20% plus calcs (span 6.1 cm): High-grade DCIS with suspicious foci of invasion (4.2 cm apart)      06/24/2015 Surgery    Left simple mastectomy with reconstruction: IDC grade 3, 2.5 cm, with high-grade DCIS, ALH, LVI present, margins negative, 0/1 sentinel node negative, T2 N0 stage II a, ER 100%, PR 5%, HER-2 positive ratio 1.56 with average copy #8.25, Ki-67 20%      07/25/2015 -  Chemotherapy    Adjuvant chemotherapy with TCH Perjeta 6 cycles followed by Herceptin maintenance for 1 year       CHIEF COMPLIANT: Persistent diarrhea 5-6 times every day including at night  INTERVAL HISTORY: Heidi Walls is a 28 with above-mentioned history of left breast cancer currently on adjuvant chemotherapy with TCH Perjeta. Today is cycle 4 of chemotherapy. She continues to have diarrhea 5-6 times a day especially at nighttime. She takes Imodium and Lomotil but both of them are not helping her symptoms. She is also moderately fatigued. Fatigue lasts for the entire week after chemotherapy and slowly she gets better the subsequent weeks. Denies any nausea. Denies any neuropathy  REVIEW OF SYSTEMS:   Constitutional: Denies fevers, chills or abnormal weight loss Eyes: Denies blurriness of vision Ears, nose, mouth, throat, and face: Denies mucositis or sore  throat Respiratory: Denies cough, dyspnea or wheezes Cardiovascular: Denies palpitation, chest discomfort Gastrointestinal:  Profound diarrhea Skin: Denies abnormal skin rashes Lymphatics: Denies new lymphadenopathy or easy bruising Neurological:Denies numbness, tingling or new weaknesses Behavioral/Psych: Mood is stable, no new changes  Extremities: No lower extremity edema  All other systems were reviewed with the patient and are negative.  I have reviewed the past medical history, past surgical history, social history and family history with the patient and they are unchanged from previous note.  ALLERGIES:  is allergic to codeine.  MEDICATIONS:  Current Outpatient Prescriptions  Medication Sig Dispense Refill  . ALPRAZolam (XANAX) 0.25 MG tablet Take 1 tablet (0.25 mg total) by mouth 3 (three) times daily as needed. 90 tablet 2  . ARIPiprazole (ABILIFY) 2 MG tablet Take 2 mg by mouth daily.    . Ascorbic Acid (VITAMIN C) 1000 MG tablet Take 1,000 mg by mouth daily.    . Biotin (BIOTIN 5000) 5 MG CAPS Take 5,000 mg by mouth daily.    . citalopram (CELEXA) 20 MG tablet Take 20 mg by mouth every morning.    . cyclobenzaprine (FLEXERIL) 10 MG tablet prn  1  . dexamethasone (DECADRON) 4 MG tablet TAKE 1 TABLET BY MOUTH EVERY DAY START THE DAY BEFORE TAXOTERE,THEN AGAIN THE DAY AFTER CHEMO.  1  . diphenoxylate-atropine (LOMOTIL) 2.5-0.025 MG tablet Take 1 tablet by mouth 4 (four) times daily as needed for diarrhea or loose stools. 30 tablet 2  . esomeprazole (NEXIUM) 20 MG capsule Take 20 mg  by mouth 2 (two) times daily before a meal.     . ferrous sulfate 325 (65 FE) MG EC tablet Take 325 mg by mouth 3 (three) times daily with meals.    . fluconazole (DIFLUCAN) 100 MG tablet Take 1 tablet (100 mg total) by mouth daily. 14 tablet 0  . hydrochlorothiazide (HYDRODIURIL) 12.5 MG tablet Take 1 tablet (12.5 mg total) by mouth daily. 90 tablet 3  . HYDROcodone-acetaminophen (NORCO/VICODIN)  5-325 MG tablet TAKE 1 TABLET BY MOUTH EVERY 6 HOURS AS NEEDED FOR MODERATE TO SEVERE PAIN  0  . lidocaine-prilocaine (EMLA) cream Apply to affected area once 30 g 3  . lisinopril (PRINIVIL,ZESTRIL) 5 MG tablet Take 1 tablet (5 mg total) by mouth daily. 90 tablet 3  . LORazepam (ATIVAN) 0.5 MG tablet Take 1 tablet (0.5 mg total) by mouth every 6 (six) hours as needed (Nausea or vomiting). 30 tablet 0  . MELOXICAM PO Take 7.5 mg by mouth.     . Multiple Vitamin (MULTIVITAMIN) tablet Take 1 tablet by mouth daily.    . Omega-3 Fatty Acids (FISH OIL) 1200 MG CAPS Take 1,200 mg by mouth daily.    . ondansetron (ZOFRAN) 8 MG tablet Take 1 tablet (8 mg total) by mouth 2 (two) times daily as needed for refractory nausea / vomiting. Start on day 3 after chemo. 30 tablet 1  . potassium chloride SA (K-DUR,KLOR-CON) 20 MEQ tablet Take 1 tablet (20 mEq total) by mouth 2 (two) times daily. 30 tablet 0  . prochlorperazine (COMPAZINE) 10 MG tablet Take 1 tablet (10 mg total) by mouth every 6 (six) hours as needed (Nausea or vomiting). 30 tablet 1  . raloxifene (EVISTA) 60 MG tablet Take 1 tablet (60 mg total) by mouth daily. 30 tablet 11   No current facility-administered medications for this visit.     PHYSICAL EXAMINATION: ECOG PERFORMANCE STATUS: 1 - Symptomatic but completely ambulatory  Vitals:   09/26/15 0841  BP: (!) 147/67  Pulse: (!) 105  Resp: 18  Temp: 98 F (36.7 C)   Filed Weights   09/26/15 0841  Weight: 141 lb 8 oz (64.2 kg)    GENERAL:alert, no distress and comfortable SKIN: skin color, texture, turgor are normal, no rashes or significant lesions EYES: normal, Conjunctiva are pink and non-injected, sclera clear OROPHARYNX:no exudate, no erythema and lips, buccal mucosa, and tongue normal  NECK: supple, thyroid normal size, non-tender, without nodularity LYMPH:  no palpable lymphadenopathy in the cervical, axillary or inguinal LUNGS: clear to auscultation and percussion with  normal breathing effort HEART: regular rate & rhythm and no murmurs and no lower extremity edema ABDOMEN:abdomen soft, non-tender and normal bowel sounds MUSCULOSKELETAL:no cyanosis of digits and no clubbing  NEURO: alert & oriented x 3 with fluent speech, no focal motor/sensory deficits EXTREMITIES: No lower extremity edema  LABORATORY DATA:  I have reviewed the data as listed   Chemistry      Component Value Date/Time   NA 140 09/05/2015 0825   K 2.9 (LL) 09/05/2015 0825   CO2 25 09/05/2015 0825   BUN 12.9 09/05/2015 0825   CREATININE 0.8 09/05/2015 0825      Component Value Date/Time   CALCIUM 9.3 09/05/2015 0825   ALKPHOS 80 09/05/2015 0825   AST 23 09/05/2015 0825   ALT 26 09/05/2015 0825   BILITOT <0.30 09/05/2015 0825       Lab Results  Component Value Date   WBC 5.1 09/26/2015   HGB 7.9 (L) 09/26/2015  HCT 24.5 (L) 09/26/2015   MCV 87.3 09/26/2015   PLT 143 (L) 09/26/2015   NEUTROABS 3.5 09/26/2015     ASSESSMENT & PLAN:  Breast cancer of upper-inner quadrant of left female breast ([place]) Left simple mastectomy with reconstruction 06/24/2015: IDC grade 3, 2.5 cm, with high-grade DCIS, ALH, LVI present, margins negative, 0/1 sentinel node negative, , ER 100%, PR 5%, HER-2 positive ratio 1.56 with average copy #8.25, Ki-67 20%  Pathologic stage:T2 N0 stage II a Treatment plan: 1. Adjuvant chemotherapy with TCHP followed by Herceptin maintenance for 1 year 2. Followed by adjuvant hormonal therapy ---------------------------------------------------------------------------------------------------------- Current treatment: Cycle 4day [place] Perjeta Echocardiogram 07/27/2015 60-65%  Chemotherapy toxicities: 1. Nausea grade 1 2. Thrush: resolved withDiflucan 3. Diarrhea grade 2: Currently on Lomotil and Imodium daily. We will give her a dose of octreotide short-acting subcutaneous injection 50 g today. If she responds to this, and on plan on administering  octreotide LAR. 4. Dry cough: Probably related to fungal infection: Diflucan should help. 5. Severe fatigue 6. Hypokalemia: Patient will take Potassium-rich foods 7. Alopecia 8. Profound anemia:hemoglobin 7.9. I would decrease the dosage of chemotherapy from cycle 3 and once again with cycle 4. Patient takes oral iron every day.  We are monitoringher very closely for toxicities. Return to clinic in 3weeksfor cycle 5   No orders of the defined types were placed in this encounter.  The patient has a good understanding of the overall plan. she agrees with it. she will call with any problems that may develop before the next visit here.   Rulon Eisenmenger, MD 09/26/15

## 2015-09-26 NOTE — Assessment & Plan Note (Signed)
Left simple mastectomy with reconstruction 06/24/2015: IDC grade 3, 2.5 cm, with high-grade DCIS, ALH, LVI present, margins negative, 0/1 sentinel node negative, , ER 100%, PR 5%, HER-2 positive ratio 1.56 with average copy #8.25, Ki-67 20%  Pathologic stage:T2 N0 stage II a Treatment plan: 1. Adjuvant chemotherapy with TCHP followed by Herceptin maintenance for 1 year 2. Followed by adjuvant hormonal therapy ---------------------------------------------------------------------------------------------------------- Current treatment: Cycle 4day [place] Perjeta Echocardiogram 07/27/2015 60-65%  Chemotherapy toxicities: 1. Nausea grade 1 2. Thrush: resolved withDiflucan 3. Diarrhea grade 1-2: Currently on Lomotil and Imodium daily. her diarrhea lasted the entire 3 weeks mostly 2-3 times daily. 4. Dry cough: Probably related to fungal infection: Diflucan should help. 5. Severe fatigue 6. Hypokalemia: Patient will take Potassium-rich foods 7. Alopecia 8. Profound anemia:hemoglobin 8.9. I would decrease the dosage of chemotherapy from cycle 3. Patient takes oral iron every day.  We are monitoringher very closely for toxicities. Return to clinic in 3weeksfor cycle 5

## 2015-09-26 NOTE — Progress Notes (Signed)
Dr. Lindi Adie addressed patient's current labs in his note today.  Will treat with dose modification and pt will pick up a new potassium script today.

## 2015-10-07 ENCOUNTER — Telehealth: Payer: Self-pay | Admitting: Physician Assistant

## 2015-10-07 MED ORDER — VALACYCLOVIR HCL 1 G PO TABS: 1000.0000 mg | ORAL_TABLET | Freq: Two times a day (BID) | ORAL | 6 refills | Status: DC

## 2015-10-10 NOTE — Telephone Encounter (Signed)
Medication sent.

## 2015-10-11 ENCOUNTER — Encounter: Payer: Self-pay | Admitting: *Deleted

## 2015-10-14 ENCOUNTER — Telehealth: Payer: Self-pay | Admitting: Hematology and Oncology

## 2015-10-14 NOTE — Telephone Encounter (Signed)
S/w pt, advised of appt time change on 10/9 from 8.15am to 8.45am.

## 2015-10-17 ENCOUNTER — Ambulatory Visit (HOSPITAL_BASED_OUTPATIENT_CLINIC_OR_DEPARTMENT_OTHER): Payer: BLUE CROSS/BLUE SHIELD | Admitting: Hematology and Oncology

## 2015-10-17 ENCOUNTER — Encounter: Payer: Self-pay | Admitting: Hematology and Oncology

## 2015-10-17 ENCOUNTER — Other Ambulatory Visit (HOSPITAL_BASED_OUTPATIENT_CLINIC_OR_DEPARTMENT_OTHER): Payer: BLUE CROSS/BLUE SHIELD

## 2015-10-17 ENCOUNTER — Encounter: Payer: Self-pay | Admitting: *Deleted

## 2015-10-17 ENCOUNTER — Ambulatory Visit (HOSPITAL_BASED_OUTPATIENT_CLINIC_OR_DEPARTMENT_OTHER): Payer: BLUE CROSS/BLUE SHIELD

## 2015-10-17 VITALS — HR 92

## 2015-10-17 DIAGNOSIS — Z5111 Encounter for antineoplastic chemotherapy: Secondary | ICD-10-CM | POA: Diagnosis not present

## 2015-10-17 DIAGNOSIS — L658 Other specified nonscarring hair loss: Secondary | ICD-10-CM

## 2015-10-17 DIAGNOSIS — C50212 Malignant neoplasm of upper-inner quadrant of left female breast: Secondary | ICD-10-CM

## 2015-10-17 DIAGNOSIS — D6481 Anemia due to antineoplastic chemotherapy: Secondary | ICD-10-CM | POA: Diagnosis not present

## 2015-10-17 DIAGNOSIS — Z170 Estrogen receptor positive status [ER+]: Secondary | ICD-10-CM

## 2015-10-17 DIAGNOSIS — Z23 Encounter for immunization: Secondary | ICD-10-CM

## 2015-10-17 DIAGNOSIS — E876 Hypokalemia: Secondary | ICD-10-CM

## 2015-10-17 DIAGNOSIS — K521 Toxic gastroenteritis and colitis: Secondary | ICD-10-CM | POA: Diagnosis not present

## 2015-10-17 DIAGNOSIS — T451X5A Adverse effect of antineoplastic and immunosuppressive drugs, initial encounter: Secondary | ICD-10-CM

## 2015-10-17 DIAGNOSIS — R110 Nausea: Secondary | ICD-10-CM

## 2015-10-17 DIAGNOSIS — Z5112 Encounter for antineoplastic immunotherapy: Secondary | ICD-10-CM

## 2015-10-17 LAB — CBC WITH DIFFERENTIAL/PLATELET
BASO%: 0.8 % (ref 0.0–2.0)
BASOS ABS: 0 10*3/uL (ref 0.0–0.1)
EOS ABS: 0 10*3/uL (ref 0.0–0.5)
EOS%: 0.6 % (ref 0.0–7.0)
HEMATOCRIT: 23.7 % — AB (ref 34.8–46.6)
HEMOGLOBIN: 7.7 g/dL — AB (ref 11.6–15.9)
LYMPH#: 1 10*3/uL (ref 0.9–3.3)
LYMPH%: 30.3 % (ref 14.0–49.7)
MCH: 30 pg (ref 25.1–34.0)
MCHC: 32.3 g/dL (ref 31.5–36.0)
MCV: 92.9 fL (ref 79.5–101.0)
MONO#: 0.4 10*3/uL (ref 0.1–0.9)
MONO%: 11.8 % (ref 0.0–14.0)
NEUT#: 1.8 10*3/uL (ref 1.5–6.5)
NEUT%: 56.5 % (ref 38.4–76.8)
PLATELETS: 248 10*3/uL (ref 145–400)
RBC: 2.56 10*6/uL — ABNORMAL LOW (ref 3.70–5.45)
RDW: 23.3 % — AB (ref 11.2–14.5)
WBC: 3.2 10*3/uL — ABNORMAL LOW (ref 3.9–10.3)

## 2015-10-17 LAB — COMPREHENSIVE METABOLIC PANEL
ALBUMIN: 3.5 g/dL (ref 3.5–5.0)
ALK PHOS: 64 U/L (ref 40–150)
ALT: 12 U/L (ref 0–55)
AST: 16 U/L (ref 5–34)
Anion Gap: 10 mEq/L (ref 3–11)
BUN: 13.4 mg/dL (ref 7.0–26.0)
CALCIUM: 8.2 mg/dL — AB (ref 8.4–10.4)
CO2: 21 mEq/L — ABNORMAL LOW (ref 22–29)
Chloride: 108 mEq/L (ref 98–109)
Creatinine: 1 mg/dL (ref 0.6–1.1)
EGFR: 64 mL/min/{1.73_m2} — AB (ref >90)
Glucose: 89 mg/dl (ref 70–140)
POTASSIUM: 3.5 meq/L (ref 3.5–5.1)
Sodium: 139 mEq/L (ref 136–145)
Total Protein: 6.3 g/dL — ABNORMAL LOW (ref 6.4–8.3)

## 2015-10-17 MED ORDER — ACETAMINOPHEN 325 MG PO TABS
650.0000 mg | ORAL_TABLET | Freq: Once | ORAL | Status: AC
  Administered 2015-10-17: 650 mg via ORAL

## 2015-10-17 MED ORDER — SODIUM CHLORIDE 0.9 % IV SOLN
50.0000 mg/m2 | Freq: Once | INTRAVENOUS | Status: AC
  Administered 2015-10-17: 90 mg via INTRAVENOUS
  Filled 2015-10-17: qty 9

## 2015-10-17 MED ORDER — PEGFILGRASTIM 6 MG/0.6ML ~~LOC~~ PSKT
6.0000 mg | PREFILLED_SYRINGE | Freq: Once | SUBCUTANEOUS | Status: AC
  Administered 2015-10-17: 6 mg via SUBCUTANEOUS
  Filled 2015-10-17: qty 0.6

## 2015-10-17 MED ORDER — PALONOSETRON HCL INJECTION 0.25 MG/5ML
0.2500 mg | Freq: Once | INTRAVENOUS | Status: AC
  Administered 2015-10-17: 0.25 mg via INTRAVENOUS

## 2015-10-17 MED ORDER — INFLUENZA VAC SPLIT QUAD 0.5 ML IM SUSY
0.5000 mL | PREFILLED_SYRINGE | Freq: Once | INTRAMUSCULAR | Status: AC
  Administered 2015-10-17: 0.5 mL via INTRAMUSCULAR
  Filled 2015-10-17: qty 0.5

## 2015-10-17 MED ORDER — SODIUM CHLORIDE 0.9 % IV SOLN
Freq: Once | INTRAVENOUS | Status: AC
  Administered 2015-10-17: 10:00:00 via INTRAVENOUS
  Filled 2015-10-17: qty 5

## 2015-10-17 MED ORDER — PALONOSETRON HCL INJECTION 0.25 MG/5ML
INTRAVENOUS | Status: AC
  Filled 2015-10-17: qty 5

## 2015-10-17 MED ORDER — SODIUM CHLORIDE 0.9 % IV SOLN
361.6000 mg | Freq: Once | INTRAVENOUS | Status: AC
  Administered 2015-10-17: 360 mg via INTRAVENOUS
  Filled 2015-10-17: qty 36

## 2015-10-17 MED ORDER — SODIUM CHLORIDE 0.9 % IV SOLN
Freq: Once | INTRAVENOUS | Status: AC
  Administered 2015-10-17: 10:00:00 via INTRAVENOUS

## 2015-10-17 MED ORDER — SODIUM CHLORIDE 0.9% FLUSH
10.0000 mL | INTRAVENOUS | Status: DC | PRN
Start: 2015-10-17 — End: 2015-10-17
  Administered 2015-10-17: 10 mL
  Filled 2015-10-17: qty 10

## 2015-10-17 MED ORDER — TRASTUZUMAB CHEMO 150 MG IV SOLR
6.0000 mg/kg | Freq: Once | INTRAVENOUS | Status: AC
  Administered 2015-10-17: 420 mg via INTRAVENOUS
  Filled 2015-10-17: qty 20

## 2015-10-17 MED ORDER — SODIUM CHLORIDE 0.9 % IV SOLN
420.0000 mg | Freq: Once | INTRAVENOUS | Status: AC
  Administered 2015-10-17: 420 mg via INTRAVENOUS
  Filled 2015-10-17: qty 14

## 2015-10-17 MED ORDER — HEPARIN SOD (PORK) LOCK FLUSH 100 UNIT/ML IV SOLN
500.0000 [IU] | Freq: Once | INTRAVENOUS | Status: AC | PRN
  Administered 2015-10-17: 500 [IU]
  Filled 2015-10-17: qty 5

## 2015-10-17 MED ORDER — DIPHENHYDRAMINE HCL 25 MG PO CAPS
50.0000 mg | ORAL_CAPSULE | Freq: Once | ORAL | Status: AC
  Administered 2015-10-17: 50 mg via ORAL

## 2015-10-17 MED ORDER — DIPHENHYDRAMINE HCL 25 MG PO CAPS
ORAL_CAPSULE | ORAL | Status: AC
  Filled 2015-10-17: qty 2

## 2015-10-17 MED ORDER — ACETAMINOPHEN 325 MG PO TABS
ORAL_TABLET | ORAL | Status: AC
Start: 2015-10-17 — End: 2015-10-17
  Filled 2015-10-17: qty 2

## 2015-10-17 NOTE — Progress Notes (Signed)
Patient Care Team: Terald Sleeper, PA as PCP - General ([place]) Sylvan Cheese, NP as Nurse Practitioner (Hematology and Oncology)  DIAGNOSIS: Breast cancer of upper-inner quadrant of left female breast [facility])   Staging form: Breast, AJCC 7th Edition   - Clinical stage from 06/08/2015: Stage IA (T1c, N0, M0) - Unsigned         Staging comments: Staged at breast conference on 5.31.17  SUMMARY OF ONCOLOGIC HISTORY:   Breast cancer of upper-inner quadrant of left female breast ([place])   05/26/2015 Initial Diagnosis    Screen detected left breast asymmetry (posterior 1.6 cm): Grade 2-3 IDC ER/PR positive HER-2 positive Ki-67 20% plus calcs (span 6.1 cm): High-grade DCIS with suspicious foci of invasion (4.2 cm apart)      06/24/2015 Surgery    Left simple mastectomy with reconstruction: IDC grade 3, 2.5 cm, with high-grade DCIS, ALH, LVI present, margins negative, 0/1 sentinel node negative, T2 N0 stage II a, ER 100%, PR 5%, HER-2 positive ratio 1.56 with average copy #8.25, Ki-67 20%      07/25/2015 -  Chemotherapy    Adjuvant chemotherapy with TCH Perjeta 6 cycles followed by Herceptin maintenance for 1 year       CHIEF COMPLIANT: cycle 5 TCH Perjeta  INTERVAL HISTORY: RENNIE HACK is a 60 year old with above-mentioned history of left breast cancer currently on adjuvant chemotherapy and today's cycle 6 of [place]. She continues to have diarrhea related to Perjeta. She goes 4-5 times every day and this has been limiting her activities. She does not have much nausea but her appetite is good. Denies any fevers or chills. Denies any abdominal pain. She is using Lomotil and Imodium for diarrhea but in spite of that she continues to have diarrhea constantly. We gave her short-acting octreotide and this has helped her with the last treatment.  REVIEW OF SYSTEMS:   Constitutional: Denies fevers, chills or abnormal weight loss Eyes: Denies blurriness of vision Ears, nose,  mouth, throat, and face: Denies mucositis or sore throat Respiratory: Denies cough, dyspnea or wheezes Cardiovascular: Denies palpitation, chest discomfort Gastrointestinal: diarrhea Skin: Denies abnormal skin rashes Lymphatics: Denies new lymphadenopathy or easy bruising Neurological:Denies numbness, tingling or new weaknesses Behavioral/Psych: Mood is stable, no new changes  Extremities: No lower extremity edema  All other systems were reviewed with the patient and are negative.  I have reviewed the past medical history, past surgical history, social history and family history with the patient and they are unchanged from previous note.  ALLERGIES:  is allergic to codeine.  MEDICATIONS:  Current Outpatient Prescriptions  Medication Sig Dispense Refill  . ALPRAZolam (XANAX) 0.25 MG tablet Take 1 tablet (0.25 mg total) by mouth 3 (three) times daily as needed. 90 tablet 2  . ARIPiprazole (ABILIFY) 2 MG tablet Take 2 mg by mouth daily.    . Ascorbic Acid (VITAMIN C) 1000 MG tablet Take 1,000 mg by mouth daily.    . Biotin (BIOTIN 5000) 5 MG CAPS Take 5,000 mg by mouth daily.    . citalopram (CELEXA) 20 MG tablet Take 20 mg by mouth every morning.    . cyclobenzaprine (FLEXERIL) 10 MG tablet prn  1  . dexamethasone (DECADRON) 4 MG tablet TAKE 1 TABLET BY MOUTH EVERY DAY START THE DAY BEFORE TAXOTERE,THEN AGAIN THE DAY AFTER CHEMO.  1  . diphenoxylate-atropine (LOMOTIL) 2.5-0.025 MG tablet Take 1 tablet by mouth 4 (four) times daily as needed for diarrhea or loose stools. [place]  tablet 2  . esomeprazole (NEXIUM) 20 MG capsule Take 20 mg by mouth 2 (two) times daily before a meal.     . ferrous sulfate 325 (65 FE) MG EC tablet Take 325 mg by mouth 3 (three) times daily with meals.    . fluconazole (DIFLUCAN) 100 MG tablet Take 1 tablet (100 mg total) by mouth daily. 14 tablet 0  . hydrochlorothiazide (HYDRODIURIL) 12.5 MG tablet Take 1 tablet (12.5 mg total) by mouth daily. 90 tablet 3  .  HYDROcodone-acetaminophen (NORCO/VICODIN) 5-325 MG tablet TAKE 1 TABLET BY MOUTH EVERY 6 HOURS AS NEEDED FOR MODERATE TO SEVERE PAIN  0  . lidocaine-prilocaine (EMLA) cream Apply to affected area once 30 g 3  . lisinopril (PRINIVIL,ZESTRIL) 5 MG tablet Take 1 tablet (5 mg total) by mouth daily. 90 tablet 3  . LORazepam (ATIVAN) 0.5 MG tablet Take 1 tablet (0.5 mg total) by mouth every 6 (six) hours as needed (Nausea or vomiting). 30 tablet 0  . MELOXICAM PO Take 7.5 mg by mouth.     . Multiple Vitamin (MULTIVITAMIN) tablet Take 1 tablet by mouth daily.    . Omega-3 Fatty Acids (FISH OIL) 1200 MG CAPS Take 1,200 mg by mouth daily.    . ondansetron (ZOFRAN) 8 MG tablet Take 1 tablet (8 mg total) by mouth 2 (two) times daily as needed for refractory nausea / vomiting. Start on day 3 after chemo. 30 tablet 1  . potassium chloride SA (K-DUR,KLOR-CON) 20 MEQ tablet Take 1 tablet (20 mEq total) by mouth 2 (two) times daily. 60 tablet 2  . prochlorperazine (COMPAZINE) 10 MG tablet Take 1 tablet (10 mg total) by mouth every 6 (six) hours as needed (Nausea or vomiting). 30 tablet 1  . raloxifene (EVISTA) 60 MG tablet Take 1 tablet (60 mg total) by mouth daily. 30 tablet 11  . valACYclovir (VALTREX) 1000 MG tablet Take 1 tablet (1,000 mg total) by mouth 2 (two) times daily. 20 tablet 6   No current facility-administered medications for this visit.     PHYSICAL EXAMINATION: ECOG PERFORMANCE STATUS: 1 - Symptomatic but completely ambulatory  Vitals:   10/17/15 0915  BP: (!) 144/63  Pulse: (!) 104  Resp: 18  Temp: 98.3 F (36.8 C)   Filed Weights   10/17/15 0915  Weight: 140 lb 4.8 oz (63.6 kg)    GENERAL:alert, no distress and comfortable SKIN: skin color, texture, turgor are normal, no rashes or significant lesions EYES: normal, Conjunctiva are pink and non-injected, sclera clear OROPHARYNX:no exudate, no erythema and lips, buccal mucosa, and tongue normal  NECK: supple, thyroid normal size,  non-tender, without nodularity LYMPH:  no palpable lymphadenopathy in the cervical, axillary or inguinal LUNGS: clear to auscultation and percussion with normal breathing effort HEART: regular rate & rhythm and no murmurs and no lower extremity edema ABDOMEN:abdomen soft, non-tender and normal bowel sounds MUSCULOSKELETAL:no cyanosis of digits and no clubbing  NEURO: alert & oriented x 3 with fluent speech, no focal motor/sensory deficits EXTREMITIES: No lower extremity edema   LABORATORY DATA:  I have reviewed the data as listed   Chemistry      Component Value Date/Time   NA 143 09/26/2015 0831   K 3.3 (L) 09/26/2015 0831   CO2 24 09/26/2015 0831   BUN 12.5 09/26/2015 0831   CREATININE 0.7 09/26/2015 0831      Component Value Date/Time   CALCIUM 8.7 09/26/2015 0831   ALKPHOS 77 09/26/2015 0831   AST 23 09/26/2015 0831  ALT 24 09/26/2015 0831   BILITOT 0.30 09/26/2015 0831       Lab Results  Component Value Date   WBC 3.2 (L) 10/17/2015   HGB 7.7 (L) 10/17/2015   HCT 23.7 (L) 10/17/2015   MCV 92.9 10/17/2015   PLT 248 10/17/2015   NEUTROABS 1.8 10/17/2015     ASSESSMENT & PLAN:  Breast cancer of upper-inner quadrant of left female breast ([place]) Left simple mastectomy with reconstruction 06/24/2015: IDC grade 3, 2.5 cm, with high-grade DCIS, ALH, LVI present, margins negative, 0/1 sentinel node negative, , ER 100%, PR 5%, HER-2 positive ratio 1.56 with average copy #8.25, Ki-67 20%  Pathologic stage:T2 N0 stage II a Treatment plan: 1. Adjuvant chemotherapy with TCHP followed by Herceptin maintenance for 1 year 2. Followed by adjuvant hormonal therapy ---------------------------------------------------------------------------------------------------------- Current treatment: Cycle 5day 1TCH Perjeta Echocardiogram 07/27/2015 60-65%  Chemotherapy toxicities: 1. Nausea grade 1 2. Thrush: resolved withDiflucan 3. Diarrhea grade 2: Currently on Lomotil  andImodium daily. She responded very well to octreotide short-acting injection. I will order a Sandostatin LAR injection. If her diarrhea was controlled than it would make her life significantly better. She is currently limited to her house primarily because of diarrhea. 4. Dry cough: Probably related to fungal infection: Diflucan was used. 5. Severe fatigue 6. Hypokalemia: on potassium obtaining foods. 7. Alopecia 8. Profound anemia:hemoglobin 7.7.we decreased the dosage of chemotherapy from cycle 3 and once again with cycle 4. Patient takes oral iron every day.  We are monitoringher very closely for toxicities. Return to clinic in 3weeksfor cycle 6 after that she will get Herceptin maintenance every 3 weeks. We will also be starting her on oral antiestrogen therapy approximately a month after completion of treatment with chemotherapy. After one year of Herceptin she could be a candidate for Neratinib Patient is planning on undergoing breast reconstruction in December 2017.  No orders of the defined types were placed in this encounter.  The patient has a good understanding of the overall plan. she agrees with it. she will call with any problems that may develop before the next visit here.   Rulon Eisenmenger, MD 10/17/15

## 2015-10-17 NOTE — Assessment & Plan Note (Signed)
Left simple mastectomy with reconstruction 06/24/2015: IDC grade 3, 2.5 cm, with high-grade DCIS, ALH, LVI present, margins negative, 0/1 sentinel node negative, , ER 100%, PR 5%, HER-2 positive ratio 1.56 with average copy #8.25, Ki-67 20%  Pathologic stage:T2 N0 stage II a Treatment plan: 1. Adjuvant chemotherapy with TCHP followed by Herceptin maintenance for 1 year 2. Followed by adjuvant hormonal therapy ---------------------------------------------------------------------------------------------------------- Current treatment: Cycle 5day 1TCH Perjeta Echocardiogram 07/27/2015 60-65%  Chemotherapy toxicities: 1. Nausea grade 1 2. Thrush: resolved withDiflucan 3. Diarrhea grade 2: Currently on Lomotil andImodium daily. We will give her a dose of octreotide short-acting subcutaneous injection 50 g today. If she responds to this, and on plan on administering octreotide LAR. 4. Dry cough: Probably related to fungal infection: Diflucan should help. 5. Severe fatigue 6. Hypokalemia: Patient will take Potassium-rich foods 7. Alopecia 8. Profound anemia:hemoglobin 7.9. I would decrease the dosage of chemotherapy from cycle 3 and once again with cycle 4. Patient takes oral iron every day.  We are monitoringher very closely for toxicities. Return to clinic in 3weeksfor cycle 6

## 2015-10-17 NOTE — Patient Instructions (Signed)
[facility] Discharge Instructions for Patients Receiving Chemotherapy  Today you received the following chemotherapy agents:  Herceptin, Perjeta, Taxotere, and Carboplatin.  To help prevent nausea and vomiting after your treatment, we encourage you to take your nausea medication as directed.   If you develop nausea and vomiting that is not controlled by your nausea medication, call the clinic.   BELOW ARE SYMPTOMS THAT SHOULD BE REPORTED IMMEDIATELY:  *FEVER GREATER THAN 100.5 F  *CHILLS WITH OR WITHOUT FEVER  NAUSEA AND VOMITING THAT IS NOT CONTROLLED WITH YOUR NAUSEA MEDICATION  *UNUSUAL SHORTNESS OF BREATH  *UNUSUAL BRUISING OR BLEEDING  TENDERNESS IN MOUTH AND THROAT WITH OR WITHOUT PRESENCE OF ULCERS  *URINARY PROBLEMS  *BOWEL PROBLEMS  UNUSUAL RASH Items with * indicate a potential emergency and should be followed up as soon as possible.  Feel free to call the clinic you have any questions or concerns. The clinic phone number is [phone].  Please show the CHEMO ALERT CARD at check-in to the Emergency Department and triage nurse.   

## 2015-10-19 ENCOUNTER — Ambulatory Visit (HOSPITAL_BASED_OUTPATIENT_CLINIC_OR_DEPARTMENT_OTHER): Payer: BLUE CROSS/BLUE SHIELD

## 2015-10-19 VITALS — BP 127/59 | HR 102 | Temp 98.1°F | Resp 20

## 2015-10-19 DIAGNOSIS — K521 Toxic gastroenteritis and colitis: Secondary | ICD-10-CM

## 2015-10-19 DIAGNOSIS — Z9012 Acquired absence of left breast and nipple: Secondary | ICD-10-CM | POA: Diagnosis not present

## 2015-10-19 DIAGNOSIS — T451X5A Adverse effect of antineoplastic and immunosuppressive drugs, initial encounter: Secondary | ICD-10-CM

## 2015-10-19 DIAGNOSIS — C50312 Malignant neoplasm of lower-inner quadrant of left female breast: Secondary | ICD-10-CM | POA: Diagnosis not present

## 2015-10-19 DIAGNOSIS — R197 Diarrhea, unspecified: Secondary | ICD-10-CM | POA: Diagnosis not present

## 2015-10-19 MED ORDER — OCTREOTIDE ACETATE 30 MG IM KIT
30.0000 mg | PACK | Freq: Once | INTRAMUSCULAR | Status: AC
  Administered 2015-10-19: 30 mg via INTRAMUSCULAR
  Filled 2015-10-19: qty 1

## 2015-10-19 NOTE — Patient Instructions (Signed)

## 2015-10-22 DIAGNOSIS — Z9889 Other specified postprocedural states: Secondary | ICD-10-CM | POA: Diagnosis not present

## 2015-10-22 DIAGNOSIS — Z9221 Personal history of antineoplastic chemotherapy: Secondary | ICD-10-CM | POA: Diagnosis not present

## 2015-10-22 DIAGNOSIS — I499 Cardiac arrhythmia, unspecified: Secondary | ICD-10-CM | POA: Diagnosis not present

## 2015-10-22 DIAGNOSIS — I10 Essential (primary) hypertension: Secondary | ICD-10-CM | POA: Diagnosis not present

## 2015-10-22 DIAGNOSIS — F410 Panic disorder [episodic paroxysmal anxiety] without agoraphobia: Secondary | ICD-10-CM | POA: Diagnosis not present

## 2015-10-22 DIAGNOSIS — R0789 Other chest pain: Secondary | ICD-10-CM | POA: Diagnosis not present

## 2015-10-22 DIAGNOSIS — I209 Angina pectoris, unspecified: Secondary | ICD-10-CM | POA: Diagnosis not present

## 2015-10-22 DIAGNOSIS — F29 Unspecified psychosis not due to a substance or known physiological condition: Secondary | ICD-10-CM | POA: Diagnosis not present

## 2015-10-22 DIAGNOSIS — Z853 Personal history of malignant neoplasm of breast: Secondary | ICD-10-CM | POA: Diagnosis not present

## 2015-10-22 DIAGNOSIS — Z6825 Body mass index (BMI) 25.0-25.9, adult: Secondary | ICD-10-CM | POA: Diagnosis not present

## 2015-10-22 DIAGNOSIS — Z79899 Other long term (current) drug therapy: Secondary | ICD-10-CM | POA: Diagnosis not present

## 2015-10-22 DIAGNOSIS — D649 Anemia, unspecified: Secondary | ICD-10-CM | POA: Diagnosis not present

## 2015-10-22 DIAGNOSIS — E876 Hypokalemia: Secondary | ICD-10-CM | POA: Insufficient documentation

## 2015-10-22 DIAGNOSIS — F419 Anxiety disorder, unspecified: Secondary | ICD-10-CM | POA: Diagnosis not present

## 2015-10-22 DIAGNOSIS — I200 Unstable angina: Secondary | ICD-10-CM | POA: Diagnosis not present

## 2015-10-22 DIAGNOSIS — R0602 Shortness of breath: Secondary | ICD-10-CM | POA: Diagnosis not present

## 2015-10-22 DIAGNOSIS — I4581 Long QT syndrome: Secondary | ICD-10-CM | POA: Diagnosis not present

## 2015-10-22 DIAGNOSIS — Z9012 Acquired absence of left breast and nipple: Secondary | ICD-10-CM | POA: Diagnosis not present

## 2015-10-22 DIAGNOSIS — Z87891 Personal history of nicotine dependence: Secondary | ICD-10-CM | POA: Diagnosis not present

## 2015-10-22 DIAGNOSIS — R079 Chest pain, unspecified: Secondary | ICD-10-CM | POA: Diagnosis not present

## 2015-10-23 DIAGNOSIS — I10 Essential (primary) hypertension: Secondary | ICD-10-CM | POA: Diagnosis not present

## 2015-10-23 DIAGNOSIS — I259 Chronic ischemic heart disease, unspecified: Secondary | ICD-10-CM | POA: Diagnosis not present

## 2015-10-23 DIAGNOSIS — F410 Panic disorder [episodic paroxysmal anxiety] without agoraphobia: Secondary | ICD-10-CM | POA: Insufficient documentation

## 2015-10-23 DIAGNOSIS — Z9012 Acquired absence of left breast and nipple: Secondary | ICD-10-CM | POA: Diagnosis not present

## 2015-10-23 DIAGNOSIS — Z853 Personal history of malignant neoplasm of breast: Secondary | ICD-10-CM | POA: Diagnosis not present

## 2015-10-23 DIAGNOSIS — D649 Anemia, unspecified: Secondary | ICD-10-CM | POA: Diagnosis not present

## 2015-10-23 DIAGNOSIS — R079 Chest pain, unspecified: Secondary | ICD-10-CM | POA: Diagnosis not present

## 2015-10-23 DIAGNOSIS — Z9889 Other specified postprocedural states: Secondary | ICD-10-CM | POA: Diagnosis not present

## 2015-10-23 DIAGNOSIS — R0602 Shortness of breath: Secondary | ICD-10-CM | POA: Diagnosis not present

## 2015-10-23 DIAGNOSIS — Z87891 Personal history of nicotine dependence: Secondary | ICD-10-CM | POA: Diagnosis not present

## 2015-10-23 DIAGNOSIS — Z79899 Other long term (current) drug therapy: Secondary | ICD-10-CM | POA: Diagnosis not present

## 2015-10-23 DIAGNOSIS — E876 Hypokalemia: Secondary | ICD-10-CM | POA: Diagnosis not present

## 2015-10-24 ENCOUNTER — Telehealth: Payer: Self-pay

## 2015-10-24 NOTE — Telephone Encounter (Signed)
Received [facility] fax with report from call made on 10/22/15.  Pt's sister-in-law called after hours and spoke to [facility] RN reporting that pt was experiencing sudden onset of tingling, numbness, white hands, buzzing in head, chest tightness, and erratic breathing.  [facility] RN recommend pt seek EMS immediately for evaluation of symptoms.  Upon chart review, nothing noted indicating pt sought ED evaluation.  Called pt to follow up.  Pt reports her family called EMS and pt was transported to [facility] and evaluated for these symptoms.  Pt reports episode as her "whole body seizing up" which led her to go into sudden panic attack.  Pt reports her electrolytes were replaced and she was admitted over night for observation and discharged on Sunday.  Pt questioning if she needed to be seen by our office today.  Pt reports she is asymptomatic today.  Denies any of the same symptoms from this weekend and reports no trouble breathing.  Reviewed all information with Dr. Lindi Adie who wishes for [place] to obtain records from [facility] for his review.  Also informed pt we do not need to see her here as long as she remains asymptomatic and does not feel she needs further evaluation.  Pt denies this need at this time and reports she will let us know if she experiences any further issues or thinks she needs to come in sooner than next scheduled appt.  Pt verbalized understanding of all information discussed and is without further questions or concerns at time of call.

## 2015-10-25 ENCOUNTER — Ambulatory Visit (HOSPITAL_COMMUNITY)
Admission: RE | Admit: 2015-10-25 | Discharge: 2015-10-25 | Disposition: A | Discharge status: Home / Self Care | Payer: BLUE CROSS/BLUE SHIELD | Source: Ambulatory Visit | Attending: Cardiology | Admitting: Cardiology

## 2015-10-25 ENCOUNTER — Encounter (HOSPITAL_COMMUNITY): Payer: Self-pay

## 2015-10-25 VITALS — BP 146/72 | HR 103 | Wt 138.5 lb

## 2015-10-25 DIAGNOSIS — C50212 Malignant neoplasm of upper-inner quadrant of left female breast: Secondary | ICD-10-CM

## 2015-10-25 DIAGNOSIS — I208 Other forms of angina pectoris: Secondary | ICD-10-CM

## 2015-10-25 DIAGNOSIS — R079 Chest pain, unspecified: Secondary | ICD-10-CM | POA: Insufficient documentation

## 2015-10-25 DIAGNOSIS — Z79899 Other long term (current) drug therapy: Secondary | ICD-10-CM | POA: Diagnosis not present

## 2015-10-25 DIAGNOSIS — Z87891 Personal history of nicotine dependence: Secondary | ICD-10-CM | POA: Diagnosis not present

## 2015-10-25 DIAGNOSIS — I10 Essential (primary) hypertension: Secondary | ICD-10-CM | POA: Diagnosis not present

## 2015-10-25 DIAGNOSIS — K219 Gastro-esophageal reflux disease without esophagitis: Secondary | ICD-10-CM | POA: Insufficient documentation

## 2015-10-25 DIAGNOSIS — R0789 Other chest pain: Secondary | ICD-10-CM

## 2015-10-25 NOTE — Progress Notes (Signed)
Patient ID: Heidi Walls, female   DOB: 1955-06-01, 60 y.o.   MRN: [id number] Oncologist: Dr. Lindi Adie  61 yo with history of HTN, GERD, and breast cancer presents for cardio-oncology clinic evaluation.  She had left mastectomy in 6/17.  ER+/PR+/HER2+ tumor.  She will have TCHP adjuvant chemo followed by Herceptin alone for total of 1 year Herceptin-based therapy, completing in 6/18.     She has no history of cardiac problems.  She is not a smoker.    She was admitted to [facility] 10/14-15 with muscle spasms and chest pain/heaviness as well as dyspnea.  Symptoms resolved on there own. ECG and cardiac enzymes unremarkable.  CTA chest showed no PE.  She was thought to have possibly had a panic attack as she had not been taking her Xanax.  She has had no chest pain or dyspnea since discharge.  It was recommended that she have a stress test.   PMH: 1. HTN 2. GERD 3. Breast cancer: Left mastectomy in 6/17.  ER+/PR+/HER2+ tumor.  She will have TCHP adjuvant chemo followed by Herceptin alone for total of 1 year Herceptin-based therapy.   - Echo (7/17): EF 60-65%, lateral s' 12.4, GLS -23.7%, grade II diastolic dysfunction.  - Echo (10/17): EF 60-65%, lateral s' 10.1, GLS -23.4%, normal RV size and systolic function.   Social History   Social History  . Marital status: Married    Spouse name: N/A  . Number of children: N/A  . Years of education: N/A   Occupational History  . Not on file.   Social History Main Topics  . Smoking status: Former Research scientist (life sciences)  . Smokeless tobacco: Never Used  . Alcohol use Yes     Comment: social  . Drug use: No  . Sexual activity: Not on file   Other Topics Concern  . Not on file   Social History Narrative  . No narrative on file   Family History  Problem Relation Age of Onset  . Heart failure Mother   . Diabetes Mother   . Hypertension Mother   . Hypertension Father   . Breast cancer Maternal Grandmother    ROS: All systems reviewed and  negative except as per HPI.   Current Outpatient Prescriptions  Medication Sig Dispense Refill  . ALPRAZolam (XANAX) 0.25 MG tablet Take 1 tablet (0.25 mg total) by mouth 3 (three) times daily as needed. 90 tablet 2  . Ascorbic Acid (VITAMIN C) 1000 MG tablet Take 1,000 mg by mouth daily.    . Biotin (BIOTIN 5000) 5 MG CAPS Take 5,000 mg by mouth daily.    . calcium carbonate (TUMS - DOSED IN MG ELEMENTAL CALCIUM) 500 MG chewable tablet Chew 3 tablets by mouth 2 (two) times daily.    . citalopram (CELEXA) 20 MG tablet Take 20 mg by mouth every morning.    . cyclobenzaprine (FLEXERIL) 10 MG tablet prn  1  . esomeprazole (NEXIUM) 20 MG capsule Take 20 mg by mouth 2 (two) times daily before a meal.     . ferrous sulfate 325 (65 FE) MG EC tablet Take 325 mg by mouth 3 (three) times daily with meals.    . hydrochlorothiazide (HYDRODIURIL) 12.5 MG tablet Take 1 tablet (12.5 mg total) by mouth daily. 90 tablet 3  . lidocaine-prilocaine (EMLA) cream Apply to affected area once 30 g 3  . MELOXICAM PO Take 7.5 mg by mouth.     . Multiple Vitamin (MULTIVITAMIN) tablet Take 1 tablet by  mouth daily.    . Omega-3 Fatty Acids (FISH OIL) 1200 MG CAPS Take 1,200 mg by mouth daily.    . potassium chloride SA (K-DUR,KLOR-CON) 20 MEQ tablet Take 1 tablet (20 mEq total) by mouth 2 (two) times daily. 60 tablet 2  . raloxifene (EVISTA) 60 MG tablet Take 1 tablet (60 mg total) by mouth daily. 30 tablet 11  . diphenoxylate-atropine (LOMOTIL) 2.5-0.025 MG tablet Take 1 tablet by mouth 4 (four) times daily as needed for diarrhea or loose stools. (Patient not taking: Reported on 10/25/2015) 30 tablet 2  . HYDROcodone-acetaminophen (NORCO/VICODIN) 5-325 MG tablet TAKE 1 TABLET BY MOUTH EVERY 6 HOURS AS NEEDED FOR MODERATE TO SEVERE PAIN  0  . LORazepam (ATIVAN) 0.5 MG tablet Take 1 tablet (0.5 mg total) by mouth every 6 (six) hours as needed (Nausea or vomiting). (Patient not taking: Reported on 10/25/2015) 30 tablet 0  .  ondansetron (ZOFRAN) 8 MG tablet Take 1 tablet (8 mg total) by mouth 2 (two) times daily as needed for refractory nausea / vomiting. Start on day 3 after chemo. (Patient not taking: Reported on 10/25/2015) 30 tablet 1  . prochlorperazine (COMPAZINE) 10 MG tablet Take 1 tablet (10 mg total) by mouth every 6 (six) hours as needed (Nausea or vomiting). (Patient not taking: Reported on 10/25/2015) 30 tablet 1  . valACYclovir (VALTREX) 1000 MG tablet Take 1 tablet (1,000 mg total) by mouth 2 (two) times daily. (Patient not taking: Reported on 10/25/2015) 20 tablet 6   No current facility-administered medications for this encounter.    BP (!) 146/72   Pulse (!) 103   Wt 138 lb 8 oz (62.8 kg)   SpO2 100%   BMI 25.33 kg/m  General: NAD Neck: No JVD, no thyromegaly or thyroid nodule.  Lungs: Clear to auscultation bilaterally with normal respiratory effort. CV: Nondisplaced PMI.  Heart regular S1/S2, no S3/S4, no murmur.  No peripheral edema.  No carotid bruit.  Normal pedal pulses.  Abdomen: Soft, nontender, no hepatosplenomegaly, no distention.  Skin: Intact without lesions or rashes.  Neurologic: Alert and oriented x 3.  Psych: Normal affect. Extremities: No clubbing or cyanosis.  HEENT: Normal.   Assessment/Plan: 1. Breast cancer:  Echo from today was reviewed, EF is normal with normal strain pattern.   - She will return for echo and office visit in 3 months.  2. Chest pain: Patient was admitted to [facility] over the weekend with chest pain, possibly due to panic attack. No chest pain since she left the hospital. - Given severity of the initial symptoms, will arrange for ETT-Cardiolite.    Loralie Champagne 10/25/2015

## 2015-10-25 NOTE — Progress Notes (Signed)
  Echocardiogram 2D Echocardiogram has been performed.  Jennette Dubin 10/25/2015, 10:01 AM

## 2015-10-25 NOTE — Patient Instructions (Signed)
Your physician has requested that you have en exercise stress myoview. For further information please visit [email or web address]. Please follow instruction sheet, as given.  We will contact you in 3 months to schedule your next appointment and echocardiogram

## 2015-10-31 ENCOUNTER — Other Ambulatory Visit: Payer: Self-pay | Admitting: Hematology and Oncology

## 2015-10-31 DIAGNOSIS — C50212 Malignant neoplasm of upper-inner quadrant of left female breast: Secondary | ICD-10-CM

## 2015-10-31 NOTE — Telephone Encounter (Signed)
Chart reviewed.

## 2015-11-01 ENCOUNTER — Telehealth (HOSPITAL_COMMUNITY): Payer: Self-pay | Admitting: *Deleted

## 2015-11-01 NOTE — Telephone Encounter (Signed)
Patient given detailed instructions per Myocardial Perfusion Study Information Sheet for the test on 11/01/15. Patient notified to arrive 15 minutes early and that it is imperative to arrive on time for appointment to keep from having the test rescheduled.  If you need to cancel or reschedule your appointment, please call the office within 24 hours of your appointment. Failure to do so may result in a cancellation of your appointment, and a $50 no show fee. Patient verbalized understanding.  Hubbard Robinson, RN

## 2015-11-04 ENCOUNTER — Ambulatory Visit (HOSPITAL_COMMUNITY): Payer: BLUE CROSS/BLUE SHIELD | Attending: Cardiovascular Disease

## 2015-11-04 DIAGNOSIS — R0789 Other chest pain: Secondary | ICD-10-CM | POA: Diagnosis not present

## 2015-11-04 LAB — MYOCARDIAL PERFUSION IMAGING
CHL CUP NUCLEAR SDS: 8
CHL CUP NUCLEAR SRS: 12
CHL CUP NUCLEAR SSS: 19
CSEPEW: 4.6 METS
CSEPHR: 93 %
CSEPPHR: 150 {beats}/min
Exercise duration (min): 6 min
Exercise duration (sec): 0 s
LV dias vol: 67 mL (ref 46–106)
LVSYSVOL: 26 mL
MPHR: 161 {beats}/min
RATE: 0.38
Rest HR: 93 {beats}/min
TID: 1.09

## 2015-11-04 MED ORDER — TECHNETIUM TC 99M TETROFOSMIN IV KIT
10.2000 | PACK | Freq: Once | INTRAVENOUS | Status: AC | PRN
  Administered 2015-11-04: 10.2 via INTRAVENOUS
  Filled 2015-11-04: qty 11

## 2015-11-04 MED ORDER — TECHNETIUM TC 99M TETROFOSMIN IV KIT
32.4000 | PACK | Freq: Once | INTRAVENOUS | Status: AC | PRN
  Administered 2015-11-04: 32.4 via INTRAVENOUS
  Filled 2015-11-04: qty 33

## 2015-11-07 ENCOUNTER — Ambulatory Visit (HOSPITAL_BASED_OUTPATIENT_CLINIC_OR_DEPARTMENT_OTHER): Payer: BLUE CROSS/BLUE SHIELD | Admitting: Hematology and Oncology

## 2015-11-07 ENCOUNTER — Encounter: Payer: Self-pay | Admitting: *Deleted

## 2015-11-07 ENCOUNTER — Encounter: Payer: Self-pay | Admitting: Hematology and Oncology

## 2015-11-07 ENCOUNTER — Ambulatory Visit (HOSPITAL_BASED_OUTPATIENT_CLINIC_OR_DEPARTMENT_OTHER): Payer: BLUE CROSS/BLUE SHIELD

## 2015-11-07 ENCOUNTER — Other Ambulatory Visit (HOSPITAL_BASED_OUTPATIENT_CLINIC_OR_DEPARTMENT_OTHER): Payer: BLUE CROSS/BLUE SHIELD

## 2015-11-07 VITALS — HR 93

## 2015-11-07 DIAGNOSIS — Z170 Estrogen receptor positive status [ER+]: Secondary | ICD-10-CM

## 2015-11-07 DIAGNOSIS — R197 Diarrhea, unspecified: Secondary | ICD-10-CM | POA: Diagnosis not present

## 2015-11-07 DIAGNOSIS — D6481 Anemia due to antineoplastic chemotherapy: Secondary | ICD-10-CM | POA: Diagnosis not present

## 2015-11-07 DIAGNOSIS — C50212 Malignant neoplasm of upper-inner quadrant of left female breast: Secondary | ICD-10-CM

## 2015-11-07 DIAGNOSIS — Z5111 Encounter for antineoplastic chemotherapy: Secondary | ICD-10-CM | POA: Diagnosis not present

## 2015-11-07 DIAGNOSIS — R05 Cough: Secondary | ICD-10-CM

## 2015-11-07 DIAGNOSIS — Z5112 Encounter for antineoplastic immunotherapy: Secondary | ICD-10-CM | POA: Diagnosis not present

## 2015-11-07 DIAGNOSIS — R110 Nausea: Secondary | ICD-10-CM

## 2015-11-07 DIAGNOSIS — R530 Neoplastic (malignant) related fatigue: Secondary | ICD-10-CM

## 2015-11-07 DIAGNOSIS — E876 Hypokalemia: Secondary | ICD-10-CM

## 2015-11-07 DIAGNOSIS — L658 Other specified nonscarring hair loss: Secondary | ICD-10-CM

## 2015-11-07 LAB — CBC WITH DIFFERENTIAL/PLATELET
BASO%: 0.7 % (ref 0.0–2.0)
BASOS ABS: 0 10*3/uL (ref 0.0–0.1)
EOS ABS: 0 10*3/uL (ref 0.0–0.5)
EOS%: 0 % (ref 0.0–7.0)
HCT: 23.8 % — ABNORMAL LOW (ref 34.8–46.6)
HEMOGLOBIN: 7.6 g/dL — AB (ref 11.6–15.9)
LYMPH%: 24 % (ref 14.0–49.7)
MCH: 31.1 pg (ref 25.1–34.0)
MCHC: 31.9 g/dL (ref 31.5–36.0)
MCV: 97.5 fL (ref 79.5–101.0)
MONO#: 0.5 10*3/uL (ref 0.1–0.9)
MONO%: 11.6 % (ref 0.0–14.0)
NEUT#: 2.7 10*3/uL (ref 1.5–6.5)
NEUT%: 63.7 % (ref 38.4–76.8)
PLATELETS: 161 10*3/uL (ref 145–400)
RBC: 2.44 10*6/uL — AB (ref 3.70–5.45)
RDW: 18.9 % — ABNORMAL HIGH (ref 11.2–14.5)
WBC: 4.3 10*3/uL (ref 3.9–10.3)
lymph#: 1 10*3/uL (ref 0.9–3.3)

## 2015-11-07 LAB — COMPREHENSIVE METABOLIC PANEL
ALBUMIN: 3.6 g/dL (ref 3.5–5.0)
ALK PHOS: 70 U/L (ref 40–150)
ALT: 16 U/L (ref 0–55)
ANION GAP: 10 meq/L (ref 3–11)
AST: 18 U/L (ref 5–34)
BILIRUBIN TOTAL: 0.27 mg/dL (ref 0.20–1.20)
BUN: 15 mg/dL (ref 7.0–26.0)
CO2: 26 mEq/L (ref 22–29)
Calcium: 8.1 mg/dL — ABNORMAL LOW (ref 8.4–10.4)
Chloride: 105 mEq/L (ref 98–109)
Creatinine: 0.9 mg/dL (ref 0.6–1.1)
EGFR: 71 mL/min/{1.73_m2} — AB (ref >90)
GLUCOSE: 83 mg/dL (ref 70–140)
POTASSIUM: 3.5 meq/L (ref 3.5–5.1)
SODIUM: 141 meq/L (ref 136–145)
TOTAL PROTEIN: 6.7 g/dL (ref 6.4–8.3)

## 2015-11-07 MED ORDER — ACETAMINOPHEN 325 MG PO TABS
650.0000 mg | ORAL_TABLET | Freq: Once | ORAL | Status: AC
Start: 2015-11-07 — End: 2015-11-07
  Administered 2015-11-07: 650 mg via ORAL

## 2015-11-07 MED ORDER — DOCETAXEL CHEMO INJECTION 160 MG/16ML
40.0000 mg/m2 | Freq: Once | INTRAVENOUS | Status: AC
  Administered 2015-11-07: 70 mg via INTRAVENOUS
  Filled 2015-11-07: qty 7

## 2015-11-07 MED ORDER — SODIUM CHLORIDE 0.9 % IV SOLN
390.8000 mg | Freq: Once | INTRAVENOUS | Status: AC
  Administered 2015-11-07: 390 mg via INTRAVENOUS
  Filled 2015-11-07: qty 39

## 2015-11-07 MED ORDER — DIPHENHYDRAMINE HCL 25 MG PO CAPS
ORAL_CAPSULE | ORAL | Status: AC
  Filled 2015-11-07: qty 2

## 2015-11-07 MED ORDER — TRASTUZUMAB CHEMO 150 MG IV SOLR
6.0000 mg/kg | Freq: Once | INTRAVENOUS | Status: AC
  Administered 2015-11-07: 378 mg via INTRAVENOUS
  Filled 2015-11-07: qty 18

## 2015-11-07 MED ORDER — SODIUM CHLORIDE 0.9 % IV SOLN
420.0000 mg | Freq: Once | INTRAVENOUS | Status: AC
  Administered 2015-11-07: 420 mg via INTRAVENOUS
  Filled 2015-11-07: qty 14

## 2015-11-07 MED ORDER — SODIUM CHLORIDE 0.9 % IV SOLN
Freq: Once | INTRAVENOUS | Status: AC
  Administered 2015-11-07: 10:00:00 via INTRAVENOUS
  Filled 2015-11-07: qty 5

## 2015-11-07 MED ORDER — DIPHENHYDRAMINE HCL 25 MG PO CAPS
50.0000 mg | ORAL_CAPSULE | Freq: Once | ORAL | Status: AC
  Administered 2015-11-07: 50 mg via ORAL

## 2015-11-07 MED ORDER — HEPARIN SOD (PORK) LOCK FLUSH 100 UNIT/ML IV SOLN
500.0000 [IU] | Freq: Once | INTRAVENOUS | Status: AC | PRN
  Administered 2015-11-07: 500 [IU]
  Filled 2015-11-07: qty 5

## 2015-11-07 MED ORDER — SODIUM CHLORIDE 0.9 % IV SOLN
Freq: Once | INTRAVENOUS | Status: AC
  Administered 2015-11-07: 09:00:00 via INTRAVENOUS

## 2015-11-07 MED ORDER — PALONOSETRON HCL INJECTION 0.25 MG/5ML
INTRAVENOUS | Status: AC
  Filled 2015-11-07: qty 5

## 2015-11-07 MED ORDER — SODIUM CHLORIDE 0.9% FLUSH
10.0000 mL | INTRAVENOUS | Status: DC | PRN
  Administered 2015-11-07: 10 mL
  Filled 2015-11-07: qty 10

## 2015-11-07 MED ORDER — OCTREOTIDE ACETATE 30 MG IM KIT
30.0000 mg | PACK | Freq: Once | INTRAMUSCULAR | Status: AC
  Administered 2015-11-07: 30 mg via INTRAMUSCULAR
  Filled 2015-11-07: qty 1

## 2015-11-07 MED ORDER — ACETAMINOPHEN 325 MG PO TABS
ORAL_TABLET | ORAL | Status: AC
  Filled 2015-11-07: qty 2

## 2015-11-07 MED ORDER — PALONOSETRON HCL INJECTION 0.25 MG/5ML
0.2500 mg | Freq: Once | INTRAVENOUS | Status: AC
  Administered 2015-11-07: 0.25 mg via INTRAVENOUS

## 2015-11-07 MED ORDER — PROCHLORPERAZINE MALEATE 10 MG PO TABS: 10.0000 mg | ORAL_TABLET | Freq: Four times a day (QID) | ORAL | 1 refills | Status: DC | PRN

## 2015-11-07 MED ORDER — PEGFILGRASTIM 6 MG/0.6ML ~~LOC~~ PSKT
6.0000 mg | PREFILLED_SYRINGE | Freq: Once | SUBCUTANEOUS | Status: AC
  Administered 2015-11-07: 6 mg via SUBCUTANEOUS
  Filled 2015-11-07: qty 0.6

## 2015-11-07 NOTE — Assessment & Plan Note (Signed)
Left simple mastectomy with reconstruction 06/24/2015: IDC grade 3, 2.5 cm, with high-grade DCIS, ALH, LVI present, margins negative, 0/1 sentinel node negative, , ER 100%, PR 5%, HER-2 positive ratio 1.56 with average copy #8.25, Ki-67 20%  Pathologic stage:T2 N0 stage II a Treatment plan: 1. Adjuvant chemotherapy with TCHP followed by Herceptin maintenance for 1 year 2. Followed by adjuvant hormonal therapy ---------------------------------------------------------------------------------------------------------- Current treatment: Cycle 6day [place] Perjeta Echocardiogram 07/27/2015 60-65%  Chemotherapy toxicities: 1. Nausea grade 1 2. Thrush: resolved withDiflucan 3. Diarrhea grade 2: Currently on Lomotil andImodium daily. She responded very well to octreotide short-acting injection. I will order a Sandostatin LAR injection. If her diarrhea was controlled than it would make her life significantly better. She is currently limited to her house primarily because of diarrhea. 4. Dry cough: Probably related to fungal infection: Diflucan was used. 5. Severe fatigue 6. Hypokalemia: on potassium obtaining foods. 7. Alopecia 8. Profound anemia:hemoglobin 7.7.we decreased the dosage of chemotherapy from cycle 3 and once againwith cycle 4. Patient takes oral iron every day.  We are monitoringher very closely for toxicities. Return to clinic in 3weeksfor Herceptin maintenance every 3 weeks. We will also be starting her on oral antiestrogen therapy approximately a month from now. After one year of Herceptin she could be a candidate for Neratinib Patient is planning on undergoing breast reconstruction in December 2017.  RTC in 6 weeks for follow up on Herceptin

## 2015-11-07 NOTE — Progress Notes (Signed)
Okay to treat today despite hgb 7.6 per Dr. Lindi Adie.

## 2015-11-07 NOTE — Progress Notes (Signed)
Patient Care Team: Terald Sleeper, PA-C as PCP - General ([place]) Sylvan Cheese, NP as Nurse Practitioner (Hematology and Oncology)  DIAGNOSIS:  Encounter Diagnosis  Name Primary?  . Malignant neoplasm of upper-inner quadrant of left breast in female, estrogen receptor positive ([place])     SUMMARY OF ONCOLOGIC HISTORY:   Breast cancer of upper-inner quadrant of left female breast ([place])   05/26/2015 Initial Diagnosis    Screen detected left breast asymmetry (posterior 1.6 cm): Grade 2-3 IDC ER/PR positive HER-2 positive Ki-67 20% plus calcs (span 6.1 cm): High-grade DCIS with suspicious foci of invasion (4.2 cm apart)      06/24/2015 Surgery    Left simple mastectomy with reconstruction: IDC grade 3, 2.5 cm, with high-grade DCIS, ALH, LVI present, margins negative, 0/1 sentinel node negative, T2 N0 stage II a, ER 100%, PR 5%, HER-2 positive ratio 1.56 with average copy #8.25, Ki-67 20%      07/25/2015 -  Chemotherapy    Adjuvant chemotherapy with TCH Perjeta 6 cycles followed by Herceptin maintenance for 1 year       CHIEF COMPLIANT: Cycle 6 TCH Perjeta  INTERVAL HISTORY: [name] is a 60 year old with above-mentioned history left breast cancer treated with mastectomy with reconstruction and is currently on adjuvant chemotherapy. Today is cycle 6 TCH Perjeta. Today is the last cycle of chemotherapy. Patient has continued to have trouble with diarrhea for which she had received Sandostatin LAR. Diarrhea improved for a week. After the last treatment, she had to be hospitalized for dehydration and hypokalemia.The diarrhea has limited her activities that she can do. She has been pretty much staying at home. Because of the diarrhea she had hypokalemia. She is also profoundly anemic as a result of chemotherapy. Today is the last cycle of chemotherapy and patient is very excited about that.  REVIEW OF SYSTEMS:   Constitutional: Denies fevers, chills or abnormal weight  loss Eyes: Denies blurriness of vision Ears, nose, mouth, throat, and face: Denies mucositis or sore throat Respiratory: Denies cough, dyspnea or wheezes Cardiovascular: Denies palpitation, chest discomfort Gastrointestinal:  Diarrhea Skin: Denies abnormal skin rashes Lymphatics: Denies new lymphadenopathy or easy bruising Neurological:Denies numbness, tingling or new weaknesses Behavioral/Psych: Mood is stable, no new changes  Extremities: No lower extremity edema All other systems were reviewed with the patient and are negative.  I have reviewed the past medical history, past surgical history, social history and family history with the patient and they are unchanged from previous note.  ALLERGIES:  is allergic to codeine.  MEDICATIONS:  Current Outpatient Prescriptions  Medication Sig Dispense Refill  . ALPRAZolam (XANAX) 0.25 MG tablet Take 1 tablet (0.25 mg total) by mouth 3 (three) times daily as needed. 90 tablet 2  . Ascorbic Acid (VITAMIN C) 1000 MG tablet Take 1,000 mg by mouth daily.    . Biotin (BIOTIN 5000) 5 MG CAPS Take 5,000 mg by mouth daily.    . calcium carbonate (TUMS - DOSED IN MG ELEMENTAL CALCIUM) 500 MG chewable tablet Chew 3 tablets by mouth 2 (two) times daily.    . citalopram (CELEXA) 20 MG tablet Take 20 mg by mouth every morning.    . cyclobenzaprine (FLEXERIL) 10 MG tablet prn  1  . diphenoxylate-atropine (LOMOTIL) 2.5-0.025 MG tablet TAKE 1 TABLET BY MOUTH 4 TIMES A DAY AS NEEDED FOR DIARRHEA 30 tablet 2  . esomeprazole (NEXIUM) 20 MG capsule Take 20 mg by mouth 2 (two) times daily before a meal.     .  ferrous sulfate 325 (65 FE) MG EC tablet Take 325 mg by mouth 3 (three) times daily with meals.    . hydrochlorothiazide (HYDRODIURIL) 12.5 MG tablet Take 1 tablet (12.5 mg total) by mouth daily. 90 tablet 3  . HYDROcodone-acetaminophen (NORCO/VICODIN) 5-325 MG tablet TAKE 1 TABLET BY MOUTH EVERY 6 HOURS AS NEEDED FOR MODERATE TO SEVERE PAIN  0  .  lidocaine-prilocaine (EMLA) cream Apply to affected area once 30 g 3  . LORazepam (ATIVAN) 0.5 MG tablet Take 1 tablet (0.5 mg total) by mouth every 6 (six) hours as needed (Nausea or vomiting). (Patient not taking: Reported on 10/25/2015) 30 tablet 0  . MELOXICAM PO Take 7.5 mg by mouth.     . Multiple Vitamin (MULTIVITAMIN) tablet Take 1 tablet by mouth daily.    . Omega-3 Fatty Acids (FISH OIL) 1200 MG CAPS Take 1,200 mg by mouth daily.    . ondansetron (ZOFRAN) 8 MG tablet Take 1 tablet (8 mg total) by mouth 2 (two) times daily as needed for refractory nausea / vomiting. Start on day 3 after chemo. (Patient not taking: Reported on 10/25/2015) 30 tablet 1  . potassium chloride SA (K-DUR,KLOR-CON) 20 MEQ tablet Take 1 tablet (20 mEq total) by mouth 2 (two) times daily. 60 tablet 2  . prochlorperazine (COMPAZINE) 10 MG tablet Take 1 tablet (10 mg total) by mouth every 6 (six) hours as needed (Nausea or vomiting). (Patient not taking: Reported on 10/25/2015) 30 tablet 1  . raloxifene (EVISTA) 60 MG tablet Take 1 tablet (60 mg total) by mouth daily. 30 tablet 11  . valACYclovir (VALTREX) 1000 MG tablet Take 1 tablet (1,000 mg total) by mouth 2 (two) times daily. (Patient not taking: Reported on 10/25/2015) 20 tablet 6   No current facility-administered medications for this visit.     PHYSICAL EXAMINATION: ECOG PERFORMANCE STATUS: 1 - Symptomatic but completely ambulatory  Vitals:   11/07/15 0837  BP: (!) 145/64  Pulse: (!) 105  Resp: 19  Temp: 97.9 F (36.6 C)   Filed Weights   11/07/15 0837  Weight: 139 lb (63 kg)    GENERAL:alert, no distress and comfortable SKIN: skin color, texture, turgor are normal, no rashes or significant lesions EYES: normal, Conjunctiva are pink and non-injected, sclera clear OROPHARYNX:no exudate, no erythema and lips, buccal mucosa, and tongue normal  NECK: supple, thyroid normal size, non-tender, without nodularity LYMPH:  no palpable lymphadenopathy in  the cervical, axillary or inguinal LUNGS: clear to auscultation and percussion with normal breathing effort HEART: regular rate & rhythm and no murmurs and no lower extremity edema ABDOMEN:abdomen soft, non-tender and normal bowel sounds MUSCULOSKELETAL:no cyanosis of digits and no clubbing  NEURO: alert & oriented x 3 with fluent speech, no focal motor/sensory deficits EXTREMITIES: No lower extremity edema  LABORATORY DATA:  I have reviewed the data as listed   Chemistry      Component Value Date/Time   NA 141 11/07/2015 0822   K 3.5 11/07/2015 0822   CO2 26 11/07/2015 0822   BUN 15.0 11/07/2015 0822   CREATININE 0.9 11/07/2015 0822      Component Value Date/Time   CALCIUM 8.1 (L) 11/07/2015 0822   ALKPHOS 70 11/07/2015 0822   AST 18 11/07/2015 0822   ALT 16 11/07/2015 0822   BILITOT 0.27 11/07/2015 0822       Lab Results  Component Value Date   WBC 4.3 11/07/2015   HGB 7.6 (L) 11/07/2015   HCT 23.8 (L) 11/07/2015  MCV 97.5 11/07/2015   PLT 161 11/07/2015   NEUTROABS 2.7 11/07/2015     ASSESSMENT & PLAN:  Breast cancer of upper-inner quadrant of left female breast ([place]) Left simple mastectomy with reconstruction 06/24/2015: IDC grade 3, 2.5 cm, with high-grade DCIS, ALH, LVI present, margins negative, 0/1 sentinel node negative, , ER 100%, PR 5%, HER-2 positive ratio 1.56 with average copy #8.25, Ki-67 20%  Pathologic stage:T2 N0 stage II a Treatment plan: 1. Adjuvant chemotherapy with TCHP followed by Herceptin maintenance for 1 year 2. Followed by adjuvant hormonal therapy ---------------------------------------------------------------------------------------------------------- Current treatment: Cycle 6day [place] Perjeta Echocardiogram 07/27/2015 60-65%  Chemotherapy toxicities: 1. Nausea grade 1 2. Thrush: resolved withDiflucan 3. Diarrhea grade 2: Currently on Lomotil andImodium daily. She responded very well to octreotide short-acting injection. I  will order a Sandostatin LAR injection. If her diarrhea was controlled than it would make her life significantly better. She is currently limited to her house primarily because of diarrhea. 4. Dry cough: Probably related to fungal infection: Diflucan was used. 5. Severe fatigue 6. Hypokalemia: on potassium obtaining foods. 7. Alopecia 8. Profound anemia:hemoglobin 7.7.we decreased the dosage of chemotherapy from cycle 3 and once againwith cycle 4. Patient takes oral iron every day.  We are monitoringher very closely for toxicities. Return to clinic in 3weeksfor Herceptin maintenance every 3 weeks. We will also be starting her on oral antiestrogen therapy approximately a month from now. After one year of Herceptin she could be a candidate for Neratinib Patient is planning on undergoing breast reconstruction in December 2017.  RTC in 6 weeks for follow up on Herceptin WIll start AI therapy after reconstruction is complete in Dec   No orders of the defined types were placed in this encounter.  The patient has a good understanding of the overall plan. she agrees with it. she will call with any problems that may develop before the next visit here.   Rulon Eisenmenger, MD 11/07/15

## 2015-11-07 NOTE — Patient Instructions (Addendum)
[facility] Discharge Instructions for Patients Receiving Chemotherapy  Today you received the following chemotherapy agents: Taxotere, Carboplatin, Herceptin and Perjeta.   To help prevent nausea and vomiting after your treatment, we encourage you to take your nausea medication as directed.    If you develop nausea and vomiting that is not controlled by your nausea medication, call the clinic.   BELOW ARE SYMPTOMS THAT SHOULD BE REPORTED IMMEDIATELY:  *FEVER GREATER THAN 100.5 F  *CHILLS WITH OR WITHOUT FEVER  NAUSEA AND VOMITING THAT IS NOT CONTROLLED WITH YOUR NAUSEA MEDICATION  *UNUSUAL SHORTNESS OF BREATH  *UNUSUAL BRUISING OR BLEEDING  TENDERNESS IN MOUTH AND THROAT WITH OR WITHOUT PRESENCE OF ULCERS  *URINARY PROBLEMS  *BOWEL PROBLEMS  UNUSUAL RASH Items with * indicate a potential emergency and should be followed up as soon as possible.  Feel free to call the clinic you have any questions or concerns. The clinic phone number is [phone].  Please show the CHEMO ALERT CARD at check-in to the Emergency Department and triage nurse.   

## 2015-11-18 ENCOUNTER — Encounter: Payer: Self-pay | Admitting: Physician Assistant

## 2015-11-18 ENCOUNTER — Ambulatory Visit (INDEPENDENT_AMBULATORY_CARE_PROVIDER_SITE_OTHER): Payer: BLUE CROSS/BLUE SHIELD | Admitting: Physician Assistant

## 2015-11-18 VITALS — BP 118/77 | HR 110 | Temp 98.1°F | Ht 62.0 in | Wt 134.8 lb

## 2015-11-18 DIAGNOSIS — R197 Diarrhea, unspecified: Secondary | ICD-10-CM

## 2015-11-18 DIAGNOSIS — K529 Noninfective gastroenteritis and colitis, unspecified: Secondary | ICD-10-CM

## 2015-11-18 MED ORDER — ALIGN PO CAPS: 1.0000 | ORAL_CAPSULE | Freq: Every day | ORAL | Status: DC

## 2015-11-18 MED ORDER — SULFASALAZINE 500 MG PO TABS: 500.0000 mg | ORAL_TABLET | Freq: Four times a day (QID) | ORAL | 1 refills | Status: DC

## 2015-11-18 MED ORDER — MESALAMINE 400 MG PO CPDR: 800.0000 mg | DELAYED_RELEASE_CAPSULE | Freq: Three times a day (TID) | ORAL | 1 refills | Status: DC

## 2015-11-18 NOTE — Addendum Note (Signed)
Addended by: Terald Sleeper on: 11/18/2015 05:09 PM   Modules accepted: Orders

## 2015-11-18 NOTE — Progress Notes (Signed)
BP 118/77   Pulse (!) 110   Temp 98.1 F (36.7 C) (Oral)   Ht '5\' 2"'$  (1.575 m)   Wt 134 lb 12.8 oz (61.1 kg)   BMI 24.66 kg/m    Subjective:    Patient ID: Heidi Walls, female    DOB: May 15, 1955, 60 y.o.   MRN: [id number]  HPI: Heidi Walls is a 60 y.o. female presenting on 11/18/2015 for Diarrhea (now is having blood in stool ) Over the past 3 months the patient has had increasing diarrhea that has not been controlled. When she first started her chemotherapy it would be very bad for the first week and then calmed down. After each subsequent treatment which totaled 6, she would have more prolonged and worse diarrhea. At this point she is having multiple bowels through the day they're all water with some [place] bright red bleeding. She will even have to get up at night and go. She has never had a past history of Crohn's disease or ulcerative colitis. Her last colonoscopy was in 2012 and was a screening test. It was normal at that time. She has had some mild anemia through her chemotherapy trial. She will still be following with her oncologist. We have had a long discussion about the chronic nature of this being more than likely secondary to the medication. We have encouraged diet changes and addition of good probiotics in her diet. We are going to attempt to use an inflammatory bowel tight medicine see if this will calm her down. If she does not have some improvement in a week she is to let me know and we will send her for gastroenterology consult.   Past Medical History:  Diagnosis Date  . Anxiety    OCD  . Arthritis    knees and hips  . Breast cancer ([place])   . Breast cancer of upper-inner quadrant of left female breast ([place]) 05/26/2015  . GERD (gastroesophageal reflux disease)   . Hypertension   . Insomnia    Relevant past medical, surgical, family and social history reviewed and updated as indicated. Interim medical history since our last visit reviewed. Allergies and medications  reviewed and updated. DATA REVIEWED: CHART IN EPIC  Social History   Social History  . Marital status: Married    Spouse name: N/A  . Number of children: N/A  . Years of education: N/A   Occupational History  . Not on file.   Social History Main Topics  . Smoking status: Former Research scientist (life sciences)  . Smokeless tobacco: Never Used  . Alcohol use Yes     Comment: social  . Drug use: No  . Sexual activity: Not on file   Other Topics Concern  . Not on file   Social History Narrative  . No narrative on file    Past Surgical History:  Procedure Laterality Date  . BREAST RECONSTRUCTION WITH PLACEMENT OF TISSUE EXPANDER AND FLEX HD (ACELLULAR HYDRATED DERMIS) Left 06/24/2015   Procedure: LEFT BREAST RECONSTRUCTION WITH PLACEMENT OF TISSUE EXPANDER AND  ACELLULAR HYDRATED DERMIS (CORTIVA);  Surgeon: Irene Limbo, MD;  Location: [facility];  Service: Plastics;  Laterality: Left;  . KNEE SURGERY  2000  . MASTECTOMY W/ SENTINEL NODE BIOPSY Left 06/24/2015   Procedure: LEFT TOTAL MASTECTOMY WITH SENTINEL LYMPH NODE BIOPSY;  Surgeon: Excell Seltzer, MD;  Location: [facility];  Service: General;  Laterality: Left;  . PORTACATH PLACEMENT Right 06/24/2015   Procedure: INSERTION PORT-A-CATH;  Surgeon: Marland Kitchen  Hoxworth, MD;  Location: [facility];  Service: General;  Laterality: Right;    Family History  Problem Relation Age of Onset  . Heart failure Mother   . Diabetes Mother   . Hypertension Mother   . Hypertension Father   . Breast cancer Maternal Grandmother     Review of Systems  Constitutional: Positive for fatigue. Negative for activity change and fever.  HENT: Negative.   Eyes: Negative.   Respiratory: Negative.  Negative for cough.   Cardiovascular: Negative.  Negative for chest pain.  Gastrointestinal: Positive for anal bleeding, blood in stool and diarrhea. Negative for abdominal distention, abdominal pain, nausea, rectal pain and  vomiting.  Endocrine: Negative.   Genitourinary: Negative.  Negative for dysuria.  Musculoskeletal: Negative.   Skin: Negative.   Neurological: Negative.       Medication List       Accurate as of 11/18/15 12:52 PM. Always use your most recent med list.          ALPRAZolam 0.25 MG tablet Commonly known as:  XANAX Take 1 tablet (0.25 mg total) by mouth 3 (three) times daily as needed.   bifidobacterium infantis capsule Take 1 capsule by mouth daily.   BIOTIN 5000 5 MG Caps Generic drug:  Biotin Take 5,000 mg by mouth daily.   calcium carbonate 500 MG chewable tablet Commonly known as:  TUMS - dosed in mg elemental calcium Chew 3 tablets by mouth 2 (two) times daily.   citalopram 20 MG tablet Commonly known as:  CELEXA Take 20 mg by mouth every morning.   cyclobenzaprine 10 MG tablet Commonly known as:  FLEXERIL prn   diphenoxylate-atropine 2.5-0.025 MG tablet Commonly known as:  LOMOTIL TAKE 1 TABLET BY MOUTH 4 TIMES A DAY AS NEEDED FOR DIARRHEA   esomeprazole 20 MG capsule Commonly known as:  NEXIUM Take 20 mg by mouth 2 (two) times daily before a meal.   ferrous sulfate 325 (65 FE) MG EC tablet Take 325 mg by mouth 3 (three) times daily with meals.   Fish Oil 1200 MG Caps Take 1,200 mg by mouth daily.   hydrochlorothiazide 12.5 MG tablet Commonly known as:  HYDRODIURIL Take 1 tablet (12.5 mg total) by mouth daily.   HYDROcodone-acetaminophen 5-325 MG tablet Commonly known as:  NORCO/VICODIN TAKE 1 TABLET BY MOUTH EVERY 6 HOURS AS NEEDED FOR MODERATE TO SEVERE PAIN   lidocaine-prilocaine cream Commonly known as:  EMLA Apply to affected area once   LORazepam 0.5 MG tablet Commonly known as:  ATIVAN Take 1 tablet (0.5 mg total) by mouth every 6 (six) hours as needed (Nausea or vomiting).   MELOXICAM PO Take 7.5 mg by mouth.   Mesalamine 400 MG Cpdr DR capsule Commonly known as:  ASACOL Take 2 capsules (800 mg total) by mouth 3 (three) times  daily.   multivitamin tablet Take 1 tablet by mouth daily.   ondansetron 8 MG tablet Commonly known as:  ZOFRAN Take 1 tablet (8 mg total) by mouth 2 (two) times daily as needed for refractory nausea / vomiting. Start on day 3 after chemo.   potassium chloride SA 20 MEQ tablet Commonly known as:  K-DUR,KLOR-CON Take 1 tablet (20 mEq total) by mouth 2 (two) times daily.   prochlorperazine 10 MG tablet Commonly known as:  COMPAZINE Take 1 tablet (10 mg total) by mouth every 6 (six) hours as needed (Nausea or vomiting).   raloxifene 60 MG tablet Commonly known as:  EVISTA Take 1  tablet (60 mg total) by mouth daily.   valACYclovir 1000 MG tablet Commonly known as:  VALTREX Take 1 tablet (1,000 mg total) by mouth 2 (two) times daily.   vitamin C 1000 MG tablet Take 1,000 mg by mouth daily.          Objective:    BP 118/77   Pulse (!) 110   Temp 98.1 F (36.7 C) (Oral)   Ht '5\' 2"'$  (1.575 m)   Wt 134 lb 12.8 oz (61.1 kg)   BMI 24.66 kg/m   Allergies  Allergen Reactions  . Codeine Shortness Of Breath    Pt reports this was 30 years ago.    Wt Readings from Last 3 Encounters:  11/18/15 134 lb 12.8 oz (61.1 kg)  11/07/15 139 lb (63 kg)  11/04/15 138 lb (62.6 kg)    Physical Exam  Constitutional: She is oriented to person, place, and time. She appears well-developed and well-nourished.  HENT:  Head: Normocephalic and atraumatic.  Eyes: Conjunctivae and EOM are normal. Pupils are equal, round, and reactive to light.  Neck: Normal range of motion. Neck supple.  Cardiovascular: Normal rate, regular rhythm, normal heart sounds and intact distal pulses.   Pulmonary/Chest: Effort normal and breath sounds normal.  Abdominal: Soft. She exhibits no shifting dullness, no distension, no abdominal bruit, no ascites and no mass. Bowel sounds are increased. There is no hepatosplenomegaly or splenomegaly. There is no tenderness.  Neurological: She is alert and oriented to person,  place, and time. She has normal reflexes.  Skin: Skin is warm and dry. No rash noted.  Psychiatric: She has a normal mood and affect. Her behavior is normal. Judgment and thought content normal.  Nursing note and vitals reviewed.   Results for orders placed or performed in visit on 11/07/15  CBC with Differential  Result Value Ref Range   WBC 4.3 3.9 - 10.3 10e3/uL   NEUT# 2.7 1.5 - 6.5 10e3/uL   HGB 7.6 (L) 11.6 - 15.9 g/dL   HCT 23.8 (L) 34.8 - 46.6 %   Platelets 161 145 - 400 10e3/uL   MCV 97.5 79.5 - 101.0 fL   MCH 31.1 25.1 - 34.0 pg   MCHC 31.9 31.5 - 36.0 g/dL   RBC 2.44 (L) 3.70 - 5.45 10e6/uL   RDW 18.9 (H) 11.2 - 14.5 %   lymph# 1.0 0.9 - 3.3 10e3/uL   MONO# 0.5 0.1 - 0.9 10e3/uL   Eosinophils Absolute 0.0 0.0 - 0.5 10e3/uL   Basophils Absolute 0.0 0.0 - 0.1 10e3/uL   NEUT% 63.7 38.4 - 76.8 %   LYMPH% 24.0 14.0 - 49.7 %   MONO% 11.6 0.0 - 14.0 %   EOS% 0.0 0.0 - 7.0 %   BASO% 0.7 0.0 - 2.0 %  Comprehensive metabolic panel  Result Value Ref Range   Sodium 141 136 - 145 mEq/L   Potassium 3.5 3.5 - 5.1 mEq/L   Chloride 105 98 - 109 mEq/L   CO2 26 22 - 29 mEq/L   Glucose 83 70 - 140 mg/dl   BUN 15.0 7.0 - 26.0 mg/dL   Creatinine 0.9 0.6 - 1.1 mg/dL   Total Bilirubin 0.27 0.20 - 1.20 mg/dL   Alkaline Phosphatase 70 40 - 150 U/L   AST 18 5 - 34 U/L   ALT 16 0 - 55 U/L   Total Protein 6.7 6.4 - 8.3 g/dL   Albumin 3.6 3.5 - 5.0 g/dL   Calcium 8.1 (L) 8.4 -  10.4 mg/dL   Anion Gap 10 3 - 11 mEq/L   EGFR 71 (L) >90 ml/min/1.73 m2      Assessment & Plan:   1. Inflammatory bowel disease Secondary to course of chemotherapy Uncontrolled by Lomotil and Imodium Asacol 400 mg 2 capsules 3 times a day, take for 2 months. Align over-the-counter probiotic 1 daily  Recheck 4 weeks  2. Diarrhea, unspecified type Bananas, rice, toast, cheese, carrots Continue all other maintenance medications as listed above.  Follow up plan: Return in about 4 weeks (around  12/16/2015).    Educational handout given for Crohn's disease  Terald Sleeper PA-C [facility] [address]  [place], [place] [address] [phone]   11/18/2015, 12:52 PM

## 2015-11-18 NOTE — Patient Instructions (Signed)
Crohn Disease Crohn disease is a long-lasting (chronic) disease that affects your gastrointestinal (GI) tract. It often causes irritation and swelling (inflammation) in your small intestine and the beginning of your large intestine. However, it can affect any part of your GI tract. Crohn disease is part of a group of illnesses that are known as inflammatory bowel disease (IBD). Crohn disease may start slowly and get worse over time. Symptoms may come and go. They may also disappear for months or even years at a time (remission). CAUSES The exact cause of Crohn disease is not known. It may be a response that causes your body's defense system (immune system) to mistakenly attack healthy cells and tissues (autoimmune response). Your genes and your environment may also play a role. RISK FACTORS You may be at greater risk for Crohn disease if you:  Have other family members with Crohn disease or another IBD.  Use any tobacco products, including cigarettes, chewing tobacco, or electronic cigarettes.  Are in your 20s.  Have Eastern European ancestry. SIGNS AND SYMPTOMS The main signs and symptoms of Crohn disease involve your GI tract. These include:  Diarrhea.  Rectal bleeding.  An urgent need to move your bowels.  The feeling that you are not finished having a bowel movement.  Abdominal pain or cramping.  Constipation. General signs and symptoms of Crohn disease may also include:  Unexplained weight loss.  Fatigue.  Fever.  Nausea.  Loss of appetite.  Joint pain  Changes in vision.  Red bumps on your skin. DIAGNOSIS Your health care provider may suspect Crohn disease based on your symptoms and your medical history. Your health care provider will do a physical exam. You may need to see a health care provider who specializes in diseases of the digestive tract (gastroenterologist). You may also have tests to help your health care providers make a diagnosis. These may  include:  Blood tests.  Stool sample tests.  Imaging tests, such as X-rays and CT scans.  Tests to examine the inside of your intestines using a long, flexible tube that has a light and a camera on the end (endoscopy or colonoscopy).  A procedure to take tissue samples from inside your bowel (biopsy) to be examined under a microscope. TREATMENT  There is no cure for Crohn disease. Treatment will focus on managing your symptoms. Crohn disease affects each person differently. Your treatment may include:  Resting your bowels. Drinking only clear liquids or getting nutrition through an IV for a period of time gives your bowels a chance to heal because they are not passing stools.  Medicines. These may be used alone or in combination (combination therapy). These may include antibiotic medicines. You may be given medicines that help to:  Reduce inflammation.  Control your immune system activity.  Fight infections.  Relieve cramps and prevent diarrhea.  Control your pain.  Surgery. You may need surgery if:  Medicines and other treatments are no longer working.  You develop complications from severe Crohn disease.  A section of your intestine becomes so damaged that it needs to be removed. HOME CARE INSTRUCTIONS  Take medicines only as directed by your health care provider.  If you were prescribed an antibiotic medicine, finish it all even if you start to feel better.  Keep all follow-up visits as directed by your health care provider. This is important.  Talk with your health care provider about changing your diet. This may help your symptoms. Your health care provide may recommend changes, such   as:  Drinking more fluids.  Avoiding milk and other foods that contain lactose.  Eating a low-fat diet.  Avoiding high-fiber foods, such as popcorn and nuts.  Avoiding carbonated beverages, such as soda.  Eating smaller meals more often rather than eating large  meals.  Keeping a food diary to identify foods that make your symptoms better or worse.  Do not use any tobacco products, including cigarettes, chewing tobacco, or electronic cigarettes. If you need help quitting, ask your health care provider.  Limit alcohol intake to no more than 1 drink per day for nonpregnant women and 2 drinks per day for men. One drink equals 12 ounces of beer, 5 ounces of wine, or 1 ounces of hard liquor.  Exercise daily or as directed by your health care provider. SEEK MEDICAL CARE IF:  You have diarrhea, abdominal cramps, and other gastrointestinal problems that are present almost all of the time.  Your symptoms do not improve with treatment.  You continue to lose weight.  You develop a rash or sores on your skin.  You develop eye problems.  You have a fever.   Your symptoms get worse.  You develop new symptoms. SEEK IMMEDIATE MEDICAL CARE IF:  You have bloody diarrhea.  You develop severe abdominal pain.  You cannot pass stools.   This information is not intended to replace advice given to you by your health care provider. Make sure you discuss any questions you have with your health care provider.   Document Released: 10/04/2004 Document Revised: 01/15/2014 Document Reviewed: 08/12/2013 Elsevier Interactive Patient Education 2016 [employer]  

## 2015-11-24 ENCOUNTER — Other Ambulatory Visit: Payer: Self-pay | Admitting: Hematology and Oncology

## 2015-11-25 ENCOUNTER — Other Ambulatory Visit: Payer: Self-pay | Admitting: Hematology and Oncology

## 2015-11-25 DIAGNOSIS — Z170 Estrogen receptor positive status [ER+]: Secondary | ICD-10-CM

## 2015-11-25 DIAGNOSIS — C50212 Malignant neoplasm of upper-inner quadrant of left female breast: Secondary | ICD-10-CM

## 2015-11-27 NOTE — Assessment & Plan Note (Signed)
Left simple mastectomy with reconstruction 06/24/2015: IDC grade 3, 2.5 cm, with high-grade DCIS, ALH, LVI present, margins negative, 0/1 sentinel node negative, , ER 100%, PR 5%, HER-2 positive ratio 1.56 with average copy #8.25, Ki-67 20%  Pathologic stage:T2 N0 stage II a Treatment plan: 1. Adjuvant chemotherapy with TCHP X 6 completed 11/07/15 followed by Herceptin maintenance for 1 year 2. Followed by adjuvant hormonal therapy ---------------------------------------------------------------------------------------------------------- Current treatment: Maintenance Herceptin every 3 weeks Echocardiogram 07/27/2015 60-65%  Anastrozole counseling: We discussed the risks and benefits of anti-estrogen therapy with aromatase inhibitors. These include but not limited to insomnia, hot flashes, mood changes, vaginal dryness, bone density loss, and weight gain. We strongly believe that the benefits far outweigh the risks. Patient understands these risks and consented to starting treatment. Planned treatment duration is 5 years.  F/U in 6 weeks for maintenance Herceptin and to assess toxicities to anastrozole terapy

## 2015-11-28 ENCOUNTER — Encounter: Payer: Self-pay | Admitting: *Deleted

## 2015-11-28 ENCOUNTER — Other Ambulatory Visit (HOSPITAL_BASED_OUTPATIENT_CLINIC_OR_DEPARTMENT_OTHER): Payer: BLUE CROSS/BLUE SHIELD

## 2015-11-28 ENCOUNTER — Ambulatory Visit (HOSPITAL_BASED_OUTPATIENT_CLINIC_OR_DEPARTMENT_OTHER): Payer: BLUE CROSS/BLUE SHIELD | Admitting: Hematology and Oncology

## 2015-11-28 ENCOUNTER — Encounter: Payer: Self-pay | Admitting: Hematology and Oncology

## 2015-11-28 ENCOUNTER — Ambulatory Visit (HOSPITAL_BASED_OUTPATIENT_CLINIC_OR_DEPARTMENT_OTHER): Payer: BLUE CROSS/BLUE SHIELD

## 2015-11-28 VITALS — BP 151/69 | HR 96 | Temp 98.1°F | Resp 18 | Wt 132.6 lb

## 2015-11-28 DIAGNOSIS — M810 Age-related osteoporosis without current pathological fracture: Secondary | ICD-10-CM | POA: Diagnosis not present

## 2015-11-28 DIAGNOSIS — E876 Hypokalemia: Secondary | ICD-10-CM | POA: Diagnosis not present

## 2015-11-28 DIAGNOSIS — C50212 Malignant neoplasm of upper-inner quadrant of left female breast: Secondary | ICD-10-CM | POA: Diagnosis not present

## 2015-11-28 DIAGNOSIS — Z170 Estrogen receptor positive status [ER+]: Secondary | ICD-10-CM | POA: Diagnosis not present

## 2015-11-28 LAB — COMPREHENSIVE METABOLIC PANEL
ALBUMIN: 3.7 g/dL (ref 3.5–5.0)
ALK PHOS: 81 U/L (ref 40–150)
ALT: 11 U/L (ref 0–55)
ANION GAP: 12 meq/L — AB (ref 3–11)
AST: 17 U/L (ref 5–34)
BUN: 13.1 mg/dL (ref 7.0–26.0)
CO2: 24 meq/L (ref 22–29)
CREATININE: 0.9 mg/dL (ref 0.6–1.1)
Calcium: 8.5 mg/dL (ref 8.4–10.4)
Chloride: 101 mEq/L (ref 98–109)
EGFR: 67 mL/min/{1.73_m2} — ABNORMAL LOW (ref >90)
Glucose: 152 mg/dl — ABNORMAL HIGH (ref 70–140)
Potassium: 3.5 mEq/L (ref 3.5–5.1)
Sodium: 136 mEq/L (ref 136–145)
TOTAL PROTEIN: 6.8 g/dL (ref 6.4–8.3)

## 2015-11-28 LAB — CBC WITH DIFFERENTIAL/PLATELET
BASO%: 0.7 % (ref 0.0–2.0)
Basophils Absolute: 0 10*3/uL (ref 0.0–0.1)
EOS ABS: 0 10*3/uL (ref 0.0–0.5)
EOS%: 0.2 % (ref 0.0–7.0)
HEMATOCRIT: 24.8 % — AB (ref 34.8–46.6)
HEMOGLOBIN: 8 g/dL — AB (ref 11.6–15.9)
LYMPH#: 0.9 10*3/uL (ref 0.9–3.3)
LYMPH%: 33.9 % (ref 14.0–49.7)
MCH: 32.7 pg (ref 25.1–34.0)
MCHC: 32.3 g/dL (ref 31.5–36.0)
MCV: 101.4 fL — AB (ref 79.5–101.0)
MONO#: 0.3 10*3/uL (ref 0.1–0.9)
MONO%: 11.4 % (ref 0.0–14.0)
NEUT%: 53.8 % (ref 38.4–76.8)
NEUTROS ABS: 1.4 10*3/uL — AB (ref 1.5–6.5)
PLATELETS: 137 10*3/uL — AB (ref 145–400)
RBC: 2.45 10*6/uL — AB (ref 3.70–5.45)
RDW: 15.9 % — AB (ref 11.2–14.5)
WBC: 2.6 10*3/uL — AB (ref 3.9–10.3)

## 2015-11-28 MED ORDER — ANASTROZOLE 1 MG PO TABS: 1.0000 mg | ORAL_TABLET | Freq: Every day | ORAL | 3 refills | Status: DC

## 2015-11-28 MED ORDER — SODIUM CHLORIDE 0.9 % IV SOLN
Freq: Once | INTRAVENOUS | Status: AC
  Administered 2015-11-28: 10:00:00 via INTRAVENOUS

## 2015-11-28 MED ORDER — SODIUM CHLORIDE 0.9% FLUSH
10.0000 mL | INTRAVENOUS | Status: DC | PRN
  Administered 2015-11-28: 10 mL
  Filled 2015-11-28: qty 10

## 2015-11-28 MED ORDER — TRASTUZUMAB CHEMO 150 MG IV SOLR
6.0000 mg/kg | Freq: Once | INTRAVENOUS | Status: AC
  Administered 2015-11-28: 357 mg via INTRAVENOUS
  Filled 2015-11-28: qty 17

## 2015-11-28 MED ORDER — DIPHENHYDRAMINE HCL 25 MG PO CAPS
ORAL_CAPSULE | ORAL | Status: AC
  Filled 2015-11-28: qty 2

## 2015-11-28 MED ORDER — POTASSIUM CHLORIDE CRYS ER 20 MEQ PO TBCR: 20.0000 meq | EXTENDED_RELEASE_TABLET | Freq: Every day | ORAL | 2 refills | Status: DC

## 2015-11-28 MED ORDER — HEPARIN SOD (PORK) LOCK FLUSH 100 UNIT/ML IV SOLN
500.0000 [IU] | Freq: Once | INTRAVENOUS | Status: AC | PRN
  Administered 2015-11-28: 500 [IU]
  Filled 2015-11-28: qty 5

## 2015-11-28 MED ORDER — DIPHENHYDRAMINE HCL 25 MG PO CAPS
50.0000 mg | ORAL_CAPSULE | Freq: Once | ORAL | Status: AC
  Administered 2015-11-28: 50 mg via ORAL

## 2015-11-28 MED ORDER — ACETAMINOPHEN 325 MG PO TABS
ORAL_TABLET | ORAL | Status: AC
  Filled 2015-11-28: qty 2

## 2015-11-28 MED ORDER — ACETAMINOPHEN 325 MG PO TABS
650.0000 mg | ORAL_TABLET | Freq: Once | ORAL | Status: AC
  Administered 2015-11-28: 650 mg via ORAL

## 2015-11-28 NOTE — Patient Instructions (Signed)
[facility] Discharge Instructions for Patients Receiving Chemotherapy  Today you received the following chemotherapy agents: Herceptin   To help prevent nausea and vomiting after your treatment, we encourage you to take your nausea medication as directed.    If you develop nausea and vomiting that is not controlled by your nausea medication, call the clinic.   BELOW ARE SYMPTOMS THAT SHOULD BE REPORTED IMMEDIATELY:  *FEVER GREATER THAN 100.5 F  *CHILLS WITH OR WITHOUT FEVER  NAUSEA AND VOMITING THAT IS NOT CONTROLLED WITH YOUR NAUSEA MEDICATION  *UNUSUAL SHORTNESS OF BREATH  *UNUSUAL BRUISING OR BLEEDING  TENDERNESS IN MOUTH AND THROAT WITH OR WITHOUT PRESENCE OF ULCERS  *URINARY PROBLEMS  *BOWEL PROBLEMS  UNUSUAL RASH Items with * indicate a potential emergency and should be followed up as soon as possible.  Feel free to call the clinic you have any questions or concerns. The clinic phone number is [phone].  Please show the CHEMO ALERT CARD at check-in to the Emergency Department and triage nurse.   

## 2015-11-28 NOTE — Progress Notes (Signed)
Patient Care Team: Terald Sleeper, PA-C as PCP - General ([place]) Sylvan Cheese, NP as Nurse Practitioner (Hematology and Oncology)  DIAGNOSIS:  Encounter Diagnoses  Name Primary?  . Malignant neoplasm of upper-inner quadrant of left breast in female, estrogen receptor positive ([place])   . Post-menopausal osteoporosis Yes  . Hypokalemia    Social Hassan Rowan SUMMARY OF ONCOLOGIC HISTORY:   Breast cancer of upper-inner quadrant of left female breast ([place])   05/26/2015 Initial Diagnosis    Screen detected left breast asymmetry (posterior 1.6 cm): Grade 2-3 IDC ER/PR positive HER-2 positive Ki-67 20% plus calcs (span 6.1 cm): High-grade DCIS with suspicious foci of invasion (4.2 cm apart)      06/24/2015 Surgery    Left simple mastectomy with reconstruction: IDC grade 3, 2.5 cm, with high-grade DCIS, ALH, LVI present, margins negative, 0/1 sentinel node negative, T2 N0 stage II a, ER 100%, PR 5%, HER-2 positive ratio 1.56 with average copy #8.25, Ki-67 20%      07/25/2015 - 11/03/2015 Chemotherapy    Adjuvant chemotherapy with TCH Perjeta 6 cycles followed by Herceptin maintenance for 1 year      11/28/2015 -  Anti-estrogen oral therapy    Adjuvant anastrozole 1 mg daily       CHIEF COMPLIANT: Follow-up on Herceptin  INTERVAL HISTORY: Heidi Walls is a 60 year old with above-mentioned history left breast cancer was treated with mastectomy followed by adjuvant chemotherapy. She completed Joice for 6 cycles as of 11/03/2015. She is here to continue with maintenance Herceptin. She reports that the diarrhea is now better. She would like to reduce potassium supplementation. Energy levels are improving. Taste is improving. She is here also to discuss starting anastrozole therapy. Patient is excited about the plan to get implants 12/23/2015.  REVIEW OF SYSTEMS:   Constitutional: Denies fevers, chills or abnormal weight loss Eyes: Denies blurriness of vision Ears,  nose, mouth, throat, and face: Denies mucositis or sore throat Respiratory: Denies cough, dyspnea or wheezes Cardiovascular: Denies palpitation, chest discomfort Gastrointestinal:  Denies nausea, heartburn or change in bowel habits Skin: Denies abnormal skin rashes Lymphatics: Denies new lymphadenopathy or easy bruising Neurological:Denies numbness, tingling or new weaknesses Behavioral/Psych: Mood is stable, no new changes  Extremities: No lower extremity edema  All other systems were reviewed with the patient and are negative.  I have reviewed the past medical history, past surgical history, social history and family history with the patient and they are unchanged from previous note.  ALLERGIES:  is allergic to codeine.  MEDICATIONS:  Current Outpatient Prescriptions  Medication Sig Dispense Refill  . ALPRAZolam (XANAX) 0.25 MG tablet Take 1 tablet (0.25 mg total) by mouth 3 (three) times daily as needed. 90 tablet 2  . anastrozole (ARIMIDEX) 1 MG tablet Take 1 tablet (1 mg total) by mouth daily. 90 tablet 3  . Ascorbic Acid (VITAMIN C) 1000 MG tablet Take 1,000 mg by mouth daily.    . bifidobacterium infantis (ALIGN) capsule Take 1 capsule by mouth daily. 60 capsule   . Biotin (BIOTIN 5000) 5 MG CAPS Take 5,000 mg by mouth daily.    . calcium carbonate (TUMS - DOSED IN MG ELEMENTAL CALCIUM) 500 MG chewable tablet Chew 3 tablets by mouth 2 (two) times daily.    . citalopram (CELEXA) 20 MG tablet Take 20 mg by mouth every morning.    . cyclobenzaprine (FLEXERIL) 10 MG tablet prn  1  . esomeprazole (NEXIUM) 20 MG capsule Take 20 mg by  mouth 2 (two) times daily before a meal.     . fluconazole (DIFLUCAN) 100 MG tablet TAKE 1 TABLET (100 MG TOTAL) BY MOUTH DAILY. 14 tablet 0  . hydrochlorothiazide (HYDRODIURIL) 12.5 MG tablet Take 1 tablet (12.5 mg total) by mouth daily. 90 tablet 3  . HYDROcodone-acetaminophen (NORCO/VICODIN) 5-325 MG tablet TAKE 1 TABLET BY MOUTH EVERY 6 HOURS AS NEEDED  FOR MODERATE TO SEVERE PAIN  0  . lidocaine-prilocaine (EMLA) cream Apply to affected area once 30 g 3  . MELOXICAM PO Take 7.5 mg by mouth.     . Multiple Vitamin (MULTIVITAMIN) tablet Take 1 tablet by mouth daily.    . Omega-3 Fatty Acids (FISH OIL) 1200 MG CAPS Take 1,200 mg by mouth daily.    . potassium chloride SA (K-DUR,KLOR-CON) 20 MEQ tablet Take 1 tablet (20 mEq total) by mouth daily. 60 tablet 2  . prochlorperazine (COMPAZINE) 10 MG tablet Take 1 tablet (10 mg total) by mouth every 6 (six) hours as needed (Nausea or vomiting). 30 tablet 1  . sulfaSALAzine (AZULFIDINE) 500 MG tablet Take 1 tablet (500 mg total) by mouth 4 (four) times daily. 120 tablet 1  . valACYclovir (VALTREX) 1000 MG tablet Take 1 tablet (1,000 mg total) by mouth 2 (two) times daily. (Patient taking differently: Take 1,000 mg by mouth 2 (two) times daily as needed. ) 20 tablet 6   No current facility-administered medications for this visit.     PHYSICAL EXAMINATION: ECOG PERFORMANCE STATUS: 1 - Symptomatic but completely ambulatory  Vitals:   11/28/15 0858  BP: (!) 151/69  Pulse: 96  Resp: 18  Temp: 98.1 F (36.7 C)   Filed Weights   11/28/15 0858  Weight: 132 lb 9.6 oz (60.1 kg)    GENERAL:alert, no distress and comfortable SKIN: skin color, texture, turgor are normal, no rashes or significant lesions EYES: normal, Conjunctiva are pink and non-injected, sclera clear OROPHARYNX:no exudate, no erythema and lips, buccal mucosa, and tongue normal  NECK: supple, thyroid normal size, non-tender, without nodularity LYMPH:  no palpable lymphadenopathy in the cervical, axillary or inguinal LUNGS: clear to auscultation and percussion with normal breathing effort HEART: regular rate & rhythm and no murmurs and no lower extremity edema ABDOMEN:abdomen soft, non-tender and normal bowel sounds MUSCULOSKELETAL:no cyanosis of digits and no clubbing  NEURO: alert & oriented x 3 with fluent speech, no focal  motor/sensory deficits EXTREMITIES: No lower extremity edema  LABORATORY DATA:  I have reviewed the data as listed   Chemistry      Component Value Date/Time   NA 136 11/28/2015 0842   K 3.5 11/28/2015 0842   CO2 24 11/28/2015 0842   BUN 13.1 11/28/2015 0842   CREATININE 0.9 11/28/2015 0842      Component Value Date/Time   CALCIUM 8.5 11/28/2015 0842   ALKPHOS 81 11/28/2015 0842   AST 17 11/28/2015 0842   ALT 11 11/28/2015 0842   BILITOT <0.22 11/28/2015 0842       Lab Results  Component Value Date   WBC 2.6 (L) 11/28/2015   HGB 8.0 (L) 11/28/2015   HCT 24.8 (L) 11/28/2015   MCV 101.4 (H) 11/28/2015   PLT 137 (L) 11/28/2015   NEUTROABS 1.4 (L) 11/28/2015     ASSESSMENT & PLAN:  Breast cancer of upper-inner quadrant of left female breast ([place]) Left simple mastectomy with reconstruction 06/24/2015: IDC grade 3, 2.5 cm, with high-grade DCIS, ALH, LVI present, margins negative, 0/1 sentinel node negative, , ER 100%, PR  5%, HER-2 positive ratio 1.56 with average copy #8.25, Ki-67 20%  Pathologic stage:T2 N0 stage II a Treatment plan: 1. Adjuvant chemotherapy with TCHP X 6 completed 11/07/15 followed by Herceptin maintenance for 1 year 2. Followed by adjuvant hormonal therapy ---------------------------------------------------------------------------------------------------------- Current treatment: Maintenance Herceptin every 3 weeks Echocardiogram 07/27/2015 60-65%  Anastrozole counseling: We discussed the risks and benefits of anti-estrogen therapy with aromatase inhibitors. These include but not limited to insomnia, hot flashes, mood changes, vaginal dryness, bone density loss, and weight gain. We strongly believe that the benefits far outweigh the risks. Patient understands these risks and consented to starting treatment. Planned treatment duration is 5 years.  We will get a bone density test next month. I encouraged her to continue with calcium and vitamin  D. Patient has a previous diagnosis of osteoporosis. She was taking Evista. I encouraged her to stop Evista and start Fosamax once a week. I discussed risks and benefits of Fosamax including the requirement to take it with a large glass of water and not to lie down immediately after taking it. She also understands risks of osteonecrosis of the jaw and will inform us if she has to undergo teeth extraction  F/U in 6 weeks for maintenance Herceptin and to assess toxicities to anastrozole terapy    Orders Placed This Encounter  Procedures  . DG Bone Density    Standing Status:   Future    Standing Expiration Date:   11/27/2016    Order Specific Question:   Reason for Exam (SYMPTOM  OR DIAGNOSIS REQUIRED)    Answer:   post menopausal osteopenia evaluation starting anti estrogen therapy    Order Specific Question:   Is the patient pregnant?    Answer:   No    Order Specific Question:   Preferred imaging location?    Answer:   [facility]   The patient has a good understanding of the overall plan. she agrees with it. she will call with any problems that may develop before the next visit here.   Rulon Eisenmenger, MD 11/28/15

## 2015-12-07 DIAGNOSIS — Z9012 Acquired absence of left breast and nipple: Secondary | ICD-10-CM | POA: Diagnosis not present

## 2015-12-07 DIAGNOSIS — C50312 Malignant neoplasm of lower-inner quadrant of left female breast: Secondary | ICD-10-CM | POA: Diagnosis not present

## 2015-12-07 NOTE — H&P (Signed)
Subjective:     Patient ID: Heidi Walls is a 60 y.o. female.  HPI  6 months post op mastectomy and expander placement. Plan for implant exchange and lipofilling in 2 weeks. Chemotherapy completed 10.26.17. On adjuvant anastrazole. Had nuclear stress test since last visit, low risk study. Labs from 11.20 with Hb 8.0 and WBC 2.6, ANC 1.4  Presented following screening MMG 05/2015 with left breast mass in the posterior aspect of the breast measuring 1.6 cm. Anterior to this was a 6.1 cm area of calcifications that were biopsy-proven to be high-grade DCIS. The posterior breast tumor biopsy with IDC,  ER/PR +HER-2/neu +. Final pathology with 2.5 cm IDC with DCIS, ALH, margins clear. +lymphovascular invasion, 0/1 SLN.  Prior 34C, does not desire any larger. Left mastectomy 657 g  Review of Systems     Objective:   Physical Exam  Cardiovascular: Normal rate, regular rhythm and normal heart sounds.   Pulmonary/Chest: Effort normal and breath sounds normal.     Abdomen soft with no striae, redundancy no hernias  Left chest superior pole depression/step off to expander SN to nipple R 22 BW R 15 L 12 Nipple to IMF R 8 cm Grade 1 ptosis right, no masses  Assessment:     Left breast cancer LIQ S/p left SSM, TE/ADM reconstruction    Plan:      Anemia and low ANC on last labs, will need to repeat prior to surgery to ensure suitable for surgery. Written Rx given for CBC with diff to be drawn with next Herceptin 12.11.17.  Plan second stage for removal expander and placement implant, lipofilling left chest from abdomen, right mastopexy. Reviewed risks infection, capsular contracture, rippling, MRI surveillance to detect rupture silicone. Reviewed purpose fat grafting to aid with contour thicken flaps. Reviewed donor site incisions, pain, bruising, risk fat necrosis, variable take graft, need to repeat, donor site contour abnormalities   Patient has decided on saline implants, plan  smooth round. Provided example saline implant for review. We discussed her significant wt loss during treatment, she notes would be satisfied with size/projection similar to present expander. Reviewed changes with aging,wt loss/gain and need for revision. Discussed right mastopexy lollipop or anchor type scar, do not anticipate drain on this side.  Anticipate OP surgery. Reviewed abdominal compression for 6 weeks pos op, has Spanx at home to use.  Natrelle 133MX-12-T 400 ml tissue expander placed,  400 ml total fill volume  Irene Limbo, MD [facility] [phone], pin [phone]

## 2015-12-14 ENCOUNTER — Other Ambulatory Visit: Payer: Self-pay | Admitting: Hematology and Oncology

## 2015-12-14 DIAGNOSIS — E876 Hypokalemia: Secondary | ICD-10-CM

## 2015-12-19 ENCOUNTER — Other Ambulatory Visit (HOSPITAL_BASED_OUTPATIENT_CLINIC_OR_DEPARTMENT_OTHER): Payer: BLUE CROSS/BLUE SHIELD

## 2015-12-19 ENCOUNTER — Ambulatory Visit: Payer: BLUE CROSS/BLUE SHIELD | Admitting: Nutrition

## 2015-12-19 ENCOUNTER — Ambulatory Visit (HOSPITAL_BASED_OUTPATIENT_CLINIC_OR_DEPARTMENT_OTHER): Payer: BLUE CROSS/BLUE SHIELD

## 2015-12-19 VITALS — BP 165/71 | HR 98 | Temp 98.0°F | Resp 18

## 2015-12-19 DIAGNOSIS — Z5112 Encounter for antineoplastic immunotherapy: Secondary | ICD-10-CM | POA: Diagnosis not present

## 2015-12-19 DIAGNOSIS — C50212 Malignant neoplasm of upper-inner quadrant of left female breast: Secondary | ICD-10-CM

## 2015-12-19 DIAGNOSIS — Z170 Estrogen receptor positive status [ER+]: Secondary | ICD-10-CM

## 2015-12-19 LAB — COMPREHENSIVE METABOLIC PANEL
ALBUMIN: 3.9 g/dL (ref 3.5–5.0)
ALK PHOS: 72 U/L (ref 40–150)
ALT: 13 U/L (ref 0–55)
ANION GAP: 13 meq/L — AB (ref 3–11)
AST: 18 U/L (ref 5–34)
BUN: 17.1 mg/dL (ref 7.0–26.0)
CALCIUM: 9.2 mg/dL (ref 8.4–10.4)
CHLORIDE: 100 meq/L (ref 98–109)
CO2: 24 mEq/L (ref 22–29)
CREATININE: 0.9 mg/dL (ref 0.6–1.1)
EGFR: 68 mL/min/{1.73_m2} — ABNORMAL LOW (ref >90)
Glucose: 139 mg/dl (ref 70–140)
Potassium: 3.2 mEq/L — ABNORMAL LOW (ref 3.5–5.1)
Sodium: 137 mEq/L (ref 136–145)
Total Bilirubin: <0.22 mg/dL (ref 0.20–1.20)
Total Protein: 7.3 g/dL (ref 6.4–8.3)

## 2015-12-19 LAB — CBC WITH DIFFERENTIAL/PLATELET
BASO%: 0.6 % (ref 0.0–2.0)
BASOS ABS: 0 10*3/uL (ref 0.0–0.1)
EOS%: 1.7 % (ref 0.0–7.0)
Eosinophils Absolute: 0.1 10*3/uL (ref 0.0–0.5)
HEMATOCRIT: 30.4 % — AB (ref 34.8–46.6)
HEMOGLOBIN: 9.7 g/dL — AB (ref 11.6–15.9)
LYMPH#: 0.9 10*3/uL (ref 0.9–3.3)
LYMPH%: 23 % (ref 14.0–49.7)
MCH: 31.6 pg (ref 25.1–34.0)
MCHC: 32 g/dL (ref 31.5–36.0)
MCV: 98.9 fL (ref 79.5–101.0)
MONO#: 0.3 10*3/uL (ref 0.1–0.9)
MONO%: 7.5 % (ref 0.0–14.0)
NEUT#: 2.8 10*3/uL (ref 1.5–6.5)
NEUT%: 67.2 % (ref 38.4–76.8)
PLATELETS: 278 10*3/uL (ref 145–400)
RBC: 3.08 10*6/uL — ABNORMAL LOW (ref 3.70–5.45)
RDW: 13.6 % (ref 11.2–14.5)
WBC: 4.1 10*3/uL (ref 3.9–10.3)

## 2015-12-19 MED ORDER — DIPHENHYDRAMINE HCL 25 MG PO CAPS
50.0000 mg | ORAL_CAPSULE | Freq: Once | ORAL | Status: AC
  Administered 2015-12-19: 50 mg via ORAL

## 2015-12-19 MED ORDER — SODIUM CHLORIDE 0.9% FLUSH
10.0000 mL | INTRAVENOUS | Status: DC | PRN
Start: 2015-12-19 — End: 2015-12-19
  Administered 2015-12-19: 10 mL
  Filled 2015-12-19: qty 10

## 2015-12-19 MED ORDER — HEPARIN SOD (PORK) LOCK FLUSH 100 UNIT/ML IV SOLN
500.0000 [IU] | Freq: Once | INTRAVENOUS | Status: AC | PRN
  Administered 2015-12-19: 500 [IU]
  Filled 2015-12-19: qty 5

## 2015-12-19 MED ORDER — DIPHENHYDRAMINE HCL 25 MG PO CAPS
ORAL_CAPSULE | ORAL | Status: AC
  Filled 2015-12-19: qty 2

## 2015-12-19 MED ORDER — TRASTUZUMAB CHEMO 150 MG IV SOLR
6.0000 mg/kg | Freq: Once | INTRAVENOUS | Status: AC
  Administered 2015-12-19: 357 mg via INTRAVENOUS
  Filled 2015-12-19: qty 17

## 2015-12-19 MED ORDER — ACETAMINOPHEN 325 MG PO TABS
650.0000 mg | ORAL_TABLET | Freq: Once | ORAL | Status: AC
  Administered 2015-12-19: 650 mg via ORAL

## 2015-12-19 MED ORDER — SODIUM CHLORIDE 0.9 % IV SOLN
Freq: Once | INTRAVENOUS | Status: AC
Start: 2015-12-19 — End: 2015-12-19
  Administered 2015-12-19: 09:00:00 via INTRAVENOUS

## 2015-12-19 MED ORDER — ACETAMINOPHEN 325 MG PO TABS
ORAL_TABLET | ORAL | Status: AC
  Filled 2015-12-19: qty 2

## 2015-12-19 NOTE — Progress Notes (Signed)
Patient was identified to be at risk for malnutrition on the MST secondary to weight loss and poor appetite.  60 year old female diagnosed with breast cancer.  She is a patient of Dr. Lindi Adie.  Past medical history includes insomnia, hypertension, GERD, anxiety.  Medications include Xanax, Align, Biotin, Celexa, Nexium, multivitamin, omega-3 fatty acids, and Arimidex  Labs include glucose 139, and albumin 3.9 on December 11.  Noted potassium 3.2.  Height: 5 feet 2 inches. Weight: 132.6 pounds Usual body weight: 150 pounds. BMI: 24.25.  Patient reports she is much improved. Her appetite has improved and her taste is returning. She has no questions or concerns at this time.  Nutrition diagnosis: Unintended weight loss related to inadequate oral intake as evidenced by 11% weight loss from usual body weight.  Intervention: Provided support and encouragement for patient to continue increased calories and protein to promote weight maintenance. Provided contact information for patient for any additional needs.    Monitoring, evaluation, goals: Patient will tolerate adequate calories and protein to minimize further weight loss.  Next visit: Patient will contact me for questions or concerns.

## 2015-12-19 NOTE — Patient Instructions (Signed)
[facility] Discharge Instructions for Patients Receiving Chemotherapy  Today you received the following chemotherapy agents herceptin. To help prevent nausea and vomiting after your treatment, we encourage you to take your nausea medication as directed.  If you develop nausea and vomiting that is not controlled by your nausea medication, call the clinic.   BELOW ARE SYMPTOMS THAT SHOULD BE REPORTED IMMEDIATELY:  *FEVER GREATER THAN 100.5 F  *CHILLS WITH OR WITHOUT FEVER  NAUSEA AND VOMITING THAT IS NOT CONTROLLED WITH YOUR NAUSEA MEDICATION  *UNUSUAL SHORTNESS OF BREATH  *UNUSUAL BRUISING OR BLEEDING  TENDERNESS IN MOUTH AND THROAT WITH OR WITHOUT PRESENCE OF ULCERS  *URINARY PROBLEMS  *BOWEL PROBLEMS  UNUSUAL RASH Items with * indicate a potential emergency and should be followed up as soon as possible.  Feel free to call the clinic you have any questions or concerns. The clinic phone number is [phone].  Please show the CHEMO ALERT CARD at check-in to the Emergency Department and triage nurse.    

## 2015-12-20 ENCOUNTER — Encounter (HOSPITAL_BASED_OUTPATIENT_CLINIC_OR_DEPARTMENT_OTHER): Payer: Self-pay | Admitting: *Deleted

## 2015-12-20 NOTE — Progress Notes (Signed)
Patient K+ 3.2 on CMET from 12-19-15. She states she is taking Klor-con 49meq bid.

## 2015-12-21 NOTE — Progress Notes (Signed)
Pt. Family member picked up Boost this am. Given with instructions for completion before 0530 morning of surgery. verbalized understanding.

## 2015-12-23 ENCOUNTER — Telehealth: Payer: Self-pay | Admitting: Physician Assistant

## 2015-12-23 ENCOUNTER — Ambulatory Visit (HOSPITAL_BASED_OUTPATIENT_CLINIC_OR_DEPARTMENT_OTHER): Payer: BLUE CROSS/BLUE SHIELD | Admitting: Anesthesiology

## 2015-12-23 ENCOUNTER — Encounter (HOSPITAL_BASED_OUTPATIENT_CLINIC_OR_DEPARTMENT_OTHER)
Admission: RE | Disposition: A | Discharge status: Home / Self Care | Payer: Self-pay | Source: Ambulatory Visit | Attending: Plastic Surgery

## 2015-12-23 ENCOUNTER — Encounter (HOSPITAL_BASED_OUTPATIENT_CLINIC_OR_DEPARTMENT_OTHER): Payer: Self-pay | Admitting: Anesthesiology

## 2015-12-23 ENCOUNTER — Ambulatory Visit (HOSPITAL_BASED_OUTPATIENT_CLINIC_OR_DEPARTMENT_OTHER)
Admission: RE | Admit: 2015-12-23 | Discharge: 2015-12-23 | Disposition: A | Discharge status: Home / Self Care | Payer: BLUE CROSS/BLUE SHIELD | Source: Ambulatory Visit | Attending: Plastic Surgery | Admitting: Plastic Surgery

## 2015-12-23 DIAGNOSIS — K219 Gastro-esophageal reflux disease without esophagitis: Secondary | ICD-10-CM | POA: Insufficient documentation

## 2015-12-23 DIAGNOSIS — M1990 Unspecified osteoarthritis, unspecified site: Secondary | ICD-10-CM | POA: Diagnosis not present

## 2015-12-23 DIAGNOSIS — F329 Major depressive disorder, single episode, unspecified: Secondary | ICD-10-CM | POA: Insufficient documentation

## 2015-12-23 DIAGNOSIS — F419 Anxiety disorder, unspecified: Secondary | ICD-10-CM | POA: Diagnosis not present

## 2015-12-23 DIAGNOSIS — D649 Anemia, unspecified: Secondary | ICD-10-CM | POA: Diagnosis not present

## 2015-12-23 DIAGNOSIS — N650 Deformity of reconstructed breast: Secondary | ICD-10-CM | POA: Diagnosis not present

## 2015-12-23 DIAGNOSIS — I10 Essential (primary) hypertension: Secondary | ICD-10-CM | POA: Diagnosis not present

## 2015-12-23 DIAGNOSIS — Z87891 Personal history of nicotine dependence: Secondary | ICD-10-CM | POA: Diagnosis not present

## 2015-12-23 DIAGNOSIS — Z853 Personal history of malignant neoplasm of breast: Secondary | ICD-10-CM | POA: Insufficient documentation

## 2015-12-23 DIAGNOSIS — N651 Disproportion of reconstructed breast: Secondary | ICD-10-CM | POA: Insufficient documentation

## 2015-12-23 DIAGNOSIS — Z9012 Acquired absence of left breast and nipple: Secondary | ICD-10-CM | POA: Insufficient documentation

## 2015-12-23 DIAGNOSIS — Z9221 Personal history of antineoplastic chemotherapy: Secondary | ICD-10-CM | POA: Diagnosis not present

## 2015-12-23 DIAGNOSIS — F411 Generalized anxiety disorder: Secondary | ICD-10-CM

## 2015-12-23 HISTORY — PX: REMOVAL OF BILATERAL TISSUE EXPANDERS WITH PLACEMENT OF BILATERAL BREAST IMPLANTS: SHX6431

## 2015-12-23 HISTORY — PX: LIPOSUCTION WITH LIPOFILLING: SHX6436

## 2015-12-23 HISTORY — PX: MASTOPEXY: SHX5358

## 2015-12-23 SURGERY — REMOVAL, TISSUE EXPANDER, BREAST, BILATERAL, WITH BILATERAL IMPLANT IMPLANT INSERTION
Anesthesia: General | Site: Chest | Laterality: Right

## 2015-12-23 MED ORDER — FENTANYL CITRATE (PF) 100 MCG/2ML IJ SOLN
INTRAMUSCULAR | Status: AC
  Filled 2015-12-23: qty 2

## 2015-12-23 MED ORDER — SCOPOLAMINE 1 MG/3DAYS TD PT72: 1.0000 | MEDICATED_PATCH | Freq: Once | TRANSDERMAL | Status: DC | PRN

## 2015-12-23 MED ORDER — LIDOCAINE 2% (20 MG/ML) 5 ML SYRINGE
INTRAMUSCULAR | Status: AC
  Filled 2015-12-23: qty 5

## 2015-12-23 MED ORDER — ROCURONIUM BROMIDE 10 MG/ML (PF) SYRINGE
PREFILLED_SYRINGE | INTRAVENOUS | Status: AC
  Filled 2015-12-23: qty 10

## 2015-12-23 MED ORDER — OXYCODONE HCL 5 MG/5ML PO SOLN: 5.0000 mg | Freq: Once | ORAL | Status: DC | PRN

## 2015-12-23 MED ORDER — HYDROMORPHONE HCL 1 MG/ML IJ SOLN
0.2500 mg | INTRAMUSCULAR | Status: DC | PRN
  Administered 2015-12-23 (×4): 0.5 mg via INTRAVENOUS

## 2015-12-23 MED ORDER — HYDROCODONE-ACETAMINOPHEN 5-325 MG PO TABS: ORAL_TABLET | ORAL | 0 refills | Status: DC

## 2015-12-23 MED ORDER — HYDROMORPHONE HCL 1 MG/ML IJ SOLN
INTRAMUSCULAR | Status: AC
  Filled 2015-12-23: qty 1

## 2015-12-23 MED ORDER — CHLORHEXIDINE GLUCONATE CLOTH 2 % EX PADS: 6.0000 | MEDICATED_PAD | Freq: Once | CUTANEOUS | Status: DC

## 2015-12-23 MED ORDER — CELECOXIB 200 MG PO CAPS
ORAL_CAPSULE | ORAL | Status: AC
  Filled 2015-12-23: qty 2

## 2015-12-23 MED ORDER — CEFAZOLIN SODIUM-DEXTROSE 2-4 GM/100ML-% IV SOLN
INTRAVENOUS | Status: AC
  Filled 2015-12-23: qty 100

## 2015-12-23 MED ORDER — EPHEDRINE 5 MG/ML INJ
INTRAVENOUS | Status: AC
  Filled 2015-12-23: qty 10

## 2015-12-23 MED ORDER — ACETAMINOPHEN 500 MG PO TABS
1000.0000 mg | ORAL_TABLET | ORAL | Status: AC
  Administered 2015-12-23: 1000 mg via ORAL

## 2015-12-23 MED ORDER — PHENYLEPHRINE 40 MCG/ML (10ML) SYRINGE FOR IV PUSH (FOR BLOOD PRESSURE SUPPORT)
PREFILLED_SYRINGE | INTRAVENOUS | Status: AC
  Filled 2015-12-23: qty 10

## 2015-12-23 MED ORDER — ONDANSETRON HCL 4 MG/2ML IJ SOLN
INTRAMUSCULAR | Status: AC
  Filled 2015-12-23: qty 2

## 2015-12-23 MED ORDER — GENTAMICIN SULFATE 40 MG/ML IJ SOLN
INTRAMUSCULAR | Status: DC | PRN
  Administered 2015-12-23: 1000 mL

## 2015-12-23 MED ORDER — SODIUM CHLORIDE 0.9 % IV SOLN: INTRAVENOUS | Status: DC | PRN

## 2015-12-23 MED ORDER — SUCCINYLCHOLINE CHLORIDE 200 MG/10ML IV SOSY
PREFILLED_SYRINGE | INTRAVENOUS | Status: AC
  Filled 2015-12-23: qty 10

## 2015-12-23 MED ORDER — DEXAMETHASONE SODIUM PHOSPHATE 4 MG/ML IJ SOLN
INTRAMUSCULAR | Status: DC | PRN
  Administered 2015-12-23: 10 mg via INTRAVENOUS

## 2015-12-23 MED ORDER — GABAPENTIN 300 MG PO CAPS
300.0000 mg | ORAL_CAPSULE | ORAL | Status: AC
  Administered 2015-12-23: 300 mg via ORAL

## 2015-12-23 MED ORDER — ONDANSETRON HCL 4 MG/2ML IJ SOLN: 4.0000 mg | Freq: Four times a day (QID) | INTRAMUSCULAR | Status: DC | PRN

## 2015-12-23 MED ORDER — MIDAZOLAM HCL 2 MG/2ML IJ SOLN
INTRAMUSCULAR | Status: AC
  Filled 2015-12-23: qty 2

## 2015-12-23 MED ORDER — GABAPENTIN 300 MG PO CAPS
ORAL_CAPSULE | ORAL | Status: AC
  Filled 2015-12-23: qty 1

## 2015-12-23 MED ORDER — SODIUM BICARBONATE 4 % IV SOLN
INTRAVENOUS | Status: DC | PRN
  Administered 2015-12-23: 275 mL via INTRAMUSCULAR

## 2015-12-23 MED ORDER — OXYCODONE HCL 5 MG PO TABS: 5.0000 mg | ORAL_TABLET | Freq: Once | ORAL | Status: DC | PRN

## 2015-12-23 MED ORDER — ACETAMINOPHEN 500 MG PO TABS
ORAL_TABLET | ORAL | Status: AC
Start: 2015-12-23 — End: 2015-12-23
  Filled 2015-12-23: qty 2

## 2015-12-23 MED ORDER — MIDAZOLAM HCL 2 MG/2ML IJ SOLN
1.0000 mg | INTRAMUSCULAR | Status: DC | PRN
  Administered 2015-12-23: 2 mg via INTRAVENOUS

## 2015-12-23 MED ORDER — CELECOXIB 400 MG PO CAPS
400.0000 mg | ORAL_CAPSULE | ORAL | Status: AC
  Administered 2015-12-23: 400 mg via ORAL

## 2015-12-23 MED ORDER — CEFAZOLIN SODIUM 1 G IJ SOLR
INTRAMUSCULAR | Status: AC
  Filled 2015-12-23: qty 10

## 2015-12-23 MED ORDER — CEFAZOLIN SODIUM-DEXTROSE 2-4 GM/100ML-% IV SOLN
2.0000 g | INTRAVENOUS | Status: AC
  Administered 2015-12-23: 2 g via INTRAVENOUS

## 2015-12-23 MED ORDER — DEXAMETHASONE SODIUM PHOSPHATE 10 MG/ML IJ SOLN
INTRAMUSCULAR | Status: AC
  Filled 2015-12-23: qty 1

## 2015-12-23 MED ORDER — SULFAMETHOXAZOLE-TRIMETHOPRIM 800-160 MG PO TABS: 1.0000 | ORAL_TABLET | Freq: Two times a day (BID) | ORAL | 0 refills | Status: DC

## 2015-12-23 MED ORDER — ALPRAZOLAM 0.25 MG PO TABS: 0.2500 mg | ORAL_TABLET | Freq: Three times a day (TID) | ORAL | 2 refills | Status: DC | PRN

## 2015-12-23 MED ORDER — ROCURONIUM BROMIDE 100 MG/10ML IV SOLN
INTRAVENOUS | Status: DC | PRN
  Administered 2015-12-23: 50 mg via INTRAVENOUS

## 2015-12-23 MED ORDER — LACTATED RINGERS IV SOLN
INTRAVENOUS | Status: DC
  Administered 2015-12-23 (×3): via INTRAVENOUS

## 2015-12-23 MED ORDER — LIDOCAINE HCL (CARDIAC) 20 MG/ML IV SOLN
INTRAVENOUS | Status: DC | PRN
Start: 2015-12-23 — End: 2015-12-23
  Administered 2015-12-23: 30 mg via INTRAVENOUS

## 2015-12-23 MED ORDER — ONDANSETRON HCL 4 MG/2ML IJ SOLN
INTRAMUSCULAR | Status: DC | PRN
  Administered 2015-12-23: 4 mg via INTRAVENOUS

## 2015-12-23 MED ORDER — PROPOFOL 10 MG/ML IV BOLUS
INTRAVENOUS | Status: DC | PRN
  Administered 2015-12-23: 120 mg via INTRAVENOUS

## 2015-12-23 MED ORDER — SUGAMMADEX SODIUM 200 MG/2ML IV SOLN
INTRAVENOUS | Status: DC | PRN
  Administered 2015-12-23: 150 mg via INTRAVENOUS

## 2015-12-23 MED ORDER — FENTANYL CITRATE (PF) 100 MCG/2ML IJ SOLN
50.0000 ug | INTRAMUSCULAR | Status: AC | PRN
  Administered 2015-12-23 (×6): 50 ug via INTRAVENOUS

## 2015-12-23 MED ORDER — CYCLOBENZAPRINE HCL 10 MG PO TABS: 10.0000 mg | ORAL_TABLET | Freq: Three times a day (TID) | ORAL | 2 refills | Status: DC

## 2015-12-23 SURGICAL SUPPLY — 90 items
BAG DECANTER FOR FLEXI CONT (MISCELLANEOUS) ×4 IMPLANT
BINDER ABDOMINAL 10 UNV 27-48 (MISCELLANEOUS) ×1 IMPLANT
BINDER ABDOMINAL 12 SM 30-45 (SOFTGOODS) IMPLANT
BINDER BREAST 3XL (BIND) IMPLANT
BINDER BREAST LRG (GAUZE/BANDAGES/DRESSINGS) ×1 IMPLANT
BINDER BREAST MEDIUM (GAUZE/BANDAGES/DRESSINGS) IMPLANT
BINDER BREAST XLRG (GAUZE/BANDAGES/DRESSINGS) IMPLANT
BINDER BREAST XXLRG (GAUZE/BANDAGES/DRESSINGS) IMPLANT
BLADE SURG 10 STRL SS (BLADE) ×8 IMPLANT
BLADE SURG 11 STRL SS (BLADE) ×4 IMPLANT
BLADE SURG 15 STRL LF DISP TIS (BLADE) IMPLANT
BLADE SURG 15 STRL SS (BLADE)
BNDG GAUZE ELAST 4 BULKY (GAUZE/BANDAGES/DRESSINGS) ×8 IMPLANT
CANISTER LIPO FAT HARVEST (MISCELLANEOUS) ×4 IMPLANT
CANISTER SUCT 1200ML W/VALVE (MISCELLANEOUS) ×4 IMPLANT
CHLORAPREP W/TINT 26ML (MISCELLANEOUS) ×5 IMPLANT
COMPRESSION GARMENT LG MOREWEL (MISCELLANEOUS) IMPLANT
COMPRESSION GARMENT MD MOREWEL (MISCELLANEOUS) IMPLANT
COMPRESSION GARMENT XL MOREWEL (MISCELLANEOUS) IMPLANT
COVER BACK TABLE 60X90IN (DRAPES) ×4 IMPLANT
COVER MAYO STAND STRL (DRAPES) ×5 IMPLANT
DECANTER SPIKE VIAL GLASS SM (MISCELLANEOUS) IMPLANT
DRAIN CHANNEL 15F RND FF W/TCR (WOUND CARE) IMPLANT
DRAIN CHANNEL 19F RND (DRAIN) IMPLANT
DRAPE IMP U-DRAPE 54X76 (DRAPES) IMPLANT
DRAPE TOP ARMCOVERS (MISCELLANEOUS) ×4 IMPLANT
DRAPE U-SHAPE 76X120 STRL (DRAPES) ×4 IMPLANT
DRSG PAD ABDOMINAL 8X10 ST (GAUZE/BANDAGES/DRESSINGS) ×8 IMPLANT
DRSG TEGADERM 2-3/8X2-3/4 SM (GAUZE/BANDAGES/DRESSINGS) IMPLANT
ELECT BLADE 4.0 EZ CLEAN MEGAD (MISCELLANEOUS) ×4
ELECT COATED BLADE 2.86 ST (ELECTRODE) ×4 IMPLANT
ELECT REM PT RETURN 9FT ADLT (ELECTROSURGICAL) ×4
ELECTRODE BLDE 4.0 EZ CLN MEGD (MISCELLANEOUS) ×3 IMPLANT
ELECTRODE REM PT RTRN 9FT ADLT (ELECTROSURGICAL) ×3 IMPLANT
EVACUATOR SILICONE 100CC (DRAIN) IMPLANT
FILTER LIPOSUCTION (MISCELLANEOUS) ×4 IMPLANT
GAUZE SPONGE 4X4 12PLY STRL (GAUZE/BANDAGES/DRESSINGS) IMPLANT
GLOVE BIO SURGEON STRL SZ 6 (GLOVE) ×8 IMPLANT
GLOVE BIOGEL PI IND STRL 7.0 (GLOVE) IMPLANT
GLOVE BIOGEL PI INDICATOR 7.0 (GLOVE) ×2
GLOVE ECLIPSE 6.5 STRL STRAW (GLOVE) ×2 IMPLANT
GOWN STRL REUS W/ TWL LRG LVL3 (GOWN DISPOSABLE) ×6 IMPLANT
GOWN STRL REUS W/TWL LRG LVL3 (GOWN DISPOSABLE) ×8
IMPL BREAST SALINE HP 425CC (Breast) IMPLANT
IMPLANT BREAST SALINE HP 425CC (Breast) ×4 IMPLANT
IV NS 1000ML (IV SOLUTION) ×4
IV NS 1000ML BAXH (IV SOLUTION) IMPLANT
IV NS 500ML (IV SOLUTION)
IV NS 500ML BAXH (IV SOLUTION) IMPLANT
KIT FILL SYSTEM UNIVERSAL (SET/KITS/TRAYS/PACK) IMPLANT
LINER CANISTER 1000CC FLEX (MISCELLANEOUS) ×4 IMPLANT
LIQUID BAND (GAUZE/BANDAGES/DRESSINGS) ×8 IMPLANT
NDL HYPO 25X1 1.5 SAFETY (NEEDLE) IMPLANT
NDL SAFETY ECLIPSE 18X1.5 (NEEDLE) ×3 IMPLANT
NEEDLE HYPO 18GX1.5 SHARP (NEEDLE) ×4
NEEDLE HYPO 25X1 1.5 SAFETY (NEEDLE) IMPLANT
NS IRRIG 1000ML POUR BTL (IV SOLUTION) ×4 IMPLANT
PACK BASIN DAY SURGERY FS (CUSTOM PROCEDURE TRAY) ×4 IMPLANT
PACK UNIVERSAL I (CUSTOM PROCEDURE TRAY) ×3 IMPLANT
PAD ALCOHOL SWAB (MISCELLANEOUS) ×4 IMPLANT
PENCIL BUTTON HOLSTER BLD 10FT (ELECTRODE) ×4 IMPLANT
PIN SAFETY STERILE (MISCELLANEOUS) ×4 IMPLANT
SHEET MEDIUM DRAPE 40X70 STRL (DRAPES) ×7 IMPLANT
SIZER BREAST SGL USE HP 425CC (SIZER) ×4
SIZER BRST SGL USE HP 425CC (SIZER) IMPLANT
SLEEVE SCD COMPRESS KNEE MED (MISCELLANEOUS) ×4 IMPLANT
SPONGE GAUZE 4X4 12PLY STER LF (GAUZE/BANDAGES/DRESSINGS) IMPLANT
SPONGE LAP 18X18 X RAY DECT (DISPOSABLE) ×9 IMPLANT
STAPLER VISISTAT 35W (STAPLE) ×4 IMPLANT
STRIP CLOSURE SKIN 1/2X4 (GAUZE/BANDAGES/DRESSINGS) IMPLANT
SUT ETHILON 2 0 FS 18 (SUTURE) IMPLANT
SUT MNCRL AB 4-0 PS2 18 (SUTURE) ×9 IMPLANT
SUT PDS AB 2-0 CT2 27 (SUTURE) ×1 IMPLANT
SUT VIC AB 3-0 PS1 18 (SUTURE) ×8
SUT VIC AB 3-0 PS1 18XBRD (SUTURE) ×3 IMPLANT
SUT VIC AB 3-0 SH 27 (SUTURE) ×4
SUT VIC AB 3-0 SH 27X BRD (SUTURE) ×6 IMPLANT
SUT VICRYL 4-0 PS2 18IN ABS (SUTURE) ×8 IMPLANT
SYR 10ML LL (SYRINGE) ×12 IMPLANT
SYR 50ML LL SCALE MARK (SYRINGE) ×18 IMPLANT
SYR BULB IRRIGATION 50ML (SYRINGE) ×8 IMPLANT
SYR CONTROL 10ML LL (SYRINGE) ×1 IMPLANT
SYR TB 1ML LL NO SAFETY (SYRINGE) ×4 IMPLANT
TAPE MEASURE VINYL STERILE (MISCELLANEOUS) ×4 IMPLANT
TOWEL OR 17X24 6PK STRL BLUE (TOWEL DISPOSABLE) ×9 IMPLANT
TUBE CONNECTING 20X1/4 (TUBING) ×5 IMPLANT
TUBING INFILTRATION IT-10001 (TUBING) ×4 IMPLANT
TUBING SET GRADUATE ASPIR 12FT (MISCELLANEOUS) ×4 IMPLANT
UNDERPAD 30X30 (UNDERPADS AND DIAPERS) ×8 IMPLANT
YANKAUER SUCT BULB TIP NO VENT (SUCTIONS) ×4 IMPLANT

## 2015-12-23 NOTE — Op Note (Signed)
Operative Note   DATE OF OPERATION: 12.15.17  LOCATION: [facility]-outpatient  SURGICAL DIVISION: Plastic Surgery  PREOPERATIVE DIAGNOSES:  1. History left breast cancer 2. Acquired absence left breast 3. Asymmetry native and reconstructed breast  POSTOPERATIVE DIAGNOSES:  same  PROCEDURE:  1. Removal left tissue expander and placement saline implant 2. Lipofilling to left chest 3. Right mastopexy  SURGEON: Irene Limbo MD MBA  ASSISTANT: none  ANESTHESIA:  General.   123456 ml  COMPLICATIONS: None immediate.   INDICATIONS FOR PROCEDURE:  The patient, Heidi Walls, is a 60 y.o. female born on 08-21-1955, is here for second stage breast reconstruction following skin sparing mastectomy and immediate expander/ADM based reconstruction.   FINDINGS: Full incorporation of ADM noted. 75 ml fat infiltrated over left chest. g reduction mastopexy right. Natrelle Smooth Round High Profile Saline implant placed, Ref [id number], fill volume 425 ml. SN [id number]  DESCRIPTION OF PROCEDURE:  The patient's operative site was marked with the patient in the preoperative area to mark chest midline, sternal notch, desired anterior axillary line, as well as supra and infraumbilical abdomen donor site. Over right breast, location of inframammary fold transferred on anterior surface of breast. With aide of Wise pattern marker, location of new nipple areolar complex and 7 cm vertical limbs marked. The patientwas taken to the operating room. SCDs were placed and IV antibiotics were given. The patient's operative site was prepped and draped in a sterile fashion. A time out was performed and all information was confirmed to be correct.Incision made in left mastectomy scar carried to pectoralis muscle. Muscle divided in direction of fibers. Expander removed. Superior capsulotomies performed. Sizer placed.  In supine position the inframammary fold was marked over right breast. The transverse limbs  then marked. Superior pedicle designed. The NAC was marked with 42 mm cookie cutter. The remainder of pedicle deepithelialized and superiorly based flap developed. Over lower pole, skin and small amount subcutaneous tissue excised. The majority of tissue was preserved and used for autoaugmentation. The inferiorly based tissue was advanced superiorly deep to superior pedicle and secured to chest wall with interrupted 2-0 PDS. The patient was tailor tacked and brought to upright sitting position and assessed for symmetry. A Natrelle High Profile 425 ml implant selected for left chest. Patient returned to supine position. Right breast cavity irrigated and hemostasis obtained. Closure completed with interrupted 3-0 vicryl in dermis for closure of vertical and transverse limbs. The NAC was inset with 4-0 vicryl in dermis. Skin closure completed with 4-0 moncryl.  Stab incision made over bilateral lateral abdomen and tumescent fluid infiltrated over supra and infraumbilical abdomen, total 250 ml tumescent infiltrated. Power assisted liposuction performed to endpoint symmetric contour and soft tissue thickness. The fat was then washed and prepared by gravity for infiltration. Harvested fat was then infiltrated in subcutaneous and intramuscular planes throughout left mastectomy flap.   At this time left breast cavity irrigated with solution containing Ancef, gentamicin, and bacitracin, followed by Betadine. Implant placed in left breast cavity and filled to 425 ml. Fill tubing removed and care taken to ensure proper orientation implant, closure fill tab. Closure was completed with running 3-0 vicryl for approximation of muscle. 4-0 vicryl was placed in dermis and running 4-0 monocryl was used to close skin.   Tissue adhesive applied to breast incisions. The abdominal incisions were approximated with 4-0 monocryl. Dry dressing and abdominal and breast binders applied.   The patient was allowed to wake from  anesthesia, extubated and taken to the  recovery room in satisfactory condition.   SPECIMENS: right mastopexy  DRAINS: none  Irene Limbo, MD [facility] [phone], pin [phone]

## 2015-12-23 NOTE — Anesthesia Postprocedure Evaluation (Signed)
Anesthesia Post Note  Patient: Heidi Walls  Procedure(s) Performed: Procedure(s) (LRB): REMOVAL OF LEFT  TISSUE EXPANDERS WITH PLACEMENT OF LEFT  BREAST IMPLANTS (Left) LIPOFILLING TO LEFT CHEST FROM ABDOMEN   (Left) RIGHT BREAST MASTOPEXY (Right)  Patient location during evaluation: PACU Anesthesia Type: General Level of consciousness: awake and alert and patient cooperative Pain management: pain level controlled Vital Signs Assessment: post-procedure vital signs reviewed and stable Respiratory status: spontaneous breathing and respiratory function stable Cardiovascular status: stable Anesthetic complications: no    Last Vitals:  Vitals:   12/23/15 1200 12/23/15 1207  BP: (!) 152/72 (!) 152/75  Pulse: 88   Resp: 18   Temp:  36.5 C    Last Pain:  Vitals:   12/23/15 1207  TempSrc:   PainSc: 2                  Kita Neace S

## 2015-12-23 NOTE — Telephone Encounter (Signed)
Prescription sent to pharmacy.

## 2015-12-23 NOTE — Discharge Instructions (Signed)
° °  Post Anesthesia Home Care Instructions  Activity: Get plenty of rest for the remainder of the day. A responsible adult should stay with you for 24 hours following the procedure.  For the next 24 hours, DO NOT: -Drive a car -Operate machinery -Drink alcoholic beverages -Take any medication unless instructed by your physician -Make any legal decisions or sign important papers.  Meals: Start with liquid foods such as gelatin or soup. Progress to regular foods as tolerated. Avoid greasy, spicy, heavy foods. If nausea and/or vomiting occur, drink only clear liquids until the nausea and/or vomiting subsides. Call your physician if vomiting continues.  Special Instructions/Symptoms: Your throat may feel dry or sore from the anesthesia or the breathing tube placed in your throat during surgery. If this causes discomfort, gargle with warm salt water. The discomfort should disappear within 24 hours.  If you had a scopolamine patch placed behind your ear for the management of post- operative nausea and/or vomiting:  1. The medication in the patch is effective for 72 hours, after which it should be removed.  Wrap patch in a tissue and discard in the trash. Wash hands thoroughly with soap and water. 2. You may remove the patch earlier than 72 hours if you experience unpleasant side effects which may include dry mouth, dizziness or visual disturbances. 3. Avoid touching the patch. Wash your hands with soap and water after contact with the patch.    Post Anesthesia Home Care Instructions  Activity: Get plenty of rest for the remainder of the day. A responsible adult should stay with you for 24 hours following the procedure.  For the next 24 hours, DO NOT: -Drive a car -Operate machinery -Drink alcoholic beverages -Take any medication unless instructed by your physician -Make any legal decisions or sign important papers.  Meals: Start with liquid foods such as gelatin or soup. Progress to  regular foods as tolerated. Avoid greasy, spicy, heavy foods. If nausea and/or vomiting occur, drink only clear liquids until the nausea and/or vomiting subsides. Call your physician if vomiting continues.  Special Instructions/Symptoms: Your throat may feel dry or sore from the anesthesia or the breathing tube placed in your throat during surgery. If this causes discomfort, gargle with warm salt water. The discomfort should disappear within 24 hours.  If you had a scopolamine patch placed behind your ear for the management of post- operative nausea and/or vomiting:  1. The medication in the patch is effective for 72 hours, after which it should be removed.  Wrap patch in a tissue and discard in the trash. Wash hands thoroughly with soap and water. 2. You may remove the patch earlier than 72 hours if you experience unpleasant side effects which may include dry mouth, dizziness or visual disturbances. 3. Avoid touching the patch. Wash your hands with soap and water after contact with the patch.    

## 2015-12-23 NOTE — Anesthesia Procedure Notes (Signed)
Procedure Name: Intubation Date/Time: 12/23/2015 8:52 AM Performed by: Melynda Ripple D Pre-anesthesia Checklist: Patient identified, Emergency Drugs available, Suction available and Patient being monitored Patient Re-evaluated:Patient Re-evaluated prior to inductionOxygen Delivery Method: Circle system utilized Preoxygenation: Pre-oxygenation with 100% oxygen Intubation Type: IV induction Ventilation: Mask ventilation without difficulty Laryngoscope Size: Mac and 3 Tube type: Oral Tube size: 7.0 mm Number of attempts: 1 Airway Equipment and Method: Stylet and Oral airway Placement Confirmation: ETT inserted through vocal cords under direct vision,  positive ETCO2 and breath sounds checked- equal and bilateral Secured at: 22 cm Tube secured with: Tape Dental Injury: Teeth and Oropharynx as per pre-operative assessment

## 2015-12-23 NOTE — Interval H&P Note (Signed)
History and Physical Interval Note:  12/23/2015 6:59 AM  Heidi Walls  has presented today for surgery, with the diagnosis of HISTORY OF LEFT BREAST CANCCER,ACQUIRED ABSCENCE BREAST,ASSYMENTRY NATIVE AND RECONSTRUCTIVE BREAST  The various methods of treatment have been discussed with the patient and family. After consideration of risks, benefits and other options for treatment, the patient has consented to  Procedure(s): REMOVAL OF LEFT  TISSUE EXPANDERS WITH PLACEMENT OF LEFT  BREAST IMPLANTS (Left) LIPOFILLING TO LEFT CHEST FROM ABDOMEN   (Left) RIGHT BREAST MASTOPEXY (Right) as a surgical intervention .  The patient's history has been reviewed, patient examined, no change in status, stable for surgery.  I have reviewed the patient's chart and labs.  Questions were answered to the patient's satisfaction.     Rylin Saez

## 2015-12-23 NOTE — Telephone Encounter (Signed)
Xanax called to pharmacy on [address] [phone].  Flexeril script went electronically.

## 2015-12-23 NOTE — Anesthesia Preprocedure Evaluation (Signed)
Anesthesia Evaluation  Patient identified by MRN, date of birth, ID band Patient awake    Reviewed: Allergy & Precautions, H&P , NPO status , Patient's Chart, lab work & pertinent test results  Airway Mallampati: II   Neck ROM: full    Dental   Pulmonary former smoker,    breath sounds clear to auscultation       Cardiovascular hypertension,  Rhythm:regular Rate:Normal     Neuro/Psych PSYCHIATRIC DISORDERS Anxiety Depression    GI/Hepatic GERD  ,  Endo/Other    Renal/GU      Musculoskeletal  (+) Arthritis ,   Abdominal   Peds  Hematology   Anesthesia Other Findings   Reproductive/Obstetrics Breast CA                             Anesthesia Physical Anesthesia Plan  ASA: II  Anesthesia Plan: General   Post-op Pain Management:    Induction: Intravenous  Airway Management Planned: Oral ETT  Additional Equipment:   Intra-op Plan:   Post-operative Plan: Extubation in OR  Informed Consent: I have reviewed the patients History and Physical, chart, labs and discussed the procedure including the risks, benefits and alternatives for the proposed anesthesia with the patient or authorized representative who has indicated his/her understanding and acceptance.     Plan Discussed with: CRNA, Anesthesiologist and Surgeon  Anesthesia Plan Comments:         Anesthesia Quick Evaluation

## 2015-12-23 NOTE — Transfer of Care (Signed)
Immediate Anesthesia Transfer of Care Note  Patient: Heidi Walls  Procedure(s) Performed: Procedure(s): REMOVAL OF LEFT  TISSUE EXPANDERS WITH PLACEMENT OF LEFT  BREAST IMPLANTS (Left) LIPOFILLING TO LEFT CHEST FROM ABDOMEN   (Left) RIGHT BREAST MASTOPEXY (Right)  Patient Location: PACU  Anesthesia Type:General  Level of Consciousness: awake and sedated  Airway & Oxygen Therapy: Patient Spontanous Breathing and Patient connected to face mask oxygen  Post-op Assessment: Report given to RN and Post -op Vital signs reviewed and stable  Post vital signs: Reviewed and stable  Last Vitals:  Vitals:   12/23/15 0748  BP: (!) 162/69  Pulse: 88  Resp: 20  Temp: 36.5 C    Last Pain:  Vitals:   12/23/15 0748  TempSrc: Oral         Complications: No apparent anesthesia complications

## 2015-12-26 ENCOUNTER — Encounter (HOSPITAL_BASED_OUTPATIENT_CLINIC_OR_DEPARTMENT_OTHER): Payer: Self-pay | Admitting: Plastic Surgery

## 2016-01-09 NOTE — Assessment & Plan Note (Signed)
Left simple mastectomy with reconstruction 06/24/2015: IDC grade 3, 2.5 cm, with high-grade DCIS, ALH, LVI present, margins negative, 0/1 sentinel node negative, , ER 100%, PR 5%, HER-2 positive ratio 1.56 with average copy #8.25, Ki-67 20%  Pathologic stage:T2 N0 stage II a Treatment plan: 1. Adjuvant chemotherapy with TCHP X 6 completed 11/07/15 followed by Herceptin maintenance for 1 year 2. Followed by adjuvant hormonal therapy ---------------------------------------------------------------------------------------------------------- Current treatment: Maintenance Herceptin every 3 weeks Echocardiogram 07/27/2015 60-65% Anastrozole started 11/28/15  Anastrozole Toxicities:   We will get a bone density test next month. I encouraged her to continue with calcium and vitamin D. Patient has a previous diagnosis of osteoporosis. She was taking Evista. I encouraged her to stop Evista and start Fosamax once a week. I discussed risks and benefits of Fosamax including the requirement to take it with a large glass of water and not to lie down immediately after taking it. She also understands risks of osteonecrosis of the jaw and will inform us if she has to undergo teeth extraction  F/U in 6 weeks for maintenance Herceptin

## 2016-01-09 NOTE — Progress Notes (Signed)
Patient Care Team: Terald Sleeper, PA-C as PCP - General ([place]) Sylvan Cheese, NP as Nurse Practitioner (Hematology and Oncology)  DIAGNOSIS:  Encounter Diagnosis  Name Primary?  . Malignant neoplasm of upper-inner quadrant of left breast in female, estrogen receptor positive ([place])     SUMMARY OF ONCOLOGIC HISTORY:   Breast cancer of upper-inner quadrant of left female breast ([place])   05/26/2015 Initial Diagnosis    Screen detected left breast asymmetry (posterior 1.6 cm): Grade 2-3 IDC ER/PR positive HER-2 positive Ki-67 20% plus calcs (span 6.1 cm): High-grade DCIS with suspicious foci of invasion (4.2 cm apart)      06/24/2015 Surgery    Left simple mastectomy with reconstruction: IDC grade 3, 2.5 cm, with high-grade DCIS, ALH, LVI present, margins negative, 0/1 sentinel node negative, T2 N0 stage II a, ER 100%, PR 5%, HER-2 positive ratio 1.56 with average copy #8.25, Ki-67 20%      07/25/2015 - 11/03/2015 Chemotherapy    Adjuvant chemotherapy with TCH [place] 6 cycles followed by Herceptin maintenance for 1 year      11/28/2015 -  Anti-estrogen oral therapy    Adjuvant anastrozole 1 mg daily       CHIEF COMPLIANT: Follow-up on Herceptin  INTERVAL HISTORY: Heidi Walls is a 61 year old with above-mentioned history left breast cancer was treated with mastectomy followed by adjuvant chemotherapy. She completed [place] for 6 cycles as of 11/03/2015. She is here to continue with maintenance Herceptin. She reports that the diarrhea is now better.  Energy levels are improving. Taste is improving. She started anastrozole therapy in November and is tolerating it well. Patient got breast implants 12/23/2015. She is taking oral potassium supplementation. Her hair is starting to come back. Her appetite has come back. She is trying to eat healthy.  REVIEW OF SYSTEMS:   Constitutional: Denies fevers, chills or abnormal weight loss Eyes: Denies blurriness of  vision Ears, nose, mouth, throat, and face: Denies mucositis or sore throat Respiratory: Denies cough, dyspnea or wheezes Cardiovascular: Denies palpitation, chest discomfort Gastrointestinal:  Denies nausea, heartburn or change in bowel habits Skin: Denies abnormal skin rashes Lymphatics: Denies new lymphadenopathy or easy bruising Neurological:Denies numbness, tingling or new weaknesses Behavioral/Psych: Mood is stable, no new changes  Extremities: No lower extremity edema Breast:  denies any pain or lumps or nodules in either breasts All other systems were reviewed with the patient and are negative.  I have reviewed the past medical history, past surgical history, social history and family history with the patient and they are unchanged from previous note.  ALLERGIES:  is allergic to codeine.  MEDICATIONS:  Current Outpatient Prescriptions  Medication Sig Dispense Refill  . ALPRAZolam (XANAX) 0.25 MG tablet Take 1 tablet (0.25 mg total) by mouth 3 (three) times daily as needed. 90 tablet 2  . anastrozole (ARIMIDEX) 1 MG tablet Take 1 tablet (1 mg total) by mouth daily. 90 tablet 3  . Ascorbic Acid (VITAMIN C) 1000 MG tablet Take 1,000 mg by mouth daily.    . bifidobacterium infantis (ALIGN) capsule Take 1 capsule by mouth daily. 60 capsule   . Biotin (BIOTIN 5000) 5 MG CAPS Take 5,000 mg by mouth daily.    . calcium carbonate (TUMS - DOSED IN MG ELEMENTAL CALCIUM) 500 MG chewable tablet Chew 3 tablets by mouth 2 (two) times daily.    . citalopram (CELEXA) 20 MG tablet Take 20 mg by mouth every morning.    . cyclobenzaprine (FLEXERIL) 10  MG tablet Take 1 tablet (10 mg total) by mouth 3 (three) times daily. 60 tablet 2  . esomeprazole (NEXIUM) 20 MG capsule Take 20 mg by mouth 2 (two) times daily before a meal.     . hydrochlorothiazide (HYDRODIURIL) 12.5 MG tablet Take 1 tablet (12.5 mg total) by mouth daily. 90 tablet 3  . KLOR-CON M20 20 MEQ tablet TAKE 1 TABLET (20 MEQ TOTAL) BY  MOUTH 2 (TWO) TIMES DAILY. 60 tablet 2  . lidocaine-prilocaine (EMLA) cream Apply to affected area once 30 g 3  . MELOXICAM PO Take 7.5 mg by mouth.     . Multiple Vitamin (MULTIVITAMIN) tablet Take 1 tablet by mouth daily.    . Omega-3 Fatty Acids (FISH OIL) 1200 MG CAPS Take 1,200 mg by mouth daily.    . potassium chloride SA (K-DUR,KLOR-CON) 20 MEQ tablet Take 1 tablet (20 mEq total) by mouth daily. 60 tablet 2  . prochlorperazine (COMPAZINE) 10 MG tablet Take 1 tablet (10 mg total) by mouth every 6 (six) hours as needed (Nausea or vomiting). 30 tablet 1  . valACYclovir (VALTREX) 1000 MG tablet Take 1 tablet (1,000 mg total) by mouth 2 (two) times daily. (Patient taking differently: Take 1,000 mg by mouth 2 (two) times daily as needed. ) 20 tablet 6   No current facility-administered medications for this visit.     PHYSICAL EXAMINATION: ECOG PERFORMANCE STATUS: 1 - Symptomatic but completely ambulatory  Vitals:   01/10/16 0930  BP: (!) 162/75  Pulse: 93  Resp: 18  Temp: 98.1 F (36.7 C)   Filed Weights   01/10/16 0930  Weight: 136 lb (61.7 kg)    GENERAL:alert, no distress and comfortable SKIN: skin color, texture, turgor are normal, no rashes or significant lesions EYES: normal, Conjunctiva are pink and non-injected, sclera clear OROPHARYNX:no exudate, no erythema and lips, buccal mucosa, and tongue normal  NECK: supple, thyroid normal size, non-tender, without nodularity LYMPH:  no palpable lymphadenopathy in the cervical, axillary or inguinal LUNGS: clear to auscultation and percussion with normal breathing effort HEART: regular rate & rhythm and no murmurs and no lower extremity edema ABDOMEN:abdomen soft, non-tender and normal bowel sounds MUSCULOSKELETAL:no cyanosis of digits and no clubbing  NEURO: alert & oriented x 3 with fluent speech, no focal motor/sensory deficits EXTREMITIES: No lower extremity edema  LABORATORY DATA:  I have reviewed the data as listed    Chemistry      Component Value Date/Time   NA 137 12/19/2015 0842   K 3.2 (L) 12/19/2015 0842   CO2 24 12/19/2015 0842   BUN 17.1 12/19/2015 0842   CREATININE 0.9 12/19/2015 0842      Component Value Date/Time   CALCIUM 9.2 12/19/2015 0842   ALKPHOS 72 12/19/2015 0842   AST 18 12/19/2015 0842   ALT 13 12/19/2015 0842   BILITOT <0.22 12/19/2015 0842       Lab Results  Component Value Date   WBC 4.0 01/10/2016   HGB 8.9 (L) 01/10/2016   HCT 28.3 (L) 01/10/2016   MCV 96.3 01/10/2016   PLT 193 01/10/2016   NEUTROABS 2.6 01/10/2016    ASSESSMENT & PLAN:  Breast cancer of upper-inner quadrant of left female breast ([place]) Left simple mastectomy with reconstruction 06/24/2015: IDC grade 3, 2.5 cm, with high-grade DCIS, ALH, LVI present, margins negative, 0/1 sentinel node negative, , ER 100%, PR 5%, HER-2 positive ratio 1.56 with average copy #8.25, Ki-67 20%  Pathologic stage:T2 N0 stage II a Treatment plan: 1.  Adjuvant chemotherapy with TCHP X 6 completed 11/07/15 followed by Herceptin maintenance for 1 year 2. Followed by adjuvant hormonal therapy ---------------------------------------------------------------------------------------------------------- Current treatment: Maintenance Herceptin every 3 weeks Echocardiogram 07/27/2015 60-65% Anastrozole started 11/28/15  Anastrozole Toxicities: Denies any hot flashes or myalgias.  We will get a bone density test next month. I encouraged her to continue with calcium and vitamin D. Patient has a previous diagnosis of osteoporosis. She was taking Evista. I encouraged her to stop Evista and start Fosamax once a week. I discussed risks and benefits of Fosamax including the requirement to take it with a large glass of water and not to lie down immediately after taking it. She also understands risks of osteonecrosis of the jaw and will inform us if she has to undergo teeth extraction  F/U in 6 weeks for maintenance Herceptin     No orders of the defined types were placed in this encounter.  The patient has a good understanding of the overall plan. she agrees with it. she will call with any problems that may develop before the next visit here.   Rulon Eisenmenger, MD 01/10/16

## 2016-01-10 ENCOUNTER — Ambulatory Visit (HOSPITAL_BASED_OUTPATIENT_CLINIC_OR_DEPARTMENT_OTHER): Payer: BLUE CROSS/BLUE SHIELD

## 2016-01-10 ENCOUNTER — Encounter: Payer: Self-pay | Admitting: Hematology and Oncology

## 2016-01-10 ENCOUNTER — Ambulatory Visit (HOSPITAL_BASED_OUTPATIENT_CLINIC_OR_DEPARTMENT_OTHER): Payer: BLUE CROSS/BLUE SHIELD | Admitting: Hematology and Oncology

## 2016-01-10 ENCOUNTER — Other Ambulatory Visit (HOSPITAL_BASED_OUTPATIENT_CLINIC_OR_DEPARTMENT_OTHER): Payer: BLUE CROSS/BLUE SHIELD

## 2016-01-10 DIAGNOSIS — Z170 Estrogen receptor positive status [ER+]: Secondary | ICD-10-CM

## 2016-01-10 DIAGNOSIS — C50212 Malignant neoplasm of upper-inner quadrant of left female breast: Secondary | ICD-10-CM

## 2016-01-10 DIAGNOSIS — M810 Age-related osteoporosis without current pathological fracture: Secondary | ICD-10-CM | POA: Diagnosis not present

## 2016-01-10 DIAGNOSIS — Z79811 Long term (current) use of aromatase inhibitors: Secondary | ICD-10-CM | POA: Diagnosis not present

## 2016-01-10 DIAGNOSIS — Z5112 Encounter for antineoplastic immunotherapy: Secondary | ICD-10-CM

## 2016-01-10 LAB — COMPREHENSIVE METABOLIC PANEL
ALBUMIN: 3.8 g/dL (ref 3.5–5.0)
ALK PHOS: 73 U/L (ref 40–150)
ALT: 14 U/L (ref 0–55)
AST: 18 U/L (ref 5–34)
Anion Gap: 10 mEq/L (ref 3–11)
BUN: 14.1 mg/dL (ref 7.0–26.0)
CHLORIDE: 100 meq/L (ref 98–109)
CO2: 27 meq/L (ref 22–29)
Calcium: 9.4 mg/dL (ref 8.4–10.4)
Creatinine: 0.9 mg/dL (ref 0.6–1.1)
EGFR: 70 mL/min/{1.73_m2} — AB (ref >90)
GLUCOSE: 104 mg/dL (ref 70–140)
POTASSIUM: 3.8 meq/L (ref 3.5–5.1)
SODIUM: 136 meq/L (ref 136–145)
Total Bilirubin: <0.22 mg/dL (ref 0.20–1.20)
Total Protein: 7 g/dL (ref 6.4–8.3)

## 2016-01-10 LAB — CBC WITH DIFFERENTIAL/PLATELET
BASO%: 0.3 % (ref 0.0–2.0)
BASOS ABS: 0 10*3/uL (ref 0.0–0.1)
EOS%: 1.8 % (ref 0.0–7.0)
Eosinophils Absolute: 0.1 10*3/uL (ref 0.0–0.5)
HCT: 28.3 % — ABNORMAL LOW (ref 34.8–46.6)
HEMOGLOBIN: 8.9 g/dL — AB (ref 11.6–15.9)
LYMPH%: 19 % (ref 14.0–49.7)
MCH: 30.3 pg (ref 25.1–34.0)
MCHC: 31.4 g/dL — ABNORMAL LOW (ref 31.5–36.0)
MCV: 96.3 fL (ref 79.5–101.0)
MONO#: 0.6 10*3/uL (ref 0.1–0.9)
MONO%: 14 % (ref 0.0–14.0)
NEUT#: 2.6 10*3/uL (ref 1.5–6.5)
NEUT%: 64.9 % (ref 38.4–76.8)
Platelets: 193 10*3/uL (ref 145–400)
RBC: 2.94 10*6/uL — AB (ref 3.70–5.45)
RDW: 12.8 % (ref 11.2–14.5)
WBC: 4 10*3/uL (ref 3.9–10.3)
lymph#: 0.8 10*3/uL — ABNORMAL LOW (ref 0.9–3.3)

## 2016-01-10 MED ORDER — SODIUM CHLORIDE 0.9 % IV SOLN
Freq: Once | INTRAVENOUS | Status: AC
  Administered 2016-01-10: 10:00:00 via INTRAVENOUS

## 2016-01-10 MED ORDER — ACETAMINOPHEN 325 MG PO TABS
ORAL_TABLET | ORAL | Status: AC
  Filled 2016-01-10: qty 2

## 2016-01-10 MED ORDER — ACETAMINOPHEN 325 MG PO TABS
650.0000 mg | ORAL_TABLET | Freq: Once | ORAL | Status: AC
  Administered 2016-01-10: 650 mg via ORAL

## 2016-01-10 MED ORDER — HEPARIN SOD (PORK) LOCK FLUSH 100 UNIT/ML IV SOLN
500.0000 [IU] | Freq: Once | INTRAVENOUS | Status: AC | PRN
  Administered 2016-01-10: 500 [IU]
  Filled 2016-01-10: qty 5

## 2016-01-10 MED ORDER — ALENDRONATE SODIUM 70 MG PO TABS: 70.0000 mg | ORAL_TABLET | ORAL | 3 refills | Status: DC

## 2016-01-10 MED ORDER — DIPHENHYDRAMINE HCL 25 MG PO CAPS
50.0000 mg | ORAL_CAPSULE | Freq: Once | ORAL | Status: AC
  Administered 2016-01-10: 50 mg via ORAL

## 2016-01-10 MED ORDER — DIPHENHYDRAMINE HCL 25 MG PO CAPS
ORAL_CAPSULE | ORAL | Status: AC
  Filled 2016-01-10: qty 2

## 2016-01-10 MED ORDER — TRASTUZUMAB CHEMO 150 MG IV SOLR
6.0000 mg/kg | Freq: Once | INTRAVENOUS | Status: AC
  Administered 2016-01-10: 357 mg via INTRAVENOUS
  Filled 2016-01-10: qty 17

## 2016-01-10 MED ORDER — SODIUM CHLORIDE 0.9% FLUSH
10.0000 mL | INTRAVENOUS | Status: DC | PRN
  Administered 2016-01-10: 10 mL
  Filled 2016-01-10: qty 10

## 2016-01-10 NOTE — Patient Instructions (Signed)
[facility] Discharge Instructions for Patients Receiving Chemotherapy  Today you received the following chemotherapy agents herceptin. To help prevent nausea and vomiting after your treatment, we encourage you to take your nausea medication as directed.  If you develop nausea and vomiting that is not controlled by your nausea medication, call the clinic.   BELOW ARE SYMPTOMS THAT SHOULD BE REPORTED IMMEDIATELY:  *FEVER GREATER THAN 100.5 F  *CHILLS WITH OR WITHOUT FEVER  NAUSEA AND VOMITING THAT IS NOT CONTROLLED WITH YOUR NAUSEA MEDICATION  *UNUSUAL SHORTNESS OF BREATH  *UNUSUAL BRUISING OR BLEEDING  TENDERNESS IN MOUTH AND THROAT WITH OR WITHOUT PRESENCE OF ULCERS  *URINARY PROBLEMS  *BOWEL PROBLEMS  UNUSUAL RASH Items with * indicate a potential emergency and should be followed up as soon as possible.  Feel free to call the clinic you have any questions or concerns. The clinic phone number is [phone].  Please show the CHEMO ALERT CARD at check-in to the Emergency Department and triage nurse.    

## 2016-01-12 ENCOUNTER — Telehealth: Payer: Self-pay | Admitting: Physician Assistant

## 2016-01-12 MED ORDER — BENZONATATE 200 MG PO CAPS: 200.0000 mg | ORAL_CAPSULE | Freq: Three times a day (TID) | ORAL | 2 refills | Status: DC | PRN

## 2016-01-12 NOTE — Telephone Encounter (Signed)
Prescription sent to pharmacy.

## 2016-01-30 ENCOUNTER — Ambulatory Visit: Payer: BLUE CROSS/BLUE SHIELD

## 2016-01-31 ENCOUNTER — Ambulatory Visit (HOSPITAL_BASED_OUTPATIENT_CLINIC_OR_DEPARTMENT_OTHER): Payer: BLUE CROSS/BLUE SHIELD

## 2016-01-31 VITALS — BP 148/68 | HR 96 | Temp 98.0°F | Resp 16

## 2016-01-31 DIAGNOSIS — C50212 Malignant neoplasm of upper-inner quadrant of left female breast: Secondary | ICD-10-CM

## 2016-01-31 DIAGNOSIS — Z170 Estrogen receptor positive status [ER+]: Secondary | ICD-10-CM

## 2016-01-31 DIAGNOSIS — Z5112 Encounter for antineoplastic immunotherapy: Secondary | ICD-10-CM | POA: Diagnosis not present

## 2016-01-31 MED ORDER — ACETAMINOPHEN 325 MG PO TABS
ORAL_TABLET | ORAL | Status: AC
  Filled 2016-01-31: qty 2

## 2016-01-31 MED ORDER — SODIUM CHLORIDE 0.9% FLUSH
10.0000 mL | INTRAVENOUS | Status: DC | PRN
  Administered 2016-01-31: 10 mL
  Filled 2016-01-31: qty 10

## 2016-01-31 MED ORDER — ACETAMINOPHEN 325 MG PO TABS
650.0000 mg | ORAL_TABLET | Freq: Once | ORAL | Status: AC
  Administered 2016-01-31: 650 mg via ORAL

## 2016-01-31 MED ORDER — SODIUM CHLORIDE 0.9 % IV SOLN
Freq: Once | INTRAVENOUS | Status: AC
  Administered 2016-01-31: 09:00:00 via INTRAVENOUS

## 2016-01-31 MED ORDER — DIPHENHYDRAMINE HCL 25 MG PO CAPS
50.0000 mg | ORAL_CAPSULE | Freq: Once | ORAL | Status: AC
  Administered 2016-01-31: 50 mg via ORAL

## 2016-01-31 MED ORDER — SODIUM CHLORIDE 0.9 % IV SOLN
6.0000 mg/kg | Freq: Once | INTRAVENOUS | Status: AC
  Administered 2016-01-31: 357 mg via INTRAVENOUS
  Filled 2016-01-31: qty 17

## 2016-01-31 MED ORDER — DIPHENHYDRAMINE HCL 25 MG PO CAPS
ORAL_CAPSULE | ORAL | Status: AC
  Filled 2016-01-31: qty 2

## 2016-01-31 MED ORDER — HEPARIN SOD (PORK) LOCK FLUSH 100 UNIT/ML IV SOLN
500.0000 [IU] | Freq: Once | INTRAVENOUS | Status: AC | PRN
  Administered 2016-01-31: 500 [IU]
  Filled 2016-01-31: qty 5

## 2016-01-31 NOTE — Patient Instructions (Signed)
[facility] Discharge Instructions for Patients Receiving Chemotherapy  Today you received the following chemotherapy agents: Herceptin   To help prevent nausea and vomiting after your treatment, we encourage you to take your nausea medication as directed.    If you develop nausea and vomiting that is not controlled by your nausea medication, call the clinic.   BELOW ARE SYMPTOMS THAT SHOULD BE REPORTED IMMEDIATELY:  *FEVER GREATER THAN 100.5 F  *CHILLS WITH OR WITHOUT FEVER  NAUSEA AND VOMITING THAT IS NOT CONTROLLED WITH YOUR NAUSEA MEDICATION  *UNUSUAL SHORTNESS OF BREATH  *UNUSUAL BRUISING OR BLEEDING  TENDERNESS IN MOUTH AND THROAT WITH OR WITHOUT PRESENCE OF ULCERS  *URINARY PROBLEMS  *BOWEL PROBLEMS  UNUSUAL RASH Items with * indicate a potential emergency and should be followed up as soon as possible.  Feel free to call the clinic you have any questions or concerns. The clinic phone number is [phone].  Please show the CHEMO ALERT CARD at check-in to the Emergency Department and triage nurse.   

## 2016-02-20 ENCOUNTER — Other Ambulatory Visit: Payer: BLUE CROSS/BLUE SHIELD

## 2016-02-20 ENCOUNTER — Ambulatory Visit: Payer: BLUE CROSS/BLUE SHIELD | Admitting: Hematology and Oncology

## 2016-02-20 ENCOUNTER — Ambulatory Visit: Payer: BLUE CROSS/BLUE SHIELD

## 2016-02-21 ENCOUNTER — Other Ambulatory Visit (HOSPITAL_BASED_OUTPATIENT_CLINIC_OR_DEPARTMENT_OTHER): Payer: BLUE CROSS/BLUE SHIELD

## 2016-02-21 ENCOUNTER — Ambulatory Visit (HOSPITAL_COMMUNITY)
Admission: RE | Admit: 2016-02-21 | Discharge: 2016-02-21 | Disposition: A | Discharge status: Home / Self Care | Payer: BLUE CROSS/BLUE SHIELD | Source: Ambulatory Visit | Attending: Physician Assistant | Admitting: Physician Assistant

## 2016-02-21 ENCOUNTER — Ambulatory Visit (HOSPITAL_BASED_OUTPATIENT_CLINIC_OR_DEPARTMENT_OTHER): Payer: BLUE CROSS/BLUE SHIELD | Admitting: Hematology and Oncology

## 2016-02-21 ENCOUNTER — Encounter: Payer: Self-pay | Admitting: Hematology and Oncology

## 2016-02-21 ENCOUNTER — Ambulatory Visit (HOSPITAL_BASED_OUTPATIENT_CLINIC_OR_DEPARTMENT_OTHER)
Admission: RE | Admit: 2016-02-21 | Discharge: 2016-02-21 | Disposition: A | Discharge status: Home / Self Care | Payer: BLUE CROSS/BLUE SHIELD | Source: Ambulatory Visit | Attending: Cardiology | Admitting: Cardiology

## 2016-02-21 ENCOUNTER — Ambulatory Visit (HOSPITAL_BASED_OUTPATIENT_CLINIC_OR_DEPARTMENT_OTHER): Payer: BLUE CROSS/BLUE SHIELD

## 2016-02-21 ENCOUNTER — Encounter: Payer: Self-pay | Admitting: *Deleted

## 2016-02-21 VITALS — BP 147/61 | HR 82 | Wt 134.0 lb

## 2016-02-21 DIAGNOSIS — K219 Gastro-esophageal reflux disease without esophagitis: Secondary | ICD-10-CM | POA: Diagnosis not present

## 2016-02-21 DIAGNOSIS — Z170 Estrogen receptor positive status [ER+]: Secondary | ICD-10-CM

## 2016-02-21 DIAGNOSIS — Z9012 Acquired absence of left breast and nipple: Secondary | ICD-10-CM | POA: Diagnosis not present

## 2016-02-21 DIAGNOSIS — Z833 Family history of diabetes mellitus: Secondary | ICD-10-CM | POA: Diagnosis not present

## 2016-02-21 DIAGNOSIS — R079 Chest pain, unspecified: Secondary | ICD-10-CM | POA: Diagnosis not present

## 2016-02-21 DIAGNOSIS — Z87891 Personal history of nicotine dependence: Secondary | ICD-10-CM | POA: Diagnosis not present

## 2016-02-21 DIAGNOSIS — Z853 Personal history of malignant neoplasm of breast: Secondary | ICD-10-CM | POA: Diagnosis not present

## 2016-02-21 DIAGNOSIS — Z5112 Encounter for antineoplastic immunotherapy: Secondary | ICD-10-CM

## 2016-02-21 DIAGNOSIS — I10 Essential (primary) hypertension: Secondary | ICD-10-CM | POA: Diagnosis not present

## 2016-02-21 DIAGNOSIS — R0789 Other chest pain: Secondary | ICD-10-CM

## 2016-02-21 DIAGNOSIS — Z803 Family history of malignant neoplasm of breast: Secondary | ICD-10-CM | POA: Diagnosis not present

## 2016-02-21 DIAGNOSIS — C50212 Malignant neoplasm of upper-inner quadrant of left female breast: Secondary | ICD-10-CM

## 2016-02-21 DIAGNOSIS — Z79899 Other long term (current) drug therapy: Secondary | ICD-10-CM | POA: Insufficient documentation

## 2016-02-21 DIAGNOSIS — Z79811 Long term (current) use of aromatase inhibitors: Secondary | ICD-10-CM

## 2016-02-21 DIAGNOSIS — M810 Age-related osteoporosis without current pathological fracture: Secondary | ICD-10-CM | POA: Diagnosis not present

## 2016-02-21 DIAGNOSIS — Z9882 Breast implant status: Secondary | ICD-10-CM | POA: Insufficient documentation

## 2016-02-21 DIAGNOSIS — Z8249 Family history of ischemic heart disease and other diseases of the circulatory system: Secondary | ICD-10-CM | POA: Insufficient documentation

## 2016-02-21 DIAGNOSIS — F419 Anxiety disorder, unspecified: Secondary | ICD-10-CM | POA: Diagnosis not present

## 2016-02-21 LAB — COMPREHENSIVE METABOLIC PANEL
ALT: 17 U/L (ref 0–55)
AST: 18 U/L (ref 5–34)
Albumin: 4.2 g/dL (ref 3.5–5.0)
Alkaline Phosphatase: 88 U/L (ref 40–150)
Anion Gap: 12 mEq/L — ABNORMAL HIGH (ref 3–11)
BUN: 25.3 mg/dL (ref 7.0–26.0)
CALCIUM: 10 mg/dL (ref 8.4–10.4)
CHLORIDE: 99 meq/L (ref 98–109)
CO2: 28 meq/L (ref 22–29)
Creatinine: 1 mg/dL (ref 0.6–1.1)
EGFR: 59 mL/min/{1.73_m2} — ABNORMAL LOW (ref >90)
Glucose: 94 mg/dl (ref 70–140)
POTASSIUM: 4 meq/L (ref 3.5–5.1)
Sodium: 138 mEq/L (ref 136–145)
Total Bilirubin: 0.28 mg/dL (ref 0.20–1.20)
Total Protein: 7.6 g/dL (ref 6.4–8.3)

## 2016-02-21 LAB — CBC WITH DIFFERENTIAL/PLATELET
BASO%: 0.4 % (ref 0.0–2.0)
BASOS ABS: 0 10*3/uL (ref 0.0–0.1)
EOS%: 1.4 % (ref 0.0–7.0)
Eosinophils Absolute: 0.1 10*3/uL (ref 0.0–0.5)
HCT: 29.2 % — ABNORMAL LOW (ref 34.8–46.6)
HGB: 9.7 g/dL — ABNORMAL LOW (ref 11.6–15.9)
LYMPH#: 1.3 10*3/uL (ref 0.9–3.3)
LYMPH%: 25.6 % (ref 14.0–49.7)
MCH: 30.3 pg (ref 25.1–34.0)
MCHC: 33.1 g/dL (ref 31.5–36.0)
MCV: 91.5 fL (ref 79.5–101.0)
MONO#: 0.4 10*3/uL (ref 0.1–0.9)
MONO%: 8.7 % (ref 0.0–14.0)
NEUT#: 3.3 10*3/uL (ref 1.5–6.5)
NEUT%: 63.9 % (ref 38.4–76.8)
Platelets: 268 10*3/uL (ref 145–400)
RBC: 3.19 10*6/uL — AB (ref 3.70–5.45)
RDW: 13 % (ref 11.2–14.5)
WBC: 5.1 10*3/uL (ref 3.9–10.3)

## 2016-02-21 LAB — ECHOCARDIOGRAM COMPLETE: WEIGHTICAEL: 2132.8 [oz_av]

## 2016-02-21 MED ORDER — DIPHENHYDRAMINE HCL 25 MG PO CAPS
ORAL_CAPSULE | ORAL | Status: AC
  Filled 2016-02-21: qty 2

## 2016-02-21 MED ORDER — ACETAMINOPHEN 325 MG PO TABS
650.0000 mg | ORAL_TABLET | Freq: Once | ORAL | Status: AC
  Administered 2016-02-21: 650 mg via ORAL

## 2016-02-21 MED ORDER — SODIUM CHLORIDE 0.9 % IV SOLN
Freq: Once | INTRAVENOUS | Status: AC
  Administered 2016-02-21: 11:00:00 via INTRAVENOUS

## 2016-02-21 MED ORDER — HEPARIN SOD (PORK) LOCK FLUSH 100 UNIT/ML IV SOLN
500.0000 [IU] | Freq: Once | INTRAVENOUS | Status: AC | PRN
  Administered 2016-02-21: 500 [IU]
  Filled 2016-02-21: qty 5

## 2016-02-21 MED ORDER — ACETAMINOPHEN 325 MG PO TABS
ORAL_TABLET | ORAL | Status: AC
  Filled 2016-02-21: qty 2

## 2016-02-21 MED ORDER — TRASTUZUMAB CHEMO 150 MG IV SOLR
6.0000 mg/kg | Freq: Once | INTRAVENOUS | Status: AC
  Administered 2016-02-21: 357 mg via INTRAVENOUS
  Filled 2016-02-21: qty 17

## 2016-02-21 MED ORDER — DIPHENHYDRAMINE HCL 25 MG PO CAPS
50.0000 mg | ORAL_CAPSULE | Freq: Once | ORAL | Status: AC
  Administered 2016-02-21: 50 mg via ORAL

## 2016-02-21 MED ORDER — SODIUM CHLORIDE 0.9% FLUSH
10.0000 mL | INTRAVENOUS | Status: DC | PRN
  Administered 2016-02-21: 10 mL
  Filled 2016-02-21: qty 10

## 2016-02-21 NOTE — Progress Notes (Signed)
  Echocardiogram 2D Echocardiogram has been performed.  Diamond Nickel 02/21/2016, 2:51 PM

## 2016-02-21 NOTE — Patient Instructions (Signed)
[facility] Discharge Instructions for Patients Receiving Chemotherapy  Today you received the following chemotherapy agents:  Herceptin  To help prevent nausea and vomiting after your treatment, we encourage you to take your nausea medication as prescribed.   If you develop nausea and vomiting that is not controlled by your nausea medication, call the clinic.   BELOW ARE SYMPTOMS THAT SHOULD BE REPORTED IMMEDIATELY:  *FEVER GREATER THAN 100.5 F  *CHILLS WITH OR WITHOUT FEVER  NAUSEA AND VOMITING THAT IS NOT CONTROLLED WITH YOUR NAUSEA MEDICATION  *UNUSUAL SHORTNESS OF BREATH  *UNUSUAL BRUISING OR BLEEDING  TENDERNESS IN MOUTH AND THROAT WITH OR WITHOUT PRESENCE OF ULCERS  *URINARY PROBLEMS  *BOWEL PROBLEMS  UNUSUAL RASH Items with * indicate a potential emergency and should be followed up as soon as possible.  Feel free to call the clinic you have any questions or concerns. The clinic phone number is [phone].  Please show the CHEMO ALERT CARD at check-in to the Emergency Department and triage nurse.   

## 2016-02-21 NOTE — Progress Notes (Signed)
Patient ID: Heidi Walls, female   DOB: 04/02/1955, 61 y.o.   MRN: [id number] Oncologist: Dr. Lindi Adie  61 yo with history of HTN, GERD, and breast cancer presents for cardio-oncology clinic evaluation.  She had left mastectomy in 6/17.  ER+/PR+/HER2+ tumor.  She will have TCHP adjuvant chemo followed by Herceptin alone for total of 1 year Herceptin-based therapy, completing in 6/18.     She has no history of cardiac problems.  She is not a smoker.    She had a nonischemic Cardiolite in 10/17 due to atypical chest pain.  No chest pain since that time.   Generally doing well with no exertional dyspnea.  PMH: 1. HTN 2. GERD 3. Breast cancer: Left mastectomy in 6/17.  ER+/PR+/HER2+ tumor.  She will have TCHP adjuvant chemo followed by Herceptin alone for total of 1 year Herceptin-based therapy.   - Echo (7/17): EF 60-65%, lateral s' 12.4, GLS -23.7%, grade II diastolic dysfunction.  - Echo (10/17): EF 60-65%, lateral s' 10.1, GLS -23.4%, normal RV size and systolic function.  - Echo (2/18): EF 25-85%, normal diastolic function, unable to measure strain accurately due to breast implant, RV normal size/systolic function.  4. Chest pain: Cardiolite (10/17) with EF 61%, fixed apical defect likely attenuation, no ischemia.   Social History   Social History  . Marital status: Married    Spouse name: N/A  . Number of children: N/A  . Years of education: N/A   Occupational History  . Not on file.   Social History Main Topics  . Smoking status: Former Research scientist (life sciences)  . Smokeless tobacco: Never Used  . Alcohol use Yes     Comment: social  . Drug use: No  . Sexual activity: Not on file   Other Topics Concern  . Not on file   Social History Narrative  . No narrative on file   Family History  Problem Relation Age of Onset  . Heart failure Mother   . Diabetes Mother   . Hypertension Mother   . Hypertension Father   . Breast cancer Maternal Grandmother    ROS: All systems reviewed and  negative except as per HPI.   Current Outpatient Prescriptions  Medication Sig Dispense Refill  . alendronate (FOSAMAX) 70 MG tablet Take 1 tablet (70 mg total) by mouth once a week. Take with a full glass of water on an empty stomach. 12 tablet 3  . ALPRAZolam (XANAX) 0.25 MG tablet Take 1 tablet (0.25 mg total) by mouth 3 (three) times daily as needed. 90 tablet 2  . anastrozole (ARIMIDEX) 1 MG tablet Take 1 tablet (1 mg total) by mouth daily. 90 tablet 3  . Ascorbic Acid (VITAMIN C) 1000 MG tablet Take 1,000 mg by mouth daily.    . benzonatate (TESSALON) 200 MG capsule Take 1 capsule (200 mg total) by mouth 3 (three) times daily as needed for cough. 30 capsule 2  . bifidobacterium infantis (ALIGN) capsule Take 1 capsule by mouth daily. 60 capsule   . Biotin (BIOTIN 5000) 5 MG CAPS Take 5,000 mg by mouth daily.    . calcium carbonate (TUMS - DOSED IN MG ELEMENTAL CALCIUM) 500 MG chewable tablet Chew 3 tablets by mouth 2 (two) times daily.    . citalopram (CELEXA) 20 MG tablet Take 20 mg by mouth every morning.    . cyclobenzaprine (FLEXERIL) 10 MG tablet Take 1 tablet (10 mg total) by mouth 3 (three) times daily. 60 tablet 2  . esomeprazole (NEXIUM) 20  MG capsule Take 20 mg by mouth 2 (two) times daily before a meal.     . hydrochlorothiazide (HYDRODIURIL) 12.5 MG tablet Take 1 tablet (12.5 mg total) by mouth daily. 90 tablet 3  . lidocaine-prilocaine (EMLA) cream Apply to affected area once 30 g 3  . MELOXICAM PO Take 7.5 mg by mouth.     . Multiple Vitamin (MULTIVITAMIN) tablet Take 1 tablet by mouth daily.    . Omega-3 Fatty Acids (FISH OIL) 1200 MG CAPS Take 1,200 mg by mouth daily.    . prochlorperazine (COMPAZINE) 10 MG tablet Take 1 tablet (10 mg total) by mouth every 6 (six) hours as needed (Nausea or vomiting). 30 tablet 1  . valACYclovir (VALTREX) 1000 MG tablet Take 1 tablet (1,000 mg total) by mouth 2 (two) times daily. (Patient taking differently: Take 1,000 mg by mouth 2 (two)  times daily as needed. ) 20 tablet 6   No current facility-administered medications for this encounter.    BP (!) 147/61 (BP Location: Right Arm, Patient Position: Sitting, Cuff Size: Normal)   Pulse 82   Wt 134 lb (60.8 kg)   SpO2 98%   BMI 24.51 kg/m  General: NAD Neck: No JVD, no thyromegaly or thyroid nodule.  Lungs: Clear to auscultation bilaterally with normal respiratory effort. CV: Nondisplaced PMI.  Heart regular S1/S2, no S3/S4, no murmur.  No peripheral edema.  No carotid bruit.  Normal pedal pulses.  Abdomen: Soft, nontender, no hepatosplenomegaly, no distention.  Skin: Intact without lesions or rashes.  Neurologic: Alert and oriented x 3.  Psych: Normal affect. Extremities: No clubbing or cyanosis.  HEENT: Normal.   Assessment/Plan: 1. Breast cancer:  Echo from today was reviewed, EF is normal though unable to interpret strain due to interference from breast implant.   - She will return for echo and office visit in 3 months.  2. Chest pain: Nonischemic Cardiolite in 10/17.  No further chest pain.     Loralie Champagne 02/21/2016

## 2016-02-21 NOTE — Progress Notes (Signed)
Patient Care Team: Terald Sleeper, PA-C as PCP - General ([place]) Sylvan Cheese, NP as Nurse Practitioner (Hematology and Oncology)  DIAGNOSIS:  Encounter Diagnosis  Name Primary?  . Malignant neoplasm of upper-inner quadrant of left breast in female, estrogen receptor positive ([place])     SUMMARY OF ONCOLOGIC HISTORY:   Breast cancer of upper-inner quadrant of left female breast ([place])   05/26/2015 Initial Diagnosis    Screen detected left breast asymmetry (posterior 1.6 cm): Grade 2-3 IDC ER/PR positive HER-2 positive Ki-67 20% plus calcs (span 6.1 cm): High-grade DCIS with suspicious foci of invasion (4.2 cm apart)      06/24/2015 Surgery    Left simple mastectomy with reconstruction: IDC grade 3, 2.5 cm, with high-grade DCIS, ALH, LVI present, margins negative, 0/1 sentinel node negative, T2 N0 stage II a, ER 100%, PR 5%, HER-2 positive ratio 1.56 with average copy #8.25, Ki-67 20%      07/25/2015 - 11/03/2015 Chemotherapy    Adjuvant chemotherapy with TCH Perjeta 6 cycles followed by Herceptin maintenance for 1 year      11/28/2015 -  Anti-estrogen oral therapy    Adjuvant anastrozole 1 mg daily       CHIEF COMPLIANT: Follow-up on Herceptin and anastrozole  INTERVAL HISTORY: LASHEA GODA is a 61-year-old with above-mentioned history left breast cancer treated with mastectomy followed by adjuvant chemotherapy. She is currently on Herceptin maintenance. She is also on anastrozole. She is tolerating both of these treatment extremely well. She denies any new problems or concerns today. Neuropathy appears to be improving slowly. Energy levels have normalized. She was started on Fosamax for osteoporosis and she appears to be tolerating it well. She is slightly more anxious than usual. Her taste is unusual in that her taste changes almost every week. The foods she that she now before don't taste the same anymore she cannot eat meat. She can eat lots of fruits and  vegetables.  REVIEW OF SYSTEMS:   Constitutional: Denies fevers, chills or abnormal weight loss Eyes: Denies blurriness of vision Ears, nose, mouth, throat, and face: Denies mucositis or sore throat Respiratory: Denies cough, dyspnea or wheezes Cardiovascular: Denies palpitation, chest discomfort Gastrointestinal:  Denies nausea, heartburn or change in bowel habits Skin: Denies abnormal skin rashes Lymphatics: Denies new lymphadenopathy or easy bruising Neurological:Denies numbness, tingling or new weaknesses Behavioral/Psych: Anxiety  Extremities: No lower extremity edema Breast:  denies any pain or lumps or nodules in either breasts All other systems were reviewed with the patient and are negative.  I have reviewed the past medical history, past surgical history, social history and family history with the patient and they are unchanged from previous note.  ALLERGIES:  is allergic to codeine.  MEDICATIONS:  Current Outpatient Prescriptions  Medication Sig Dispense Refill  . alendronate (FOSAMAX) 70 MG tablet Take 1 tablet (70 mg total) by mouth once a week. Take with a full glass of water on an empty stomach. 12 tablet 3  . ALPRAZolam (XANAX) 0.25 MG tablet Take 1 tablet (0.25 mg total) by mouth 3 (three) times daily as needed. 90 tablet 2  . anastrozole (ARIMIDEX) 1 MG tablet Take 1 tablet (1 mg total) by mouth daily. 90 tablet 3  . Ascorbic Acid (VITAMIN C) 1000 MG tablet Take 1,000 mg by mouth daily.    . benzonatate (TESSALON) 200 MG capsule Take 1 capsule (200 mg total) by mouth 3 (three) times daily as needed for cough. 30 capsule 2  .  bifidobacterium infantis (ALIGN) capsule Take 1 capsule by mouth daily. 60 capsule   . Biotin (BIOTIN 5000) 5 MG CAPS Take 5,000 mg by mouth daily.    . calcium carbonate (TUMS - DOSED IN MG ELEMENTAL CALCIUM) 500 MG chewable tablet Chew 3 tablets by mouth 2 (two) times daily.    . citalopram (CELEXA) 20 MG tablet Take 20 mg by mouth every  morning.    . cyclobenzaprine (FLEXERIL) 10 MG tablet Take 1 tablet (10 mg total) by mouth 3 (three) times daily. 60 tablet 2  . esomeprazole (NEXIUM) 20 MG capsule Take 20 mg by mouth 2 (two) times daily before a meal.     . hydrochlorothiazide (HYDRODIURIL) 12.5 MG tablet Take 1 tablet (12.5 mg total) by mouth daily. 90 tablet 3  . KLOR-CON M20 20 MEQ tablet TAKE 1 TABLET (20 MEQ TOTAL) BY MOUTH 2 (TWO) TIMES DAILY. 60 tablet 2  . lidocaine-prilocaine (EMLA) cream Apply to affected area once 30 g 3  . MELOXICAM PO Take 7.5 mg by mouth.     . Multiple Vitamin (MULTIVITAMIN) tablet Take 1 tablet by mouth daily.    . Omega-3 Fatty Acids (FISH OIL) 1200 MG CAPS Take 1,200 mg by mouth daily.    . potassium chloride SA (K-DUR,KLOR-CON) 20 MEQ tablet Take 1 tablet (20 mEq total) by mouth daily. 60 tablet 2  . prochlorperazine (COMPAZINE) 10 MG tablet Take 1 tablet (10 mg total) by mouth every 6 (six) hours as needed (Nausea or vomiting). 30 tablet 1  . valACYclovir (VALTREX) 1000 MG tablet Take 1 tablet (1,000 mg total) by mouth 2 (two) times daily. (Patient taking differently: Take 1,000 mg by mouth 2 (two) times daily as needed. ) 20 tablet 6   No current facility-administered medications for this visit.     PHYSICAL EXAMINATION: ECOG PERFORMANCE STATUS: 1 - Symptomatic but completely ambulatory  Vitals:   02/21/16 0937  BP: 126/68  Pulse: 85  Resp: 17  Temp: 98.1 F (36.7 C)   Filed Weights   02/21/16 0937  Weight: 133 lb 4.8 oz (60.5 kg)    GENERAL:alert, no distress and comfortable SKIN: skin color, texture, turgor are normal, no rashes or significant lesions EYES: normal, Conjunctiva are pink and non-injected, sclera clear OROPHARYNX:no exudate, no erythema and lips, buccal mucosa, and tongue normal  NECK: supple, thyroid normal size, non-tender, without nodularity LYMPH:  no palpable lymphadenopathy in the cervical, axillary or inguinal LUNGS: clear to auscultation and  percussion with normal breathing effort HEART: regular rate & rhythm and no murmurs and no lower extremity edema ABDOMEN:abdomen soft, non-tender and normal bowel sounds MUSCULOSKELETAL:no cyanosis of digits and no clubbing  NEURO: alert & oriented x 3 with fluent speech, no focal motor/sensory deficits EXTREMITIES: No lower extremity edema   LABORATORY DATA:  I have reviewed the data as listed   Chemistry      Component Value Date/Time   NA 138 02/21/2016 0914   K 4.0 02/21/2016 0914   CO2 28 02/21/2016 0914   BUN 25.3 02/21/2016 0914   CREATININE 1.0 02/21/2016 0914      Component Value Date/Time   CALCIUM 10.0 02/21/2016 0914   ALKPHOS 88 02/21/2016 0914   AST 18 02/21/2016 0914   ALT 17 02/21/2016 0914   BILITOT 0.28 02/21/2016 0914       Lab Results  Component Value Date   WBC 5.1 02/21/2016   HGB 9.7 (L) 02/21/2016   HCT 29.2 (L) 02/21/2016   MCV  91.5 02/21/2016   PLT 268 02/21/2016   NEUTROABS 3.3 02/21/2016    ASSESSMENT & PLAN:  Breast cancer of upper-inner quadrant of left female breast ([place]) Left simple mastectomy with reconstruction 06/24/2015: IDC grade 3, 2.5 cm, with high-grade DCIS, ALH, LVI present, margins negative, 0/1 sentinel node negative, , ER 100%, PR 5%, HER-2 positive ratio 1.56 with average copy #8.25, Ki-67 20%  Pathologic stage:T2 N0 stage II a Treatment plan: 1. Adjuvant chemotherapy with TCHP X 6 completed 10/30/17followed by Herceptin maintenance for 1 year 2. Followed by adjuvant hormonal therapy with anastrozole started 11/28/2015 ---------------------------------------------------------------------------------------------------------- Current treatment: Maintenance Herceptin every 3 weeks until October 2018 Echocardiogram 07/27/2015 60-65% Anastrozole started 11/28/15  Anastrozole Toxicities: Denies any hot flashes or myalgias. Bone density test has been ordered. I encouraged her to continue with calcium and vitamin  D. Patient has a previous diagnosis of osteoporosis. She was taking Evista. I encouraged her to stop Evista and start Fosamax once a week.  Fosamax toxicities: Tolerating it well Anxiety: On antianxiety medications  Return to clinic in 6 weeks for maintenance Herceptin follow-up. She gets Herceptin every 3 weeks.   I spent 25 minutes talking to the patient of which more than half was spent in counseling and coordination of care.  No orders of the defined types were placed in this encounter.  The patient has a good understanding of the overall plan. she agrees with it. she will call with any problems that may develop before the next visit here.   Rulon Eisenmenger, MD 02/21/16

## 2016-02-21 NOTE — Patient Instructions (Signed)
Your physician recommends that you schedule a follow-up appointment in: 3 months with echocardiogram  

## 2016-02-21 NOTE — Assessment & Plan Note (Signed)
Left simple mastectomy with reconstruction 06/24/2015: IDC grade 3, 2.5 cm, with high-grade DCIS, ALH, LVI present, margins negative, 0/1 sentinel node negative, , ER 100%, PR 5%, HER-2 positive ratio 1.56 with average copy #8.25, Ki-67 20%  Pathologic stage:T2 N0 stage II a Treatment plan: 1. Adjuvant chemotherapy with TCHP X 6 completed 10/30/17followed by Herceptin maintenance for 1 year 2. Followed by adjuvant hormonal therapy with anastrozole started 11/28/2015 ---------------------------------------------------------------------------------------------------------- Current treatment: Maintenance Herceptin every 3 weeks until October 2018 Echocardiogram 07/27/2015 60-65% Anastrozole started 11/28/15  Anastrozole Toxicities: Denies any hot flashes or myalgias. Bone density test has been ordered. I encouraged her to continue with calcium and vitamin D. Patient has a previous diagnosis of osteoporosis. She was taking Evista. I encouraged her to stop Evista and start Fosamax once a week.  Fosamax toxicities:  Return to clinic in 6 weeks for maintenance Herceptin follow-up. She gets Herceptin every 3 weeks.

## 2016-03-13 ENCOUNTER — Ambulatory Visit (HOSPITAL_BASED_OUTPATIENT_CLINIC_OR_DEPARTMENT_OTHER): Payer: BLUE CROSS/BLUE SHIELD

## 2016-03-13 VITALS — BP 151/76 | HR 95 | Temp 98.7°F | Resp 17

## 2016-03-13 DIAGNOSIS — Z5112 Encounter for antineoplastic immunotherapy: Secondary | ICD-10-CM

## 2016-03-13 DIAGNOSIS — C50212 Malignant neoplasm of upper-inner quadrant of left female breast: Secondary | ICD-10-CM

## 2016-03-13 DIAGNOSIS — Z170 Estrogen receptor positive status [ER+]: Secondary | ICD-10-CM

## 2016-03-13 MED ORDER — HEPARIN SOD (PORK) LOCK FLUSH 100 UNIT/ML IV SOLN
500.0000 [IU] | Freq: Once | INTRAVENOUS | Status: AC | PRN
  Administered 2016-03-13: 500 [IU]
  Filled 2016-03-13: qty 5

## 2016-03-13 MED ORDER — SODIUM CHLORIDE 0.9 % IV SOLN
Freq: Once | INTRAVENOUS | Status: AC
  Administered 2016-03-13: 10:00:00 via INTRAVENOUS

## 2016-03-13 MED ORDER — DIPHENHYDRAMINE HCL 25 MG PO CAPS
ORAL_CAPSULE | ORAL | Status: AC
  Filled 2016-03-13: qty 2

## 2016-03-13 MED ORDER — DIPHENHYDRAMINE HCL 25 MG PO CAPS
50.0000 mg | ORAL_CAPSULE | Freq: Once | ORAL | Status: AC
  Administered 2016-03-13: 50 mg via ORAL

## 2016-03-13 MED ORDER — TRASTUZUMAB CHEMO 150 MG IV SOLR
6.0000 mg/kg | Freq: Once | INTRAVENOUS | Status: AC
  Administered 2016-03-13: 357 mg via INTRAVENOUS
  Filled 2016-03-13: qty 17

## 2016-03-13 MED ORDER — ACETAMINOPHEN 325 MG PO TABS
ORAL_TABLET | ORAL | Status: AC
  Filled 2016-03-13: qty 2

## 2016-03-13 MED ORDER — SODIUM CHLORIDE 0.9% FLUSH
10.0000 mL | INTRAVENOUS | Status: DC | PRN
  Administered 2016-03-13: 10 mL
  Filled 2016-03-13: qty 10

## 2016-03-13 MED ORDER — ACETAMINOPHEN 325 MG PO TABS
650.0000 mg | ORAL_TABLET | Freq: Once | ORAL | Status: AC
  Administered 2016-03-13: 650 mg via ORAL

## 2016-03-13 NOTE — Patient Instructions (Signed)
[facility] Discharge Instructions for Patients Receiving Chemotherapy  Today you received the following chemotherapy agents:  Herceptin  To help prevent nausea and vomiting after your treatment, we encourage you to take your nausea medication as prescribed.   If you develop nausea and vomiting that is not controlled by your nausea medication, call the clinic.   BELOW ARE SYMPTOMS THAT SHOULD BE REPORTED IMMEDIATELY:  *FEVER GREATER THAN 100.5 F  *CHILLS WITH OR WITHOUT FEVER  NAUSEA AND VOMITING THAT IS NOT CONTROLLED WITH YOUR NAUSEA MEDICATION  *UNUSUAL SHORTNESS OF BREATH  *UNUSUAL BRUISING OR BLEEDING  TENDERNESS IN MOUTH AND THROAT WITH OR WITHOUT PRESENCE OF ULCERS  *URINARY PROBLEMS  *BOWEL PROBLEMS  UNUSUAL RASH Items with * indicate a potential emergency and should be followed up as soon as possible.  Feel free to call the clinic you have any questions or concerns. The clinic phone number is [phone].  Please show the CHEMO ALERT CARD at check-in to the Emergency Department and triage nurse.   

## 2016-04-02 ENCOUNTER — Other Ambulatory Visit: Payer: Self-pay | Admitting: Emergency Medicine

## 2016-04-02 ENCOUNTER — Other Ambulatory Visit: Payer: Self-pay | Admitting: *Deleted

## 2016-04-02 DIAGNOSIS — C50212 Malignant neoplasm of upper-inner quadrant of left female breast: Secondary | ICD-10-CM

## 2016-04-02 DIAGNOSIS — F411 Generalized anxiety disorder: Secondary | ICD-10-CM

## 2016-04-02 MED ORDER — ALPRAZOLAM 0.25 MG PO TABS: 0.2500 mg | ORAL_TABLET | Freq: Three times a day (TID) | ORAL | 2 refills | Status: DC | PRN

## 2016-04-02 NOTE — Telephone Encounter (Signed)
Rx called in to pharmacy. 

## 2016-04-03 ENCOUNTER — Ambulatory Visit (HOSPITAL_BASED_OUTPATIENT_CLINIC_OR_DEPARTMENT_OTHER): Payer: BLUE CROSS/BLUE SHIELD | Admitting: Hematology and Oncology

## 2016-04-03 ENCOUNTER — Encounter: Payer: Self-pay | Admitting: *Deleted

## 2016-04-03 ENCOUNTER — Other Ambulatory Visit (HOSPITAL_BASED_OUTPATIENT_CLINIC_OR_DEPARTMENT_OTHER): Payer: BLUE CROSS/BLUE SHIELD

## 2016-04-03 ENCOUNTER — Encounter: Payer: Self-pay | Admitting: Hematology and Oncology

## 2016-04-03 ENCOUNTER — Ambulatory Visit (HOSPITAL_BASED_OUTPATIENT_CLINIC_OR_DEPARTMENT_OTHER): Payer: BLUE CROSS/BLUE SHIELD

## 2016-04-03 VITALS — BP 152/76 | HR 86

## 2016-04-03 DIAGNOSIS — Z170 Estrogen receptor positive status [ER+]: Secondary | ICD-10-CM

## 2016-04-03 DIAGNOSIS — C50212 Malignant neoplasm of upper-inner quadrant of left female breast: Secondary | ICD-10-CM

## 2016-04-03 DIAGNOSIS — Z5112 Encounter for antineoplastic immunotherapy: Secondary | ICD-10-CM

## 2016-04-03 LAB — COMPREHENSIVE METABOLIC PANEL
ALBUMIN: 4.3 g/dL (ref 3.5–5.0)
ALT: 21 U/L (ref 0–55)
ANION GAP: 10 meq/L (ref 3–11)
AST: 20 U/L (ref 5–34)
Alkaline Phosphatase: 85 U/L (ref 40–150)
BILIRUBIN TOTAL: 0.22 mg/dL (ref 0.20–1.20)
BUN: 18.4 mg/dL (ref 7.0–26.0)
CALCIUM: 10.5 mg/dL — AB (ref 8.4–10.4)
CO2: 29 meq/L (ref 22–29)
CREATININE: 1.1 mg/dL (ref 0.6–1.1)
Chloride: 99 mEq/L (ref 98–109)
EGFR: 56 mL/min/{1.73_m2} — AB (ref >90)
Glucose: 100 mg/dl (ref 70–140)
Potassium: 4.1 mEq/L (ref 3.5–5.1)
Sodium: 137 mEq/L (ref 136–145)
TOTAL PROTEIN: 7.9 g/dL (ref 6.4–8.3)

## 2016-04-03 LAB — CBC WITH DIFFERENTIAL/PLATELET
BASO%: 0.6 % (ref 0.0–2.0)
Basophils Absolute: 0 10*3/uL (ref 0.0–0.1)
EOS%: 2.1 % (ref 0.0–7.0)
Eosinophils Absolute: 0.1 10*3/uL (ref 0.0–0.5)
HCT: 31.7 % — ABNORMAL LOW (ref 34.8–46.6)
HEMOGLOBIN: 10.5 g/dL — AB (ref 11.6–15.9)
LYMPH%: 27.3 % (ref 14.0–49.7)
MCH: 29.6 pg (ref 25.1–34.0)
MCHC: 33.2 g/dL (ref 31.5–36.0)
MCV: 89.1 fL (ref 79.5–101.0)
MONO#: 0.4 10*3/uL (ref 0.1–0.9)
MONO%: 8.8 % (ref 0.0–14.0)
NEUT%: 61.2 % (ref 38.4–76.8)
NEUTROS ABS: 3.1 10*3/uL (ref 1.5–6.5)
PLATELETS: 305 10*3/uL (ref 145–400)
RBC: 3.55 10*6/uL — AB (ref 3.70–5.45)
RDW: 12.9 % (ref 11.2–14.5)
WBC: 5 10*3/uL (ref 3.9–10.3)
lymph#: 1.4 10*3/uL (ref 0.9–3.3)

## 2016-04-03 MED ORDER — ACETAMINOPHEN 325 MG PO TABS
ORAL_TABLET | ORAL | Status: AC
  Filled 2016-04-03: qty 2

## 2016-04-03 MED ORDER — DIPHENHYDRAMINE HCL 25 MG PO CAPS
50.0000 mg | ORAL_CAPSULE | Freq: Once | ORAL | Status: AC
  Administered 2016-04-03: 50 mg via ORAL

## 2016-04-03 MED ORDER — HEPARIN SOD (PORK) LOCK FLUSH 100 UNIT/ML IV SOLN
500.0000 [IU] | Freq: Once | INTRAVENOUS | Status: AC | PRN
  Administered 2016-04-03: 500 [IU]
  Filled 2016-04-03: qty 5

## 2016-04-03 MED ORDER — DIPHENHYDRAMINE HCL 25 MG PO CAPS
ORAL_CAPSULE | ORAL | Status: AC
  Filled 2016-04-03: qty 2

## 2016-04-03 MED ORDER — SODIUM CHLORIDE 0.9 % IV SOLN
Freq: Once | INTRAVENOUS | Status: AC
  Administered 2016-04-03: 11:00:00 via INTRAVENOUS

## 2016-04-03 MED ORDER — ACETAMINOPHEN 325 MG PO TABS
650.0000 mg | ORAL_TABLET | Freq: Once | ORAL | Status: AC
  Administered 2016-04-03: 650 mg via ORAL

## 2016-04-03 MED ORDER — SODIUM CHLORIDE 0.9% FLUSH
10.0000 mL | INTRAVENOUS | Status: DC | PRN
  Administered 2016-04-03: 10 mL
  Filled 2016-04-03: qty 10

## 2016-04-03 MED ORDER — TRASTUZUMAB CHEMO 150 MG IV SOLR
6.0000 mg/kg | Freq: Once | INTRAVENOUS | Status: AC
  Administered 2016-04-03: 357 mg via INTRAVENOUS
  Filled 2016-04-03: qty 17

## 2016-04-03 NOTE — Patient Instructions (Signed)
[facility] Discharge Instructions for Patients  Today you received the following: Herceptin   To help prevent nausea and vomiting after your treatment, we encourage you to take your nausea medication as directed.   If you develop nausea and vomiting that is not controlled by your nausea medication, call the clinic.   BELOW ARE SYMPTOMS THAT SHOULD BE REPORTED IMMEDIATELY:  *FEVER GREATER THAN 100.5 F  *CHILLS WITH OR WITHOUT FEVER  NAUSEA AND VOMITING THAT IS NOT CONTROLLED WITH YOUR NAUSEA MEDICATION  *UNUSUAL SHORTNESS OF BREATH  *UNUSUAL BRUISING OR BLEEDING  TENDERNESS IN MOUTH AND THROAT WITH OR WITHOUT PRESENCE OF ULCERS  *URINARY PROBLEMS  *BOWEL PROBLEMS  UNUSUAL RASH Items with * indicate a potential emergency and should be followed up as soon as possible.  Feel free to call the clinic you have any questions or concerns. The clinic phone number is [phone].  Please show the CHEMO ALERT CARD at check-in to the Emergency Department and triage nurse.   

## 2016-04-03 NOTE — Progress Notes (Signed)
Patient Care Team: Terald Sleeper, PA-C as PCP - General ([place]) Sylvan Cheese, NP as Nurse Practitioner (Hematology and Oncology)  DIAGNOSIS:  Encounter Diagnosis  Name Primary?  . Malignant neoplasm of upper-inner quadrant of left breast in female, estrogen receptor positive ([place])     SUMMARY OF ONCOLOGIC HISTORY:   Breast cancer of upper-inner quadrant of left female breast ([place])   05/26/2015 Initial Diagnosis    Screen detected left breast asymmetry (posterior 1.6 cm): Grade 2-3 IDC ER/PR positive HER-2 positive Ki-67 20% plus calcs (span 6.1 cm): High-grade DCIS with suspicious foci of invasion (4.2 cm apart)      06/24/2015 Surgery    Left simple mastectomy with reconstruction: IDC grade 3, 2.5 cm, with high-grade DCIS, ALH, LVI present, margins negative, 0/1 sentinel node negative, T2 N0 stage II a, ER 100%, PR 5%, HER-2 positive ratio 1.56 with average copy #8.25, Ki-67 20%      07/25/2015 - 11/03/2015 Chemotherapy    Adjuvant chemotherapy with TCH Perjeta 6 cycles followed by Herceptin maintenance for 1 year      11/28/2015 -  Anti-estrogen oral therapy    Adjuvant anastrozole 1 mg daily      CHIEF COMPLIANT: Follow-up on Herceptin and anastrozole  INTERVAL HISTORY: Heidi Walls is a 61 year old with above-mentioned history of breast cancer underwent mastectomy and adjuvant chemotherapy. She is currently on Herceptin maintenance and is also receiving adjuvant anastrozole therapy. She is tolerating both Herceptin anastrozole extremely well. Energy levels are back to normal. Taste and appetite are excellent.  REVIEW OF SYSTEMS:   Constitutional: Denies fevers, chills or abnormal weight loss Eyes: Denies blurriness of vision Ears, nose, mouth, throat, and face: Denies mucositis or sore throat Respiratory: Denies cough, dyspnea or wheezes Cardiovascular: Denies palpitation, chest discomfort Gastrointestinal:  Denies nausea, heartburn or change in  bowel habits Skin: Denies abnormal skin rashes Lymphatics: Denies new lymphadenopathy or easy bruising Neurological:Denies numbness, tingling or new weaknesses Behavioral/Psych: Mood is stable, no new changes  Extremities: No lower extremity edema Breast:  denies any pain or lumps or nodules in either breasts All other systems were reviewed with the patient and are negative.  I have reviewed the past medical history, past surgical history, social history and family history with the patient and they are unchanged from previous note.  ALLERGIES:  is allergic to codeine.  MEDICATIONS:  Current Outpatient Prescriptions  Medication Sig Dispense Refill  . alendronate (FOSAMAX) 70 MG tablet Take 1 tablet (70 mg total) by mouth once a week. Take with a full glass of water on an empty stomach. 12 tablet 3  . ALPRAZolam (XANAX) 0.25 MG tablet Take 1 tablet (0.25 mg total) by mouth 3 (three) times daily as needed. 90 tablet 2  . anastrozole (ARIMIDEX) 1 MG tablet Take 1 tablet (1 mg total) by mouth daily. 90 tablet 3  . Ascorbic Acid (VITAMIN C) 1000 MG tablet Take 1,000 mg by mouth daily.    . benzonatate (TESSALON) 200 MG capsule Take 1 capsule (200 mg total) by mouth 3 (three) times daily as needed for cough. 30 capsule 2  . bifidobacterium infantis (ALIGN) capsule Take 1 capsule by mouth daily. 60 capsule   . Biotin (BIOTIN 5000) 5 MG CAPS Take 5,000 mg by mouth daily.    . calcium carbonate (TUMS - DOSED IN MG ELEMENTAL CALCIUM) 500 MG chewable tablet Chew 3 tablets by mouth 2 (two) times daily.    . citalopram (CELEXA) 20 MG tablet  Take 20 mg by mouth every morning.    . cyclobenzaprine (FLEXERIL) 10 MG tablet Take 1 tablet (10 mg total) by mouth 3 (three) times daily. 60 tablet 2  . esomeprazole (NEXIUM) 20 MG capsule Take 20 mg by mouth 2 (two) times daily before a meal.     . hydrochlorothiazide (HYDRODIURIL) 12.5 MG tablet Take 1 tablet (12.5 mg total) by mouth daily. 90 tablet 3  .  lidocaine-prilocaine (EMLA) cream Apply to affected area once 30 g 3  . MELOXICAM PO Take 7.5 mg by mouth.     . Multiple Vitamin (MULTIVITAMIN) tablet Take 1 tablet by mouth daily.    . Omega-3 Fatty Acids (FISH OIL) 1200 MG CAPS Take 1,200 mg by mouth daily.    . prochlorperazine (COMPAZINE) 10 MG tablet Take 1 tablet (10 mg total) by mouth every 6 (six) hours as needed (Nausea or vomiting). 30 tablet 1  . valACYclovir (VALTREX) 1000 MG tablet Take 1 tablet (1,000 mg total) by mouth 2 (two) times daily. (Patient taking differently: Take 1,000 mg by mouth 2 (two) times daily as needed. ) 20 tablet 6   No current facility-administered medications for this visit.     PHYSICAL EXAMINATION: ECOG PERFORMANCE STATUS: 0 - Asymptomatic  Vitals:   04/03/16 1009  BP: (!) 167/71  Pulse: 88  Resp: 18  Temp: 97.5 F (36.4 C)   Filed Weights   04/03/16 1009  Weight: 137 lb 8 oz (62.4 kg)    GENERAL:alert, no distress and comfortable SKIN: skin color, texture, turgor are normal, no rashes or significant lesions EYES: normal, Conjunctiva are pink and non-injected, sclera clear OROPHARYNX:no exudate, no erythema and lips, buccal mucosa, and tongue normal  NECK: supple, thyroid normal size, non-tender, without nodularity LYMPH:  no palpable lymphadenopathy in the cervical, axillary or inguinal LUNGS: clear to auscultation and percussion with normal breathing effort HEART: regular rate & rhythm and no murmurs and no lower extremity edema ABDOMEN:abdomen soft, non-tender and normal bowel sounds MUSCULOSKELETAL:no cyanosis of digits and no clubbing  NEURO: alert & oriented x 3 with fluent speech, no focal motor/sensory deficits EXTREMITIES: No lower extremity edema  LABORATORY DATA:  I have reviewed the data as listed   Chemistry      Component Value Date/Time   NA 137 04/03/2016 0938   K 4.1 04/03/2016 0938   CO2 29 04/03/2016 0938   BUN 18.4 04/03/2016 0938   CREATININE 1.1 04/03/2016  0938      Component Value Date/Time   CALCIUM 10.5 (H) 04/03/2016 0938   ALKPHOS 85 04/03/2016 0938   AST 20 04/03/2016 0938   ALT 21 04/03/2016 0938   BILITOT 0.22 04/03/2016 0938       Lab Results  Component Value Date   WBC 5.0 04/03/2016   HGB 10.5 (L) 04/03/2016   HCT 31.7 (L) 04/03/2016   MCV 89.1 04/03/2016   PLT 305 04/03/2016   NEUTROABS 3.1 04/03/2016    ASSESSMENT & PLAN:  Breast cancer of upper-inner quadrant of left female breast ([place]) Left simple mastectomy with reconstruction 06/24/2015: IDC grade 3, 2.5 cm, with high-grade DCIS, ALH, LVI present, margins negative, 0/1 sentinel node negative, , ER 100%, PR 5%, HER-2 positive ratio 1.56 with average copy #8.25, Ki-67 20%  Pathologic stage:T2 N0 stage II a Treatment plan: 1. Adjuvant chemotherapy with TCHP X 6 completed 10/30/17followed by Herceptin maintenance for 1 year 2. Followed by adjuvant hormonal therapy with anastrozole started 11/28/2015 ---------------------------------------------------------------------------------------------------------- Current treatment: Maintenance Herceptin every  3 weeks until October 2018 Echocardiogram 07/27/2015 60-65% Anastrozole started 11/28/15  Anastrozole Toxicities: Denies any hot flashes or myalgias. Bone density test has been ordered. I encouraged her to continue with calcium and vitamin D. Patient has a previous diagnosis of osteoporosis. She was taking Evista.  Fosamax toxicities: Tolerating it well Anxiety: On antianxiety medications  Return to clinic in 6 weeks for maintenance Herceptin follow-up. She gets Herceptin every 3 weeks.   I spent 25 minutes talking to the patient of which more than half was spent in counseling and coordination of care.  No orders of the defined types were placed in this encounter.  The patient has a good understanding of the overall plan. she agrees with it. she will call with any problems that may develop before the next  visit here.   Rulon Eisenmenger, MD 04/03/16

## 2016-04-03 NOTE — Assessment & Plan Note (Signed)
Left simple mastectomy with reconstruction 06/24/2015: IDC grade 3, 2.5 cm, with high-grade DCIS, ALH, LVI present, margins negative, 0/1 sentinel node negative, , ER 100%, PR 5%, HER-2 positive ratio 1.56 with average copy #8.25, Ki-67 20%  Pathologic stage:T2 N0 stage II a Treatment plan: 1. Adjuvant chemotherapy with TCHP X 6 completed 10/30/17followed by Herceptin maintenance for 1 year 2. Followed by adjuvant hormonal therapy with anastrozole started 11/28/2015 ---------------------------------------------------------------------------------------------------------- Current treatment: Maintenance Herceptin every 3 weeks until October 2018 Echocardiogram 07/27/2015 60-65% Anastrozole started 11/28/15  Anastrozole Toxicities: Denies any hot flashes or myalgias. Bone density test has been ordered. I encouraged her to continue with calcium and vitamin D. Patient has a previous diagnosis of osteoporosis. She was taking Evista.  Fosamax toxicities: Tolerating it well Anxiety: On antianxiety medications  Return to clinic in 6 weeks for maintenance Herceptin follow-up. She gets Herceptin every 3 weeks. 

## 2016-04-12 ENCOUNTER — Ambulatory Visit
Admission: RE | Admit: 2016-04-12 | Discharge: 2016-04-12 | Disposition: A | Discharge status: Home / Self Care | Payer: BLUE CROSS/BLUE SHIELD | Source: Ambulatory Visit | Attending: Hematology and Oncology | Admitting: Hematology and Oncology

## 2016-04-12 DIAGNOSIS — Z780 Asymptomatic menopausal state: Secondary | ICD-10-CM | POA: Diagnosis not present

## 2016-04-12 DIAGNOSIS — M810 Age-related osteoporosis without current pathological fracture: Secondary | ICD-10-CM

## 2016-04-12 DIAGNOSIS — M8589 Other specified disorders of bone density and structure, multiple sites: Secondary | ICD-10-CM | POA: Diagnosis not present

## 2016-04-15 ENCOUNTER — Other Ambulatory Visit: Payer: Self-pay | Admitting: Physician Assistant

## 2016-04-24 ENCOUNTER — Ambulatory Visit (HOSPITAL_BASED_OUTPATIENT_CLINIC_OR_DEPARTMENT_OTHER): Payer: BLUE CROSS/BLUE SHIELD

## 2016-04-24 VITALS — BP 145/53 | HR 85 | Temp 98.5°F | Resp 18

## 2016-04-24 DIAGNOSIS — Z5112 Encounter for antineoplastic immunotherapy: Secondary | ICD-10-CM | POA: Diagnosis not present

## 2016-04-24 DIAGNOSIS — C50212 Malignant neoplasm of upper-inner quadrant of left female breast: Secondary | ICD-10-CM

## 2016-04-24 DIAGNOSIS — Z170 Estrogen receptor positive status [ER+]: Secondary | ICD-10-CM

## 2016-04-24 MED ORDER — TRASTUZUMAB CHEMO 150 MG IV SOLR
6.0000 mg/kg | Freq: Once | INTRAVENOUS | Status: AC
  Administered 2016-04-24: 357 mg via INTRAVENOUS
  Filled 2016-04-24: qty 17

## 2016-04-24 MED ORDER — HEPARIN SOD (PORK) LOCK FLUSH 100 UNIT/ML IV SOLN
500.0000 [IU] | Freq: Once | INTRAVENOUS | Status: AC | PRN
  Administered 2016-04-24: 500 [IU]
  Filled 2016-04-24: qty 5

## 2016-04-24 MED ORDER — DIPHENHYDRAMINE HCL 25 MG PO CAPS
ORAL_CAPSULE | ORAL | Status: AC
  Filled 2016-04-24: qty 2

## 2016-04-24 MED ORDER — ACETAMINOPHEN 325 MG PO TABS
650.0000 mg | ORAL_TABLET | Freq: Once | ORAL | Status: AC
  Administered 2016-04-24: 650 mg via ORAL

## 2016-04-24 MED ORDER — SODIUM CHLORIDE 0.9 % IV SOLN
Freq: Once | INTRAVENOUS | Status: AC
  Administered 2016-04-24: 09:00:00 via INTRAVENOUS

## 2016-04-24 MED ORDER — SODIUM CHLORIDE 0.9% FLUSH
10.0000 mL | INTRAVENOUS | Status: DC | PRN
  Administered 2016-04-24: 10 mL
  Filled 2016-04-24: qty 10

## 2016-04-24 MED ORDER — ACETAMINOPHEN 325 MG PO TABS
ORAL_TABLET | ORAL | Status: AC
  Filled 2016-04-24: qty 2

## 2016-04-24 MED ORDER — DIPHENHYDRAMINE HCL 25 MG PO CAPS
50.0000 mg | ORAL_CAPSULE | Freq: Once | ORAL | Status: AC
  Administered 2016-04-24: 50 mg via ORAL

## 2016-04-24 NOTE — Patient Instructions (Signed)
[facility] Discharge Instructions for Patients Receiving Chemotherapy  Today you received the following chemotherapy agents: Herceptin   To help prevent nausea and vomiting after your treatment, we encourage you to take your nausea medication as directed.    If you develop nausea and vomiting that is not controlled by your nausea medication, call the clinic.   BELOW ARE SYMPTOMS THAT SHOULD BE REPORTED IMMEDIATELY:  *FEVER GREATER THAN 100.5 F  *CHILLS WITH OR WITHOUT FEVER  NAUSEA AND VOMITING THAT IS NOT CONTROLLED WITH YOUR NAUSEA MEDICATION  *UNUSUAL SHORTNESS OF BREATH  *UNUSUAL BRUISING OR BLEEDING  TENDERNESS IN MOUTH AND THROAT WITH OR WITHOUT PRESENCE OF ULCERS  *URINARY PROBLEMS  *BOWEL PROBLEMS  UNUSUAL RASH Items with * indicate a potential emergency and should be followed up as soon as possible.  Feel free to call the clinic you have any questions or concerns. The clinic phone number is [phone].  Please show the CHEMO ALERT CARD at check-in to the Emergency Department and triage nurse.   

## 2016-05-01 ENCOUNTER — Telehealth: Payer: Self-pay | Admitting: Physician Assistant

## 2016-05-01 ENCOUNTER — Other Ambulatory Visit: Payer: Self-pay | Admitting: Physician Assistant

## 2016-05-01 MED ORDER — AZITHROMYCIN 250 MG PO TABS: ORAL_TABLET | ORAL | 0 refills | Status: DC

## 2016-05-01 MED ORDER — MELOXICAM 7.5 MG PO TABS: 7.5000 mg | ORAL_TABLET | Freq: Every day | ORAL | 5 refills | Status: DC

## 2016-05-01 MED ORDER — PREDNISONE 10 MG (21) PO TBPK: ORAL_TABLET | ORAL | 0 refills | Status: DC

## 2016-05-01 NOTE — Telephone Encounter (Signed)
Patient called stating that she has nasal congestion, cough, and has lost her voice.  Patient is a chemo patient taking herceptin and arimidex. She has also been taking zyrtec in the morning and 2 benadryl's nightly.   Patient has had no relief from these medications.  Patient will also call Oncologist

## 2016-05-01 NOTE — Addendum Note (Signed)
Addended by: Terald Sleeper on: 05/01/2016 09:12 AM   Modules accepted: Orders

## 2016-05-01 NOTE — Telephone Encounter (Signed)
Patient aware that Zpack and prednisone has been sent to pharmacy

## 2016-05-08 ENCOUNTER — Other Ambulatory Visit: Payer: Self-pay | Admitting: Physician Assistant

## 2016-05-08 MED ORDER — MAGIC MOUTHWASH: 5.0000 mL | Freq: Four times a day (QID) | ORAL | 0 refills | Status: DC

## 2016-05-08 MED ORDER — NYSTATIN 100000 UNIT/ML MT SUSP: 5.0000 mL | Freq: Four times a day (QID) | OROMUCOSAL | 0 refills | Status: DC

## 2016-05-14 ENCOUNTER — Other Ambulatory Visit: Payer: Self-pay

## 2016-05-14 DIAGNOSIS — Z170 Estrogen receptor positive status [ER+]: Secondary | ICD-10-CM

## 2016-05-14 DIAGNOSIS — C50212 Malignant neoplasm of upper-inner quadrant of left female breast: Secondary | ICD-10-CM

## 2016-05-15 ENCOUNTER — Encounter: Payer: Self-pay | Admitting: Hematology and Oncology

## 2016-05-15 ENCOUNTER — Other Ambulatory Visit (HOSPITAL_BASED_OUTPATIENT_CLINIC_OR_DEPARTMENT_OTHER): Payer: BLUE CROSS/BLUE SHIELD

## 2016-05-15 ENCOUNTER — Ambulatory Visit (HOSPITAL_BASED_OUTPATIENT_CLINIC_OR_DEPARTMENT_OTHER): Payer: BLUE CROSS/BLUE SHIELD

## 2016-05-15 ENCOUNTER — Ambulatory Visit (HOSPITAL_BASED_OUTPATIENT_CLINIC_OR_DEPARTMENT_OTHER): Payer: BLUE CROSS/BLUE SHIELD | Admitting: Hematology and Oncology

## 2016-05-15 VITALS — BP 138/58 | HR 90 | Temp 98.2°F | Resp 18 | Ht 62.0 in | Wt 145.0 lb

## 2016-05-15 DIAGNOSIS — C50212 Malignant neoplasm of upper-inner quadrant of left female breast: Secondary | ICD-10-CM | POA: Diagnosis not present

## 2016-05-15 DIAGNOSIS — Z5112 Encounter for antineoplastic immunotherapy: Secondary | ICD-10-CM | POA: Diagnosis not present

## 2016-05-15 DIAGNOSIS — M8580 Other specified disorders of bone density and structure, unspecified site: Secondary | ICD-10-CM

## 2016-05-15 DIAGNOSIS — Z170 Estrogen receptor positive status [ER+]: Secondary | ICD-10-CM

## 2016-05-15 DIAGNOSIS — D649 Anemia, unspecified: Secondary | ICD-10-CM

## 2016-05-15 DIAGNOSIS — R490 Dysphonia: Secondary | ICD-10-CM

## 2016-05-15 LAB — CBC WITH DIFFERENTIAL/PLATELET
BASO%: 0.4 % (ref 0.0–2.0)
BASOS ABS: 0 10*3/uL (ref 0.0–0.1)
EOS ABS: 0.1 10*3/uL (ref 0.0–0.5)
EOS%: 1.6 % (ref 0.0–7.0)
HEMATOCRIT: 29 % — AB (ref 34.8–46.6)
HGB: 9.6 g/dL — ABNORMAL LOW (ref 11.6–15.9)
LYMPH%: 24.9 % (ref 14.0–49.7)
MCH: 28.9 pg (ref 25.1–34.0)
MCHC: 33.2 g/dL (ref 31.5–36.0)
MCV: 87.2 fL (ref 79.5–101.0)
MONO#: 0.5 10*3/uL (ref 0.1–0.9)
MONO%: 8.4 % (ref 0.0–14.0)
NEUT#: 4 10*3/uL (ref 1.5–6.5)
NEUT%: 64.7 % (ref 38.4–76.8)
PLATELETS: 276 10*3/uL (ref 145–400)
RBC: 3.32 10*6/uL — AB (ref 3.70–5.45)
RDW: 13.1 % (ref 11.2–14.5)
WBC: 6.1 10*3/uL (ref 3.9–10.3)
lymph#: 1.5 10*3/uL (ref 0.9–3.3)

## 2016-05-15 LAB — COMPREHENSIVE METABOLIC PANEL
ALT: 22 U/L (ref 0–55)
ANION GAP: 8 meq/L (ref 3–11)
AST: 17 U/L (ref 5–34)
Albumin: 4.1 g/dL (ref 3.5–5.0)
Alkaline Phosphatase: 72 U/L (ref 40–150)
BUN: 24.8 mg/dL (ref 7.0–26.0)
CALCIUM: 9.6 mg/dL (ref 8.4–10.4)
CHLORIDE: 101 meq/L (ref 98–109)
CO2: 25 meq/L (ref 22–29)
Creatinine: 1.1 mg/dL (ref 0.6–1.1)
EGFR: 54 mL/min/{1.73_m2} — AB (ref >90)
Glucose: 106 mg/dl (ref 70–140)
POTASSIUM: 4.8 meq/L (ref 3.5–5.1)
Sodium: 134 mEq/L — ABNORMAL LOW (ref 136–145)
Total Bilirubin: <0.22 mg/dL (ref 0.20–1.20)
Total Protein: 7.3 g/dL (ref 6.4–8.3)

## 2016-05-15 MED ORDER — SODIUM CHLORIDE 0.9% FLUSH
10.0000 mL | INTRAVENOUS | Status: DC | PRN
  Administered 2016-05-15: 10 mL
  Filled 2016-05-15: qty 10

## 2016-05-15 MED ORDER — ACETAMINOPHEN 325 MG PO TABS
650.0000 mg | ORAL_TABLET | Freq: Once | ORAL | Status: AC
  Administered 2016-05-15: 650 mg via ORAL

## 2016-05-15 MED ORDER — ACETAMINOPHEN 325 MG PO TABS
ORAL_TABLET | ORAL | Status: AC
  Filled 2016-05-15: qty 2

## 2016-05-15 MED ORDER — TRASTUZUMAB CHEMO 150 MG IV SOLR
6.0000 mg/kg | Freq: Once | INTRAVENOUS | Status: AC
  Administered 2016-05-15: 357 mg via INTRAVENOUS
  Filled 2016-05-15: qty 17

## 2016-05-15 MED ORDER — HEPARIN SOD (PORK) LOCK FLUSH 100 UNIT/ML IV SOLN
500.0000 [IU] | Freq: Once | INTRAVENOUS | Status: AC | PRN
  Administered 2016-05-15: 500 [IU]
  Filled 2016-05-15: qty 5

## 2016-05-15 MED ORDER — DIPHENHYDRAMINE HCL 25 MG PO CAPS
ORAL_CAPSULE | ORAL | Status: AC
Start: 2016-05-15 — End: 2016-05-15
  Filled 2016-05-15: qty 2

## 2016-05-15 MED ORDER — DIPHENHYDRAMINE HCL 25 MG PO CAPS
50.0000 mg | ORAL_CAPSULE | Freq: Once | ORAL | Status: AC
  Administered 2016-05-15: 50 mg via ORAL

## 2016-05-15 MED ORDER — SODIUM CHLORIDE 0.9 % IV SOLN
Freq: Once | INTRAVENOUS | Status: AC
  Administered 2016-05-15: 11:00:00 via INTRAVENOUS

## 2016-05-15 NOTE — Progress Notes (Signed)
Patient Care Team: Theodoro Clock as PCP - General (General Practice) Sylvan Cheese, NP as Nurse Practitioner (Hematology and Oncology)  DIAGNOSIS:  Encounter Diagnoses  Name Primary?  . Malignant neoplasm of upper-inner quadrant of left breast in female, estrogen receptor positive ([place])   . Hoarseness of voice Yes    SUMMARY OF ONCOLOGIC HISTORY:   Breast cancer of upper-inner quadrant of left female breast (Heidi Walls)   05/26/2015 Initial Diagnosis    Screen detected left breast asymmetry (posterior 1.6 cm): Grade 2-3 IDC ER/PR positive HER-2 positive Ki-67 20% plus calcs (span 6.1 cm): High-grade DCIS with suspicious foci of invasion (4.2 cm apart)      06/24/2015 Surgery    Left simple mastectomy with reconstruction: IDC grade 3, 2.5 cm, with high-grade DCIS, ALH, LVI present, margins negative, 0/1 sentinel node negative, T2 N0 stage II a, ER 100%, PR 5%, HER-2 positive ratio 1.56 with average copy #8.25, Ki-67 20%      07/25/2015 - 11/03/2015 Chemotherapy    Adjuvant chemotherapy with TCH Perjeta 6 cycles followed by Herceptin maintenance for 1 year      11/28/2015 -  Anti-estrogen oral therapy    Adjuvant anastrozole 1 mg daily      CHIEF COMPLIANT: Herceptin maintenance, complaining of severe hoarseness of voice  INTERVAL HISTORY: Heidi Walls is a 61 year old with above-mentioned history of left breast cancer treated with mastectomy and adjuvant chemotherapy with TCH Perjeta. She is currently on Herceptin maintenance. She is tolerating Herceptin maintenance extremely well without any major problems. For the past 4 weeks she's been experiencing severe throat symptoms related to allergies. Her primary care physician and treated her with azithromycin as well as steroids Benadryl and loratadine. In spite of all these measures she continues to have hoarseness of voice and cough.  REVIEW OF SYSTEMS:   Constitutional: Denies fevers, chills or abnormal weight  loss Eyes: Denies blurriness of vision Ears, nose, mouth, throat, and face: Hoarseness of voice Respiratory: Hoarseness of voice and cough Cardiovascular: Denies palpitation, chest discomfort Gastrointestinal:  Denies nausea, heartburn or change in bowel habits Skin: Denies abnormal skin rashes Lymphatics: Denies new lymphadenopathy or easy bruising Neurological:Denies numbness, tingling or new weaknesses Behavioral/Psych: Mood is stable, no new changes  Extremities: No lower extremity edema Breast:  denies any pain or lumps or nodules in either breasts All other systems were reviewed with the patient and are negative.  I have reviewed the past medical history, past surgical history, social history and family history with the patient and they are unchanged from previous note.  ALLERGIES:  is allergic to codeine.  MEDICATIONS:  Current Outpatient Prescriptions  Medication Sig Dispense Refill  . alendronate (FOSAMAX) 70 MG tablet Take 1 tablet (70 mg total) by mouth once a week. Take with a full glass of water on an empty stomach. 12 tablet 3  . ALPRAZolam (XANAX) 0.25 MG tablet Take 1 tablet (0.25 mg total) by mouth 3 (three) times daily as needed. 90 tablet 2  . anastrozole (ARIMIDEX) 1 MG tablet Take 1 tablet (1 mg total) by mouth daily. 90 tablet 3  . Ascorbic Acid (VITAMIN C) 1000 MG tablet Take 1,000 mg by mouth daily.    . benzonatate (TESSALON) 200 MG capsule Take 1 capsule (200 mg total) by mouth 3 (three) times daily as needed for cough. 30 capsule 2  . bifidobacterium infantis (ALIGN) capsule Take 1 capsule by mouth daily. 60 capsule   . Biotin (BIOTIN 5000) 5 MG  CAPS Take 5,000 mg by mouth daily.    . calcium carbonate (TUMS - DOSED IN MG ELEMENTAL CALCIUM) 500 MG chewable tablet Chew 3 tablets by mouth 2 (two) times daily.    . citalopram (CELEXA) 20 MG tablet Take 20 mg by mouth every morning.    . cyclobenzaprine (FLEXERIL) 10 MG tablet TAKE 1 TABLET (10 MG TOTAL) BY MOUTH  3 (THREE) TIMES DAILY. 60 tablet 2  . esomeprazole (NEXIUM) 20 MG capsule Take 20 mg by mouth 2 (two) times daily before a meal.     . hydrochlorothiazide (HYDRODIURIL) 12.5 MG tablet Take 1 tablet (12.5 mg total) by mouth daily. 90 tablet 3  . lidocaine-prilocaine (EMLA) cream Apply to affected area once 30 g 3  . magic mouthwash SOLN Take 5 mLs by mouth 4 (four) times daily. 240 mL 0  . meloxicam (MOBIC) 7.5 MG tablet Take 1 tablet (7.5 mg total) by mouth daily. 30 tablet 5  . Multiple Vitamin (MULTIVITAMIN) tablet Take 1 tablet by mouth daily.    Marland Kitchen nystatin (MYCOSTATIN) 100000 UNIT/ML suspension Take 5 mLs (500,000 Units total) by mouth 4 (four) times daily. 240 mL 0  . Omega-3 Fatty Acids (FISH OIL) 1200 MG CAPS Take 1,200 mg by mouth daily.    . prochlorperazine (COMPAZINE) 10 MG tablet Take 1 tablet (10 mg total) by mouth every 6 (six) hours as needed (Nausea or vomiting). 30 tablet 1  . valACYclovir (VALTREX) 1000 MG tablet Take 1 tablet (1,000 mg total) by mouth 2 (two) times daily. (Patient taking differently: Take 1,000 mg by mouth 2 (two) times daily as needed. ) 20 tablet 6   No current facility-administered medications for this visit.    Facility-Administered Medications Ordered in Other Visits  Medication Dose Route Frequency Provider Last Rate Last Dose  . 0.9 %  sodium chloride infusion   Intravenous Once Nicholas Lose, MD      . acetaminophen (TYLENOL) tablet 650 mg  650 mg Oral Once Nicholas Lose, MD      . diphenhydrAMINE (BENADRYL) capsule 50 mg  50 mg Oral Once Nicholas Lose, MD      . heparin lock flush 100 unit/mL  500 Units Intracatheter Once PRN Nicholas Lose, MD      . sodium chloride flush (NS) 0.9 % injection 10 mL  10 mL Intracatheter PRN Nicholas Lose, MD      . trastuzumab (HERCEPTIN) 357 mg in sodium chloride 0.9 % 250 mL chemo infusion  6 mg/kg (Treatment Plan Recorded) Intravenous Once Nicholas Lose, MD        PHYSICAL EXAMINATION: ECOG PERFORMANCE STATUS: 1  - Symptomatic but completely ambulatory  Vitals:   05/15/16 0956  BP: (!) 138/58  Pulse: 90  Resp: 18  Temp: 98.2 F (36.8 C)   Filed Weights   05/15/16 0956  Weight: 145 lb (65.8 kg)    GENERAL:alert, no distress and comfortable SKIN: skin color, texture, turgor are normal, no rashes or significant lesions EYES: normal, Conjunctiva are pink and non-injected, sclera clear OROPHARYNX:no exudate, no erythema and lips, buccal mucosa, and tongue normal  NECK: supple, thyroid normal size, non-tender, without nodularity LYMPH:  no palpable lymphadenopathy in the cervical, axillary or inguinal LUNGS: clear to auscultation and percussion with normal breathing effort HEART: regular rate & rhythm and no murmurs and no lower extremity edema ABDOMEN:abdomen soft, non-tender and normal bowel sounds MUSCULOSKELETAL:no cyanosis of digits and no clubbing  NEURO: alert & oriented x 3 with fluent speech, no focal  motor/sensory deficits EXTREMITIES: No lower extremity edema BREAST: No palpable masses or nodules in either right or left breasts. No palpable axillary supraclavicular or infraclavicular adenopathy no breast tenderness or nipple discharge. (exam performed in the presence of a chaperone)  LABORATORY DATA:  I have reviewed the data as listed   Chemistry      Component Value Date/Time   NA 134 (L) 05/15/2016 0944   K 4.8 05/15/2016 0944   CO2 25 05/15/2016 0944   BUN 24.8 05/15/2016 0944   CREATININE 1.1 05/15/2016 0944      Component Value Date/Time   CALCIUM 9.6 05/15/2016 0944   ALKPHOS 72 05/15/2016 0944   AST 17 05/15/2016 0944   ALT 22 05/15/2016 0944   BILITOT <0.22 05/15/2016 0944       Lab Results  Component Value Date   WBC 6.1 05/15/2016   HGB 9.6 (L) 05/15/2016   HCT 29.0 (L) 05/15/2016   MCV 87.2 05/15/2016   PLT 276 05/15/2016   NEUTROABS 4.0 05/15/2016    ASSESSMENT & PLAN:  Breast cancer of upper-inner quadrant of left female breast ([place]) Left simple  mastectomy with reconstruction 06/24/2015: IDC grade 3, 2.5 cm, with high-grade DCIS, ALH, LVI present, margins negative, 0/1 sentinel node negative, , ER 100%, PR 5%, HER-2 positive ratio 1.56 with average copy #8.25, Ki-67 20%  Pathologic stage:T2 N0 stage II a Treatment plan: 1. Adjuvant chemotherapy with TCHP X 6 completed 10/30/17followed by Herceptin maintenance for 1 year 2. Followed by adjuvant hormonal therapy with anastrozole started 11/28/2015 ---------------------------------------------------------------------------------------------------------- Current treatment: Maintenance Herceptin every 3 weeks until October 2018 Echocardiogram 07/27/2015 60-65% Anastrozole started 11/28/15  Anastrozole Toxicities: Denies any hot flashes However she does complain of joint and muscle aches for which she is taking Tylenol 4 tablets daily.. Bone density test 04/12/2016: T score -2.2. Osteopenia. She is taking Fosamax and calcium and vitamin D.  Fosamax toxicities:Tolerating it well Anxiety: On antianxiety medications Anemia: Hemoglobin is 9.6 today. Monitoring closely.  Hoarseness otherwise: She has tried many treatments so far. I recommended an ENT referral. I will send her to see Dr. Redmond Baseman. Return to clinic in 6 weeks for maintenance Herceptin follow-up. She gets Herceptin every 3 weeks.  I spent 25 minutes talking to the patient of which more than half was spent in counseling and coordination of care.  Orders Placed This Encounter  Procedures  . Ambulatory referral to ENT    Referral Priority:   Routine    Referral Type:   Consultation    Referral Reason:   Specialty Services Required    Referred to Provider:   Melida Quitter, MD    Requested Specialty:   Otolaryngology    Number of Visits Requested:   1   The patient has a good understanding of the overall plan. she agrees with it. she will call with any problems that may develop before the next visit here.   Rulon Eisenmenger, MD 05/15/16

## 2016-05-15 NOTE — Patient Instructions (Signed)
Cancer Center Discharge Instructions for Patients Receiving Chemotherapy  Today you received the following chemotherapy agents Herceptin  To help prevent nausea and vomiting after your treatment, we encourage you to take your nausea medication    If you develop nausea and vomiting that is not controlled by your nausea medication, call the clinic.   BELOW ARE SYMPTOMS THAT SHOULD BE REPORTED IMMEDIATELY:  *FEVER GREATER THAN 100.5 F  *CHILLS WITH OR WITHOUT FEVER  NAUSEA AND VOMITING THAT IS NOT CONTROLLED WITH YOUR NAUSEA MEDICATION  *UNUSUAL SHORTNESS OF BREATH  *UNUSUAL BRUISING OR BLEEDING  TENDERNESS IN MOUTH AND THROAT WITH OR WITHOUT PRESENCE OF ULCERS  *URINARY PROBLEMS  *BOWEL PROBLEMS  UNUSUAL RASH Items with * indicate a potential emergency and should be followed up as soon as possible.  Feel free to call the clinic you have any questions or concerns. The clinic phone number is [phone].  Please show the CHEMO ALERT CARD at check-in to the Emergency Department and triage nurse.   

## 2016-05-15 NOTE — Assessment & Plan Note (Signed)
Left simple mastectomy with reconstruction 06/24/2015: IDC grade 3, 2.5 cm, with high-grade DCIS, ALH, LVI present, margins negative, 0/1 sentinel node negative, , ER 100%, PR 5%, HER-2 positive ratio 1.56 with average copy #8.25, Ki-67 20%  Pathologic stage:T2 N0 stage II a Treatment plan: 1. Adjuvant chemotherapy with TCHP X 6 completed 10/30/17followed by Herceptin maintenance for 1 year 2. Followed by adjuvant hormonal therapy with anastrozole started 11/28/2015 ---------------------------------------------------------------------------------------------------------- Current treatment: Maintenance Herceptin every 3 weeks until October 2018 Echocardiogram 07/27/2015 60-65% Anastrozole started 11/28/15  Anastrozole Toxicities: Denies any hot flashes or myalgias. Bone density test 04/12/2016: T score -2.2. Osteopenia. She is taking Fosamax and calcium and vitamin D.  Fosamax toxicities:Tolerating it well Anxiety: On antianxiety medications  Return to clinic in 6 weeks for maintenance Herceptin follow-up. She gets Herceptin every 3 weeks.

## 2016-05-18 ENCOUNTER — Telehealth: Payer: Self-pay | Admitting: Hematology and Oncology

## 2016-05-18 NOTE — Telephone Encounter (Signed)
sw pt to confirm 6/6 appt at 140 at [facility] at [address] [phone]. Gave pt number to office to call and see if they have any cancellations to get an sooner appt

## 2016-05-21 ENCOUNTER — Telehealth: Payer: Self-pay | Admitting: Hematology and Oncology

## 2016-05-21 DIAGNOSIS — R05 Cough: Secondary | ICD-10-CM | POA: Diagnosis not present

## 2016-05-21 DIAGNOSIS — H1032 Unspecified acute conjunctivitis, left eye: Secondary | ICD-10-CM | POA: Diagnosis not present

## 2016-05-21 NOTE — Telephone Encounter (Signed)
Faxed records to Dr Redmond Baseman [phone]

## 2016-05-22 ENCOUNTER — Ambulatory Visit (HOSPITAL_BASED_OUTPATIENT_CLINIC_OR_DEPARTMENT_OTHER)
Admission: RE | Admit: 2016-05-22 | Discharge: 2016-05-22 | Disposition: A | Discharge status: Home / Self Care | Payer: BLUE CROSS/BLUE SHIELD | Source: Ambulatory Visit | Attending: Cardiology | Admitting: Cardiology

## 2016-05-22 ENCOUNTER — Ambulatory Visit (HOSPITAL_COMMUNITY)
Admission: RE | Admit: 2016-05-22 | Discharge: 2016-05-22 | Disposition: A | Discharge status: Home / Self Care | Payer: BLUE CROSS/BLUE SHIELD | Source: Ambulatory Visit | Attending: Physician Assistant | Admitting: Physician Assistant

## 2016-05-22 ENCOUNTER — Encounter (HOSPITAL_COMMUNITY): Payer: Self-pay

## 2016-05-22 VITALS — BP 145/62 | HR 81 | Wt 143.8 lb

## 2016-05-22 DIAGNOSIS — I10 Essential (primary) hypertension: Secondary | ICD-10-CM | POA: Diagnosis not present

## 2016-05-22 DIAGNOSIS — C50212 Malignant neoplasm of upper-inner quadrant of left female breast: Secondary | ICD-10-CM | POA: Diagnosis not present

## 2016-05-22 DIAGNOSIS — I340 Nonrheumatic mitral (valve) insufficiency: Secondary | ICD-10-CM | POA: Insufficient documentation

## 2016-05-22 NOTE — Progress Notes (Signed)
  Echocardiogram 2D Echocardiogram has been performed.  Heidi Walls 05/22/2016, 2:52 PM

## 2016-05-22 NOTE — Patient Instructions (Signed)
Your physician recommends that you schedule a follow-up appointment in: 3 month with echocardiogram

## 2016-05-24 NOTE — Progress Notes (Signed)
Patient ID: Heidi Walls, female   DOB: 10-27-1955, 61 y.o.   MRN: [id number] Oncologist: Dr. Lindi Adie  61 yo with history of HTN, GERD, and breast cancer presents for cardio-oncology clinic evaluation.  She had left mastectomy in 6/17.  ER+/PR+/HER2+ tumor.  She will have TCHP adjuvant chemo followed by Herceptin alone for total of 1 year Herceptin-based therapy, completing in 7/18.     She has no history of cardiac problems.  She is not a smoker.    She had a nonischemic Cardiolite in 10/17 due to atypical chest pain.  No chest pain since that time.   Generally doing well with no exertional dyspnea.  PMH: 1. HTN 2. GERD 3. Breast cancer: Left mastectomy in 6/17.  ER+/PR+/HER2+ tumor.  She will have TCHP adjuvant chemo followed by Herceptin alone for total of 1 year Herceptin-based therapy.   - Echo (7/17): EF 60-65%, lateral s' 12.4, GLS -23.7%, grade II diastolic dysfunction.  - Echo (10/17): EF 60-65%, lateral s' 10.1, GLS -23.4%, normal RV size and systolic function.  - Echo (2/18): EF 97-35%, normal diastolic function, unable to measure strain accurately due to breast implant, RV normal size/systolic function.  - Echo (5/18): EF 60-65%, GLS -23.6%.  4. Chest pain: Cardiolite (10/17) with EF 61%, fixed apical defect likely attenuation, no ischemia.   Social History   Social History  . Marital status: Married    Spouse name: N/A  . Number of children: N/A  . Years of education: N/A   Occupational History  . Not on file.   Social History Main Topics  . Smoking status: Former Research scientist (life sciences)  . Smokeless tobacco: Never Used  . Alcohol use Yes     Comment: social  . Drug use: No  . Sexual activity: Not on file   Other Topics Concern  . Not on file   Social History Narrative  . No narrative on file   Family History  Problem Relation Age of Onset  . Heart failure Mother   . Diabetes Mother   . Hypertension Mother   . Hypertension Father   . Breast cancer Maternal Grandmother     ROS: All systems reviewed and negative except as per HPI.   Current Outpatient Prescriptions  Medication Sig Dispense Refill  . alendronate (FOSAMAX) 70 MG tablet Take 1 tablet (70 mg total) by mouth once a week. Take with a full glass of water on an empty stomach. 12 tablet 3  . ALPRAZolam (XANAX) 0.25 MG tablet Take 1 tablet (0.25 mg total) by mouth 3 (three) times daily as needed. 90 tablet 2  . anastrozole (ARIMIDEX) 1 MG tablet Take 1 tablet (1 mg total) by mouth daily. 90 tablet 3  . Ascorbic Acid (VITAMIN C) 1000 MG tablet Take 1,000 mg by mouth daily.    . benzonatate (TESSALON) 200 MG capsule Take 1 capsule (200 mg total) by mouth 3 (three) times daily as needed for cough. 30 capsule 2  . bifidobacterium infantis (ALIGN) capsule Take 1 capsule by mouth daily. 60 capsule   . Biotin (BIOTIN 5000) 5 MG CAPS Take 5,000 mg by mouth daily.    . calcium carbonate (TUMS - DOSED IN MG ELEMENTAL CALCIUM) 500 MG chewable tablet Chew 3 tablets by mouth 2 (two) times daily.    . citalopram (CELEXA) 20 MG tablet Take 20 mg by mouth every morning.    . cyclobenzaprine (FLEXERIL) 10 MG tablet Take 10 mg by mouth 2 (two) times daily.    Marland Kitchen  esomeprazole (NEXIUM) 20 MG capsule Take 20 mg by mouth 2 (two) times daily before a meal.     . lidocaine-prilocaine (EMLA) cream Apply to affected area once 30 g 3  . lisinopril (PRINIVIL,ZESTRIL) 5 MG tablet Take 5 mg by mouth daily.  3  . meloxicam (MOBIC) 7.5 MG tablet Take 1 tablet (7.5 mg total) by mouth daily. 30 tablet 5  . Multiple Vitamin (MULTIVITAMIN) tablet Take 1 tablet by mouth daily.    . Omega-3 Fatty Acids (FISH OIL) 1200 MG CAPS Take 1,200 mg by mouth daily.    . prochlorperazine (COMPAZINE) 10 MG tablet Take 1 tablet (10 mg total) by mouth every 6 (six) hours as needed (Nausea or vomiting). (Patient not taking: Reported on 05/22/2016) 30 tablet 1  . valACYclovir (VALTREX) 1000 MG tablet Take 1 tablet (1,000 mg total) by mouth 2 (two) times  daily. (Patient not taking: Reported on 05/22/2016) 20 tablet 6   No current facility-administered medications for this encounter.    BP (!) 145/62   Pulse 81   Wt 143 lb 12 oz (65.2 kg)   SpO2 100%   BMI 26.29 kg/m  General: NAD Neck: JVP 7 cm, no thyromegaly or thyroid nodule.  Lungs: CTAB CV: Nondisplaced PMI.  Heart regular S1/S2, no S3/S4, no murmur.  No peripheral edema.  No carotid bruit.  Normal pedal pulses.  Abdomen: Soft, nontender, no hepatosplenomegaly, no distention.  Skin: Intact without lesions or rashes.  Neurologic: Alert and oriented x 3.  Psych: Normal affect. Extremities: No clubbing or cyanosis.  HEENT: Normal.   Assessment/Plan: 1. Breast cancer:  Echo from today was reviewed, EF is normal and strain stable.  - She will return for echo and office visit in 3 months, if normal this will be her last echo.  2. Chest pain: Nonischemic Cardiolite in 10/17.  No further chest pain.     Loralie Champagne 05/24/2016

## 2016-05-29 ENCOUNTER — Telehealth: Payer: Self-pay | Admitting: Physician Assistant

## 2016-05-30 NOTE — Telephone Encounter (Signed)
Pt aware of recommendation Our next available with mobile unit is September She will call [facility] and schedule this years appt

## 2016-06-05 ENCOUNTER — Ambulatory Visit (HOSPITAL_BASED_OUTPATIENT_CLINIC_OR_DEPARTMENT_OTHER): Payer: BLUE CROSS/BLUE SHIELD

## 2016-06-05 VITALS — BP 137/78 | HR 97 | Temp 98.7°F | Resp 20

## 2016-06-05 DIAGNOSIS — Z5112 Encounter for antineoplastic immunotherapy: Secondary | ICD-10-CM

## 2016-06-05 DIAGNOSIS — C50212 Malignant neoplasm of upper-inner quadrant of left female breast: Secondary | ICD-10-CM | POA: Diagnosis not present

## 2016-06-05 DIAGNOSIS — Z170 Estrogen receptor positive status [ER+]: Secondary | ICD-10-CM

## 2016-06-05 MED ORDER — DIPHENHYDRAMINE HCL 25 MG PO CAPS
ORAL_CAPSULE | ORAL | Status: AC
  Filled 2016-06-05: qty 2

## 2016-06-05 MED ORDER — TRASTUZUMAB CHEMO 150 MG IV SOLR
6.0000 mg/kg | Freq: Once | INTRAVENOUS | Status: AC
  Administered 2016-06-05: 357 mg via INTRAVENOUS
  Filled 2016-06-05: qty 17

## 2016-06-05 MED ORDER — ACETAMINOPHEN 325 MG PO TABS
650.0000 mg | ORAL_TABLET | Freq: Once | ORAL | Status: AC
  Administered 2016-06-05: 650 mg via ORAL

## 2016-06-05 MED ORDER — HEPARIN SOD (PORK) LOCK FLUSH 100 UNIT/ML IV SOLN
500.0000 [IU] | Freq: Once | INTRAVENOUS | Status: AC | PRN
  Administered 2016-06-05: 500 [IU]
  Filled 2016-06-05: qty 5

## 2016-06-05 MED ORDER — SODIUM CHLORIDE 0.9% FLUSH
10.0000 mL | INTRAVENOUS | Status: DC | PRN
  Administered 2016-06-05: 10 mL
  Filled 2016-06-05: qty 10

## 2016-06-05 MED ORDER — DIPHENHYDRAMINE HCL 25 MG PO CAPS
50.0000 mg | ORAL_CAPSULE | Freq: Once | ORAL | Status: AC
  Administered 2016-06-05: 50 mg via ORAL

## 2016-06-05 MED ORDER — SODIUM CHLORIDE 0.9 % IV SOLN
Freq: Once | INTRAVENOUS | Status: AC
  Administered 2016-06-05: 10:00:00 via INTRAVENOUS

## 2016-06-05 MED ORDER — ACETAMINOPHEN 325 MG PO TABS
ORAL_TABLET | ORAL | Status: AC
  Filled 2016-06-05: qty 2

## 2016-06-05 NOTE — Patient Instructions (Signed)
[facility] Discharge Instructions for Patients Receiving Chemotherapy  Today you received the following chemotherapy agents Herceptin  To help prevent nausea and vomiting after your treatment, we encourage you to take your nausea medication    If you develop nausea and vomiting that is not controlled by your nausea medication, call the clinic.   BELOW ARE SYMPTOMS THAT SHOULD BE REPORTED IMMEDIATELY:  *FEVER GREATER THAN 100.5 F  *CHILLS WITH OR WITHOUT FEVER  NAUSEA AND VOMITING THAT IS NOT CONTROLLED WITH YOUR NAUSEA MEDICATION  *UNUSUAL SHORTNESS OF BREATH  *UNUSUAL BRUISING OR BLEEDING  TENDERNESS IN MOUTH AND THROAT WITH OR WITHOUT PRESENCE OF ULCERS  *URINARY PROBLEMS  *BOWEL PROBLEMS  UNUSUAL RASH Items with * indicate a potential emergency and should be followed up as soon as possible.  Feel free to call the clinic you have any questions or concerns. The clinic phone number is [phone].  Please show the CHEMO ALERT CARD at check-in to the Emergency Department and triage nurse.   

## 2016-06-12 DIAGNOSIS — R922 Inconclusive mammogram: Secondary | ICD-10-CM | POA: Diagnosis not present

## 2016-06-12 DIAGNOSIS — Z853 Personal history of malignant neoplasm of breast: Secondary | ICD-10-CM | POA: Diagnosis not present

## 2016-06-24 DIAGNOSIS — J209 Acute bronchitis, unspecified: Secondary | ICD-10-CM | POA: Diagnosis not present

## 2016-06-26 ENCOUNTER — Ambulatory Visit (HOSPITAL_BASED_OUTPATIENT_CLINIC_OR_DEPARTMENT_OTHER): Payer: BLUE CROSS/BLUE SHIELD | Admitting: Adult Health

## 2016-06-26 ENCOUNTER — Other Ambulatory Visit (HOSPITAL_BASED_OUTPATIENT_CLINIC_OR_DEPARTMENT_OTHER): Payer: BLUE CROSS/BLUE SHIELD

## 2016-06-26 ENCOUNTER — Ambulatory Visit (HOSPITAL_BASED_OUTPATIENT_CLINIC_OR_DEPARTMENT_OTHER): Payer: BLUE CROSS/BLUE SHIELD

## 2016-06-26 ENCOUNTER — Encounter: Payer: Self-pay | Admitting: Adult Health

## 2016-06-26 ENCOUNTER — Encounter: Payer: Self-pay | Admitting: *Deleted

## 2016-06-26 DIAGNOSIS — Z170 Estrogen receptor positive status [ER+]: Secondary | ICD-10-CM

## 2016-06-26 DIAGNOSIS — M8580 Other specified disorders of bone density and structure, unspecified site: Secondary | ICD-10-CM | POA: Diagnosis not present

## 2016-06-26 DIAGNOSIS — C50212 Malignant neoplasm of upper-inner quadrant of left female breast: Secondary | ICD-10-CM | POA: Diagnosis not present

## 2016-06-26 DIAGNOSIS — F419 Anxiety disorder, unspecified: Secondary | ICD-10-CM | POA: Diagnosis not present

## 2016-06-26 DIAGNOSIS — Z5112 Encounter for antineoplastic immunotherapy: Secondary | ICD-10-CM

## 2016-06-26 DIAGNOSIS — Z79811 Long term (current) use of aromatase inhibitors: Secondary | ICD-10-CM

## 2016-06-26 LAB — COMPREHENSIVE METABOLIC PANEL
ALT: 34 U/L (ref 0–55)
AST: 27 U/L (ref 5–34)
Albumin: 4 g/dL (ref 3.5–5.0)
Alkaline Phosphatase: 64 U/L (ref 40–150)
Anion Gap: 11 mEq/L (ref 3–11)
BUN: 27 mg/dL — ABNORMAL HIGH (ref 7.0–26.0)
CALCIUM: 10.1 mg/dL (ref 8.4–10.4)
CHLORIDE: 95 meq/L — AB (ref 98–109)
CO2: 25 meq/L (ref 22–29)
CREATININE: 1.1 mg/dL (ref 0.6–1.1)
EGFR: 55 mL/min/{1.73_m2} — ABNORMAL LOW (ref >90)
Glucose: 95 mg/dl (ref 70–140)
Potassium: 4.4 mEq/L (ref 3.5–5.1)
Sodium: 131 mEq/L — ABNORMAL LOW (ref 136–145)
Total Bilirubin: <0.22 mg/dL (ref 0.20–1.20)
Total Protein: 7.3 g/dL (ref 6.4–8.3)

## 2016-06-26 LAB — CBC WITH DIFFERENTIAL/PLATELET
BASO%: 0.2 % (ref 0.0–2.0)
Basophils Absolute: 0 10*3/uL (ref 0.0–0.1)
EOS%: 0.1 % (ref 0.0–7.0)
Eosinophils Absolute: 0 10*3/uL (ref 0.0–0.5)
HEMATOCRIT: 26.7 % — AB (ref 34.8–46.6)
HGB: 8.9 g/dL — ABNORMAL LOW (ref 11.6–15.9)
LYMPH#: 1.8 10*3/uL (ref 0.9–3.3)
LYMPH%: 14.3 % (ref 14.0–49.7)
MCH: 28.7 pg (ref 25.1–34.0)
MCHC: 33.3 g/dL (ref 31.5–36.0)
MCV: 86.3 fL (ref 79.5–101.0)
MONO#: 0.8 10*3/uL (ref 0.1–0.9)
MONO%: 6.5 % (ref 0.0–14.0)
NEUT%: 78.9 % — AB (ref 38.4–76.8)
NEUTROS ABS: 9.8 10*3/uL — AB (ref 1.5–6.5)
Platelets: 283 10*3/uL (ref 145–400)
RBC: 3.1 10*6/uL — ABNORMAL LOW (ref 3.70–5.45)
RDW: 14 % (ref 11.2–14.5)
WBC: 12.4 10*3/uL — AB (ref 3.9–10.3)

## 2016-06-26 MED ORDER — SODIUM CHLORIDE 0.9 % IV SOLN
Freq: Once | INTRAVENOUS | Status: AC
  Administered 2016-06-26: 10:00:00 via INTRAVENOUS

## 2016-06-26 MED ORDER — SODIUM CHLORIDE 0.9 % IV SOLN: 6.0000 mg/kg | Freq: Once | INTRAVENOUS | Status: DC

## 2016-06-26 MED ORDER — TRASTUZUMAB CHEMO 150 MG IV SOLR
6.0000 mg/kg | Freq: Once | INTRAVENOUS | Status: AC
  Administered 2016-06-26: 420 mg via INTRAVENOUS
  Filled 2016-06-26: qty 20

## 2016-06-26 MED ORDER — ACETAMINOPHEN 325 MG PO TABS
650.0000 mg | ORAL_TABLET | Freq: Once | ORAL | Status: AC
  Administered 2016-06-26: 650 mg via ORAL

## 2016-06-26 MED ORDER — DIPHENHYDRAMINE HCL 25 MG PO CAPS
ORAL_CAPSULE | ORAL | Status: AC
  Filled 2016-06-26: qty 2

## 2016-06-26 MED ORDER — DIPHENHYDRAMINE HCL 25 MG PO CAPS
50.0000 mg | ORAL_CAPSULE | Freq: Once | ORAL | Status: AC
  Administered 2016-06-26: 50 mg via ORAL

## 2016-06-26 MED ORDER — HEPARIN SOD (PORK) LOCK FLUSH 100 UNIT/ML IV SOLN
500.0000 [IU] | Freq: Once | INTRAVENOUS | Status: AC | PRN
  Administered 2016-06-26: 500 [IU]
  Filled 2016-06-26: qty 5

## 2016-06-26 MED ORDER — SODIUM CHLORIDE 0.9% FLUSH
10.0000 mL | INTRAVENOUS | Status: DC | PRN
  Administered 2016-06-26: 10 mL
  Filled 2016-06-26: qty 10

## 2016-06-26 MED ORDER — ACETAMINOPHEN 325 MG PO TABS
ORAL_TABLET | ORAL | Status: AC
  Filled 2016-06-26: qty 2

## 2016-06-26 NOTE — Assessment & Plan Note (Addendum)
Left simple mastectomy with reconstruction 06/24/2015: IDC grade 3, 2.5 cm, with high-grade DCIS, ALH, LVI present, margins negative, 0/1 sentinel node negative, , ER 100%, PR 5%, HER-2 positive ratio 1.56 with average copy #8.25, Ki-67 20%  Pathologic stage:T2 N0 stage II a Treatment plan: 1. Adjuvant chemotherapy with TCHP X 6 completed 10/30/17followed by Herceptin maintenance for 1 year 2. Followed by adjuvant hormonal therapy with anastrozole started 11/28/2015 ---------------------------------------------------------------------------------------------------------- Current treatment: Maintenance Herceptin every 3 weeks until October 2018 Echocardiogram 05/22/16 LVEF 60-65% Anastrozole started 11/28/15  Anastrozole Toxicities: Denies any hot flashes or myalgias. Bone density test 04/12/2016: T score -2.2. Osteopenia. She is taking Fosamax and calcium and vitamin D.  Fosamax toxicities:Tolerating it well Anxiety: On antianxiety medications  Heidi Walls is doing well today.  She will proceed with Herceptin.  Her labs are slightly off due to a recent infection she had that she is recovering from.  Due to this, we will re check prior to next Herceptin in 3 weeks.  She will return in 6 weeks for labs, evaluation, and her final Herceptin.

## 2016-06-26 NOTE — Patient Instructions (Signed)
[facility] Discharge Instructions for Patients Receiving Chemotherapy  Today you received the following chemotherapy agents: Herceptin   To help prevent nausea and vomiting after your treatment, we encourage you to take your nausea medication as directed.    If you develop nausea and vomiting that is not controlled by your nausea medication, call the clinic.   BELOW ARE SYMPTOMS THAT SHOULD BE REPORTED IMMEDIATELY:  *FEVER GREATER THAN 100.5 F  *CHILLS WITH OR WITHOUT FEVER  NAUSEA AND VOMITING THAT IS NOT CONTROLLED WITH YOUR NAUSEA MEDICATION  *UNUSUAL SHORTNESS OF BREATH  *UNUSUAL BRUISING OR BLEEDING  TENDERNESS IN MOUTH AND THROAT WITH OR WITHOUT PRESENCE OF ULCERS  *URINARY PROBLEMS  *BOWEL PROBLEMS  UNUSUAL RASH Items with * indicate a potential emergency and should be followed up as soon as possible.  Feel free to call the clinic you have any questions or concerns. The clinic phone number is [phone].  Please show the CHEMO ALERT CARD at check-in to the Emergency Department and triage nurse.   

## 2016-06-26 NOTE — Progress Notes (Signed)
[facility] Follow up:    Terald Sleeper, PA-C [place] [address]   DIAGNOSIS: Cancer Staging Breast cancer of upper-inner quadrant of left female breast [facility]) Staging form: Breast, AJCC 7th Edition - Clinical stage from 06/08/2015: Stage IA (T1c, N0, M0) - Unsigned Staging comments: Staged at breast conference on 5.31.17   SUMMARY OF ONCOLOGIC HISTORY:   Breast cancer of upper-inner quadrant of left female breast ([place])   05/26/2015 Initial Diagnosis    Screen detected left breast asymmetry (posterior 1.6 cm): Grade 2-3 IDC ER/PR positive HER-2 positive Ki-67 20% plus calcs (span 6.1 cm): High-grade DCIS with suspicious foci of invasion (4.2 cm apart)      06/24/2015 Surgery    Left simple mastectomy with reconstruction: IDC grade 3, 2.5 cm, with high-grade DCIS, ALH, LVI present, margins negative, 0/1 sentinel node negative, T2 N0 stage II a, ER 100%, PR 5%, HER-2 positive ratio 1.56 with average copy #8.25, Ki-67 20%      07/25/2015 - 11/03/2015 Chemotherapy    Adjuvant chemotherapy with TCH Perjeta 6 cycles followed by Herceptin maintenance for 1 year      11/28/2015 -  Anti-estrogen oral therapy    Adjuvant anastrozole 1 mg daily       CURRENT THERAPY: Anastrozole and Herceptin  INTERVAL HISTORY: LORRIANE DEHART 61 y.o. female returns for evaluation prior to Herceptin.  She is doing well today.  She did have an allergic bronchitis flare and ended up being on steroids, and feeling poorly for a few days.  She is feeling better than previously.  She denies any chest pain, palpitations, or any concerns from the Herceptin.  She denies any issues from the Anastrozole except bone pain.     Patient Active Problem List   Diagnosis Date Noted  . Chemotherapy induced diarrhea 10/17/2015  . Osteoporosis 09/07/2015  . Generalized anxiety disorder 09/07/2015  . Osteoarthritis of right knee 09/07/2015  . GERD (gastroesophageal reflux disease)  09/07/2015  . Depression 09/07/2015  . Irritability 09/07/2015  . Acquired absence of left breast and nipple 06/30/2015  . Breast cancer of upper-inner quadrant of left female breast ([place]) 05/26/2015    is allergic to codeine.  MEDICAL HISTORY: Past Medical History:  Diagnosis Date  . Anxiety    OCD  . Arthritis    knees and hips  . Breast cancer ([place])   . Breast cancer of upper-inner quadrant of left female breast ([place]) 05/26/2015  . GERD (gastroesophageal reflux disease)   . Hypertension   . Insomnia     SURGICAL HISTORY: Past Surgical History:  Procedure Laterality Date  . BREAST RECONSTRUCTION WITH PLACEMENT OF TISSUE EXPANDER AND FLEX HD (ACELLULAR HYDRATED DERMIS) Left 06/24/2015   Procedure: LEFT BREAST RECONSTRUCTION WITH PLACEMENT OF TISSUE EXPANDER AND  ACELLULAR HYDRATED DERMIS (CORTIVA);  Surgeon: Irene Limbo, MD;  Location: [place];  Service: Plastics;  Laterality: Left;  . KNEE SURGERY  2000  . LIPOSUCTION WITH LIPOFILLING Left 12/23/2015   Procedure: LIPOFILLING TO LEFT CHEST FROM ABDOMEN  ;  Surgeon: Irene Limbo, MD;  Location: [place];  Service: Plastics;  Laterality: Left;  Marland Kitchen MASTECTOMY W/ SENTINEL NODE BIOPSY Left 06/24/2015   Procedure: LEFT TOTAL MASTECTOMY WITH SENTINEL LYMPH NODE BIOPSY;  Surgeon: Excell Seltzer, MD;  Location: [place];  Service: General;  Laterality: Left;  Marland Kitchen MASTOPEXY Right 12/23/2015   Procedure: RIGHT BREAST MASTOPEXY;  Surgeon: Irene Limbo, MD;  Location:  [facility];  Service: Plastics;  Laterality: Right;  . PORTACATH PLACEMENT Right 06/24/2015   Procedure: INSERTION PORT-A-CATH;  Surgeon: Glenna Fellows, MD;  Location: [facility];  Service: General;  Laterality: Right;  . REMOVAL OF BILATERAL TISSUE EXPANDERS WITH PLACEMENT OF BILATERAL BREAST IMPLANTS Left 12/23/2015   Procedure: REMOVAL OF LEFT  TISSUE EXPANDERS WITH PLACEMENT OF  LEFT  BREAST IMPLANTS;  Surgeon: Glenna Fellows, MD;  Location: [facility];  Service: Plastics;  Laterality: Left;    SOCIAL HISTORY: Social History   Social History  . Marital status: Married    Spouse name: N/A  . Number of children: N/A  . Years of education: N/A   Occupational History  . Not on file.   Social History Main Topics  . Smoking status: Former Games developer  . Smokeless tobacco: Never Used  . Alcohol use Yes     Comment: social  . Drug use: No  . Sexual activity: Not on file   Other Topics Concern  . Not on file   Social History Narrative  . No narrative on file    FAMILY HISTORY: Family History  Problem Relation Age of Onset  . Heart failure Mother   . Diabetes Mother   . Hypertension Mother   . Hypertension Father   . Breast cancer Maternal Grandmother     Review of Systems  Constitutional: Negative for appetite change, chills, diaphoresis, fatigue, fever and unexpected weight change.  HENT:   Negative for hearing loss and lump/mass.   Eyes: Negative for eye problems and icterus.  Respiratory: Negative for chest tightness, cough and shortness of breath.   Cardiovascular: Negative for chest pain, leg swelling and palpitations.  Gastrointestinal: Negative for abdominal distention and abdominal pain.  Endocrine: Negative for hot flashes.  Genitourinary: Negative for difficulty urinating.   Musculoskeletal: Positive for arthralgias.  Skin: Negative for itching and rash.  Neurological: Negative for dizziness, extremity weakness and headaches.  Hematological: Negative for adenopathy. Does not bruise/bleed easily.  Psychiatric/Behavioral: Negative for depression. The patient is not nervous/anxious.       PHYSICAL EXAMINATION  ECOG PERFORMANCE STATUS: 1 - Symptomatic but completely ambulatory  Vitals:   06/26/16 0837  BP: (!) 156/65  Pulse: 89  Resp: 18  Temp: 98.1 F (36.7 C)    Physical Exam  Constitutional: She is oriented  to person, place, and time and well-developed, well-nourished, and in no distress.  HENT:  Head: Normocephalic and atraumatic.  Mouth/Throat: Oropharynx is clear and moist. No oropharyngeal exudate.  Eyes: Pupils are equal, round, and reactive to light. No scleral icterus.  Neck: Neck supple.  Cardiovascular: Normal rate, regular rhythm and normal heart sounds.   Pulmonary/Chest: Effort normal and breath sounds normal. No respiratory distress. She has no wheezes.  Abdominal: Soft. Bowel sounds are normal. She exhibits no distension and no mass. There is no tenderness. There is no rebound and no guarding.  Musculoskeletal: She exhibits no edema.  Lymphadenopathy:    She has no cervical adenopathy.  Neurological: She is alert and oriented to person, place, and time.  Skin: Skin is warm and dry. No rash noted.  Psychiatric: Mood and affect normal.    LABORATORY DATA:  CBC    Component Value Date/Time   WBC 12.4 (H) 06/26/2016 0818   RBC 3.10 (L) 06/26/2016 0818   HGB 8.9 (L) 06/26/2016 0818   HCT 26.7 (L) 06/26/2016 0818   PLT 283 06/26/2016 0818   MCV 86.3 06/26/2016  0818   MCH 28.7 06/26/2016 0818   MCHC 33.3 06/26/2016 0818   RDW 14.0 06/26/2016 0818   LYMPHSABS 1.8 06/26/2016 0818   MONOABS 0.8 06/26/2016 0818   EOSABS 0.0 06/26/2016 0818   BASOSABS 0.0 06/26/2016 0818    CMP     Component Value Date/Time   NA 131 (L) 06/26/2016 0818   K 4.4 06/26/2016 0818   CO2 25 06/26/2016 0818   GLUCOSE 95 06/26/2016 0818   BUN 27.0 (H) 06/26/2016 0818   CREATININE 1.1 06/26/2016 0818   CALCIUM 10.1 06/26/2016 0818   PROT 7.3 06/26/2016 0818   ALBUMIN 4.0 06/26/2016 0818   AST 27 06/26/2016 0818   ALT 34 06/26/2016 0818   ALKPHOS 64 06/26/2016 0818   BILITOT <0.22 06/26/2016 0818      ASSESSMENT and PLAN:   Breast cancer of upper-inner quadrant of left female breast ([place]) Left simple mastectomy with reconstruction 06/24/2015: IDC grade 3, 2.5 cm, with high-grade DCIS,  ALH, LVI present, margins negative, 0/1 sentinel node negative, , ER 100%, PR 5%, HER-2 positive ratio 1.56 with average copy #8.25, Ki-67 20%  Pathologic stage:T2 N0 stage II a Treatment plan: 1. Adjuvant chemotherapy with TCHP X 6 completed 10/30/17followed by Herceptin maintenance for 1 year 2. Followed by adjuvant hormonal therapy with anastrozole started 11/28/2015 ---------------------------------------------------------------------------------------------------------- Current treatment: Maintenance Herceptin every 3 weeks until October 2018 Echocardiogram 05/22/16 LVEF 60-65% Anastrozole started 11/28/15  Anastrozole Toxicities: Denies any hot flashes or myalgias. Bone density test 04/12/2016: T score -2.2. Osteopenia. She is taking Fosamax and calcium and vitamin D.  Fosamax toxicities:Tolerating it well Anxiety: On antianxiety medications  Aeon is doing well today.  She will proceed with Herceptin.  Her labs are slightly off due to a recent infection she had that she is recovering from.  Due to this, we will re check prior to next Herceptin in 3 weeks.  She will return in 6 weeks for labs, evaluation, and her final Herceptin.       All questions were answered. The patient knows to call the clinic with any problems, questions or concerns. We can certainly see the patient much sooner if necessary.  A total of (30) minutes of face-to-face time was spent with this patient with greater than 50% of that time in counseling and care-coordination.   This note was electronically signed. Scot Dock, NP 06/26/2016

## 2016-06-27 ENCOUNTER — Other Ambulatory Visit: Payer: Self-pay | Admitting: Physician Assistant

## 2016-07-10 ENCOUNTER — Ambulatory Visit (INDEPENDENT_AMBULATORY_CARE_PROVIDER_SITE_OTHER): Payer: BLUE CROSS/BLUE SHIELD | Admitting: Physician Assistant

## 2016-07-10 ENCOUNTER — Encounter: Payer: Self-pay | Admitting: Physician Assistant

## 2016-07-10 VITALS — BP 150/77 | HR 93 | Temp 97.4°F | Ht 62.0 in | Wt 151.6 lb

## 2016-07-10 DIAGNOSIS — J4531 Mild persistent asthma with (acute) exacerbation: Secondary | ICD-10-CM

## 2016-07-10 DIAGNOSIS — J40 Bronchitis, not specified as acute or chronic: Secondary | ICD-10-CM | POA: Diagnosis not present

## 2016-07-10 DIAGNOSIS — J4540 Moderate persistent asthma, uncomplicated: Secondary | ICD-10-CM | POA: Insufficient documentation

## 2016-07-10 MED ORDER — AMLODIPINE BESYLATE 5 MG PO TABS: 5.0000 mg | ORAL_TABLET | Freq: Every day | ORAL | 3 refills | Status: DC

## 2016-07-10 MED ORDER — ALBUTEROL SULFATE HFA 108 (90 BASE) MCG/ACT IN AERS: 2.0000 | INHALATION_SPRAY | Freq: Four times a day (QID) | RESPIRATORY_TRACT | 0 refills | Status: DC | PRN

## 2016-07-10 MED ORDER — FLUCONAZOLE 150 MG PO TABS: 150.0000 mg | ORAL_TABLET | Freq: Every day | ORAL | 2 refills | Status: DC

## 2016-07-10 MED ORDER — METHYLPREDNISOLONE ACETATE 80 MG/ML IJ SUSP
80.0000 mg | Freq: Once | INTRAMUSCULAR | Status: AC
  Administered 2016-07-10: 80 mg via INTRAMUSCULAR

## 2016-07-10 MED ORDER — AZITHROMYCIN 250 MG PO TABS: ORAL_TABLET | ORAL | 0 refills | Status: DC

## 2016-07-10 MED ORDER — FLUTICASONE FUROATE-VILANTEROL 100-25 MCG/INH IN AEPB: 1.0000 | INHALATION_SPRAY | Freq: Every day | RESPIRATORY_TRACT | 11 refills | Status: DC

## 2016-07-10 NOTE — Progress Notes (Signed)
BP (!) 150/77   Pulse 93   Temp 97.4 F (36.3 C) (Oral)   Ht 5\' 2"  (1.575 m)   Wt 151 lb 9.6 oz (68.8 kg)   BMI 27.73 kg/m    Subjective:    Patient ID: Heidi Walls, female    DOB: 20-Aug-1955, 61 y.o.   MRN: [id number]  HPI: Heidi Walls is a 61 y.o. female presenting on 07/10/2016 for Allergies and Cough  Patient has had many weeks of persistent cough. Is worse at night. She does have occasional production. She feels that she has been sick for the past week. She's had a cough for many weeks related to her chemotherapy. She also has a distant history of smoking. She states that she has wheezing and a tight cough at all times.  Relevant past medical, surgical, family and social history reviewed and updated as indicated. Allergies and medications reviewed and updated.  Past Medical History:  Diagnosis Date  . Anxiety    OCD  . Arthritis    knees and hips  . Breast cancer ([place])   . Breast cancer of upper-inner quadrant of left female breast ([place]) 05/26/2015  . GERD (gastroesophageal reflux disease)   . Hypertension   . Insomnia     Past Surgical History:  Procedure Laterality Date  . BREAST RECONSTRUCTION WITH PLACEMENT OF TISSUE EXPANDER AND FLEX HD (ACELLULAR HYDRATED DERMIS) Left 06/24/2015   Procedure: LEFT BREAST RECONSTRUCTION WITH PLACEMENT OF TISSUE EXPANDER AND  ACELLULAR HYDRATED DERMIS (CORTIVA);  Surgeon: Irene Limbo, MD;  Location: [place];  Service: Plastics;  Laterality: Left;  . KNEE SURGERY  2000  . LIPOSUCTION WITH LIPOFILLING Left 12/23/2015   Procedure: LIPOFILLING TO LEFT CHEST FROM ABDOMEN  ;  Surgeon: Irene Limbo, MD;  Location: [place];  Service: Plastics;  Laterality: Left;  Marland Kitchen MASTECTOMY W/ SENTINEL NODE BIOPSY Left 06/24/2015   Procedure: LEFT TOTAL MASTECTOMY WITH SENTINEL LYMPH NODE BIOPSY;  Surgeon: Excell Seltzer, MD;  Location: [facility];  Service: General;  Laterality: Left;  Marland Kitchen  MASTOPEXY Right 12/23/2015   Procedure: RIGHT BREAST MASTOPEXY;  Surgeon: Irene Limbo, MD;  Location: [place];  Service: Plastics;  Laterality: Right;  . PORTACATH PLACEMENT Right 06/24/2015   Procedure: INSERTION PORT-A-CATH;  Surgeon: Excell Seltzer, MD;  Location: [place];  Service: General;  Laterality: Right;  . REMOVAL OF BILATERAL TISSUE EXPANDERS WITH PLACEMENT OF BILATERAL BREAST IMPLANTS Left 12/23/2015   Procedure: REMOVAL OF LEFT  TISSUE EXPANDERS WITH PLACEMENT OF LEFT  BREAST IMPLANTS;  Surgeon: Irene Limbo, MD;  Location: [place];  Service: Plastics;  Laterality: Left;    Review of Systems  Constitutional: Positive for fatigue. Negative for activity change, appetite change, chills and fever.  HENT: Positive for congestion, postnasal drip and sore throat.   Eyes: Negative.   Respiratory: Positive for cough, shortness of breath and wheezing.   Cardiovascular: Negative.  Negative for chest pain, palpitations and leg swelling.  Gastrointestinal: Negative.   Genitourinary: Negative.   Musculoskeletal: Negative.   Skin: Negative.   Neurological: Positive for headaches.    Allergies as of 07/10/2016      Reactions   Codeine Shortness Of Breath   Pt reports this was 30 years ago.      Medication List       Accurate as of 07/10/16  4:41 PM. Always use your most recent med list.  albuterol 108 (90 Base) MCG/ACT inhaler Commonly known as:  PROVENTIL HFA;VENTOLIN HFA Inhale 2 puffs into the lungs every 6 (six) hours as needed for wheezing or shortness of breath.   alendronate 70 MG tablet Commonly known as:  FOSAMAX Take 1 tablet (70 mg total) by mouth once a week. Take with a full glass of water on an empty stomach.   ALPRAZolam 0.25 MG tablet Commonly known as:  XANAX Take 1 tablet (0.25 mg total) by mouth 3 (three) times daily as needed.   amLODipine 5 MG tablet Commonly known as:   NORVASC Take 1 tablet (5 mg total) by mouth daily.   anastrozole 1 MG tablet Commonly known as:  ARIMIDEX Take 1 tablet (1 mg total) by mouth daily.   azithromycin 250 MG tablet Commonly known as:  ZITHROMAX Z-PAK Take as directed   bifidobacterium infantis capsule Take 1 capsule by mouth daily.   BIOTIN 5000 5 MG Caps Generic drug:  Biotin Take 5,000 mg by mouth daily.   calcium carbonate 500 MG chewable tablet Commonly known as:  TUMS - dosed in mg elemental calcium Chew 3 tablets by mouth 2 (two) times daily.   chlorpheniramine-HYDROcodone 10-8 MG/5ML Suer Commonly known as:  TUSSIONEX Take 5 mLs by mouth daily.   citalopram 20 MG tablet Commonly known as:  CELEXA Take 20 mg by mouth every morning.   cyclobenzaprine 10 MG tablet Commonly known as:  FLEXERIL TAKE 1 TABLET (10 MG TOTAL) BY MOUTH 3 (THREE) TIMES DAILY.   esomeprazole 20 MG capsule Commonly known as:  NEXIUM Take 20 mg by mouth 2 (two) times daily before a meal.   Fish Oil 1200 MG Caps Take 1,200 mg by mouth daily.   fluconazole 150 MG tablet Commonly known as:  DIFLUCAN Take 1 tablet (150 mg total) by mouth daily.   fluticasone furoate-vilanterol 100-25 MCG/INH Aepb Commonly known as:  BREO ELLIPTA Inhale 1 puff into the lungs daily.   lidocaine-prilocaine cream Commonly known as:  EMLA Apply to affected area once   meloxicam 7.5 MG tablet Commonly known as:  MOBIC Take 1 tablet (7.5 mg total) by mouth daily.   multivitamin tablet Take 1 tablet by mouth daily.   prochlorperazine 10 MG tablet Commonly known as:  COMPAZINE Take 1 tablet (10 mg total) by mouth every 6 (six) hours as needed (Nausea or vomiting).   valACYclovir 1000 MG tablet Commonly known as:  VALTREX Take 1 tablet (1,000 mg total) by mouth 2 (two) times daily.   vitamin C 1000 MG tablet Take 1,000 mg by mouth daily.          Objective:    BP (!) 150/77   Pulse 93   Temp 97.4 F (36.3 C) (Oral)   Ht 5\' 2"   (1.575 m)   Wt 151 lb 9.6 oz (68.8 kg)   BMI 27.73 kg/m   Allergies  Allergen Reactions  . Codeine Shortness Of Breath    Pt reports this was 30 years ago.    Physical Exam  Constitutional: She is oriented to person, place, and time. She appears well-developed and well-nourished.  HENT:  Head: Normocephalic and atraumatic.  Right Ear: A middle ear effusion is present.  Left Ear: A middle ear effusion is present.  Nose: Mucosal edema present. Right sinus exhibits no frontal sinus tenderness. Left sinus exhibits no frontal sinus tenderness.  Mouth/Throat: Posterior oropharyngeal erythema present. No oropharyngeal exudate or tonsillar abscesses.  Eyes: Conjunctivae and EOM are normal. Pupils are  equal, round, and reactive to light.  Neck: Normal range of motion.  Cardiovascular: Normal rate, regular rhythm, normal heart sounds and intact distal pulses.   Pulmonary/Chest: Effort normal. She has wheezes. She exhibits tenderness.  Abdominal: Soft. Bowel sounds are normal.  Neurological: She is alert and oriented to person, place, and time. She has normal reflexes.  Skin: Skin is warm and dry. No rash noted.  Psychiatric: She has a normal mood and affect. Her behavior is normal. Judgment and thought content normal.  Nursing note and vitals reviewed.       Assessment & Plan:   1. Mild persistent asthma with acute exacerbation - methylPREDNISolone acetate (DEPO-MEDROL) injection 80 mg; Inject 1 mL (80 mg total) into the muscle once. - albuterol (PROVENTIL HFA;VENTOLIN HFA) 108 (90 Base) MCG/ACT inhaler; Inhale 2 puffs into the lungs every 6 (six) hours as needed for wheezing or shortness of breath.  Dispense: 1 Inhaler; Refill: 0 - fluticasone furoate-vilanterol (BREO ELLIPTA) 100-25 MCG/INH AEPB; Inhale 1 puff into the lungs daily.  Dispense: 1 each; Refill: 11  2. Bronchitis - azithromycin (ZITHROMAX Z-PAK) 250 MG tablet; Take as directed  Dispense: 6 each; Refill: 0 -  methylPREDNISolone acetate (DEPO-MEDROL) injection 80 mg; Inject 1 mL (80 mg total) into the muscle once. - fluconazole (DIFLUCAN) 150 MG tablet; Take 1 tablet (150 mg total) by mouth daily.  Dispense: 4 tablet; Refill: 2   Continue all other maintenance medications as listed above.  Follow up plan: Follow-up as needed or worsening of symptoms. Call office for any issues.   Educational handout given for asthma  Terald Sleeper PA-C [facility] [address]  [place], [place] [address] [phone]   07/10/2016, 4:41 PM

## 2016-07-10 NOTE — Patient Instructions (Signed)

## 2016-07-11 ENCOUNTER — Other Ambulatory Visit: Payer: Self-pay | Admitting: Physician Assistant

## 2016-07-17 ENCOUNTER — Other Ambulatory Visit (HOSPITAL_BASED_OUTPATIENT_CLINIC_OR_DEPARTMENT_OTHER): Payer: BLUE CROSS/BLUE SHIELD

## 2016-07-17 ENCOUNTER — Other Ambulatory Visit: Payer: Self-pay | Admitting: Physician Assistant

## 2016-07-17 ENCOUNTER — Ambulatory Visit (HOSPITAL_BASED_OUTPATIENT_CLINIC_OR_DEPARTMENT_OTHER): Payer: BLUE CROSS/BLUE SHIELD

## 2016-07-17 VITALS — BP 172/84 | HR 98 | Resp 18

## 2016-07-17 DIAGNOSIS — C50212 Malignant neoplasm of upper-inner quadrant of left female breast: Secondary | ICD-10-CM

## 2016-07-17 DIAGNOSIS — F411 Generalized anxiety disorder: Secondary | ICD-10-CM

## 2016-07-17 DIAGNOSIS — Z5112 Encounter for antineoplastic immunotherapy: Secondary | ICD-10-CM

## 2016-07-17 DIAGNOSIS — Z170 Estrogen receptor positive status [ER+]: Secondary | ICD-10-CM

## 2016-07-17 LAB — COMPREHENSIVE METABOLIC PANEL
ALT: 24 U/L (ref 0–55)
AST: 21 U/L (ref 5–34)
Albumin: 4.1 g/dL (ref 3.5–5.0)
Alkaline Phosphatase: 83 U/L (ref 40–150)
Anion Gap: 9 mEq/L (ref 3–11)
BUN: 22.6 mg/dL (ref 7.0–26.0)
CHLORIDE: 100 meq/L (ref 98–109)
CO2: 26 meq/L (ref 22–29)
CREATININE: 1.1 mg/dL (ref 0.6–1.1)
Calcium: 10.1 mg/dL (ref 8.4–10.4)
EGFR: 57 mL/min/{1.73_m2} — ABNORMAL LOW (ref >90)
Glucose: 100 mg/dl (ref 70–140)
POTASSIUM: 4.3 meq/L (ref 3.5–5.1)
Sodium: 135 mEq/L — ABNORMAL LOW (ref 136–145)
Total Bilirubin: 0.28 mg/dL (ref 0.20–1.20)
Total Protein: 7.7 g/dL (ref 6.4–8.3)

## 2016-07-17 LAB — CBC WITH DIFFERENTIAL/PLATELET
BASO%: 0.4 % (ref 0.0–2.0)
Basophils Absolute: 0 10*3/uL (ref 0.0–0.1)
EOS%: 2.1 % (ref 0.0–7.0)
Eosinophils Absolute: 0.1 10*3/uL (ref 0.0–0.5)
HCT: 30.5 % — ABNORMAL LOW (ref 34.8–46.6)
HGB: 10.2 g/dL — ABNORMAL LOW (ref 11.6–15.9)
LYMPH#: 1.6 10*3/uL (ref 0.9–3.3)
LYMPH%: 25.6 % (ref 14.0–49.7)
MCH: 29 pg (ref 25.1–34.0)
MCHC: 33.5 g/dL (ref 31.5–36.0)
MCV: 86.6 fL (ref 79.5–101.0)
MONO#: 0.5 10*3/uL (ref 0.1–0.9)
MONO%: 8.6 % (ref 0.0–14.0)
NEUT#: 3.9 10*3/uL (ref 1.5–6.5)
NEUT%: 63.3 % (ref 38.4–76.8)
Platelets: 326 10*3/uL (ref 145–400)
RBC: 3.53 10*6/uL — ABNORMAL LOW (ref 3.70–5.45)
RDW: 14.3 % (ref 11.2–14.5)
WBC: 6.1 10*3/uL (ref 3.9–10.3)

## 2016-07-17 MED ORDER — ALPRAZOLAM 0.25 MG PO TABS: 0.2500 mg | ORAL_TABLET | Freq: Three times a day (TID) | ORAL | 2 refills | Status: DC | PRN

## 2016-07-17 MED ORDER — ACETAMINOPHEN 325 MG PO TABS
ORAL_TABLET | ORAL | Status: AC
  Filled 2016-07-17: qty 2

## 2016-07-17 MED ORDER — SODIUM CHLORIDE 0.9 % IV SOLN
Freq: Once | INTRAVENOUS | Status: AC
  Administered 2016-07-17: 10:00:00 via INTRAVENOUS

## 2016-07-17 MED ORDER — HEPARIN SOD (PORK) LOCK FLUSH 100 UNIT/ML IV SOLN
500.0000 [IU] | Freq: Once | INTRAVENOUS | Status: AC | PRN
  Administered 2016-07-17: 500 [IU]
  Filled 2016-07-17: qty 5

## 2016-07-17 MED ORDER — DIPHENHYDRAMINE HCL 25 MG PO CAPS
ORAL_CAPSULE | ORAL | Status: AC
  Filled 2016-07-17: qty 2

## 2016-07-17 MED ORDER — TRASTUZUMAB CHEMO 150 MG IV SOLR
6.0000 mg/kg | Freq: Once | INTRAVENOUS | Status: AC
  Administered 2016-07-17: 420 mg via INTRAVENOUS
  Filled 2016-07-17: qty 20

## 2016-07-17 MED ORDER — ACETAMINOPHEN 325 MG PO TABS
650.0000 mg | ORAL_TABLET | Freq: Once | ORAL | Status: AC
  Administered 2016-07-17: 650 mg via ORAL

## 2016-07-17 MED ORDER — SODIUM CHLORIDE 0.9% FLUSH
10.0000 mL | INTRAVENOUS | Status: DC | PRN
  Administered 2016-07-17: 10 mL
  Filled 2016-07-17: qty 10

## 2016-07-17 MED ORDER — DIPHENHYDRAMINE HCL 25 MG PO CAPS
50.0000 mg | ORAL_CAPSULE | Freq: Once | ORAL | Status: AC
  Administered 2016-07-17: 50 mg via ORAL

## 2016-07-17 NOTE — Progress Notes (Signed)
Pt reports 3 weeks ago after treatment pt reports fainting after rushing into the building. Pt reports allergies. Pt states her primary MD is aware of these and is monitoring pt.

## 2016-07-17 NOTE — Telephone Encounter (Signed)
Rx called into pharmacy and pt is aware. 

## 2016-07-17 NOTE — Telephone Encounter (Signed)
Please advise 

## 2016-07-17 NOTE — Patient Instructions (Signed)
[facility] Discharge Instructions for Patients Receiving Chemotherapy  Today you received the following chemotherapy agents: Herceptin   To help prevent nausea and vomiting after your treatment, we encourage you to take your nausea medication as directed.    If you develop nausea and vomiting that is not controlled by your nausea medication, call the clinic.   BELOW ARE SYMPTOMS THAT SHOULD BE REPORTED IMMEDIATELY:  *FEVER GREATER THAN 100.5 F  *CHILLS WITH OR WITHOUT FEVER  NAUSEA AND VOMITING THAT IS NOT CONTROLLED WITH YOUR NAUSEA MEDICATION  *UNUSUAL SHORTNESS OF BREATH  *UNUSUAL BRUISING OR BLEEDING  TENDERNESS IN MOUTH AND THROAT WITH OR WITHOUT PRESENCE OF ULCERS  *URINARY PROBLEMS  *BOWEL PROBLEMS  UNUSUAL RASH Items with * indicate a potential emergency and should be followed up as soon as possible.  Feel free to call the clinic you have any questions or concerns. The clinic phone number is [phone].  Please show the CHEMO ALERT CARD at check-in to the Emergency Department and triage nurse.   

## 2016-08-07 ENCOUNTER — Ambulatory Visit (HOSPITAL_BASED_OUTPATIENT_CLINIC_OR_DEPARTMENT_OTHER): Payer: BLUE CROSS/BLUE SHIELD | Admitting: Hematology and Oncology

## 2016-08-07 ENCOUNTER — Encounter: Payer: Self-pay | Admitting: *Deleted

## 2016-08-07 ENCOUNTER — Encounter: Payer: Self-pay | Admitting: Hematology and Oncology

## 2016-08-07 ENCOUNTER — Ambulatory Visit (HOSPITAL_BASED_OUTPATIENT_CLINIC_OR_DEPARTMENT_OTHER): Payer: BLUE CROSS/BLUE SHIELD

## 2016-08-07 ENCOUNTER — Other Ambulatory Visit (HOSPITAL_BASED_OUTPATIENT_CLINIC_OR_DEPARTMENT_OTHER): Payer: BLUE CROSS/BLUE SHIELD

## 2016-08-07 DIAGNOSIS — C50212 Malignant neoplasm of upper-inner quadrant of left female breast: Secondary | ICD-10-CM

## 2016-08-07 DIAGNOSIS — Z79811 Long term (current) use of aromatase inhibitors: Secondary | ICD-10-CM | POA: Diagnosis not present

## 2016-08-07 DIAGNOSIS — Z5112 Encounter for antineoplastic immunotherapy: Secondary | ICD-10-CM

## 2016-08-07 DIAGNOSIS — F419 Anxiety disorder, unspecified: Secondary | ICD-10-CM | POA: Diagnosis not present

## 2016-08-07 DIAGNOSIS — Z170 Estrogen receptor positive status [ER+]: Secondary | ICD-10-CM

## 2016-08-07 DIAGNOSIS — M8580 Other specified disorders of bone density and structure, unspecified site: Secondary | ICD-10-CM

## 2016-08-07 LAB — COMPREHENSIVE METABOLIC PANEL
ALBUMIN: 4 g/dL (ref 3.5–5.0)
ALK PHOS: 83 U/L (ref 40–150)
ALT: 30 U/L (ref 0–55)
ANION GAP: 9 meq/L (ref 3–11)
AST: 26 U/L (ref 5–34)
BUN: 19.6 mg/dL (ref 7.0–26.0)
CALCIUM: 9.8 mg/dL (ref 8.4–10.4)
CO2: 27 mEq/L (ref 22–29)
CREATININE: 1.1 mg/dL (ref 0.6–1.1)
Chloride: 100 mEq/L (ref 98–109)
EGFR: 56 mL/min/{1.73_m2} — AB (ref >90)
Glucose: 121 mg/dl (ref 70–140)
POTASSIUM: 4.4 meq/L (ref 3.5–5.1)
Sodium: 137 mEq/L (ref 136–145)
Total Bilirubin: 0.22 mg/dL (ref 0.20–1.20)
Total Protein: 7.5 g/dL (ref 6.4–8.3)

## 2016-08-07 LAB — CBC WITH DIFFERENTIAL/PLATELET
BASO%: 0.4 % (ref 0.0–2.0)
BASOS ABS: 0 10*3/uL (ref 0.0–0.1)
EOS ABS: 0.1 10*3/uL (ref 0.0–0.5)
EOS%: 1.3 % (ref 0.0–7.0)
HEMATOCRIT: 31.2 % — AB (ref 34.8–46.6)
HEMOGLOBIN: 10.1 g/dL — AB (ref 11.6–15.9)
LYMPH#: 1.5 10*3/uL (ref 0.9–3.3)
LYMPH%: 21.8 % (ref 14.0–49.7)
MCH: 28.6 pg (ref 25.1–34.0)
MCHC: 32.5 g/dL (ref 31.5–36.0)
MCV: 88.1 fL (ref 79.5–101.0)
MONO#: 0.5 10*3/uL (ref 0.1–0.9)
MONO%: 7.2 % (ref 0.0–14.0)
NEUT#: 4.8 10*3/uL (ref 1.5–6.5)
NEUT%: 69.3 % (ref 38.4–76.8)
PLATELETS: 302 10*3/uL (ref 145–400)
RBC: 3.54 10*6/uL — ABNORMAL LOW (ref 3.70–5.45)
RDW: 14.7 % — AB (ref 11.2–14.5)
WBC: 7 10*3/uL (ref 3.9–10.3)

## 2016-08-07 MED ORDER — ACETAMINOPHEN 325 MG PO TABS
ORAL_TABLET | ORAL | Status: AC
  Filled 2016-08-07: qty 2

## 2016-08-07 MED ORDER — SODIUM CHLORIDE 0.9% FLUSH
10.0000 mL | INTRAVENOUS | Status: DC | PRN
  Administered 2016-08-07: 10 mL
  Filled 2016-08-07: qty 10

## 2016-08-07 MED ORDER — DIPHENHYDRAMINE HCL 25 MG PO CAPS
ORAL_CAPSULE | ORAL | Status: AC
Start: 2016-08-07 — End: 2016-08-07
  Filled 2016-08-07: qty 2

## 2016-08-07 MED ORDER — SODIUM CHLORIDE 0.9 % IV SOLN
Freq: Once | INTRAVENOUS | Status: AC
  Administered 2016-08-07: 10:00:00 via INTRAVENOUS

## 2016-08-07 MED ORDER — DIPHENHYDRAMINE HCL 25 MG PO CAPS
50.0000 mg | ORAL_CAPSULE | Freq: Once | ORAL | Status: AC
  Administered 2016-08-07: 50 mg via ORAL

## 2016-08-07 MED ORDER — ACETAMINOPHEN 325 MG PO TABS
650.0000 mg | ORAL_TABLET | Freq: Once | ORAL | Status: AC
  Administered 2016-08-07: 650 mg via ORAL

## 2016-08-07 MED ORDER — HEPARIN SOD (PORK) LOCK FLUSH 100 UNIT/ML IV SOLN
500.0000 [IU] | Freq: Once | INTRAVENOUS | Status: AC | PRN
  Administered 2016-08-07: 500 [IU]
  Filled 2016-08-07: qty 5

## 2016-08-07 MED ORDER — TRASTUZUMAB CHEMO 150 MG IV SOLR
6.0000 mg/kg | Freq: Once | INTRAVENOUS | Status: AC
  Administered 2016-08-07: 420 mg via INTRAVENOUS
  Filled 2016-08-07: qty 20

## 2016-08-07 NOTE — Patient Instructions (Signed)
[facility] Discharge Instructions for Patients Receiving Chemotherapy  Today you received the following chemotherapy agents: Herceptin   To help prevent nausea and vomiting after your treatment, we encourage you to take your nausea medication as directed.    If you develop nausea and vomiting that is not controlled by your nausea medication, call the clinic.   BELOW ARE SYMPTOMS THAT SHOULD BE REPORTED IMMEDIATELY:  *FEVER GREATER THAN 100.5 F  *CHILLS WITH OR WITHOUT FEVER  NAUSEA AND VOMITING THAT IS NOT CONTROLLED WITH YOUR NAUSEA MEDICATION  *UNUSUAL SHORTNESS OF BREATH  *UNUSUAL BRUISING OR BLEEDING  TENDERNESS IN MOUTH AND THROAT WITH OR WITHOUT PRESENCE OF ULCERS  *URINARY PROBLEMS  *BOWEL PROBLEMS  UNUSUAL RASH Items with * indicate a potential emergency and should be followed up as soon as possible.  Feel free to call the clinic you have any questions or concerns. The clinic phone number is [phone].  Please show the CHEMO ALERT CARD at check-in to the Emergency Department and triage nurse.   

## 2016-08-07 NOTE — Assessment & Plan Note (Signed)
Left simple mastectomy with reconstruction 06/24/2015: IDC grade 3, 2.5 cm, with high-grade DCIS, ALH, LVI present, margins negative, 0/1 sentinel node negative, , ER 100%, PR 5%, HER-2 positive ratio 1.56 with average copy #8.25, Ki-67 20%  Pathologic stage:T2 N0 stage II a Treatment plan: 1. Adjuvant chemotherapy with TCHP X 6 completed 10/30/17followed by Herceptin maintenance for 1 year 2. Followed by adjuvant hormonal therapy with anastrozole started 11/28/2015 ---------------------------------------------------------------------------------------------------------- Current treatment: Maintenance Herceptin every 3 weeks until October 2018 Echocardiogram 05/22/16 LVEF 60-65% Anastrozole started 11/28/15  Anastrozole Toxicities: Denies any hot flashes or myalgias. Bone density test 04/12/2016: T score -2.2. Osteopenia. She is taking Fosamax and calcium and vitamin D.  Fosamax toxicities:Tolerating it well Anxiety: On antianxiety medications  I discussed results of PERSEPHONE  clinical trial which showed that 6 months of Herceptin is equivalent to one year of therapy. We discussed the pros and cons of continuation finishing her Herceptin versus stopping it at this time.

## 2016-08-07 NOTE — Progress Notes (Signed)
Patient Care Team: Theodoro Clock as PCP - General (General Practice) Sylvan Cheese, NP as Nurse Practitioner (Hematology and Oncology)  DIAGNOSIS:  Encounter Diagnosis  Name Primary?  . Malignant neoplasm of upper-inner quadrant of left breast in female, estrogen receptor positive ([place])     SUMMARY OF ONCOLOGIC HISTORY:   Breast cancer of upper-inner quadrant of left female breast ([place])   05/26/2015 Initial Diagnosis    Screen detected left breast asymmetry (posterior 1.6 cm): Grade 2-3 IDC ER/PR positive HER-2 positive Ki-67 20% plus calcs (span 6.1 cm): High-grade DCIS with suspicious foci of invasion (4.2 cm apart)      06/24/2015 Surgery    Left simple mastectomy with reconstruction: IDC grade 3, 2.5 cm, with high-grade DCIS, ALH, LVI present, margins negative, 0/1 sentinel node negative, T2 N0 stage II a, ER 100%, PR 5%, HER-2 positive ratio 1.56 with average copy #8.25, Ki-67 20%      07/25/2015 - 11/03/2015 Chemotherapy    Adjuvant chemotherapy with TCH Perjeta 6 cycles followed by Herceptin maintenance for 1 year      11/28/2015 -  Anti-estrogen oral therapy    Adjuvant anastrozole 1 mg daily       CHIEF COMPLIANT: Last dose of Herceptin  INTERVAL HISTORY: Heidi Walls is a seroma with above-mentioned history of breast cancer treated with mastectomy and reconstruction followed by adjuvant chemotherapy with Blanca followed by Herceptin maintenance. Today is her last dosage of Herceptin. She done extremely well. She is very excited to be completing her treatment. She is tolerating anastrozole extremely well. She had mild hot flashes initially which went away later on.  REVIEW OF SYSTEMS:   Constitutional: Denies fevers, chills or abnormal weight loss Eyes: Denies blurriness of vision Ears, nose, mouth, throat, and face: Denies mucositis or sore throat Respiratory: Denies cough, dyspnea or wheezes Cardiovascular: Denies palpitation, chest  discomfort Gastrointestinal:  Denies nausea, heartburn or change in bowel habits Skin: Denies abnormal skin rashes Lymphatics: Denies new lymphadenopathy or easy bruising Neurological:Denies numbness, tingling or new weaknesses Behavioral/Psych: Mood is stable, no new changes  Extremities: No lower extremity edema Breast:  denies any pain or lumps or nodules in either breasts All other systems were reviewed with the patient and are negative.  I have reviewed the past medical history, past surgical history, social history and family history with the patient and they are unchanged from previous note.  ALLERGIES:  is allergic to codeine.  MEDICATIONS:  Current Outpatient Prescriptions  Medication Sig Dispense Refill  . albuterol (PROVENTIL HFA;VENTOLIN HFA) 108 (90 Base) MCG/ACT inhaler Inhale 2 puffs into the lungs every 6 (six) hours as needed for wheezing or shortness of breath. 1 Inhaler 0  . alendronate (FOSAMAX) 70 MG tablet Take 1 tablet (70 mg total) by mouth once a week. Take with a full glass of water on an empty stomach. 12 tablet 3  . ALPRAZolam (XANAX) 0.25 MG tablet Take 1 tablet (0.25 mg total) by mouth 3 (three) times daily as needed. 90 tablet 2  . amLODipine (NORVASC) 5 MG tablet Take 1 tablet (5 mg total) by mouth daily. 90 tablet 3  . anastrozole (ARIMIDEX) 1 MG tablet Take 1 tablet (1 mg total) by mouth daily. 90 tablet 3  . Ascorbic Acid (VITAMIN C) 1000 MG tablet Take 1,000 mg by mouth daily.    Marland Kitchen azithromycin (ZITHROMAX Z-PAK) 250 MG tablet Take as directed 6 each 0  . bifidobacterium infantis (ALIGN) capsule Take 1 capsule  by mouth daily. 60 capsule   . Biotin (BIOTIN 5000) 5 MG CAPS Take 5,000 mg by mouth daily.    . calcium carbonate (TUMS - DOSED IN MG ELEMENTAL CALCIUM) 500 MG chewable tablet Chew 3 tablets by mouth 2 (two) times daily.    . chlorpheniramine-HYDROcodone (TUSSIONEX) 10-8 MG/5ML SUER Take 5 mLs by mouth daily.  0  . citalopram (CELEXA) 20 MG tablet  TAKE 1 TABLET BY MOUTH EVERY DAY 30 tablet 2  . cyclobenzaprine (FLEXERIL) 10 MG tablet TAKE 1 TABLET (10 MG TOTAL) BY MOUTH 3 (THREE) TIMES DAILY. 60 tablet 2  . esomeprazole (NEXIUM) 20 MG capsule Take 20 mg by mouth 2 (two) times daily before a meal.     . fluconazole (DIFLUCAN) 150 MG tablet Take 1 tablet (150 mg total) by mouth daily. 4 tablet 2  . fluticasone furoate-vilanterol (BREO ELLIPTA) 100-25 MCG/INH AEPB Inhale 1 puff into the lungs daily. 1 each 11  . lidocaine-prilocaine (EMLA) cream Apply to affected area once 30 g 3  . meloxicam (MOBIC) 7.5 MG tablet Take 1 tablet (7.5 mg total) by mouth daily. 30 tablet 5  . Multiple Vitamin (MULTIVITAMIN) tablet Take 1 tablet by mouth daily.    . Omega-3 Fatty Acids (FISH OIL) 1200 MG CAPS Take 1,200 mg by mouth daily.    . prochlorperazine (COMPAZINE) 10 MG tablet Take 1 tablet (10 mg total) by mouth every 6 (six) hours as needed (Nausea or vomiting). 30 tablet 1  . valACYclovir (VALTREX) 1000 MG tablet Take 1 tablet (1,000 mg total) by mouth 2 (two) times daily. 20 tablet 6   No current facility-administered medications for this visit.     PHYSICAL EXAMINATION: ECOG PERFORMANCE STATUS: 1 - Symptomatic but completely ambulatory  Vitals:   08/07/16 0901  BP: (!) 170/68  Pulse: 98  Resp: 20  Temp: 98.3 F (36.8 C)   Filed Weights   08/07/16 0901  Weight: 153 lb 3.2 oz (69.5 kg)    GENERAL:alert, no distress and comfortable SKIN: skin color, texture, turgor are normal, no rashes or significant lesions EYES: normal, Conjunctiva are pink and non-injected, sclera clear OROPHARYNX:no exudate, no erythema and lips, buccal mucosa, and tongue normal  NECK: supple, thyroid normal size, non-tender, without nodularity LYMPH:  no palpable lymphadenopathy in the cervical, axillary or inguinal LUNGS: clear to auscultation and percussion with normal breathing effort HEART: regular rate & rhythm and no murmurs and no lower extremity  edema ABDOMEN:abdomen soft, non-tender and normal bowel sounds MUSCULOSKELETAL:no cyanosis of digits and no clubbing  NEURO: alert & oriented x 3 with fluent speech, no focal motor/sensory deficits EXTREMITIES: No lower extremity edema  LABORATORY DATA:  I have reviewed the data as listed   Chemistry      Component Value Date/Time   NA 135 (L) 07/17/2016 0849   K 4.3 07/17/2016 0849   CO2 26 07/17/2016 0849   BUN 22.6 07/17/2016 0849   CREATININE 1.1 07/17/2016 0849      Component Value Date/Time   CALCIUM 10.1 07/17/2016 0849   ALKPHOS 83 07/17/2016 0849   AST 21 07/17/2016 0849   ALT 24 07/17/2016 0849   BILITOT 0.28 07/17/2016 0849       Lab Results  Component Value Date   WBC 7.0 08/07/2016   HGB 10.1 (L) 08/07/2016   HCT 31.2 (L) 08/07/2016   MCV 88.1 08/07/2016   PLT 302 08/07/2016   NEUTROABS 4.8 08/07/2016    ASSESSMENT & PLAN:  Breast cancer of  upper-inner quadrant of left female breast ([place]) Left simple mastectomy with reconstruction 06/24/2015: IDC grade 3, 2.5 cm, with high-grade DCIS, ALH, LVI present, margins negative, 0/1 sentinel node negative, , ER 100%, PR 5%, HER-2 positive ratio 1.56 with average copy #8.25, Ki-67 20%  Pathologic stage:T2 N0 stage II a Treatment plan: 1. Adjuvant chemotherapy with TCHP X 6 completed 10/30/17followed by Herceptin maintenance for 1 year 2. Followed by adjuvant hormonal therapy with anastrozole started 11/28/2015 ---------------------------------------------------------------------------------------------------------- Current treatment: Maintenance Herceptin every 3 weeks Completed 08/07/2016 Echocardiogram 05/22/16 LVEF 60-65% Anastrozole started 11/28/15  Anastrozole Toxicities: Denies any hot flashes or myalgias. Bone density test 04/12/2016: T score -2.2. Osteopenia. She is taking Fosamax and calcium and vitamin D.  Fosamax toxicities:Tolerating it well Anxiety: On antianxiety medications  Return to  clinic in 3 months with survivorship plan plan visit.   I spent 25 minutes talking to the patient of which more than half was spent in counseling and coordination of care.  No orders of the defined types were placed in this encounter.  The patient has a good understanding of the overall plan. she agrees with it. she will call with any problems that may develop before the next visit here.   Rulon Eisenmenger, MD 08/07/16

## 2016-08-09 ENCOUNTER — Telehealth: Payer: Self-pay | Admitting: Hematology and Oncology

## 2016-08-09 NOTE — Telephone Encounter (Signed)
sw pt to confirm SCP appt 11/2 at 10 am per sch msg

## 2016-08-13 ENCOUNTER — Other Ambulatory Visit: Payer: Self-pay | Admitting: Physician Assistant

## 2016-08-13 DIAGNOSIS — J40 Bronchitis, not specified as acute or chronic: Secondary | ICD-10-CM

## 2016-08-13 MED ORDER — AZITHROMYCIN 250 MG PO TABS: ORAL_TABLET | ORAL | 0 refills | Status: DC

## 2016-08-13 MED ORDER — OLOPATADINE HCL 0.1 % OP SOLN: 1.0000 [drp] | Freq: Two times a day (BID) | OPHTHALMIC | 12 refills | Status: DC

## 2016-08-13 MED ORDER — PREDNISONE 10 MG (21) PO TBPK: ORAL_TABLET | ORAL | 0 refills | Status: DC

## 2016-08-19 ENCOUNTER — Other Ambulatory Visit: Payer: Self-pay | Admitting: Physician Assistant

## 2016-08-22 ENCOUNTER — Telehealth: Payer: Self-pay | Admitting: Physician Assistant

## 2016-08-22 MED ORDER — HYDROCHLOROTHIAZIDE 25 MG PO TABS: 25.0000 mg | ORAL_TABLET | Freq: Every day | ORAL | 3 refills | Status: DC

## 2016-08-22 NOTE — Telephone Encounter (Signed)
sent 

## 2016-08-28 ENCOUNTER — Ambulatory Visit (HOSPITAL_COMMUNITY)
Admission: RE | Admit: 2016-08-28 | Discharge: 2016-08-28 | Disposition: A | Discharge status: Home / Self Care | Payer: BLUE CROSS/BLUE SHIELD | Source: Ambulatory Visit | Attending: Cardiology | Admitting: Cardiology

## 2016-08-28 ENCOUNTER — Ambulatory Visit (HOSPITAL_BASED_OUTPATIENT_CLINIC_OR_DEPARTMENT_OTHER)
Admission: RE | Admit: 2016-08-28 | Discharge: 2016-08-28 | Disposition: A | Discharge status: Home / Self Care | Payer: BLUE CROSS/BLUE SHIELD | Source: Ambulatory Visit | Attending: Cardiology | Admitting: Cardiology

## 2016-08-28 ENCOUNTER — Encounter (HOSPITAL_COMMUNITY): Payer: Self-pay | Admitting: Cardiology

## 2016-08-28 VITALS — BP 171/68 | HR 96 | Wt 159.0 lb

## 2016-08-28 DIAGNOSIS — Z87891 Personal history of nicotine dependence: Secondary | ICD-10-CM | POA: Diagnosis not present

## 2016-08-28 DIAGNOSIS — Z79811 Long term (current) use of aromatase inhibitors: Secondary | ICD-10-CM | POA: Insufficient documentation

## 2016-08-28 DIAGNOSIS — Z803 Family history of malignant neoplasm of breast: Secondary | ICD-10-CM | POA: Diagnosis not present

## 2016-08-28 DIAGNOSIS — I10 Essential (primary) hypertension: Secondary | ICD-10-CM | POA: Insufficient documentation

## 2016-08-28 DIAGNOSIS — C50212 Malignant neoplasm of upper-inner quadrant of left female breast: Secondary | ICD-10-CM

## 2016-08-28 DIAGNOSIS — Z833 Family history of diabetes mellitus: Secondary | ICD-10-CM | POA: Insufficient documentation

## 2016-08-28 DIAGNOSIS — Z8249 Family history of ischemic heart disease and other diseases of the circulatory system: Secondary | ICD-10-CM | POA: Diagnosis not present

## 2016-08-28 DIAGNOSIS — Z79899 Other long term (current) drug therapy: Secondary | ICD-10-CM | POA: Diagnosis not present

## 2016-08-28 DIAGNOSIS — Z7983 Long term (current) use of bisphosphonates: Secondary | ICD-10-CM | POA: Insufficient documentation

## 2016-08-28 DIAGNOSIS — Z9012 Acquired absence of left breast and nipple: Secondary | ICD-10-CM | POA: Diagnosis not present

## 2016-08-28 DIAGNOSIS — K219 Gastro-esophageal reflux disease without esophagitis: Secondary | ICD-10-CM | POA: Diagnosis not present

## 2016-08-28 LAB — ECHOCARDIOGRAM COMPLETE
CHL CUP DOP CALC LVOT VTI: 32.6 cm
E decel time: 162 msec
E/e' ratio: 13.27
FS: 27 % — AB (ref 28–44)
IV/PV OW: 1.02
LA diam end sys: 34 mm
LA vol index: 26.4 mL/m2
LADIAMINDEX: 1.99 cm/m2
LASIZE: 34 mm
LAVOL: 45.2 mL
LAVOLA4C: 44.9 mL
LV E/e' medial: 13.27
LV E/e'average: 13.27
LV e' LATERAL: 11 cm/s
LVOT area: 2.84 cm2
LVOT peak grad rest: 12 mmHg
LVOT peak vel: 175 cm/s
LVOTD: 19 mm
LVOTSV: 93 mL
MV Dec: 162
MVPG: 9 mmHg
MVPKAVEL: 130 m/s
MVPKEVEL: 146 m/s
PW: 8.39 mm — AB (ref 0.6–1.1)
RV LATERAL S' VELOCITY: 17.3 cm/s
RV TAPSE: 23.8 mm
TDI e' lateral: 11
TDI e' medial: 8.59

## 2016-08-28 NOTE — Progress Notes (Signed)
  Echocardiogram 2D Echocardiogram has been performed.  Heidi Walls 08/28/2016, 10:55 AM

## 2016-08-29 DIAGNOSIS — Z853 Personal history of malignant neoplasm of breast: Secondary | ICD-10-CM | POA: Diagnosis not present

## 2016-08-29 DIAGNOSIS — Z9012 Acquired absence of left breast and nipple: Secondary | ICD-10-CM | POA: Diagnosis not present

## 2016-08-29 NOTE — Progress Notes (Signed)
Patient ID: Heidi Walls, female   DOB: 1955-04-28, 61 y.o.   MRN: [id number] Oncologist: Dr. Pamelia Hoit  61 yo with history of HTN, GERD, and breast cancer presents for cardio-oncology clinic evaluation.  She had left mastectomy in 6/17.  ER+/PR+/HER2+ tumor.  She had TCHP adjuvant chemo followed by Herceptin alone for total of 1 year Herceptin-based therapy, now completed.      She has no history of cardiac problems.  She is not a smoker.    She had a nonischemic Cardiolite in 10/17 due to atypical chest pain.  No chest pain since that time.   Generally doing well with no exertional dyspnea.  PMH: 1. HTN 2. GERD 3. Breast cancer: Left mastectomy in 6/17.  ER+/PR+/HER2+ tumor.  She will have TCHP adjuvant chemo followed by Herceptin alone for total of 1 year Herceptin-based therapy.   - Echo (7/17): EF 60-65%, lateral s' 12.4, GLS -23.7%, grade II diastolic dysfunction.  - Echo (10/17): EF 60-65%, lateral s' 10.1, GLS -23.4%, normal RV size and systolic function.  - Echo (2/18): EF 60-65%, normal diastolic function, unable to measure strain accurately due to breast implant, RV normal size/systolic function.  - Echo (5/18): EF 60-65%, GLS -23.6%.  - Echo (8/18): EF 60-65%, GLS -21.1%. 4. Chest pain: Cardiolite (10/17) with EF 61%, fixed apical defect likely attenuation, no ischemia.   Social History   Social History  . Marital status: Married    Spouse name: N/A  . Number of children: N/A  . Years of education: N/A   Occupational History  . Not on file.   Social History Main Topics  . Smoking status: Former Games developer  . Smokeless tobacco: Never Used  . Alcohol use Yes     Comment: social  . Drug use: No  . Sexual activity: Not on file   Other Topics Concern  . Not on file   Social History Narrative  . No narrative on file   Family History  Problem Relation Age of Onset  . Heart failure Mother   . Diabetes Mother   . Hypertension Mother   . Hypertension Father   . Breast  cancer Maternal Grandmother    ROS: All systems reviewed and negative except as per HPI.   Current Outpatient Prescriptions  Medication Sig Dispense Refill  . albuterol (PROVENTIL HFA;VENTOLIN HFA) 108 (90 Base) MCG/ACT inhaler Inhale 2 puffs into the lungs every 6 (six) hours as needed for wheezing or shortness of breath. 1 Inhaler 0  . alendronate (FOSAMAX) 70 MG tablet Take 1 tablet (70 mg total) by mouth once a week. Take with a full glass of water on an empty stomach. 12 tablet 3  . ALPRAZolam (XANAX) 0.25 MG tablet Take 1 tablet (0.25 mg total) by mouth 3 (three) times daily as needed. 90 tablet 2  . amLODipine (NORVASC) 5 MG tablet Take 1 tablet (5 mg total) by mouth daily. 90 tablet 3  . anastrozole (ARIMIDEX) 1 MG tablet Take 1 tablet (1 mg total) by mouth daily. 90 tablet 3  . Ascorbic Acid (VITAMIN C) 1000 MG tablet Take 1,000 mg by mouth daily.    . bifidobacterium infantis (ALIGN) capsule Take 1 capsule by mouth daily. 60 capsule   . Biotin (BIOTIN 5000) 5 MG CAPS Take 5,000 mg by mouth daily.    . calcium carbonate (TUMS - DOSED IN MG ELEMENTAL CALCIUM) 500 MG chewable tablet Chew 3 tablets by mouth 2 (two) times daily.    . chlorpheniramine-HYDROcodone (TUSSIONEX)  10-8 MG/5ML SUER Take 5 mLs by mouth daily.  0  . citalopram (CELEXA) 20 MG tablet TAKE 1 TABLET BY MOUTH EVERY DAY 30 tablet 2  . cyclobenzaprine (FLEXERIL) 10 MG tablet TAKE 1 TABLET (10 MG TOTAL) BY MOUTH 3 (THREE) TIMES DAILY. 60 tablet 2  . esomeprazole (NEXIUM) 20 MG capsule Take 20 mg by mouth 2 (two) times daily before a meal.     . fluconazole (DIFLUCAN) 150 MG tablet Take 1 tablet (150 mg total) by mouth daily. 4 tablet 2  . fluticasone furoate-vilanterol (BREO ELLIPTA) 100-25 MCG/INH AEPB Inhale 1 puff into the lungs daily. 1 each 11  . hydrochlorothiazide (HYDRODIURIL) 25 MG tablet Take 1 tablet (25 mg total) by mouth daily. 90 tablet 3  . lidocaine-prilocaine (EMLA) cream Apply to affected area once 30 g 3   . lisinopril (PRINIVIL,ZESTRIL) 5 MG tablet Take 1 tablet by mouth daily.  3  . meloxicam (MOBIC) 7.5 MG tablet Take 1 tablet (7.5 mg total) by mouth daily. 30 tablet 5  . Multiple Vitamin (MULTIVITAMIN) tablet Take 1 tablet by mouth daily.    Marland Kitchen olopatadine (PATANOL) 0.1 % ophthalmic solution Place 1 drop into both eyes 2 (two) times daily. 5 mL 12  . Omega-3 Fatty Acids (FISH OIL) 1200 MG CAPS Take 1,200 mg by mouth daily.    . prochlorperazine (COMPAZINE) 10 MG tablet Take 1 tablet (10 mg total) by mouth every 6 (six) hours as needed (Nausea or vomiting). 30 tablet 1  . valACYclovir (VALTREX) 1000 MG tablet Take 1 tablet (1,000 mg total) by mouth 2 (two) times daily. 20 tablet 6   No current facility-administered medications for this encounter.    BP (!) 171/68   Pulse 96   Wt 159 lb (72.1 kg)   SpO2 100%   BMI 29.08 kg/m  General: NAD Neck: No JVD, no thyromegaly or thyroid nodule.  Lungs: Clear to auscultation bilaterally with normal respiratory effort. CV: Nondisplaced PMI.  Heart regular S1/S2, no S3/S4, no murmur.  No peripheral edema.  No carotid bruit.  Normal pedal pulses.  Abdomen: Soft, nontender, no hepatosplenomegaly, no distention.  Skin: Intact without lesions or rashes.  Neurologic: Alert and oriented x 3.  Psych: Normal affect. Extremities: No clubbing or cyanosis.  HEENT: Normal.   Assessment/Plan: 1. Breast cancer:  Echo from today was reviewed, EF is normal and strain remains stable. She has completed Herceptin therapy.  She will not need further echoes.  She can followup in our office prn.  2. Chest pain: Nonischemic Cardiolite in 10/17.  No further chest pain.     Loralie Champagne 08/29/2016

## 2016-09-05 DIAGNOSIS — Z452 Encounter for adjustment and management of vascular access device: Secondary | ICD-10-CM | POA: Diagnosis not present

## 2016-09-05 HISTORY — PX: OTHER SURGICAL HISTORY: SHX169

## 2016-09-22 ENCOUNTER — Other Ambulatory Visit: Payer: Self-pay | Admitting: Physician Assistant

## 2016-09-22 DIAGNOSIS — J309 Allergic rhinitis, unspecified: Secondary | ICD-10-CM

## 2016-09-22 MED ORDER — PREDNISONE 10 MG (48) PO TBPK: ORAL_TABLET | ORAL | 0 refills | Status: DC

## 2016-09-25 ENCOUNTER — Other Ambulatory Visit: Payer: Self-pay | Admitting: Physician Assistant

## 2016-09-26 ENCOUNTER — Encounter: Payer: Self-pay | Admitting: Family Medicine

## 2016-09-26 ENCOUNTER — Ambulatory Visit (INDEPENDENT_AMBULATORY_CARE_PROVIDER_SITE_OTHER): Payer: BLUE CROSS/BLUE SHIELD | Admitting: Family Medicine

## 2016-09-26 ENCOUNTER — Other Ambulatory Visit: Payer: Self-pay | Admitting: Physician Assistant

## 2016-09-26 VITALS — BP 153/79 | HR 91 | Temp 97.8°F | Ht 62.0 in

## 2016-09-26 DIAGNOSIS — R490 Dysphonia: Secondary | ICD-10-CM

## 2016-09-26 DIAGNOSIS — J40 Bronchitis, not specified as acute or chronic: Secondary | ICD-10-CM

## 2016-09-26 DIAGNOSIS — R946 Abnormal results of thyroid function studies: Secondary | ICD-10-CM | POA: Diagnosis not present

## 2016-09-26 NOTE — Patient Instructions (Signed)
As we discussed, I do think that it is pertinent that he see an ear nose and throat doctor given how long you have had this hoarseness. Additionally, I'm going to screen you for possible thyroid disorder. Your exam did not reveal any acute abnormalities today. Though I think you would benefit from direct visualization of your vocal cords. I will contact you with the results of your blood labs.  Plan to follow up with Particia Nearing wants you have seen ENT or sooner if needed.   Hoarseness Hoarseness is any abnormal change in your voice.Hoarseness can make it difficult to speak. Your voice may sound raspy, breathy, or strained. Hoarseness is caused by a problem with the vocal cords. The vocal cords are two bands of tissue inside your voice box (larynx). When you speak, your vocal cords move back and forth to create sound. The surfaces of your vocal cords need to be smooth for your voice to sound clear. Swelling or lumps on the vocal cords can cause hoarseness. Common causes of vocal cord problems include:  Upper airway infection.  A long-term cough.  Straining or overusing your voice.  Smoking.  Allergies.  Vocal cord growths.  Stomach acids that flow up from your stomach and irritate your vocal cords (gastroesophageal reflux).  Follow these instructions at home: Watch your condition for any changes. To ease any discomfort that you feel:  Rest your voice. Do not whisper. Whispering can cause muscle strain.  Do not speak in a loud or harsh voice that makes your hoarseness worse.  Do not use any tobacco products, including cigarettes, chewing tobacco, or electronic cigarettes. If you need help quitting, ask your health care provider.  Avoid secondhand smoke.  Do not eat foods that give you heartburn. Heartburn can make gastroesophageal reflux worse.  Do not drink coffee.  Do not drink alcohol.  Drink enough fluids to keep your urine clear or pale yellow.  Use a humidifier if the  air in your home is dry.  Contact a health care provider if:  You have hoarseness that lasts longer than 3 weeks.  You almost lose or completelylose your voice for longer than 3 days.  You have pain when you swallow or try to talk.  You feel a lump in your neck. Get help right away if:  You have trouble swallowing.  You feel as though you are choking when you swallow.  You cough up blood or vomit blood.  You have trouble breathing. This information is not intended to replace advice given to you by your health care provider. Make sure you discuss any questions you have with your health care provider. Document Released: 12/08/2004 Document Revised: 06/02/2015 Document Reviewed: 12/16/2013 Elsevier Interactive Patient Education  [employer].

## 2016-09-26 NOTE — Progress Notes (Signed)
Subjective: VE:HMCNOB voice PCP: Terald Sleeper, PA-C [id number] Heidi Walls is a 61 y.o. female, who is accompanied by her sister-in-law today. She is presenting to clinic today for:  1. Losing voice Patient reports that hoarseness started about 5 months ago.  She reports having a history of persistent cough around this time. She is now on inhalers and cough has improved for the most part. She continues to have intermittent coughing spells particularly after lunch time. She endorses some rhinorrhea and allergy symptoms. She is on an oral antihistamine for this. She recently was prescribed a prednisone taper, which she notes has not helped substantially. She reports associated intermittent dysphasia. She notes that it takes her about 3 times to swallow solids sometimes. Denies difficulty swallowing fluids. No neck masses or tenderness.  She notes that she was actually already referred to ENT for this, but that her symptoms had slightly improved and she thought she was getting better and therefore canceled. She does have an appointment to see an allergist. She has a history of reflux for which she takes Nexium daily. She is compliant with this medication.  Additionally, she does have a history of breast cancer. She completed chemotherapy for this in July of this year.  No h/o thyroid disorder, COPD or asthma. She is a former smoker and quit over 10 years ago. No fevers, chills, recent URI. No excessive voice use. She does work at [employer] but nothing has changed. No trauma but she has been intubated in the past for procedures for breast cancer.   Allergies  Allergen Reactions  . Codeine Shortness Of Breath    Pt reports this was 30 years ago.   Past Medical History:  Diagnosis Date  . Anxiety    OCD  . Arthritis    knees and hips  . Breast cancer ([place])   . Breast cancer of upper-inner quadrant of left female breast ([place]) 05/26/2015  . GERD (gastroesophageal reflux disease)   . Hypertension   .  Insomnia    Family History  Problem Relation Age of Onset  . Heart failure Mother   . Diabetes Mother   . Hypertension Mother   . Hypertension Father   . Breast cancer Maternal Grandmother    Social Hx: former smoker.Current medications reviewed.   ROS: Per HPI  Objective: Office vital signs reviewed. BP (!) 153/79   Pulse 91   Temp 97.8 F (36.6 C) (Oral)   Ht 5\' 2"  (1.575 m)   Physical Examination:  General: Awake, alert, well nourished, well appearing female, No acute distress HEENT: Normal    Neck: No masses palpated. No lymphadenopathy; thyroid not palpable.  No palpable nodules.    Ears: Tympanic membranes intact, normal light reflex, no erythema, no bulging    Eyes: PERRLA, exotropia of the left eye noted, sclera white    Nose: nasal turbinates moist, clear nasal discharge    Throat: moist mucus membranes, no erythema, no tonsillar exudate.  Airway is patent Cardio: regular rate and rhythm, S1S2 heard, no murmurs appreciated Pulm: clear to auscultation bilaterally, no wheezes, rhonchi or rales; normal work of breathing on room air Neuro: CN 2-12 grossly in tact.  Assessment/ Plan: 61 y.o. female   1. Persistent hoarseness Given the duration of hoarseness, patient has been referred to ENT for direct visualization of the vocal cords. I did discuss with patient possible differential diagnoses, including: vocal cord paralysis, vocal cord polyp or mass, vocal cord swelling. I ordered a TSH to evaluate  for possible thyroid involvement. 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 At this time, I doubt CVA She is currently on a PPI, making GERD etiology less likely. She is also on an antihistamine, making allergy etiology less likely at this time. She seems refractory to oral steroids. She does have a past medical history significant for breast cancer and she is a former smoker. Laryngeal Malignancy is not excluded. Strict return  precautions were reviewed with patient. She voiced good understanding. I will contact her with the results of her labs. She will follow up with her primary care physician after evaluation by ENT or as the need arises.   - Ambulatory referral to ENT - TSH   Orders Placed This Encounter  Procedures  . TSH  . Ambulatory referral to ENT    Referral Priority:   Routine    Referral Type:   Consultation    Referral Reason:   Specialty Services Required    Requested Specialty:   Otolaryngology    Number of Visits Requested:   [place], [place] [phone]

## 2016-09-27 ENCOUNTER — Other Ambulatory Visit: Payer: Self-pay | Admitting: Physician Assistant

## 2016-09-27 LAB — TSH: TSH: 0.358 u[IU]/mL — ABNORMAL LOW (ref 0.450–4.500)

## 2016-09-27 MED ORDER — DESOXIMETASONE 0.05 % EX CREA: TOPICAL_CREAM | Freq: Two times a day (BID) | CUTANEOUS | 6 refills | Status: DC

## 2016-09-28 LAB — THYROGLOBULIN ANTIBODY: Thyroglobulin Antibody: <1 IU/mL (ref 0.0–0.9)

## 2016-09-28 LAB — T3: T3 TOTAL: 87 ng/dL (ref 71–180)

## 2016-09-28 LAB — T4, FREE: FREE T4: 1.08 ng/dL (ref 0.82–1.77)

## 2016-09-28 LAB — T3, FREE: T3, Free: 2.4 pg/mL (ref 2.0–4.4)

## 2016-09-28 LAB — SPECIMEN STATUS REPORT

## 2016-10-03 ENCOUNTER — Other Ambulatory Visit: Payer: Self-pay | Admitting: *Deleted

## 2016-10-03 DIAGNOSIS — R490 Dysphonia: Secondary | ICD-10-CM | POA: Insufficient documentation

## 2016-10-03 DIAGNOSIS — J383 Other diseases of vocal cords: Secondary | ICD-10-CM | POA: Diagnosis not present

## 2016-10-03 DIAGNOSIS — Z853 Personal history of malignant neoplasm of breast: Secondary | ICD-10-CM | POA: Diagnosis not present

## 2016-10-03 DIAGNOSIS — R1310 Dysphagia, unspecified: Secondary | ICD-10-CM | POA: Diagnosis not present

## 2016-10-03 DIAGNOSIS — J343 Hypertrophy of nasal turbinates: Secondary | ICD-10-CM | POA: Diagnosis not present

## 2016-10-03 MED ORDER — CITALOPRAM HYDROBROMIDE 20 MG PO TABS: 20.0000 mg | ORAL_TABLET | Freq: Every day | ORAL | 0 refills | Status: DC

## 2016-10-04 ENCOUNTER — Telehealth: Payer: Self-pay

## 2016-10-04 ENCOUNTER — Other Ambulatory Visit: Payer: Self-pay | Admitting: Otolaryngology

## 2016-10-04 NOTE — Telephone Encounter (Signed)
Insurance denied prior auth for Desoximetasone cream   Alternatives are Betamethasone dipropionate cream/ointment, Desoximetasone crea, Fluocinolone cream/ointment

## 2016-10-05 MED ORDER — BETAMETHASONE DIPROPIONATE 0.05 % EX CREA: TOPICAL_CREAM | Freq: Two times a day (BID) | CUTANEOUS | 5 refills | Status: DC

## 2016-10-05 NOTE — Addendum Note (Signed)
Addended by: Terald Sleeper on: 10/05/2016 10:15 AM   Modules accepted: Orders

## 2016-10-08 NOTE — Pre-Procedure Instructions (Signed)
ILAYDA TODA  10/08/2016      [id number] [place] [place] [address] Phone: [phone] Fax: [phone]  [id number] [place] [place] - [address] [place] [address] Phone: [phone] Fax: [phone]    Your procedure is scheduled on Oct 8.  Report to [facility] Admitting at 845 A.M.  Call this number if you have problems the morning of surgery:  [phone]   Remember:  Do not eat food or drink liquids after midnight.  Take these medicines the morning of surgery with A SIP OF WATER Proventil inhaler if needed, amlodipine (Norvasc), anastrozole (Arimidex),  Citalopram (Celexa), Cyclobenzaprine (Flexeril) if needed, esomeprazole (Nexium), Breo ellipta inhaler, Valacyclovir (Valtrex) if needed   Bring your inhalers with you on the day of surgery.  Stop taking aspirin, BC's, Goody's, Herbal medications, Fish Oil, Aleve, Ibuprofen, Advil, Motrin, Meloxicam (Mobic)   Do not wear jewelry, make-up or nail polish.  Do not wear lotions, powders, or perfumes, or deoderant.  Do not shave 48 hours prior to surgery.  Men may shave face and neck.  Do not bring valuables to the hospital.  [facility] is not responsible for any belongings or valuables.  Contacts, dentures or bridgework may not be worn into surgery.  Leave your suitcase in the car.  After surgery it may be brought to your room.  For patients admitted to the hospital, discharge time will be determined by your treatment team.  Patients discharged the day of surgery will not be allowed to drive home. Special instructions:  [place] - Preparing for Surgery  Before surgery, you can play an important role.  Because skin is not sterile, your skin needs to be as free of germs as possible.  You can reduce the number of germs on you skin by washing with CHG (chlorahexidine  gluconate) soap before surgery.  CHG is an antiseptic cleaner which kills germs and bonds with the skin to continue killing germs even after washing.  Please DO NOT use if you have an allergy to CHG or antibacterial soaps.  If your skin becomes reddened/irritated stop using the CHG and inform your nurse when you arrive at Short Stay.  Do not shave (including legs and underarms) for at least 48 hours prior to the first CHG shower.  You may shave your face.  Please follow these instructions carefully:   1.  Shower with CHG Soap the night before surgery and the  morning of Surgery.  2.  If you choose to wash your hair, wash your hair first as usual with your  normal shampoo.  3.  After you shampoo, rinse your hair and body thoroughly to remove the   Shampoo.  4.  Use CHG as you would any other liquid soap.  You can apply chg directly       to the skin and wash gently with scrungie or a clean washcloth.  5.  Apply the CHG Soap to your body ONLY FROM THE NECK DOWN.    Do not use on open wounds or open sores.  Avoid contact with your eyes,       ears, mouth and genitals (private parts).  Wash genitals (private parts) with your normal soap.  6.  Wash thoroughly, paying special attention to the area where your surgery  will be performed.  7.  Thoroughly rinse your body with warm water from the neck down.  8.  DO NOT shower/wash with your normal soap after using and rinsing off  the CHG Soap.  9.  Pat yourself dry with a clean towel.            10.  Wear clean pajamas.            11.  Place clean sheets on your bed the night of your first shower and do not sleep with pets.  Day of Surgery  Do not apply any lotions/deoderants the morning of surgery.  Please wear clean clothes to the hospital/surgery center.     Please read over the following fact sheets that you were given. Pain Booklet, Coughing and Deep Breathing and Surgical Site Infection Prevention

## 2016-10-09 ENCOUNTER — Encounter (HOSPITAL_COMMUNITY): Payer: Self-pay

## 2016-10-09 ENCOUNTER — Encounter (HOSPITAL_COMMUNITY)
Admission: RE | Admit: 2016-10-09 | Discharge: 2016-10-09 | Disposition: A | Discharge status: Home / Self Care | Payer: BLUE CROSS/BLUE SHIELD | Source: Ambulatory Visit | Attending: Otolaryngology | Admitting: Otolaryngology

## 2016-10-09 DIAGNOSIS — J3800 Paralysis of vocal cords and larynx, unspecified: Secondary | ICD-10-CM | POA: Insufficient documentation

## 2016-10-09 DIAGNOSIS — Z01812 Encounter for preprocedural laboratory examination: Secondary | ICD-10-CM | POA: Insufficient documentation

## 2016-10-09 HISTORY — DX: Unspecified asthma, uncomplicated: J45.909

## 2016-10-09 HISTORY — DX: Anemia, unspecified: D64.9

## 2016-10-09 LAB — BASIC METABOLIC PANEL
ANION GAP: 8 (ref 5–15)
BUN: 19 mg/dL (ref 6–20)
CHLORIDE: 100 mmol/L — AB (ref 101–111)
CO2: 27 mmol/L (ref 22–32)
Calcium: 9 mg/dL (ref 8.9–10.3)
Creatinine, Ser: 0.91 mg/dL (ref 0.44–1.00)
GFR calc Af Amer: >60 mL/min (ref >60)
Glucose, Bld: 107 mg/dL — ABNORMAL HIGH (ref 65–99)
POTASSIUM: 3.9 mmol/L (ref 3.5–5.1)
SODIUM: 135 mmol/L (ref 135–145)

## 2016-10-09 LAB — CBC
HCT: 30.7 % — ABNORMAL LOW (ref 36.0–46.0)
HEMOGLOBIN: 9.7 g/dL — AB (ref 12.0–15.0)
MCH: 27.9 pg (ref 26.0–34.0)
MCHC: 31.6 g/dL (ref 30.0–36.0)
MCV: 88.2 fL (ref 78.0–100.0)
PLATELETS: 269 10*3/uL (ref 150–400)
RBC: 3.48 MIL/uL — AB (ref 3.87–5.11)
RDW: 14.1 % (ref 11.5–15.5)
WBC: 8.6 10*3/uL (ref 4.0–10.5)

## 2016-10-09 NOTE — Pre-Procedure Instructions (Signed)
HARLEM THRESHER  10/09/2016      [facility] [place], [place] [address] Phone: [phone] Fax: [phone]  [id number] [place], [place] - [address] AT [address] [place] [address] Phone: [phone] Fax: [phone]    Your procedure is scheduled on Oct 8.  Report to [facility] Admitting at 845 A.M.  Call this number if you have problems the morning of surgery:  [phone]   Remember:  Do not eat food or drink liquids after midnight.  Take these medicines the morning of surgery with A SIP OF WATER Proventil inhaler if needed,(bring to hospital) amlodipine (Norvasc), anastrozole (Arimidex),  Citalopram (Celexa), Cyclobenzaprine (Flexeril) if needed, esomeprazole (Nexium), Breo ellipta inhaler, Valacyclovir (Valtrex) if needed    Stop taking aspirin, BC's, Goody's, Herbal medications,all vitamins,probiotic, Fish Oil, Aleve, Ibuprofen, Advil, Motrin, Meloxicam (Mobic)   Do not wear jewelry, make-up or nail polish.  Do not wear lotions, powders, or perfumes, or deoderant.  Do not shave 48 hours prior to surgery.  Men may shave face and neck.  Do not bring valuables to the hospital.  [facility] is not responsible for any belongings or valuables.  Contacts, dentures or bridgework may not be worn into surgery.  Leave your suitcase in the car.  After surgery it may be brought to your room.  For patients admitted to the hospital, discharge time will be determined by your treatment team.  Patients discharged the day of surgery will not be allowed to drive home. Special instructions:  [place] - Preparing for Surgery  Before surgery, you can play an important role.  Because skin is not sterile, your skin needs to be as free of germs as possible.  You can reduce the number of germs on you skin by washing with CHG (chlorahexidine gluconate) soap  before surgery.  CHG is an antiseptic cleaner which kills germs and bonds with the skin to continue killing germs even after washing.  Please DO NOT use if you have an allergy to CHG or antibacterial soaps.  If your skin becomes reddened/irritated stop using the CHG and inform your nurse when you arrive at Short Stay.  Do not shave (including legs and underarms) for at least 48 hours prior to the first CHG shower.  You may shave your face.  Please follow these instructions carefully:   1.  Shower with CHG Soap the night before surgery and the  morning of Surgery.  2.  If you choose to wash your hair, wash your hair first as usual with your  normal shampoo.  3.  After you shampoo, rinse your hair and body thoroughly to remove the   Shampoo.  4.  Use CHG as you would any other liquid soap.  You can apply chg directly       to the skin and wash gently with scrungie or a clean washcloth.  5.  Apply the CHG Soap to your body ONLY FROM THE NECK DOWN.    Do not use on open wounds or open sores.  Avoid contact with your eyes,       ears, mouth and genitals (private parts).  Wash genitals (private parts) with your normal soap.  6.  Wash thoroughly, paying special attention to the area where your surgery  will be performed.  7.  Thoroughly rinse your body with  warm water from the neck down.  8.  DO NOT shower/wash with your normal soap after using and rinsing off  the CHG Soap.  9.  Pat yourself dry with a clean towel.            10.  Wear clean pajamas.            11.  Place clean sheets on your bed the night of your first shower and do not sleep with pets.  Day of Surgery  Do not apply any lotions/deoderants the morning of surgery.  Please wear clean clothes to the hospital/surgery center.     Please read over the fact sheets that you were given.

## 2016-10-10 NOTE — Progress Notes (Signed)
Anesthesia Chart Review:  Pt is a 61 year old female scheduled for suspended direct microlaryngoscopy with vocal cord injection with jet ventilation on 10/15/2016 with Melida Quitter, MD  - PCP is Particia Nearing, PA - Cardiologist is Loralie Champagne, MD who followed pt for possible breast cancer treatment cardiotoxicity; last office visit note 08/28/16 indicates pt has completed herceptin therapy and no longer needs routine cardiology monitoring/echoes, can f/u prn.  - Oncologist is Nicholas Lose, MD  PMH includes:  HTN, asthma, anemia, breast cancer, GERD. Former smoker. BMI 29.5. S/p L mastectomy 06/24/15 and reconstruction 12/23/15.   Medications include: Albuterol, amlodipine, arimidex, Nexium, iron  Preoperative labs reviewed.  H/H 9.7/30.7.  This is consistent with recent prior results.   EKG 10/22/15 ([facility]): NSR with sinus arrhythmia. Prolonged QT (QTc 480)  Echo 08/28/16:  - Left ventricle: The cavity size was normal. Systolic function was normal. The estimated ejection fraction was in the range of 60% to 65%. Wall motion was normal; there were no regional wall motion abnormalities. Left ventricular diastolic function parameters were normal. GLS -21.1%. - Aortic valve: There was no stenosis. - Mitral valve: There was trivial regurgitation. - Right ventricle: The cavity size was normal. Systolic function was normal. - Pulmonary arteries: No complete TR doppler jet so unable to estimate PA systolic pressure. - Inferior vena cava: The vessel was normal in size. The respirophasic diameter changes were in the normal range (>= 50%), consistent with normal central venous pressure. - Impressions:  Normal LV size with EF 60-65%. Normal diastolic function. GLS -21.1%. Normal RV size and systolic function.  Nuclear stress test 11/04/15:   Nuclear stress EF: 61%. The LV function is normal  There was no ST segment deviation noted during stress.  Defect 1: There is a small defect of moderate severity  present in the mid anteroseptal, apical anterior and apical septal location. This appears to be due to her breast expander . The defect is fixed and the anterioapical LV contracts well.  This is a low risk study.  The study is normal.  If no changes, I anticipate pt can proceed with surgery as scheduled.   Willeen Cass, FNP-BC [facility] Stay [facility]/Anesthesiology Phone: [phone] 10/10/2016 12:51 PM

## 2016-10-15 ENCOUNTER — Ambulatory Visit (HOSPITAL_COMMUNITY): Payer: BLUE CROSS/BLUE SHIELD | Admitting: Emergency Medicine

## 2016-10-15 ENCOUNTER — Encounter (HOSPITAL_COMMUNITY): Payer: Self-pay | Admitting: *Deleted

## 2016-10-15 ENCOUNTER — Ambulatory Visit (HOSPITAL_COMMUNITY)
Admission: RE | Admit: 2016-10-15 | Discharge: 2016-10-15 | Disposition: A | Discharge status: Home / Self Care | Payer: BLUE CROSS/BLUE SHIELD | Source: Ambulatory Visit | Attending: Otolaryngology | Admitting: Otolaryngology

## 2016-10-15 ENCOUNTER — Encounter (HOSPITAL_COMMUNITY)
Admission: RE | Disposition: A | Discharge status: Home / Self Care | Payer: Self-pay | Source: Ambulatory Visit | Attending: Otolaryngology

## 2016-10-15 ENCOUNTER — Ambulatory Visit (HOSPITAL_COMMUNITY): Payer: BLUE CROSS/BLUE SHIELD | Admitting: Certified Registered Nurse Anesthetist

## 2016-10-15 DIAGNOSIS — D649 Anemia, unspecified: Secondary | ICD-10-CM | POA: Insufficient documentation

## 2016-10-15 DIAGNOSIS — R490 Dysphonia: Secondary | ICD-10-CM | POA: Insufficient documentation

## 2016-10-15 DIAGNOSIS — J4530 Mild persistent asthma, uncomplicated: Secondary | ICD-10-CM | POA: Diagnosis not present

## 2016-10-15 DIAGNOSIS — J45909 Unspecified asthma, uncomplicated: Secondary | ICD-10-CM | POA: Diagnosis not present

## 2016-10-15 DIAGNOSIS — Z853 Personal history of malignant neoplasm of breast: Secondary | ICD-10-CM | POA: Diagnosis not present

## 2016-10-15 DIAGNOSIS — F429 Obsessive-compulsive disorder, unspecified: Secondary | ICD-10-CM | POA: Insufficient documentation

## 2016-10-15 DIAGNOSIS — K219 Gastro-esophageal reflux disease without esophagitis: Secondary | ICD-10-CM | POA: Diagnosis not present

## 2016-10-15 DIAGNOSIS — G4700 Insomnia, unspecified: Secondary | ICD-10-CM | POA: Insufficient documentation

## 2016-10-15 DIAGNOSIS — J383 Other diseases of vocal cords: Secondary | ICD-10-CM | POA: Diagnosis not present

## 2016-10-15 DIAGNOSIS — F419 Anxiety disorder, unspecified: Secondary | ICD-10-CM | POA: Diagnosis not present

## 2016-10-15 DIAGNOSIS — M1990 Unspecified osteoarthritis, unspecified site: Secondary | ICD-10-CM | POA: Insufficient documentation

## 2016-10-15 DIAGNOSIS — I10 Essential (primary) hypertension: Secondary | ICD-10-CM | POA: Insufficient documentation

## 2016-10-15 DIAGNOSIS — Z79899 Other long term (current) drug therapy: Secondary | ICD-10-CM | POA: Insufficient documentation

## 2016-10-15 DIAGNOSIS — Z87891 Personal history of nicotine dependence: Secondary | ICD-10-CM | POA: Insufficient documentation

## 2016-10-15 HISTORY — PX: MICROLARYNGOSCOPY W/VOCAL CORD INJECTION: SHX2665

## 2016-10-15 SURGERY — MICROLARYNGOSCOPY, WITH VOCAL CORD INJECTION
Anesthesia: General | Site: Throat

## 2016-10-15 MED ORDER — HYDROMORPHONE HCL 1 MG/ML IJ SOLN: 0.2500 mg | INTRAMUSCULAR | Status: DC | PRN

## 2016-10-15 MED ORDER — ROCURONIUM BROMIDE 10 MG/ML (PF) SYRINGE
PREFILLED_SYRINGE | INTRAVENOUS | Status: AC
  Filled 2016-10-15: qty 5

## 2016-10-15 MED ORDER — SUGAMMADEX SODIUM 200 MG/2ML IV SOLN
INTRAVENOUS | Status: AC
  Filled 2016-10-15: qty 2

## 2016-10-15 MED ORDER — PROMETHAZINE HCL 25 MG/ML IJ SOLN: 6.2500 mg | INTRAMUSCULAR | Status: DC | PRN

## 2016-10-15 MED ORDER — PROPOFOL 10 MG/ML IV BOLUS
INTRAVENOUS | Status: AC
  Filled 2016-10-15: qty 20

## 2016-10-15 MED ORDER — ONDANSETRON HCL 4 MG/2ML IJ SOLN
INTRAMUSCULAR | Status: DC | PRN
  Administered 2016-10-15: 4 mg via INTRAVENOUS

## 2016-10-15 MED ORDER — LIDOCAINE 2% (20 MG/ML) 5 ML SYRINGE
INTRAMUSCULAR | Status: AC
  Filled 2016-10-15: qty 5

## 2016-10-15 MED ORDER — FENTANYL CITRATE (PF) 250 MCG/5ML IJ SOLN
INTRAMUSCULAR | Status: AC
Start: 2016-10-15 — End: 2016-10-15
  Filled 2016-10-15: qty 5

## 2016-10-15 MED ORDER — SUGAMMADEX SODIUM 200 MG/2ML IV SOLN
INTRAVENOUS | Status: DC | PRN
  Administered 2016-10-15: 200 mg via INTRAVENOUS

## 2016-10-15 MED ORDER — MIDAZOLAM HCL 2 MG/2ML IJ SOLN
INTRAMUSCULAR | Status: DC | PRN
  Administered 2016-10-15: 2 mg via INTRAVENOUS

## 2016-10-15 MED ORDER — LIDOCAINE HCL 4 % MT SOLN
OROMUCOSAL | Status: DC | PRN
  Administered 2016-10-15: 4 mL via TOPICAL

## 2016-10-15 MED ORDER — EPINEPHRINE PF 1 MG/ML IJ SOLN
INTRAMUSCULAR | Status: DC | PRN
  Administered 2016-10-15: 1 mg via ENDOTRACHEOPULMONARY

## 2016-10-15 MED ORDER — LACTATED RINGERS IV SOLN
INTRAVENOUS | Status: DC
  Administered 2016-10-15: 09:00:00 via INTRAVENOUS

## 2016-10-15 MED ORDER — DEXAMETHASONE SODIUM PHOSPHATE 10 MG/ML IJ SOLN
INTRAMUSCULAR | Status: DC | PRN
  Administered 2016-10-15: 10 mg via INTRAVENOUS

## 2016-10-15 MED ORDER — PROPOFOL 10 MG/ML IV BOLUS
INTRAVENOUS | Status: DC | PRN
  Administered 2016-10-15 (×2): 20 mg via INTRAVENOUS
  Administered 2016-10-15: 150 mg via INTRAVENOUS

## 2016-10-15 MED ORDER — EPINEPHRINE HCL (NASAL) 0.1 % NA SOLN
NASAL | Status: AC
  Filled 2016-10-15: qty 30

## 2016-10-15 MED ORDER — PROPOFOL 500 MG/50ML IV EMUL
INTRAVENOUS | Status: DC | PRN
  Administered 2016-10-15: 100 ug/kg/min via INTRAVENOUS

## 2016-10-15 MED ORDER — ONDANSETRON HCL 4 MG/2ML IJ SOLN
INTRAMUSCULAR | Status: AC
  Filled 2016-10-15: qty 2

## 2016-10-15 MED ORDER — FENTANYL CITRATE (PF) 250 MCG/5ML IJ SOLN
INTRAMUSCULAR | Status: DC | PRN
  Administered 2016-10-15 (×2): 50 ug via INTRAVENOUS

## 2016-10-15 MED ORDER — MIDAZOLAM HCL 2 MG/2ML IJ SOLN
INTRAMUSCULAR | Status: AC
  Filled 2016-10-15: qty 2

## 2016-10-15 MED ORDER — ROCURONIUM BROMIDE 10 MG/ML (PF) SYRINGE
PREFILLED_SYRINGE | INTRAVENOUS | Status: DC | PRN
  Administered 2016-10-15: 50 mg via INTRAVENOUS

## 2016-10-15 MED ORDER — LIDOCAINE 2% (20 MG/ML) 5 ML SYRINGE
INTRAMUSCULAR | Status: DC | PRN
  Administered 2016-10-15: 60 mg via INTRAVENOUS

## 2016-10-15 MED ORDER — DEXAMETHASONE SODIUM PHOSPHATE 10 MG/ML IJ SOLN
INTRAMUSCULAR | Status: AC
  Filled 2016-10-15: qty 1

## 2016-10-15 MED ORDER — 0.9 % SODIUM CHLORIDE (POUR BTL) OPTIME
TOPICAL | Status: DC | PRN
  Administered 2016-10-15: 1000 mL

## 2016-10-15 SURGICAL SUPPLY — 28 items
CANISTER SUCT 3000ML PPV (MISCELLANEOUS) ×2 IMPLANT
CONT SPEC 4OZ CLIKSEAL STRL BL (MISCELLANEOUS) IMPLANT
COVER BACK TABLE 60X90IN (DRAPES) ×2 IMPLANT
COVER MAYO STAND STRL (DRAPES) ×2 IMPLANT
CRADLE DONUT ADULT HEAD (MISCELLANEOUS) IMPLANT
DRAPE HALF SHEET 40X57 (DRAPES) ×2 IMPLANT
GAUZE SPONGE 4X4 12PLY STRL (GAUZE/BANDAGES/DRESSINGS) ×2 IMPLANT
GAUZE SPONGE 4X4 16PLY XRAY LF (GAUZE/BANDAGES/DRESSINGS) ×1 IMPLANT
GLOVE BIO SURGEON STRL SZ7.5 (GLOVE) ×2 IMPLANT
GOWN STRL REUS W/ TWL LRG LVL3 (GOWN DISPOSABLE) IMPLANT
GOWN STRL REUS W/TWL LRG LVL3 (GOWN DISPOSABLE)
GUARD TEETH (MISCELLANEOUS) IMPLANT
KIT BASIN OR (CUSTOM PROCEDURE TRAY) ×2 IMPLANT
KIT PROLARN PLUS GEL W/NDL (Prosthesis and Implant ENT) ×2 IMPLANT
KIT ROOM TURNOVER OR (KITS) ×2 IMPLANT
NDL HYPO 25GX1X1/2 BEV (NEEDLE) IMPLANT
NDL TRANS ORAL INJECTION (NEEDLE) IMPLANT
NEEDLE HYPO 25GX1X1/2 BEV (NEEDLE) IMPLANT
NEEDLE TRANS ORAL INJECTION (NEEDLE) IMPLANT
NS IRRIG 1000ML POUR BTL (IV SOLUTION) ×2 IMPLANT
PAD ARMBOARD 7.5X6 YLW CONV (MISCELLANEOUS) ×4 IMPLANT
PATTIES SURGICAL .5 X1 (DISPOSABLE) IMPLANT
PATTIES SURGICAL .5 X3 (DISPOSABLE) IMPLANT
SOLUTION ANTI FOG 6CC (MISCELLANEOUS) ×2 IMPLANT
SURGILUBE 2OZ TUBE FLIPTOP (MISCELLANEOUS) IMPLANT
TOWEL OR 17X24 6PK STRL BLUE (TOWEL DISPOSABLE) ×4 IMPLANT
TUBE CONNECTING 12X1/4 (SUCTIONS) ×2 IMPLANT
WATER STERILE IRR 1000ML POUR (IV SOLUTION) IMPLANT

## 2016-10-15 NOTE — H&P (Signed)
Heidi Walls is an 61 y.o. female.   Chief Complaint: Hoarseness HPI: 61 year old female with five months of hoarseness that had a fairly acute onset and has not improved.  She was found to have mobile vocal folds that were significantly bowed.  She presents for surgical management.  Past Medical History:  Diagnosis Date  . Anemia   . Anxiety    OCD  . Arthritis    knees and hips  . Asthma   . Breast cancer ([place])   . Breast cancer of upper-inner quadrant of left female breast ([place]) 05/26/2015  . GERD (gastroesophageal reflux disease)   . Hypertension   . Insomnia     Past Surgical History:  Procedure Laterality Date  . BREAST RECONSTRUCTION WITH PLACEMENT OF TISSUE EXPANDER AND FLEX HD (ACELLULAR HYDRATED DERMIS) Left 06/24/2015   Procedure: LEFT BREAST RECONSTRUCTION WITH PLACEMENT OF TISSUE EXPANDER AND  ACELLULAR HYDRATED DERMIS (CORTIVA);  Surgeon: Irene Limbo, MD;  Location: [place];  Service: Plastics;  Laterality: Left;  . KNEE SURGERY Right 2000   arthroscopy  . LIPOSUCTION WITH LIPOFILLING Left 12/23/2015   Procedure: LIPOFILLING TO LEFT CHEST FROM ABDOMEN  ;  Surgeon: Irene Limbo, MD;  Location: [facility];  Service: Plastics;  Laterality: Left;  Marland Kitchen MASTECTOMY W/ SENTINEL NODE BIOPSY Left 06/24/2015   Procedure: LEFT TOTAL MASTECTOMY WITH SENTINEL LYMPH NODE BIOPSY;  Surgeon: Excell Seltzer, MD;  Location: [place];  Service: General;  Laterality: Left;  Marland Kitchen MASTOPEXY Right 12/23/2015   Procedure: RIGHT BREAST MASTOPEXY;  Surgeon: Irene Limbo, MD;  Location: [place];  Service: Plastics;  Laterality: Right;  . PORTACATH PLACEMENT Right 06/24/2015   Procedure: INSERTION PORT-A-CATH;  Surgeon: Excell Seltzer, MD;  Location: [facility];  Service: General;  Laterality: Right;  . portacath removal  09/05/2016  . REMOVAL OF BILATERAL TISSUE EXPANDERS WITH PLACEMENT OF BILATERAL  BREAST IMPLANTS Left 12/23/2015   Procedure: REMOVAL OF LEFT  TISSUE EXPANDERS WITH PLACEMENT OF LEFT  BREAST IMPLANTS;  Surgeon: Irene Limbo, MD;  Location: [place];  Service: Plastics;  Laterality: Left;  . TONSILLECTOMY      Family History  Problem Relation Age of Onset  . Heart failure Mother   . Diabetes Mother   . Hypertension Mother   . Hypertension Father   . Breast cancer Maternal Grandmother    Social History:  reports that she quit smoking about 11 years ago. She has never used smokeless tobacco. She reports that she drinks alcohol. She reports that she does not use drugs.  Allergies:  Allergies  Allergen Reactions  . Codeine Shortness Of Breath    Pt reports this was 30 years ago.  Marland Kitchen Lisinopril Cough    Medications Prior to Admission  Medication Sig Dispense Refill  . acetaminophen (TYLENOL) 325 MG tablet Take 650 mg by mouth daily.    Marland Kitchen albuterol (PROVENTIL HFA;VENTOLIN HFA) 108 (90 Base) MCG/ACT inhaler Inhale 2 puffs into the lungs every 6 (six) hours as needed for wheezing or shortness of breath. 1 Inhaler 0  . alendronate (FOSAMAX) 70 MG tablet Take 1 tablet (70 mg total) by mouth once a week. Take with a full glass of water on an empty stomach. (Patient taking differently: Take 70 mg by mouth once a week. Take with a full glass of water on an empty stomach. On Sundays) 12 tablet 3  . ALPRAZolam (XANAX) 0.25 MG tablet Take 1 tablet (  0.25 mg total) by mouth 3 (three) times daily as needed. (Patient taking differently: Take 0.25 mg by mouth at bedtime. ) 90 tablet 2  . amLODipine (NORVASC) 5 MG tablet Take 1 tablet (5 mg total) by mouth daily. 90 tablet 3  . anastrozole (ARIMIDEX) 1 MG tablet Take 1 tablet (1 mg total) by mouth daily. 90 tablet 3  . Ascorbic Acid (VITAMIN C) 1000 MG tablet Take 1,000 mg by mouth daily.    . B Complex-C (SUPER B COMPLEX PO) Take 1 tablet by mouth daily.    . Biotin (BIOTIN 5000) 5 MG CAPS Take 5 mg by mouth  daily.     . Calcium Carb-Cholecalciferol (CALCIUM 600 + D PO) Take 1 tablet by mouth daily.    . citalopram (CELEXA) 20 MG tablet Take 1 tablet (20 mg total) by mouth daily. 90 tablet 0  . cyclobenzaprine (FLEXERIL) 10 MG tablet TAKE 1 TABLET (10 MG TOTAL) BY MOUTH 3 (THREE) TIMES DAILY. (Patient taking differently: Take 10 mg by mouth every evening. ) 60 tablet 2  . esomeprazole (NEXIUM) 20 MG capsule Take 20 mg by mouth daily.     Marland Kitchen FERROUS SULFATE PO Take 1 tablet by mouth daily.    . fluconazole (DIFLUCAN) 150 MG tablet TAKE 1 TABLET BY MOUTH EVERY DAY (Patient taking differently: TAKE 1 TABLET BY MOUTH DAILY AS NEEDED FOR THRUSH) 4 tablet 2  . fluticasone furoate-vilanterol (BREO ELLIPTA) 100-25 MCG/INH AEPB Inhale 1 puff into the lungs daily. 1 each 11  . FOLIC ACID PO Take 1 tablet by mouth daily.    . meloxicam (MOBIC) 7.5 MG tablet TAKE 1 TABLET BY MOUTH EVERY DAY 30 tablet 0  . Multiple Vitamin (MULTIVITAMIN) tablet Take 1 tablet by mouth daily.    Marland Kitchen olopatadine (PATANOL) 0.1 % ophthalmic solution Place 1 drop into both eyes 2 (two) times daily. (Patient taking differently: Place 1 drop into both eyes daily as needed for allergies. ) 5 mL 12  . Omega-3 Fatty Acids (FISH OIL) 1200 MG CAPS Take 1,200 mg by mouth daily.    . Probiotic Product (PROBIOTIC PO) Take 1 tablet by mouth daily.    . betamethasone dipropionate (DIPROLENE) 0.05 % cream Apply topically 2 (two) times daily. (Patient not taking: Reported on 10/05/2016) 45 g 5  . bifidobacterium infantis (ALIGN) capsule Take 1 capsule by mouth daily. (Patient not taking: Reported on 10/05/2016) 60 capsule   . desoximetasone (TOPICORT) 0.05 % cream Apply topically 2 (two) times daily. (Patient not taking: Reported on 10/05/2016) 30 g 6  . hydrochlorothiazide (HYDRODIURIL) 25 MG tablet Take 1 tablet (25 mg total) by mouth daily. (Patient not taking: Reported on 10/05/2016) 90 tablet 3  . lidocaine-prilocaine (EMLA) cream Apply to affected area  once (Patient not taking: Reported on 10/05/2016) 30 g 3  . predniSONE (STERAPRED UNI-PAK 48 TAB) 10 MG (48) TBPK tablet Take 12 day pack as directed (Patient not taking: Reported on 10/05/2016) 48 tablet 0  . valACYclovir (VALTREX) 1000 MG tablet Take 1 tablet (1,000 mg total) by mouth 2 (two) times daily. (Patient taking differently: Take 1,000 mg by mouth 2 (two) times daily as needed (out breaks). ) 20 tablet 6    No results found for this or any previous visit (from the past 48 hour(s)). No results found.  Review of Systems  All other systems reviewed and are negative.   Blood pressure (!) 160/81, pulse 100, temperature 98.1 F (36.7 C), temperature source Oral, resp. rate  20, weight 167 lb (75.8 kg), SpO2 99 %. Physical Exam  Constitutional: She is oriented to person, place, and time. She appears well-developed and well-nourished. No distress.  HENT:  Head: Normocephalic and atraumatic.  Right Ear: External ear normal.  Left Ear: External ear normal.  Nose: Nose normal.  Mouth/Throat: Oropharynx is clear and moist.  Moderately hoarse, deeper tone, somewhat breathy.  Eyes: Pupils are equal, round, and reactive to light. Conjunctivae and EOM are normal.  Neck: Normal range of motion. Neck supple.  Cardiovascular: Normal rate.   Respiratory: Effort normal.  Musculoskeletal: Normal range of motion.  Neurological: She is alert and oriented to person, place, and time. No cranial nerve deficit.  Skin: Skin is warm and dry.  Psychiatric: She has a normal mood and affect. Her behavior is normal. Judgment and thought content normal.     Assessment/Plan Hoarseness, vocal fold atrophy To OR for SMDL with Prolaryn injections.  Jet ventilation.  Discharge from PACU.  Melida Quitter, MD 10/15/2016, 10:13 AM

## 2016-10-15 NOTE — Anesthesia Postprocedure Evaluation (Signed)
Anesthesia Post Note  Patient: Heidi Walls  Procedure(s) Performed: SUSPENDED DIRECT MICROLARYNGOSCOPY WITH VOCAL CORD INJECTION WITH JET VENTILATION (N/A Throat)     Patient location during evaluation: PACU Anesthesia Type: General Level of consciousness: sedated Pain management: pain level controlled Vital Signs Assessment: post-procedure vital signs reviewed and stable Respiratory status: spontaneous breathing and respiratory function stable Cardiovascular status: stable Postop Assessment: no apparent nausea or vomiting Anesthetic complications: no    Last Vitals:  Vitals:   10/15/16 1115 10/15/16 1124  BP:  (!) 147/76  Pulse: 91 91  Resp: 20 19  Temp:    SpO2: 98% 99%    Last Pain:  Vitals:   10/15/16 0847  TempSrc: Oral                 Nabila Albarracin DANIEL

## 2016-10-15 NOTE — Anesthesia Preprocedure Evaluation (Addendum)
Anesthesia Evaluation  Patient identified by MRN, date of birth, ID band Patient awake    Reviewed: Allergy & Precautions, H&P , NPO status , Patient's Chart, lab work & pertinent test results  History of Anesthesia Complications Negative for: history of anesthetic complications  Airway Mallampati: II   Neck ROM: full    Dental  (+) Dental Advisory Given   Pulmonary asthma , former smoker,    breath sounds clear to auscultation       Cardiovascular hypertension, Pt. on medications  Rhythm:regular Rate:Normal     Neuro/Psych PSYCHIATRIC DISORDERS Anxiety Depression negative neurological ROS     GI/Hepatic Neg liver ROS, GERD  ,  Endo/Other  negative endocrine ROS  Renal/GU negative Renal ROS     Musculoskeletal  (+) Arthritis ,   Abdominal   Peds  Hematology   Anesthesia Other Findings   Reproductive/Obstetrics Breast CA                            Anesthesia Physical  Anesthesia Plan  ASA: II  Anesthesia Plan: General   Post-op Pain Management:    Induction: Intravenous  PONV Risk Score and Plan: 2 and Ondansetron, Dexamethasone and Treatment may vary due to age or medical condition  Airway Management Planned: Oral ETT  Additional Equipment:   Intra-op Plan:   Post-operative Plan: Extubation in OR  Informed Consent: I have reviewed the patients History and Physical, chart, labs and discussed the procedure including the risks, benefits and alternatives for the proposed anesthesia with the patient or authorized representative who has indicated his/her understanding and acceptance.   Dental advisory given  Plan Discussed with: CRNA, Anesthesiologist and Surgeon  Anesthesia Plan Comments:         Anesthesia Quick Evaluation

## 2016-10-15 NOTE — Transfer of Care (Cosign Needed)
Immediate Anesthesia Transfer of Care Note  Patient: Heidi Walls  Procedure(s) Performed: SUSPENDED DIRECT MICROLARYNGOSCOPY WITH VOCAL CORD INJECTION WITH JET VENTILATION (N/A Throat)  Patient Location: PACU  Anesthesia Type:General  Level of Consciousness: awake, alert , oriented and patient cooperative  Airway & Oxygen Therapy: Patient Spontanous Breathing and Patient connected to face mask oxygen  Post-op Assessment: Report given to RN and Post -op Vital signs reviewed and stable  Post vital signs: Reviewed and stable  Last Vitals:  Vitals:   10/15/16 0847 10/15/16 1054  BP: (!) 160/81   Pulse: 100   Resp: 20   Temp: 36.7 C (P) 36.4 C  SpO2: 99%     Last Pain:  Vitals:   10/15/16 0847  TempSrc: Oral      Patients Stated Pain Goal: 8 (03/54/65 6812)  Complications: No apparent anesthesia complications

## 2016-10-15 NOTE — Brief Op Note (Signed)
10/15/2016  10:47 AM  PATIENT:  Heidi Walls  61 y.o. female  PRE-OPERATIVE DIAGNOSIS:  vocal cord atrophy  POST-OPERATIVE DIAGNOSIS:  vocal cord atrophy  PROCEDURE:  Procedure(s): SUSPENDED DIRECT MICROLARYNGOSCOPY WITH VOCAL CORD INJECTION WITH JET VENTILATION (N/A)  SURGEON:  Surgeon(s) and Role:    Melida Quitter, MD - Primary  PHYSICIAN ASSISTANT:   ASSISTANTS: none   ANESTHESIA:   general  EBL:  No intake/output data recorded.  BLOOD ADMINISTERED:none  DRAINS: none   LOCAL MEDICATIONS USED:  NONE  SPECIMEN:  No Specimen  DISPOSITION OF SPECIMEN:  N/A  COUNTS:  YES  TOURNIQUET:  * No tourniquets in log *  DICTATION: .Other Dictation: Dictation Number [id number]  PLAN OF CARE: Discharge to home after PACU  PATIENT DISPOSITION:  PACU - hemodynamically stable.   Delay start of Pharmacological VTE agent (>24hrs) due to surgical blood loss or risk of bleeding: no

## 2016-10-16 ENCOUNTER — Encounter (HOSPITAL_COMMUNITY): Payer: Self-pay | Admitting: Otolaryngology

## 2016-10-16 NOTE — Op Note (Signed)
NAME:  Heidi Walls, Heidi Walls                     ACCOUNT NO.:  MEDICAL RECORD NO.:  [id number]  LOCATION:                                 FACILITY:  PHYSICIAN:  Onnie Graham, MD     DATE OF BIRTH:  [birth year]  DATE OF PROCEDURE:  10/15/2016 DATE OF DISCHARGE:                              OPERATIVE REPORT   PREOPERATIVE DIAGNOSES: 1. Vocal fold atrophy. 2. Hoarseness.  POSTOPERATIVE DIAGNOSES: 1. Vocal fold atrophy. 2. Hoarseness.  PROCEDURE:  Suspended microdirect laryngoscopy with bilateral Prolaryn injections.  SURGEON:  Onnie Graham, MD.  ANESTHESIA:  General jet Venturi ventilation.  COMPLICATIONS:  None.  INDICATION:  The patient is a 61 year old female who recently underwent therapy for breast cancer and rather acutely developed hoarseness about 5 months ago.  She was found to have atrophied vocal folds resulting in a glottic gap during phonation, but both vocal folds moving.  In discussing options, she elected to proceed with vocal fold injection augmentation.  FINDINGS:  There were no lesions on the vocal folds.  Both had a little bit of atrophy discernible in surgery.  The mucosa on both vocal folds was slightly thickened.  0.3 mL of Prolaryn was injected in each vocal fold.  DESCRIPTION OF PROCEDURE:  The patient was identified in the holding room.  Informed consent having been obtained including discussion of risks, benefits, and alternatives, the patient was brought to the operative suite and put on the operating table in supine position. Anesthesia was induced and the patient was maintained via mask ventilation.  The patient was given intravenous steroids during the case.  The bed was turned 90 degrees from anesthesia and the eyes were taped closed.  A damp gauze was placed over the upper gum, and a Storz laryngoscope was then placed in the supraglottic position and suspended to the Mayo stand using a Lewy arm.  Jet Venturi ventilation was then initiated.  A  preoperative photograph was made with a 0-degree telescope.  The operating microscope was then brought into the field and Prolaryn was injected first on the left side and then on the right side, totaling 0.3 mL in each side.  About 0.2 mL was placed in the anterior fold and 0.1 posteriorly.  Injections were all placed laterally in each vocal fold.  After this was completed, the airway was suctioned and a 0- degree telescope was used to make a postoperative photograph.  The larynx was sprayed with topical lidocaine.  The laryngoscope was then taken out of suspension and removed from the patient's mouth while suctioning the airway.  The gauze was removed and the patient was maintained via mask ventilation.  She was turned back to Anesthesia for wake-up and was moved to the recovery room in stable condition.     Onnie Graham, MD     DDB/MEDQ  D:  10/15/2016  T:  10/16/2016  Job:  [id number]  cc:   Onnie Graham, MD's office

## 2016-10-24 DIAGNOSIS — M7989 Other specified soft tissue disorders: Secondary | ICD-10-CM | POA: Diagnosis not present

## 2016-10-24 DIAGNOSIS — S99921A Unspecified injury of right foot, initial encounter: Secondary | ICD-10-CM | POA: Diagnosis not present

## 2016-10-24 DIAGNOSIS — Z885 Allergy status to narcotic agent status: Secondary | ICD-10-CM | POA: Diagnosis not present

## 2016-10-24 DIAGNOSIS — S9031XA Contusion of right foot, initial encounter: Secondary | ICD-10-CM | POA: Diagnosis not present

## 2016-10-24 DIAGNOSIS — Z791 Long term (current) use of non-steroidal anti-inflammatories (NSAID): Secondary | ICD-10-CM | POA: Diagnosis not present

## 2016-10-24 DIAGNOSIS — I10 Essential (primary) hypertension: Secondary | ICD-10-CM | POA: Diagnosis not present

## 2016-10-24 DIAGNOSIS — M19071 Primary osteoarthritis, right ankle and foot: Secondary | ICD-10-CM | POA: Diagnosis not present

## 2016-10-24 DIAGNOSIS — W208XXA Other cause of strike by thrown, projected or falling object, initial encounter: Secondary | ICD-10-CM | POA: Diagnosis not present

## 2016-10-24 DIAGNOSIS — Z87891 Personal history of nicotine dependence: Secondary | ICD-10-CM | POA: Diagnosis not present

## 2016-10-24 DIAGNOSIS — M79671 Pain in right foot: Secondary | ICD-10-CM | POA: Diagnosis not present

## 2016-10-24 DIAGNOSIS — Z853 Personal history of malignant neoplasm of breast: Secondary | ICD-10-CM | POA: Diagnosis not present

## 2016-10-24 DIAGNOSIS — Z79899 Other long term (current) drug therapy: Secondary | ICD-10-CM | POA: Diagnosis not present

## 2016-10-26 ENCOUNTER — Other Ambulatory Visit: Payer: Self-pay | Admitting: Physician Assistant

## 2016-10-30 DIAGNOSIS — J383 Other diseases of vocal cords: Secondary | ICD-10-CM | POA: Diagnosis not present

## 2016-10-30 DIAGNOSIS — R490 Dysphonia: Secondary | ICD-10-CM | POA: Diagnosis not present

## 2016-11-01 ENCOUNTER — Other Ambulatory Visit: Payer: Self-pay | Admitting: Physician Assistant

## 2016-11-01 DIAGNOSIS — F411 Generalized anxiety disorder: Secondary | ICD-10-CM

## 2016-11-02 NOTE — Telephone Encounter (Signed)
Defer to [place], MD [facility] 11/02/2016, 11:59 AM

## 2016-11-02 NOTE — Telephone Encounter (Signed)
Last seen 09/26/16  Glenard Haring PCP  If approved route to nurse to call into [facility]  [phone]

## 2016-11-05 NOTE — Telephone Encounter (Signed)
rx phoned in

## 2016-11-09 ENCOUNTER — Encounter: Payer: Self-pay | Admitting: Adult Health

## 2016-11-09 ENCOUNTER — Ambulatory Visit (HOSPITAL_BASED_OUTPATIENT_CLINIC_OR_DEPARTMENT_OTHER): Payer: BLUE CROSS/BLUE SHIELD | Admitting: Adult Health

## 2016-11-09 VITALS — BP 166/79 | HR 108 | Temp 98.1°F | Resp 18 | Ht 63.0 in | Wt 170.1 lb

## 2016-11-09 DIAGNOSIS — Z79811 Long term (current) use of aromatase inhibitors: Secondary | ICD-10-CM

## 2016-11-09 DIAGNOSIS — C50212 Malignant neoplasm of upper-inner quadrant of left female breast: Secondary | ICD-10-CM

## 2016-11-09 DIAGNOSIS — Z170 Estrogen receptor positive status [ER+]: Secondary | ICD-10-CM | POA: Diagnosis not present

## 2016-11-09 NOTE — Progress Notes (Signed)
CLINIC:  Survivorship   REASON FOR VISIT:  Routine follow-up post-treatment for a recent history of breast cancer.  BRIEF ONCOLOGIC HISTORY:    Breast cancer of upper-inner quadrant of left female breast ([place])   05/26/2015 Initial Diagnosis    Screen detected left breast asymmetry (posterior 1.6 cm): Grade 2-3 IDC ER/PR positive HER-2 positive Ki-67 20% plus calcs (span 6.1 cm): High-grade DCIS with suspicious foci of invasion (4.2 cm apart)      06/24/2015 Surgery    Left simple mastectomy with reconstruction: IDC grade 3, 2.5 cm, with high-grade DCIS, ALH, LVI present, margins negative, 0/1 sentinel node negative, T2 N0 stage II a, ER 100%, PR 5%, HER-2 positive ratio 1.56 with average copy #8.25, Ki-67 20%      07/25/2015 - 11/03/2015 Chemotherapy    Adjuvant chemotherapy with TCH Perjeta 6 cycles followed by Herceptin maintenance for 1 year      11/28/2015 -  Anti-estrogen oral therapy    Adjuvant anastrozole 1 mg daily       INTERVAL HISTORY:  Heidi Walls presents to the [facility] today for our initial meeting to review her survivorship care plan detailing her treatment course for breast cancer, as well as monitoring long-term side effects of that treatment, education regarding health maintenance, screening, and overall wellness and health promotion.     Overall, Ms. Elmes reports feeling quite well.  She is taking Anastrozole daily and is tolerating it well.      REVIEW OF SYSTEMS:  Review of Systems  Constitutional: Negative for appetite change, chills, fatigue and fever.  HENT:   Negative for hearing loss and lump/mass.   Eyes: Negative for eye problems and icterus.  Respiratory: Negative for chest tightness, cough and shortness of breath.   Cardiovascular: Negative for chest pain, leg swelling and palpitations.  Gastrointestinal: Positive for vomiting. Negative for abdominal distention, abdominal pain, constipation, diarrhea and nausea.  Endocrine:  Negative for hot flashes.  Musculoskeletal: Negative for arthralgias.  Skin: Negative for itching.  Neurological: Negative for dizziness, extremity weakness, headaches and numbness.  Hematological: Negative for adenopathy. Does not bruise/bleed easily.  Psychiatric/Behavioral: Negative for depression. The patient is not nervous/anxious.   Breast: Denies any new nodularity, masses, tenderness, nipple changes, or nipple discharge.      ONCOLOGY TREATMENT TEAM:  1. Surgeon:  Dr. Excell Seltzer at [facility] 2. Medical Oncologist: Dr. Lindi Adie      PAST MEDICAL/SURGICAL HISTORY:  Past Medical History:  Diagnosis Date  . Anemia   . Anxiety    OCD  . Arthritis    knees and hips  . Asthma   . Breast cancer ([place])   . Breast cancer of upper-inner quadrant of left female breast ([place]) 05/26/2015  . GERD (gastroesophageal reflux disease)   . Hypertension   . Insomnia    Past Surgical History:  Procedure Laterality Date  . BREAST RECONSTRUCTION WITH PLACEMENT OF TISSUE EXPANDER AND FLEX HD (ACELLULAR HYDRATED DERMIS) Left 06/24/2015   Procedure: LEFT BREAST RECONSTRUCTION WITH PLACEMENT OF TISSUE EXPANDER AND  ACELLULAR HYDRATED DERMIS (CORTIVA);  Surgeon: Irene Limbo, MD;  Location: [place];  Service: Plastics;  Laterality: Left;  . KNEE SURGERY Right 2000   arthroscopy  . LIPOSUCTION WITH LIPOFILLING Left 12/23/2015   Procedure: LIPOFILLING TO LEFT CHEST FROM ABDOMEN  ;  Surgeon: Irene Limbo, MD;  Location: [place];  Service: Plastics;  Laterality: Left;  Marland Kitchen MASTECTOMY W/ SENTINEL NODE BIOPSY Left 06/24/2015   Procedure:  LEFT TOTAL MASTECTOMY WITH SENTINEL LYMPH NODE BIOPSY;  Surgeon: Excell Seltzer, MD;  Location: [place];  Service: General;  Laterality: Left;  Marland Kitchen MASTOPEXY Right 12/23/2015   Procedure: RIGHT BREAST MASTOPEXY;  Surgeon: Irene Limbo, MD;  Location: [place];  Service: Plastics;   Laterality: Right;  . MICROLARYNGOSCOPY W/VOCAL CORD INJECTION N/A 10/15/2016   Procedure: SUSPENDED DIRECT MICROLARYNGOSCOPY WITH VOCAL CORD INJECTION WITH JET VENTILATION;  Surgeon: Melida Quitter, MD;  Location: [place];  Service: ENT;  Laterality: N/A;  . PORTACATH PLACEMENT Right 06/24/2015   Procedure: INSERTION PORT-A-CATH;  Surgeon: Excell Seltzer, MD;  Location: [place];  Service: General;  Laterality: Right;  . portacath removal  09/05/2016  . REMOVAL OF BILATERAL TISSUE EXPANDERS WITH PLACEMENT OF BILATERAL BREAST IMPLANTS Left 12/23/2015   Procedure: REMOVAL OF LEFT  TISSUE EXPANDERS WITH PLACEMENT OF LEFT  BREAST IMPLANTS;  Surgeon: Irene Limbo, MD;  Location: [place];  Service: Plastics;  Laterality: Left;  . TONSILLECTOMY       ALLERGIES:  Allergies  Allergen Reactions  . Codeine Shortness Of Breath    Pt reports this was 30 years ago.  Marland Kitchen Lisinopril Cough     CURRENT MEDICATIONS:  Outpatient Encounter Prescriptions as of 11/09/2016  Medication Sig Note  . acetaminophen (TYLENOL) 325 MG tablet Take 650 mg by mouth daily.   Marland Kitchen albuterol (PROVENTIL HFA;VENTOLIN HFA) 108 (90 Base) MCG/ACT inhaler Inhale 2 puffs into the lungs every 6 (six) hours as needed for wheezing or shortness of breath.   Marland Kitchen alendronate (FOSAMAX) 70 MG tablet Take 1 tablet (70 mg total) by mouth once a week. Take with a full glass of water on an empty stomach. (Patient taking differently: Take 70 mg by mouth once a week. Take with a full glass of water on an empty stomach. On Sundays)   . ALPRAZolam (XANAX) 0.25 MG tablet TAKE 1 TABLET BY MOUTH THREE TIMES A DAY AS NEEDED   . amLODipine (NORVASC) 5 MG tablet Take 1 tablet (5 mg total) by mouth daily.   Marland Kitchen anastrozole (ARIMIDEX) 1 MG tablet Take 1 tablet (1 mg total) by mouth daily.   . Ascorbic Acid (VITAMIN C) 1000 MG tablet Take 1,000 mg by mouth daily.   . B Complex-C (SUPER B COMPLEX PO) Take 1 tablet by mouth  daily.   . Biotin (BIOTIN 5000) 5 MG CAPS Take 5 mg by mouth daily.    . Calcium Carb-Cholecalciferol (CALCIUM 600 + D PO) Take 1 tablet by mouth daily.   . citalopram (CELEXA) 20 MG tablet Take 1 tablet (20 mg total) by mouth daily.   . cyclobenzaprine (FLEXERIL) 10 MG tablet TAKE 1 TABLET (10 MG TOTAL) BY MOUTH 3 (THREE) TIMES DAILY. (Patient taking differently: Take 10 mg by mouth every evening. )   . esomeprazole (NEXIUM) 20 MG capsule Take 20 mg by mouth daily.    Marland Kitchen FERROUS SULFATE PO Take 1 tablet by mouth daily.   . fluticasone furoate-vilanterol (BREO ELLIPTA) 100-25 MCG/INH AEPB Inhale 1 puff into the lungs daily.   Marland Kitchen FOLIC ACID PO Take 1 tablet by mouth daily.   . meloxicam (MOBIC) 7.5 MG tablet TAKE 1 TABLET BY MOUTH EVERY DAY   . Multiple Vitamin (MULTIVITAMIN) tablet Take 1 tablet by mouth daily.   Marland Kitchen olopatadine (PATANOL) 0.1 % ophthalmic solution Place 1 drop into both eyes 2 (two) times daily. (Patient taking differently: Place 1 drop into both eyes daily as  needed for allergies. )   . Omega-3 Fatty Acids (FISH OIL) 1200 MG CAPS Take 1,200 mg by mouth daily. 10/05/2016: Will stop 5 days prior to procedure  . Probiotic Product (PROBIOTIC PO) Take 1 tablet by mouth daily.   . fluconazole (DIFLUCAN) 150 MG tablet TAKE 1 TABLET BY MOUTH EVERY DAY (Patient not taking: Reported on 11/09/2016) 10/05/2016: Only takes when takes steroids.. Had to have 3 weeks ago.   . valACYclovir (VALTREX) 1000 MG tablet Take 1 tablet (1,000 mg total) by mouth 2 (two) times daily. (Patient not taking: Reported on 11/09/2016)    No facility-administered encounter medications on file as of 11/09/2016.      ONCOLOGIC FAMILY HISTORY:  Family History  Problem Relation Age of Onset  . Heart failure Mother   . Diabetes Mother   . Hypertension Mother   . Hypertension Father   . Breast cancer Maternal Grandmother      GENETIC COUNSELING/TESTING: Not at this time   PHYSICAL EXAMINATION:  Vital Signs:     Vitals:   11/09/16 1004  BP: (!) 166/79  Pulse: (!) 108  Resp: 18  Temp: 98.1 F (36.7 C)  SpO2: 100%   Filed Weights   11/09/16 1004  Weight: 170 lb 1.6 oz (77.2 kg)   General: Well-nourished, well-appearing female in no acute distress.  She is unaccompanied today.   HEENT: Head is normocephalic.  Pupils equal and reactive to light. Conjunctivae clear without exudate.  Sclerae anicteric. Oral mucosa is pink, moist.  Oropharynx is pink without lesions or erythema.  Lymph: No cervical, supraclavicular, or infraclavicular lymphadenopathy noted on palpation.  Cardiovascular: Regular rate and rhythm.Marland Kitchen Respiratory: Clear to auscultation bilaterally. Chest expansion symmetric; breathing non-labored.  GI: Abdomen soft and round; non-tender, non-distended. Bowel sounds normoactive.  GU: Deferred.  Neuro: No focal deficits. Steady gait.  Psych: Mood and affect normal and appropriate for situation.  Extremities: No edema. MSK: No focal spinal tenderness to palpation.  Full range of motion in bilateral upper extremities Skin: Warm and dry.  LABORATORY DATA:  Heidi Walls for this visit.  DIAGNOSTIC IMAGING:  Heidi Walls for this visit.      ASSESSMENT AND PLAN:  Ms.. Burr is a pleasant 61 y.o. female with Stage IIA left breast invasive ductal carcinoma, ER+/PR+/HER2+, diagnosed in 05/2015, treated with left mastectomy, adjuvant chemotherapy, maintenance Herceptin, and anti-estrogen therapy with Anastrozole beginning in 11/2015.  She presents to the Survivorship Clinic for our initial meeting and routine follow-up post-completion of treatment for breast cancer.    1. Stage IIA left breast cancer:  Heidi Walls is continuing to recover from definitive treatment for breast cancer. She will follow-up with her medical oncologist, Dr. Lindi Adie in three months with history and physical exam per surveillance protocol.  She will continue her anti-estrogen therapy with Anastrozole. Thus far, she is tolerating the  Anastrozole well, with minimal side effects. She was instructed to make Dr. Lindi Adie or myself aware if she begins to experience any worsening side effects of the medication and I could see her back in clinic to help manage those side effects, as needed. Today, a comprehensive survivorship care plan and treatment summary was reviewed with the patient today detailing her breast cancer diagnosis, treatment course, potential late/long-term effects of treatment, appropriate follow-up care with recommendations for the future, and patient education resources.  A copy of this summary, along with a letter will be sent to the patient's primary care provider via mail/fax/In Basket message after today's visit.  2. Bone health:  Given Heidi Walls's age/history of breast cancer and her current treatment regimen including anti-estrogen therapy with Anastrozole, she is at risk for bone demineralization.  She does have osteoporosis and takes Fosamax.  In the meantime, she was encouraged to increase her consumption of foods rich in calcium, as well as increase her weight-bearing activities.  She was given education on specific activities to promote bone health.  3. Cancer screening:  Due to Heidi Walls's history and her age, she should receive screening for skin cancers, colon cancer, and gynecologic cancers.  The information and recommendations are listed on the patient's comprehensive care plan/treatment summary and were reviewed in detail with the patient.    4. Health maintenance and wellness promotion: Heidi Walls was encouraged to consume 5-7 servings of fruits and vegetables per day. We reviewed the "Nutrition Rainbow" handout, as well as the handout "Take Control of Your Health and Reduce Your Cancer Risk" from the [employer].  She was also encouraged to engage in moderate to vigorous exercise for 30 minutes per day most days of the week. We discussed the [employer] fitness program, which is designed for  cancer survivors to help them become more physically fit after cancer treatments.  She was instructed to limit her alcohol consumption and continue to abstain from tobacco use.     5. Support services/counseling: It is not uncommon for this period of the patient's cancer care trajectory to be one of many emotions and stressors.  We discussed an opportunity for her to participate in the next session of [facility] ("Finding Your New Normal") support group series designed for patients after they have completed treatment.   Heidi Walls was encouraged to take advantage of our many other support services programs, support groups, and/or counseling in coping with her new life as a cancer survivor after completing anti-cancer treatment.  She was offered support today through active listening and expressive supportive counseling.  She was given information regarding our available services and encouraged to contact me with any questions or for help enrolling in any of our support group/programs.    Dispo:   -Return to cancer center in 3 months for follow up with Dr. Lindi Adie  -She is undergoing right breast screening mammograms -Follow up with surgery in 9 months with Dr. Excell Seltzer -She is welcome to return back to the Survivorship Clinic at any time; no additional follow-up needed at this time.  -Consider referral back to survivorship as a long-term survivor for continued surveillance  A total of (30) minutes of face-to-face time was spent with this patient with greater than 50% of that time in counseling and care-coordination.   Gardenia Phlegm, NP Survivorship Program [facility] [phone]   Note: PRIMARY CARE PROVIDER Terald Sleeper, [place] [phone]

## 2016-11-10 ENCOUNTER — Other Ambulatory Visit: Payer: Self-pay | Admitting: Physician Assistant

## 2016-11-16 ENCOUNTER — Telehealth: Payer: Self-pay | Admitting: Adult Health

## 2016-11-16 ENCOUNTER — Telehealth: Payer: Self-pay | Admitting: Physician Assistant

## 2016-11-16 NOTE — Telephone Encounter (Signed)
appt made for MON

## 2016-11-16 NOTE — Telephone Encounter (Signed)
All joint hurt - can move w/o pain. She thinks that the Arimidex is causing this -- she know she has to take this for 5 yeas..   She says she takes about 9 tylenol a day   She also is emotional and after effects are upsetting her = emotions are crazy/ mood swings  Should antidepressant be changed.

## 2016-11-16 NOTE — Telephone Encounter (Signed)
Spoke with patient regarding appt added per 11/2 los.  °

## 2016-11-19 ENCOUNTER — Encounter: Payer: Self-pay | Admitting: Physician Assistant

## 2016-11-19 ENCOUNTER — Ambulatory Visit: Payer: BLUE CROSS/BLUE SHIELD | Admitting: Physician Assistant

## 2016-11-19 DIAGNOSIS — G893 Neoplasm related pain (acute) (chronic): Secondary | ICD-10-CM | POA: Diagnosis not present

## 2016-11-19 DIAGNOSIS — G8929 Other chronic pain: Secondary | ICD-10-CM | POA: Insufficient documentation

## 2016-11-19 DIAGNOSIS — G629 Polyneuropathy, unspecified: Secondary | ICD-10-CM | POA: Diagnosis not present

## 2016-11-19 MED ORDER — TRAMADOL HCL 50 MG PO TABS: 50.0000 mg | ORAL_TABLET | Freq: Four times a day (QID) | ORAL | 2 refills | Status: DC | PRN

## 2016-11-19 MED ORDER — GABAPENTIN 100 MG PO CAPS: 100.0000 mg | ORAL_CAPSULE | Freq: Every day | ORAL | 3 refills | Status: DC

## 2016-11-19 MED ORDER — AMLODIPINE BESYLATE 10 MG PO TABS: 10.0000 mg | ORAL_TABLET | Freq: Every day | ORAL | 3 refills | Status: DC

## 2016-11-19 NOTE — Progress Notes (Signed)
BP (!) 160/79   Pulse (!) 101   Temp (!) 97 F (36.1 C) (Oral)   Ht 5\' 3"  (1.6 m)   Wt 171 lb (77.6 kg)   BMI 30.29 kg/m    Subjective:    Patient ID: Heidi Walls, female    DOB: 1955/06/28, 61 y.o.   MRN: [id number]  HPI: Heidi Walls is a 61 y.o. female presenting on 11/19/2016 for Pain; Hypertension; and Skin hurts  This patient comes in with several complaints of excessive fatigue and neuropathic type symptoms.  She states she hurts all over.  Even her skin will hurt at times.  She does have decreased ability to concentrate.  She started the Arimidex about 1 year ago.  The symptoms have only come in the past few months as she finished up her breast cancer treatment.  She has to continue with this.  Some of the major side effects noted long-term are neuropathy, fatigue, menopausal symptoms, cognitive dysfunction, heart failure, kidney failure, liver failure, infertility.  All of her conditions are primarily being covered by oncology and cardiology.  However she does not have anything to help with the symptoms.  They have had a long discussion about gabapentin and the way it is used in neuropathic conditions.  She is willing to try it.  Relevant past medical, surgical, family and social history reviewed and updated as indicated. Allergies and medications reviewed and updated.  Past Medical History:  Diagnosis Date  . Anemia   . Anxiety    OCD  . Arthritis    knees and hips  . Asthma   . Breast cancer ([place])   . Breast cancer of upper-inner quadrant of left female breast ([place]) 05/26/2015  . GERD (gastroesophageal reflux disease)   . Hypertension   . Insomnia     Past Surgical History:  Procedure Laterality Date  . KNEE SURGERY Right 2000   arthroscopy  . portacath removal  09/05/2016  . TONSILLECTOMY      Review of Systems  Constitutional: Positive for fatigue. Negative for activity change, fever and unexpected weight change.  HENT: Negative.   Eyes: Negative.     Respiratory: Negative.  Negative for cough.   Cardiovascular: Negative.  Negative for chest pain.  Gastrointestinal: Negative.  Negative for abdominal pain.  Endocrine: Negative.   Genitourinary: Negative.  Negative for dysuria.  Musculoskeletal: Negative.   Skin: Negative.   Neurological: Positive for weakness. Negative for dizziness, tremors, seizures, numbness and headaches.    Allergies as of 11/19/2016      Reactions   Codeine Shortness Of Breath   Pt reports this was 30 years ago.   Lisinopril Cough      Medication List        Accurate as of 11/19/16  2:50 PM. Always use your most recent med list.          acetaminophen 325 MG tablet Commonly known as:  TYLENOL Take 650 mg by mouth daily.   albuterol 108 (90 Base) MCG/ACT inhaler Commonly known as:  PROVENTIL HFA;VENTOLIN HFA Inhale 2 puffs into the lungs every 6 (six) hours as needed for wheezing or shortness of breath.   alendronate 70 MG tablet Commonly known as:  FOSAMAX Take 1 tablet (70 mg total) by mouth once a week. Take with a full glass of water on an empty stomach.   ALPRAZolam 0.25 MG tablet Commonly known as:  XANAX TAKE 1 TABLET BY MOUTH THREE TIMES A DAY AS NEEDED  amLODipine 10 MG tablet Commonly known as:  NORVASC Take 1 tablet (10 mg total) daily by mouth.   anastrozole 1 MG tablet Commonly known as:  ARIMIDEX Take 1 tablet (1 mg total) by mouth daily.   BIOTIN 5000 5 MG Caps Generic drug:  Biotin Take 5 mg by mouth daily.   CALCIUM 600 + D PO Take 1 tablet by mouth daily.   citalopram 20 MG tablet Commonly known as:  CELEXA TAKE 1 TABLET BY MOUTH EVERY DAY   cyclobenzaprine 10 MG tablet Commonly known as:  FLEXERIL TAKE 1 TABLET (10 MG TOTAL) BY MOUTH 3 (THREE) TIMES DAILY.   esomeprazole 20 MG capsule Commonly known as:  NEXIUM Take 20 mg by mouth daily.   FERROUS SULFATE PO Take 1 tablet by mouth daily.   Fish Oil 1200 MG Caps Take 1,200 mg by mouth daily.    fluticasone furoate-vilanterol 100-25 MCG/INH Aepb Commonly known as:  BREO ELLIPTA Inhale 1 puff into the lungs daily.   FOLIC ACID PO Take 1 tablet by mouth daily.   gabapentin 100 MG capsule Commonly known as:  NEURONTIN Take 1-3 capsules (100-300 mg total) at bedtime by mouth.   meloxicam 7.5 MG tablet Commonly known as:  MOBIC TAKE 1 TABLET BY MOUTH EVERY DAY   multivitamin tablet Take 1 tablet by mouth daily.   olopatadine 0.1 % ophthalmic solution Commonly known as:  PATANOL Place 1 drop into both eyes 2 (two) times daily.   PROBIOTIC PO Take 1 tablet by mouth daily.   SUPER B COMPLEX PO Take 1 tablet by mouth daily.   traMADol 50 MG tablet Commonly known as:  ULTRAM Take 1 tablet (50 mg total) every 6 (six) hours as needed by mouth.   valACYclovir 1000 MG tablet Commonly known as:  VALTREX Take 1 tablet (1,000 mg total) by mouth 2 (two) times daily.   vitamin C 1000 MG tablet Take 1,000 mg by mouth daily.          Objective:    BP (!) 160/79   Pulse (!) 101   Temp (!) 97 F (36.1 C) (Oral)   Ht 5\' 3"  (1.6 m)   Wt 171 lb (77.6 kg)   BMI 30.29 kg/m   Allergies  Allergen Reactions  . Codeine Shortness Of Breath    Pt reports this was 30 years ago.  Marland Kitchen Lisinopril Cough    Physical Exam  Constitutional: She is oriented to person, place, and time. She appears well-developed and well-nourished.  HENT:  Head: Normocephalic and atraumatic.  Eyes: Conjunctivae and EOM are normal. Pupils are equal, round, and reactive to light.  Cardiovascular: Normal rate, regular rhythm, normal heart sounds and intact distal pulses.  Pulmonary/Chest: Effort normal and breath sounds normal.  Abdominal: Soft. Bowel sounds are normal.  Neurological: She is alert and oriented to person, place, and time. She has normal reflexes.  Skin: Skin is warm and dry. No rash noted.  Psychiatric: She has a normal mood and affect. Her behavior is normal. Judgment and thought  content normal.  Nursing note and vitals reviewed.   Results for orders placed or performed during the hospital encounter of 10/09/16  CBC  Result Value Ref Range   WBC 8.6 4.0 - 10.5 K/uL   RBC 3.48 (L) 3.87 - 5.11 MIL/uL   Hemoglobin 9.7 (L) 12.0 - 15.0 g/dL   HCT 30.7 (L) 36.0 - 46.0 %   MCV 88.2 78.0 - 100.0 fL   MCH 27.9  26.0 - 34.0 pg   MCHC 31.6 30.0 - 36.0 g/dL   RDW 14.1 11.5 - 15.5 %   Platelets 269 150 - 400 K/uL  Basic metabolic panel  Result Value Ref Range   Sodium 135 135 - 145 mmol/L   Potassium 3.9 3.5 - 5.1 mmol/L   Chloride 100 (L) 101 - 111 mmol/L   CO2 27 22 - 32 mmol/L   Glucose, Bld 107 (H) 65 - 99 mg/dL   BUN 19 6 - 20 mg/dL   Creatinine, Ser 0.91 0.44 - 1.00 mg/dL   Calcium 9.0 8.9 - 10.3 mg/dL   GFR calc non Af Amer >60 >60 mL/min   GFR calc Af Amer >60 >60 mL/min   Anion gap 8 5 - 15      Assessment & Plan:   1. 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  2. Chronic pain due to neoplasm    Current Outpatient Medications:  .  acetaminophen (TYLENOL) 325 MG tablet, Take 650 mg by mouth daily., Disp: , Rfl:  .  albuterol (PROVENTIL HFA;VENTOLIN HFA) 108 (90 Base) MCG/ACT inhaler, Inhale 2 puffs into the lungs every 6 (six) hours as needed for wheezing or shortness of breath., Disp: 1 Inhaler, Rfl: 0 .  alendronate (FOSAMAX) 70 MG tablet, Take 1 tablet (70 mg total) by mouth once a week. Take with a full glass of water on an empty stomach. (Patient taking differently: Take 70 mg by mouth once a week. Take with a full glass of water on an empty stomach. On Sundays), Disp: 12 tablet, Rfl: 3 .  ALPRAZolam (XANAX) 0.25 MG tablet, TAKE 1 TABLET BY MOUTH THREE TIMES A DAY AS NEEDED, Disp: 90 tablet, Rfl: 1 .  amLODipine (NORVASC) 10 MG tablet, Take 1 tablet (10 mg total) daily by mouth., Disp: 90 tablet, Rfl: 3 .  anastrozole (ARIMIDEX) 1 MG tablet, Take 1 tablet (1 mg total) by mouth daily., Disp:  90 tablet, Rfl: 3 .  Ascorbic Acid (VITAMIN C) 1000 MG tablet, Take 1,000 mg by mouth daily., Disp: , Rfl:  .  B Complex-C (SUPER B COMPLEX PO), Take 1 tablet by mouth daily., Disp: , Rfl:  .  Biotin (BIOTIN 5000) 5 MG CAPS, Take 5 mg by mouth daily. , Disp: , Rfl:  .  Calcium Carb-Cholecalciferol (CALCIUM 600 + D PO), Take 1 tablet by mouth daily., Disp: , Rfl:  .  citalopram (CELEXA) 20 MG tablet, TAKE 1 TABLET BY MOUTH EVERY DAY, Disp: 90 tablet, Rfl: 0 .  cyclobenzaprine (FLEXERIL) 10 MG tablet, TAKE 1 TABLET (10 MG TOTAL) BY MOUTH 3 (THREE) TIMES DAILY. (Patient taking differently: Take 10 mg by mouth every evening. ), Disp: 60 tablet, Rfl: 2 .  esomeprazole (NEXIUM) 20 MG capsule, Take 20 mg by mouth daily. , Disp: , Rfl:  .  FERROUS SULFATE PO, Take 1 tablet by mouth daily., Disp: , Rfl:  .  fluticasone furoate-vilanterol (BREO ELLIPTA) 100-25 MCG/INH AEPB, Inhale 1 puff into the lungs daily., Disp: 1 each, Rfl: 11 .  FOLIC ACID PO, Take 1 tablet by mouth daily., Disp: , Rfl:  .  meloxicam (MOBIC) 7.5 MG tablet, TAKE 1 TABLET BY MOUTH EVERY DAY, Disp: 30 tablet, Rfl: 1 .  Multiple Vitamin (MULTIVITAMIN) tablet, Take 1 tablet by mouth daily., Disp: , Rfl:  .  Omega-3 Fatty Acids (FISH OIL) 1200 MG CAPS, Take 1,200 mg by mouth daily., Disp: , Rfl:  .  Probiotic  Product (PROBIOTIC PO), Take 1 tablet by mouth daily., Disp: , Rfl:  .  valACYclovir (VALTREX) 1000 MG tablet, Take 1 tablet (1,000 mg total) by mouth 2 (two) times daily., Disp: 20 tablet, Rfl: 6 .  gabapentin (NEURONTIN) 100 MG capsule, Take 1-3 capsules (100-300 mg total) at bedtime by mouth., Disp: 90 capsule, Rfl: 3 .  olopatadine (PATANOL) 0.1 % ophthalmic solution, Place 1 drop into both eyes 2 (two) times daily. (Patient not taking: Reported on 11/19/2016), Disp: 5 mL, Rfl: 12 .  traMADol (ULTRAM) 50 MG tablet, Take 1 tablet (50 mg total) every 6 (six) hours as needed by mouth., Disp: 90 tablet, Rfl: 2 Continue all other  maintenance medications as listed above.  Follow up plan: Return in about 4 weeks (around 12/17/2016) for recheck.  Educational handout given for Herman PA-C [facility] [address]  [place], [place] [address] [phone]   11/19/2016, 2:50 PM

## 2016-11-19 NOTE — Patient Instructions (Signed)
In a few days you may receive a survey in the mail or online from [employer] regarding your visit with us today. Please take a moment to fill this out. Your feedback is very important to our whole office. It can help us better understand your needs as well as improve your experience and satisfaction. Thank you for taking your time to complete it. We care about you.  Makenley Shimp, PA-C  

## 2016-11-20 ENCOUNTER — Other Ambulatory Visit: Payer: Self-pay

## 2016-11-20 MED ORDER — MELOXICAM 7.5 MG PO TABS: 7.5000 mg | ORAL_TABLET | Freq: Every day | ORAL | 0 refills | Status: DC

## 2016-11-23 ENCOUNTER — Telehealth: Payer: Self-pay | Admitting: Physician Assistant

## 2016-11-23 NOTE — Telephone Encounter (Signed)
5 mg of amlodipine done well 10 mg made ankles swell She had HCTZ left over from before - restarted it...  HCTZ 25 - took one a day of these for the last 2 days (with NO amlodipine)  She hasn't checked BP     What should she continue?? She is aware that she will need to get [place] some readings

## 2016-11-23 NOTE — Telephone Encounter (Signed)
Try to restart the amlodipine, even as if it is just at 5 mg along with the hydrochlorothiazide 25 mg.  Recheck blood pressure in the next few days.  May be able to go up to amlodipine 10 mg if she takes a fluid pill every day and still preserve good blood pressure control.

## 2016-11-23 NOTE — Telephone Encounter (Signed)
Pt notified of recommendation Verbalizes understanding 

## 2016-11-27 ENCOUNTER — Other Ambulatory Visit: Payer: Self-pay | Admitting: *Deleted

## 2016-11-27 MED ORDER — ANASTROZOLE 1 MG PO TABS: 1.0000 mg | ORAL_TABLET | Freq: Every day | ORAL | 3 refills | Status: DC

## 2016-11-28 ENCOUNTER — Other Ambulatory Visit: Payer: Self-pay

## 2016-11-28 MED ORDER — ANASTROZOLE 1 MG PO TABS: 1.0000 mg | ORAL_TABLET | Freq: Every day | ORAL | 3 refills | Status: DC

## 2016-12-03 ENCOUNTER — Other Ambulatory Visit: Payer: Self-pay | Admitting: Hematology and Oncology

## 2016-12-04 ENCOUNTER — Telehealth: Payer: Self-pay | Admitting: Physician Assistant

## 2016-12-04 ENCOUNTER — Other Ambulatory Visit: Payer: Self-pay | Admitting: Physician Assistant

## 2016-12-04 MED ORDER — OLMESARTAN MEDOXOMIL-HCTZ 20-12.5 MG PO TABS
1.0000 | ORAL_TABLET | Freq: Every day | ORAL | 5 refills | Status: DC
Start: 2016-12-04 — End: 2017-03-20

## 2016-12-04 NOTE — Telephone Encounter (Signed)
benicar hctz has been sent to [facility]

## 2016-12-04 NOTE — Telephone Encounter (Signed)
Stop AMLODIPINE, which is a blood pressure medication.  I will send something else in to replace it. Keep followup and we will check BP then.

## 2016-12-04 NOTE — Telephone Encounter (Signed)
Patient aware.

## 2016-12-05 ENCOUNTER — Ambulatory Visit: Payer: BLUE CROSS/BLUE SHIELD | Admitting: Allergy and Immunology

## 2016-12-06 ENCOUNTER — Other Ambulatory Visit: Payer: Self-pay

## 2016-12-06 MED ORDER — ALENDRONATE SODIUM 70 MG PO TABS: ORAL_TABLET | ORAL | 2 refills | Status: DC

## 2016-12-12 ENCOUNTER — Ambulatory Visit (INDEPENDENT_AMBULATORY_CARE_PROVIDER_SITE_OTHER): Payer: BLUE CROSS/BLUE SHIELD | Admitting: Allergy and Immunology

## 2016-12-12 ENCOUNTER — Encounter: Payer: Self-pay | Admitting: Allergy and Immunology

## 2016-12-12 VITALS — BP 160/68 | HR 87 | Temp 98.2°F | Resp 12 | Ht 63.0 in | Wt 167.0 lb

## 2016-12-12 DIAGNOSIS — J3089 Other allergic rhinitis: Secondary | ICD-10-CM | POA: Diagnosis not present

## 2016-12-12 DIAGNOSIS — J4540 Moderate persistent asthma, uncomplicated: Secondary | ICD-10-CM | POA: Diagnosis not present

## 2016-12-12 DIAGNOSIS — K219 Gastro-esophageal reflux disease without esophagitis: Secondary | ICD-10-CM | POA: Diagnosis not present

## 2016-12-12 MED ORDER — BUDESONIDE-FORMOTEROL FUMARATE 160-4.5 MCG/ACT IN AERO: INHALATION_SPRAY | RESPIRATORY_TRACT | 5 refills | Status: DC

## 2016-12-12 MED ORDER — RANITIDINE HCL 150 MG PO TABS: ORAL_TABLET | ORAL | 5 refills | Status: DC

## 2016-12-12 MED ORDER — CARBINOXAMINE MALEATE 6 MG PO TABS: 1.0000 | ORAL_TABLET | Freq: Four times a day (QID) | ORAL | 5 refills | Status: DC | PRN

## 2016-12-12 MED ORDER — ALBUTEROL SULFATE HFA 108 (90 BASE) MCG/ACT IN AERS: 2.0000 | INHALATION_SPRAY | RESPIRATORY_TRACT | 1 refills | Status: DC | PRN

## 2016-12-12 MED ORDER — AZELASTINE HCL 0.15 % NA SOLN: NASAL | 5 refills | Status: DC

## 2016-12-12 NOTE — Assessment & Plan Note (Signed)
   Aeroallergen avoidance measures have been discussed and provided in written form.  A prescription has been provided for azelastine nasal spray, one spray per nostril 1-2 times daily as needed. Proper nasal spray technique has been discussed and demonstrated.  Nasal saline lavage (NeilMed) has been recommended as needed and prior to medicated nasal sprays along with instructions for proper administration.  A prescription has been provided for RyVent (carbinoxamine maleate) 6mg  every 6-8 hours as needed.

## 2016-12-12 NOTE — Assessment & Plan Note (Signed)
Currently with suboptimal control.  Appropriate reflux lifestyle modifications have been discussed and provided in written form.  For now, continue Nexium as prescribed.  Add ranitidine 150 mg twice daily as needed.

## 2016-12-12 NOTE — Patient Instructions (Addendum)
Moderate persistent asthma Todays spirometry results, assessed while asymptomatic, suggest under-perception of bronchoconstriction.  A prescription has been provided for Symbicort (budesonide/formoterol) 160/4.5 g,  2 inhalations twice a day. To maximize pulmonary deposition, a spacer has been provided along with instructions for its proper administration with an HFA inhaler.  Continue albuterol HFA, 1-2 inhalations every 4-6 hours as needed.  Subjective and objective measures of pulmonary function will be followed and the treatment plan will be adjusted accordingly.  Perennial allergic rhinitis with a probable nonallergic component  Aeroallergen avoidance measures have been discussed and provided in written form.  A prescription has been provided for azelastine nasal spray, one spray per nostril 1-2 times daily as needed. Proper nasal spray technique has been discussed and demonstrated.  Nasal saline lavage (NeilMed) has been recommended as needed and prior to medicated nasal sprays along with instructions for proper administration.  A prescription has been provided for RyVent (carbinoxamine maleate) 6mg  every 6-8 hours as needed.  GERD (gastroesophageal reflux disease) Currently with suboptimal control.  Appropriate reflux lifestyle modifications have been discussed and provided in written form.  For now, continue Nexium as prescribed.  Add ranitidine 150 mg twice daily as needed.   Return in about 3 months (around 03/12/2017), or if symptoms worsen or fail to improve.  Control of Mold Allergen  Mold and fungi can grow on a variety of surfaces provided certain temperature and moisture conditions exist.  Outdoor molds grow on plants, decaying vegetation and soil.  The major outdoor mold, Alternaria and Cladosporium, are found in very high numbers during hot and dry conditions.  Generally, a late Summer - Fall peak is seen for common outdoor fungal spores.  Rain will temporarily  lower outdoor mold spore count, but counts rise rapidly when the rainy period ends.  The most important indoor molds are Aspergillus and Penicillium.  Dark, humid and poorly ventilated basements are ideal sites for mold growth.  The next most common sites of mold growth are the bathroom and the kitchen.  Outdoor [employer] 2. Use air conditioning and keep windows closed 3. Avoid exposure to decaying vegetation. 4. Avoid leaf raking. 5. Avoid grain handling. 6. Consider wearing a face mask if working in moldy areas.  Indoor Mold Control 1. Maintain humidity below 50%. 2. Clean washable surfaces with 5% bleach solution. 3. Remove sources e.g. Contaminated carpets.  Control of House Dust Mite Allergen  House dust mites play a major role in allergic asthma and rhinitis.  They occur in environments with high humidity wherever human skin, the food for dust mites is found. High levels have been detected in dust obtained from mattresses, pillows, carpets, upholstered furniture, bed covers, clothes and soft toys.  The principal allergen of the house dust mite is found in its feces.  A gram of dust may contain 1,000 mites and 250,000 fecal particles.  Mite antigen is easily measured in the air during house cleaning activities.    1. Encase mattresses, including the box spring, and pillow, in an air tight cover.  Seal the zipper end of the encased mattresses with wide adhesive tape. 2. Wash the bedding in water of 130 degrees Farenheit weekly.  Avoid cotton comforters/quilts and flannel bedding: the most ideal bed covering is the dacron comforter. 3. Remove all upholstered furniture from the bedroom. 4. Remove carpets, carpet padding, rugs, and non-washable window drapes from the bedroom.  Wash drapes weekly or use plastic window coverings. 5. Remove all non-washable stuffed toys from the bedroom.  Wash stuffed toys weekly. 6. Have the room cleaned frequently with a vacuum cleaner and a damp dust-mop.   The patient should not be in a room which is being cleaned and should wait 1 hour after cleaning before going into the room. 7. Close and seal all heating outlets in the bedroom.  Otherwise, the room will become filled with dust-laden air.  An electric heater can be used to heat the room. 8. Reduce indoor humidity to less than 50%.  Do not use a humidifier.  Lifestyle Changes for Controlling GERD  When you have GERD, stomach acid feels as if it's backing up toward your mouth. Whether or not you take medication to control your GERD, your symptoms can often be improved with lifestyle changes.   Raise Your Head  Reflux is more likely to strike when you're lying down flat, because stomach fluid can  flow backward more easily. Raising the head of your bed 4-6 inches can help. To do this:  Slide blocks or books under the legs at the head of your bed. Or, place a wedge under  the mattress. Many foam stores can make a suitable wedge for you. The wedge  should run from your waist to the top of your head.  Don't just prop your head on several pillows. This increases pressure on your  stomach. It can make GERD worse.  Watch Your Eating Habits Certain foods may increase the acid in your stomach or relax the lower esophageal sphincter, making GERD more likely. It's best to avoid the following:  Coffee, tea, and carbonated drinks (with and without caffeine)  Fatty, fried, or spicy food  Mint, chocolate, onions, and tomatoes  Any other foods that seem to irritate your stomach or cause you pain  Relieve the Pressure  Eat smaller meals, even if you have to eat more often.  Don't lie down right after you eat. Wait a few hours for your stomach to empty.  Avoid tight belts and tight-fitting clothes.  Lose excess weight.  Tobacco and Alcohol  Avoid smoking tobacco and drinking alcohol. They can make GERD symptoms worse.

## 2016-12-12 NOTE — Progress Notes (Signed)
New Patient Note  RE: Heidi Walls MRN: [id number] DOB: Dec 30, 1955 Date of Office Visit: 12/12/2016  Referring provider: Terald Sleeper, PA-C Primary care provider: Terald Sleeper, PA-C  Chief Complaint: Cough; Asthma; and Nasal Congestion   History of present illness: Heidi Walls is a 61 y.o. female seen today in consultation requested by Particia Nearing, PA-C.  She reports that she underwent chemotherapy for breast cancer between July 2017 and July 2018.  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  She has experienced asthma-like symptoms over this past year which have improved with the use of Brio Ellipta 100/25 g, 1 inhalation daily, and Pro Air HFA as needed.  She still experiences coughing episodes, however reports that she rarely requires albuterol rescue.   Heidi Walls experiences nasal congestion, rhinorrhea, sneezing, postnasal drainage, ocular pruritus, and occasional sinus pressure over the cheekbones.  She reports that she has taken cetirizine daily for the past year and a half.  These nasal, sinus, and ocular symptoms occur year around but are most frequent and severe during the springtime. She reports that, despite compliance with Nexium daily, recently she has been experiencing heartburn rather frequently.    Assessment and plan: Moderate persistent asthma Todays spirometry results, assessed while asymptomatic, suggest under-perception of bronchoconstriction.  A prescription has been provided for Symbicort (budesonide/formoterol) 160/4.5 g,  2 inhalations twice a day. To maximize pulmonary deposition, a spacer has been provided along with instructions  for its proper administration with an HFA inhaler.  Continue albuterol HFA, 1-2 inhalations every 4-6 hours as needed.  Subjective and objective measures of pulmonary function will be followed and the treatment plan will be adjusted accordingly.  Perennial allergic rhinitis with a probable nonallergic component  Aeroallergen avoidance measures have been discussed and provided in written form.  A prescription has been provided for azelastine nasal spray, one spray per nostril 1-2 times daily as needed. Proper nasal spray technique has been discussed and demonstrated.  Nasal saline lavage (NeilMed) has been recommended as needed and prior to medicated nasal sprays along with instructions for proper administration.  A prescription has been provided for RyVent (carbinoxamine maleate) 6mg  every 6-8 hours as needed.  GERD (gastroesophageal reflux disease) Currently with suboptimal control.  Appropriate reflux lifestyle modifications have been discussed and provided in written form.  For now, continue Nexium as prescribed.  Add ranitidine 150 mg twice daily as needed.   Meds ordered this encounter  Medications  . budesonide-formoterol (SYMBICORT) 160-4.5 MCG/ACT inhaler    Sig: Two puffs with spacer twice a day to prevent cough or wheeze.    Dispense:  1 Inhaler    Refill:  5  . albuterol (PROVENTIL HFA;VENTOLIN HFA) 108 (90 Base) MCG/ACT inhaler    Sig: Inhale 2 puffs into the lungs every 4 (four) hours as needed for wheezing or shortness of breath.    Dispense:  1 Inhaler    Refill:  1  . Azelastine HCl 0.15 % SOLN    Sig: One spray each nostril 1-2 times a day as needed    Dispense:  30 mL    Refill:  5  . Carbinoxamine Maleate (RYVENT) 6 MG TABS  Sig: Take 1 tablet by mouth every 6 (six) hours as needed (or every 8 hours as needed).    Dispense:  30 tablet    Refill:  5    Patient has coupon.  Run as cash pay.  . ranitidine (ZANTAC) 150 MG tablet    Sig: One tablet twice  daily as needed.    Dispense:  60 tablet    Refill:  5    Diagnostics: Spirometry: FVC was 2.53 L and FEV1 was 2.08 L (85% predicted) with significant (260 mL, 13%) postbronchodilator improvement.  This study was performed while the patient was asymptomatic.  Please see scanned spirometry results for details. Epicutaneous testing: Negative despite a positive histamine control. Intradermal testing: Positive to molds and dust mite antigen.    Physical examination: Blood pressure (!) 160/68, pulse 87, temperature 98.2 F (36.8 C), temperature source Oral, resp. rate 12, height 5\' 3"  (1.6 m), weight 167 lb (75.8 kg), SpO2 94 %.  General: Alert, interactive, in no acute distress. HEENT: TMs pearly gray, turbinates edematous without discharge, post-pharynx moderately erythematous. Neck: Supple without lymphadenopathy. Lungs: Clear to auscultation without wheezing, rhonchi or rales. CV: Normal S1, S2 without murmurs. Abdomen: Nondistended, nontender. Skin: Warm and dry, without lesions or rashes. Extremities:  No clubbing, cyanosis or edema. Neuro:   Grossly intact.  Review of systems:  Review of systems negative except as noted in HPI / PMHx or noted below: Review of Systems  Constitutional: Negative.   HENT: Negative.   Eyes: Negative.   Respiratory: Negative.   Cardiovascular: Negative.   Gastrointestinal: Negative.   Genitourinary: Negative.   Musculoskeletal: Negative.   Skin: Negative.   Neurological: Negative.   Endo/Heme/Allergies: Negative.   Psychiatric/Behavioral: Negative.     Past medical history:  Past Medical History:  Diagnosis Date  . Anemia   . Anxiety    OCD  . Arthritis    knees and hips  . Asthma   . Breast cancer ([place])   . Breast cancer of upper-inner quadrant of left female breast ([place]) 05/26/2015  . GERD (gastroesophageal reflux disease)   . Hypertension   . Insomnia     Past surgical history:  Past Surgical History:  Procedure Laterality  Date  . BREAST RECONSTRUCTION WITH PLACEMENT OF TISSUE EXPANDER AND FLEX HD (ACELLULAR HYDRATED DERMIS) Left 06/24/2015   Procedure: LEFT BREAST RECONSTRUCTION WITH PLACEMENT OF TISSUE EXPANDER AND  ACELLULAR HYDRATED DERMIS (CORTIVA);  Surgeon: Irene Limbo, MD;  Location: [facility];  Service: Plastics;  Laterality: Left;  . KNEE SURGERY Right 2000   arthroscopy  . LIPOSUCTION WITH LIPOFILLING Left 12/23/2015   Procedure: LIPOFILLING TO LEFT CHEST FROM ABDOMEN  ;  Surgeon: Irene Limbo, MD;  Location: [facility];  Service: Plastics;  Laterality: Left;  Marland Kitchen MASTECTOMY W/ SENTINEL NODE BIOPSY Left 06/24/2015   Procedure: LEFT TOTAL MASTECTOMY WITH SENTINEL LYMPH NODE BIOPSY;  Surgeon: Excell Seltzer, MD;  Location: [facility];  Service: General;  Laterality: Left;  Marland Kitchen MASTOPEXY Right 12/23/2015   Procedure: RIGHT BREAST MASTOPEXY;  Surgeon: Irene Limbo, MD;  Location: [facility];  Service: Plastics;  Laterality: Right;  . MICROLARYNGOSCOPY W/VOCAL CORD INJECTION N/A 10/15/2016   Procedure: SUSPENDED DIRECT MICROLARYNGOSCOPY WITH VOCAL CORD INJECTION WITH JET VENTILATION;  Surgeon: Melida Quitter, MD;  Location: [facility];  Service: ENT;  Laterality: N/A;  . PORTACATH PLACEMENT Right 06/24/2015   Procedure: INSERTION PORT-A-CATH;  Surgeon: Excell Seltzer, MD;  Location: [facility];  Service: General;  Laterality: Right;  . portacath removal  09/05/2016  . REMOVAL OF BILATERAL TISSUE EXPANDERS WITH PLACEMENT OF BILATERAL BREAST IMPLANTS Left 12/23/2015   Procedure: REMOVAL OF LEFT  TISSUE EXPANDERS WITH PLACEMENT OF LEFT  BREAST IMPLANTS;  Surgeon: Irene Limbo, MD;  Location: [facility];  Service: Plastics;  Laterality: Left;  . TONSILLECTOMY      Family history: Family History  Problem Relation Age of Onset  . Heart failure Mother   . Diabetes Mother   . Hypertension Mother   . Hypertension  Father   . Breast cancer Maternal Grandmother   . Allergic rhinitis Sister   . Sinusitis Sister   . Allergic rhinitis Daughter   . Allergic rhinitis Son   . Sinusitis Son   . Bronchitis Grandchild   . Asthma Neg Hx   . Eczema Neg Hx   . Urticaria Neg Hx   . Immunodeficiency Neg Hx   . Angioedema Neg Hx     Social history: Social History   Socioeconomic History  . Marital status: Married    Spouse name: Not on file  . Number of children: Not on file  . Years of education: Not on file  . Highest education level: Not on file  Social Needs  . Financial resource strain: Not on file  . Food insecurity - worry: Not on file  . Food insecurity - inability: Not on file  . Transportation needs - medical: Not on file  . Transportation needs - non-medical: Not on file  Occupational History  . Not on file  Tobacco Use  . Smoking status: Former Smoker    Types: Cigarettes    Last attempt to quit: 10/09/2005    Years since quitting: 11.1  . Smokeless tobacco: Never Used  Substance and Sexual Activity  . Alcohol use: No    Frequency: Never  . Drug use: No  . Sexual activity: Not on file  Other Topics Concern  . Not on file  Social History Narrative  . Not on file   Environmental History: The patient lives in a house built in 1978 with carpeting throughout and central air/heat.  There are cats and a dog in the house which do not have access to her bedroom.  There is no known mold/water damage in the home.  She is a former cigarette smoker having quit in early 2007.  Allergies as of 12/12/2016      Reactions   Codeine Shortness Of Breath   Pt reports this was 30 years ago.   Lisinopril Cough      Medication List        Accurate as of 12/12/16  5:22 PM. Always use your most recent med list.          acetaminophen 325 MG tablet Commonly known as:  TYLENOL Take 650 mg by mouth daily.   Acidophilus Tabs Take by mouth.   albuterol 108 (90 Base) MCG/ACT inhaler Commonly  known as:  PROVENTIL HFA;VENTOLIN HFA Inhale 2 puffs into the lungs every 4 (four) hours as needed for wheezing or shortness of breath.   alendronate 70 MG tablet Commonly known as:  FOSAMAX TAKE 1 TABLET BY MOUTH ONCE WEEKLY WITH A FULL GLASS OF WATER ON AN EMPTY STOMACH   ALPRAZolam 0.25 MG tablet Commonly known as:  XANAX TAKE 1 TABLET BY MOUTH THREE TIMES A DAY AS NEEDED   anastrozole 1 MG tablet Commonly known as:  ARIMIDEX Take 1 tablet (  1 mg total) by mouth daily.   Azelastine HCl 0.15 % Soln One spray each nostril 1-2 times a day as needed   BIOTIN 5000 5 MG Caps Generic drug:  Biotin Take 5 mg by mouth daily.   budesonide-formoterol 160-4.5 MCG/ACT inhaler Commonly known as:  SYMBICORT Two puffs with spacer twice a day to prevent cough or wheeze.   CALCIUM 600 + D PO Take 1 tablet by mouth daily.   Carbinoxamine Maleate 6 MG Tabs Commonly known as:  RYVENT Take 1 tablet by mouth every 6 (six) hours as needed (or every 8 hours as needed).   citalopram 20 MG tablet Commonly known as:  CELEXA TAKE 1 TABLET BY MOUTH EVERY DAY   cyclobenzaprine 10 MG tablet Commonly known as:  FLEXERIL TAKE 1 TABLET (10 MG TOTAL) BY MOUTH 3 (THREE) TIMES DAILY.   esomeprazole 20 MG capsule Commonly known as:  NEXIUM Take 20 mg by mouth daily.   FERROUS SULFATE PO Take 1 tablet by mouth daily.   Fish Oil 1200 MG Caps Take 1,200 mg by mouth daily.   fluticasone furoate-vilanterol 100-25 MCG/INH Aepb Commonly known as:  BREO ELLIPTA Inhale 1 puff into the lungs daily.   FOLIC ACID PO Take 1 tablet by mouth daily.   gabapentin 100 MG capsule Commonly known as:  NEURONTIN Take 1-3 capsules (100-300 mg total) at bedtime by mouth.   hydrochlorothiazide 25 MG tablet Commonly known as:  HYDRODIURIL Take 25 mg by mouth daily.   meloxicam 7.5 MG tablet Commonly known as:  MOBIC Take 1 tablet (7.5 mg total) daily by mouth.   multivitamin tablet Take 1 tablet by mouth  daily.   olmesartan-hydrochlorothiazide 20-12.5 MG tablet Commonly known as:  BENICAR HCT Take 1 tablet by mouth daily.   olopatadine 0.1 % ophthalmic solution Commonly known as:  PATANOL Place 1 drop into both eyes 2 (two) times daily.   PROBIOTIC PO Take 1 tablet by mouth daily.   ranitidine 150 MG tablet Commonly known as:  ZANTAC One tablet twice daily as needed.   SUPER B COMPLEX PO Take 1 tablet by mouth daily.   traMADol 50 MG tablet Commonly known as:  ULTRAM Take 1 tablet (50 mg total) every 6 (six) hours as needed by mouth.   valACYclovir 1000 MG tablet Commonly known as:  VALTREX Take 1 tablet (1,000 mg total) by mouth 2 (two) times daily.   vitamin C 1000 MG tablet Take 1,000 mg by mouth daily.       Known medication allergies: Allergies  Allergen Reactions  . Codeine Shortness Of Breath    Pt reports this was 30 years ago.  Marland Kitchen Lisinopril Cough    I appreciate the opportunity to take part in Perle's care. Please do not hesitate to contact me with questions.  Sincerely,   R. Edgar Frisk, MD

## 2016-12-12 NOTE — Assessment & Plan Note (Addendum)
Todays spirometry results, assessed while asymptomatic, suggest under-perception of bronchoconstriction.  A prescription has been provided for Symbicort (budesonide/formoterol) 160/4.5 g, 2 inhalations twice a day. To maximize pulmonary deposition, a spacer has been provided along with instructions for its proper administration with an HFA inhaler.  Continue albuterol HFA, 1-2 inhalations every 4-6 hours as needed.  Subjective and objective measures of pulmonary function will be followed and the treatment plan will be adjusted accordingly.

## 2016-12-14 ENCOUNTER — Telehealth: Payer: Self-pay | Admitting: Allergy and Immunology

## 2016-12-14 ENCOUNTER — Telehealth: Payer: Self-pay | Admitting: *Deleted

## 2016-12-14 DIAGNOSIS — J3089 Other allergic rhinitis: Secondary | ICD-10-CM

## 2016-12-14 MED ORDER — ALBUTEROL SULFATE HFA 108 (90 BASE) MCG/ACT IN AERS: 2.0000 | INHALATION_SPRAY | RESPIRATORY_TRACT | 2 refills | Status: DC | PRN

## 2016-12-14 NOTE — Telephone Encounter (Signed)
Pt calling because Symbicort is not covered. Checked with the pharmacy and Symbicort is covered $0 copay. Called pt back to let her know and she explained it must have been the proventil that wasn't covered, sent in proair instead.

## 2016-12-14 NOTE — Telephone Encounter (Signed)
Received fax that [facility] is no longer able to order Ryvent from wholesaler. Called and left message for patient to call office to see if there is another pharmacy she would like this rx sent to.

## 2016-12-18 ENCOUNTER — Ambulatory Visit: Payer: BLUE CROSS/BLUE SHIELD | Admitting: Physician Assistant

## 2016-12-18 NOTE — Telephone Encounter (Signed)
Called and left message regarding this matter.

## 2016-12-19 NOTE — Telephone Encounter (Signed)
Left message to call office

## 2016-12-21 ENCOUNTER — Telehealth: Payer: Self-pay | Admitting: *Deleted

## 2016-12-21 MED ORDER — FLUCONAZOLE 150 MG PO TABS: 150.0000 mg | ORAL_TABLET | Freq: Once | ORAL | 1 refills | Status: AC

## 2016-12-21 NOTE — Telephone Encounter (Signed)
Heidi Walls called and stated that since Dr. Verlin Fester had switched her to Symbicort she has developed thrush. Per Dr. Ernst Bowler in clinic stated to send in [place] with 1 refill.

## 2016-12-25 ENCOUNTER — Other Ambulatory Visit: Payer: Self-pay

## 2016-12-25 DIAGNOSIS — M25562 Pain in left knee: Secondary | ICD-10-CM | POA: Diagnosis not present

## 2016-12-25 DIAGNOSIS — M660 Rupture of popliteal cyst: Secondary | ICD-10-CM | POA: Diagnosis not present

## 2016-12-25 MED ORDER — NYSTATIN 100000 UNIT/ML MT SUSP: 5.0000 mL | Freq: Four times a day (QID) | OROMUCOSAL | 0 refills | Status: DC

## 2016-12-25 MED ORDER — CARBINOXAMINE MALEATE 6 MG PO TABS
1.0000 | ORAL_TABLET | Freq: Four times a day (QID) | ORAL | 5 refills | Status: DC | PRN
Start: 2016-12-25 — End: 2017-03-20

## 2016-12-25 NOTE — Telephone Encounter (Signed)
[phone] is patient's cell phone number. She has been advised that we need to send the Ryvent to a different pharmacy and that has been done. Also, she is still battling the thrush. States that the diflucan has not touched the thrush. She would like to know if you have any further recommendations.

## 2016-12-25 NOTE — Addendum Note (Signed)
Addended by: Lucrezia Starch I on: 12/25/2016 02:37 PM   Modules accepted: Orders

## 2016-12-25 NOTE — Telephone Encounter (Signed)
Left message for patient. Sending letter.

## 2016-12-25 NOTE — Telephone Encounter (Signed)
That is odd. We can send in nystatin suspension one swish and swallow four times daily for one week to see if that might work better. Please send in script.   Salvatore Marvel, MD [facility]

## 2016-12-25 NOTE — Telephone Encounter (Signed)
Sent in rx and informed the pt of me doing so

## 2016-12-27 ENCOUNTER — Telehealth: Payer: Self-pay

## 2016-12-27 DIAGNOSIS — J383 Other diseases of vocal cords: Secondary | ICD-10-CM | POA: Diagnosis not present

## 2016-12-27 DIAGNOSIS — R490 Dysphonia: Secondary | ICD-10-CM | POA: Diagnosis not present

## 2016-12-27 NOTE — Telephone Encounter (Signed)
Per patient, insurance will not pay for Ryvent  Patient cost $160.  Coupon info was sent with Rx.  Told [facility] WITH THE COUPON INFO AND SHOULD GO THRU FOR $10. Patient will call pharmacy with information and call us if problem.

## 2017-01-02 ENCOUNTER — Encounter: Payer: Self-pay | Admitting: Physician Assistant

## 2017-01-02 ENCOUNTER — Ambulatory Visit: Payer: BLUE CROSS/BLUE SHIELD | Admitting: Physician Assistant

## 2017-01-02 VITALS — BP 109/69 | HR 82 | Temp 97.3°F | Ht 63.0 in | Wt 165.0 lb

## 2017-01-02 DIAGNOSIS — Z170 Estrogen receptor positive status [ER+]: Secondary | ICD-10-CM

## 2017-01-02 DIAGNOSIS — F331 Major depressive disorder, recurrent, moderate: Secondary | ICD-10-CM | POA: Diagnosis not present

## 2017-01-02 DIAGNOSIS — C50212 Malignant neoplasm of upper-inner quadrant of left female breast: Secondary | ICD-10-CM

## 2017-01-02 DIAGNOSIS — J40 Bronchitis, not specified as acute or chronic: Secondary | ICD-10-CM | POA: Diagnosis not present

## 2017-01-02 DIAGNOSIS — M7122 Synovial cyst of popliteal space [Baker], left knee: Secondary | ICD-10-CM

## 2017-01-02 DIAGNOSIS — G893 Neoplasm related pain (acute) (chronic): Secondary | ICD-10-CM

## 2017-01-02 MED ORDER — CITALOPRAM HYDROBROMIDE 40 MG PO TABS: 40.0000 mg | ORAL_TABLET | Freq: Every day | ORAL | 11 refills | Status: DC

## 2017-01-02 MED ORDER — GABAPENTIN 300 MG PO CAPS: 300.0000 mg | ORAL_CAPSULE | Freq: Three times a day (TID) | ORAL | 2 refills | Status: DC

## 2017-01-03 ENCOUNTER — Other Ambulatory Visit: Payer: Self-pay | Admitting: *Deleted

## 2017-01-04 ENCOUNTER — Other Ambulatory Visit: Payer: Self-pay

## 2017-01-04 DIAGNOSIS — F331 Major depressive disorder, recurrent, moderate: Secondary | ICD-10-CM

## 2017-01-04 MED ORDER — CITALOPRAM HYDROBROMIDE 40 MG PO TABS: 40.0000 mg | ORAL_TABLET | Freq: Every day | ORAL | 3 refills | Status: DC

## 2017-01-04 NOTE — Progress Notes (Signed)
BP 109/69   Pulse 82   Temp (!) 97.3 F (36.3 C) (Oral)   Ht 5\' 3"  (1.6 m)   Wt 165 lb (74.8 kg)   BMI 29.23 kg/m    Subjective:    Patient ID: Heidi Walls, female    DOB: [birth year], 61 y.o.   MRN: [id number]  HPI: Heidi Walls is a 61 y.o. female presenting on 01/02/2017 for Peripheral Neuropathy (recheck)  Patient comes in for recheck today on her peripheral neuropathy related to her chemotherapy.  States she can tell a difference with the Neurontin.  We have made a plan to try to progress this to 900 mg daily.  She also is having more depression at this time of year along with still dealing with the cancer.    She is also had a new issue of a Baker's cyst on the posterior left knee.  She had one in her right leg many years ago and had surgery.  She does have known osteoarthritic changes in her joints.  She had The knee protected with knee immobilizer that urgent care placed on her.  She is also recovering from a recent bronchitis.  She will complete her medications in the next few days.  Relevant past medical, surgical, family and social history reviewed and updated as indicated. Allergies and medications reviewed and updated.  Past Medical History:  Diagnosis Date  . Anemia   . Anxiety    OCD  . Arthritis    knees and hips  . Asthma   . Breast cancer ([place])   . Breast cancer of upper-inner quadrant of left female breast ([place]) 05/26/2015  . GERD (gastroesophageal reflux disease)   . Hypertension   . Insomnia     Past Surgical History:  Procedure Laterality Date  . BREAST RECONSTRUCTION WITH PLACEMENT OF TISSUE EXPANDER AND FLEX HD (ACELLULAR HYDRATED DERMIS) Left 06/24/2015   Procedure: LEFT BREAST RECONSTRUCTION WITH PLACEMENT OF TISSUE EXPANDER AND  ACELLULAR HYDRATED DERMIS (CORTIVA);  Surgeon: Irene Limbo, MD;  Location: [facility];  Service: Plastics;  Laterality: Left;  . KNEE SURGERY Right 2000   arthroscopy  . LIPOSUCTION WITH  LIPOFILLING Left 12/23/2015   Procedure: LIPOFILLING TO LEFT CHEST FROM ABDOMEN  ;  Surgeon: Irene Limbo, MD;  Location: [facility];  Service: Plastics;  Laterality: Left;  Marland Kitchen MASTECTOMY W/ SENTINEL NODE BIOPSY Left 06/24/2015   Procedure: LEFT TOTAL MASTECTOMY WITH SENTINEL LYMPH NODE BIOPSY;  Surgeon: Excell Seltzer, MD;  Location: [facility];  Service: General;  Laterality: Left;  Marland Kitchen MASTOPEXY Right 12/23/2015   Procedure: RIGHT BREAST MASTOPEXY;  Surgeon: Irene Limbo, MD;  Location: [facility];  Service: Plastics;  Laterality: Right;  . MICROLARYNGOSCOPY W/VOCAL CORD INJECTION N/A 10/15/2016   Procedure: SUSPENDED DIRECT MICROLARYNGOSCOPY WITH VOCAL CORD INJECTION WITH JET VENTILATION;  Surgeon: Melida Quitter, MD;  Location: [facility];  Service: ENT;  Laterality: N/A;  . PORTACATH PLACEMENT Right 06/24/2015   Procedure: INSERTION PORT-A-CATH;  Surgeon: Excell Seltzer, MD;  Location: [facility];  Service: General;  Laterality: Right;  . portacath removal  09/05/2016  . REMOVAL OF BILATERAL TISSUE EXPANDERS WITH PLACEMENT OF BILATERAL BREAST IMPLANTS Left 12/23/2015   Procedure: REMOVAL OF LEFT  TISSUE EXPANDERS WITH PLACEMENT OF LEFT  BREAST IMPLANTS;  Surgeon: Irene Limbo, MD;  Location: [facility];  Service: Plastics;  Laterality: Left;  . TONSILLECTOMY      Review of Systems  Constitutional:  Positive for fatigue. Negative for activity change, appetite change, chills and fever.  HENT: Positive for congestion, postnasal drip and sore throat.   Eyes: Negative.   Respiratory: Positive for cough. Negative for shortness of breath and wheezing.   Cardiovascular: Negative.  Negative for chest pain, palpitations and leg swelling.  Gastrointestinal: Negative.   Genitourinary: Negative.   Musculoskeletal: Positive for arthralgias, gait problem and joint swelling.  Skin: Negative.   Neurological: Positive for  headaches.    Allergies as of 01/02/2017      Reactions   Codeine Shortness Of Breath   Pt reports this was 30 years ago. Pt reports this was 30 years ago.   Lisinopril Cough      Medication List        Accurate as of 01/02/17 11:59 PM. Always use your most recent med list.          acetaminophen 325 MG tablet Commonly known as:  TYLENOL Take 650 mg by mouth daily.   Acidophilus Tabs Take by mouth.   albuterol 108 (90 Base) MCG/ACT inhaler Commonly known as:  PROAIR HFA Inhale 2 puffs into the lungs every 4 (four) hours as needed for wheezing or shortness of breath.   alendronate 70 MG tablet Commonly known as:  FOSAMAX TAKE 1 TABLET BY MOUTH ONCE WEEKLY WITH A FULL GLASS OF WATER ON AN EMPTY STOMACH   ALPRAZolam 0.25 MG tablet Commonly known as:  XANAX TAKE 1 TABLET BY MOUTH THREE TIMES A DAY AS NEEDED   anastrozole 1 MG tablet Commonly known as:  ARIMIDEX Take 1 tablet (1 mg total) by mouth daily.   Azelastine HCl 0.15 % Soln One spray each nostril 1-2 times a day as needed   CALCIUM 600 + D PO Take 1 tablet by mouth daily.   Carbinoxamine Maleate 6 MG Tabs Commonly known as:  RYVENT Take 1 tablet by mouth every 6 (six) hours as needed (or every 8 hours as needed).   citalopram 40 MG tablet Commonly known as:  CELEXA Take 1 tablet (40 mg total) by mouth daily.   esomeprazole 20 MG capsule Commonly known as:  NEXIUM Take 20 mg by mouth daily.   FERROUS SULFATE PO Take 1 tablet by mouth daily.   Fish Oil 1200 MG Caps Take 1,200 mg by mouth daily.   FOLIC ACID PO Take 1 tablet by mouth daily.   gabapentin 300 MG capsule Commonly known as:  NEURONTIN Take 1-3 capsules (300-900 mg total) by mouth 3 (three) times daily.   hydrochlorothiazide 25 MG tablet Commonly known as:  HYDRODIURIL Take 25 mg by mouth daily.   meloxicam 7.5 MG tablet Commonly known as:  MOBIC Take 1 tablet (7.5 mg total) daily by mouth.   multivitamin tablet Take 1  tablet by mouth daily.   nystatin 100000 UNIT/ML suspension Commonly known as:  MYCOSTATIN Take 5 mLs (500,000 Units total) by mouth 4 (four) times daily.   olmesartan-hydrochlorothiazide 20-12.5 MG tablet Commonly known as:  BENICAR HCT Take 1 tablet by mouth daily.   olopatadine 0.1 % ophthalmic solution Commonly known as:  PATANOL Place 1 drop into both eyes 2 (two) times daily.   PROBIOTIC PO Take 1 tablet by mouth daily.   ranitidine 150 MG tablet Commonly known as:  ZANTAC One tablet twice daily as needed.   SUPER B COMPLEX PO Take 1 tablet by mouth daily.   SYMBICORT 160-4.5 MCG/ACT inhaler Generic drug:  budesonide-formoterol Inhale into the lungs.   traMADol  50 MG tablet Commonly known as:  ULTRAM Take 1 tablet (50 mg total) every 6 (six) hours as needed by mouth.   valACYclovir 1000 MG tablet Commonly known as:  VALTREX Take 1 tablet (1,000 mg total) by mouth 2 (two) times daily.   vitamin C 1000 MG tablet Take 1,000 mg by mouth daily.          Objective:    BP 109/69   Pulse 82   Temp (!) 97.3 F (36.3 C) (Oral)   Ht 5\' 3"  (1.6 m)   Wt 165 lb (74.8 kg)   BMI 29.23 kg/m   Allergies  Allergen Reactions  . Codeine Shortness Of Breath    Pt reports this was 30 years ago. Pt reports this was 30 years ago.  Marland Kitchen Lisinopril Cough    Physical Exam  Constitutional: She is oriented to person, place, and time. She appears well-developed and well-nourished.  HENT:  Head: Normocephalic and atraumatic.  Right Ear: Tympanic membrane, external ear and ear canal normal.  Left Ear: Tympanic membrane, external ear and ear canal normal.  Nose: Nose normal. No rhinorrhea.  Mouth/Throat: Oropharynx is clear and moist and mucous membranes are normal. No oropharyngeal exudate or posterior oropharyngeal erythema.  Eyes: Conjunctivae and EOM are normal. Pupils are equal, round, and reactive to light.  Neck: Normal range of motion. Neck supple.  Cardiovascular:  Normal rate, regular rhythm, normal heart sounds and intact distal pulses.  Pulmonary/Chest: Effort normal and breath sounds normal.  Abdominal: Soft. Bowel sounds are normal.  Musculoskeletal:       Left knee: She exhibits decreased range of motion and swelling. She exhibits no effusion, no deformity and no erythema.       Legs: Neurological: She is alert and oriented to person, place, and time. She has normal reflexes.  Skin: Skin is warm and dry. No rash noted.  Psychiatric: She has a normal mood and affect. Her behavior is normal. Judgment and thought content normal.        Assessment & Plan:   1. Baker's cyst of knee, left - Ambulatory referral to Orthopedic Surgery  2. Bronchitis - budesonide-formoterol (SYMBICORT) 160-4.5 MCG/ACT inhaler; Inhale into the lungs.  3. Malignant neoplasm of upper-inner quadrant of left breast in female, estrogen receptor positive ([facility])  4. Moderate episode of recurrent major depressive disorder (HCC) - citalopram (CELEXA) 40 MG tablet; Take 1 tablet (40 mg total) by mouth daily.  Dispense: 30 tablet; Refill: 11  5. Chronic pain due to neoplasm - gabapentin (NEURONTIN) 300 MG capsule; Take 1-3 capsules (300-900 mg total) by mouth 3 (three) times daily.  Dispense: 90 capsule; Refill: 2    Current Outpatient Medications:  .  acetaminophen (TYLENOL) 325 MG tablet, Take 650 mg by mouth daily., Disp: , Rfl:  .  albuterol (PROAIR HFA) 108 (90 Base) MCG/ACT inhaler, Inhale 2 puffs into the lungs every 4 (four) hours as needed for wheezing or shortness of breath., Disp: 1 Inhaler, Rfl: 2 .  alendronate (FOSAMAX) 70 MG tablet, TAKE 1 TABLET BY MOUTH ONCE WEEKLY WITH A FULL GLASS OF WATER ON AN EMPTY STOMACH, Disp: 12 tablet, Rfl: 2 .  ALPRAZolam (XANAX) 0.25 MG tablet, TAKE 1 TABLET BY MOUTH THREE TIMES A DAY AS NEEDED, Disp: 90 tablet, Rfl: 1 .  anastrozole (ARIMIDEX) 1 MG tablet, Take 1 tablet (1 mg total) by mouth daily., Disp: 90 tablet, Rfl: 3 .   Ascorbic Acid (VITAMIN C) 1000 MG tablet, Take 1,000 mg by  mouth daily., Disp: , Rfl:  .  Azelastine HCl 0.15 % SOLN, One spray each nostril 1-2 times a day as needed, Disp: 30 mL, Rfl: 5 .  B Complex-C (SUPER B COMPLEX PO), Take 1 tablet by mouth daily., Disp: , Rfl:  .  Calcium Carb-Cholecalciferol (CALCIUM 600 + D PO), Take 1 tablet by mouth daily., Disp: , Rfl:  .  Carbinoxamine Maleate (RYVENT) 6 MG TABS, Take 1 tablet by mouth every 6 (six) hours as needed (or every 8 hours as needed)., Disp: 30 tablet, Rfl: 5 .  citalopram (CELEXA) 40 MG tablet, Take 1 tablet (40 mg total) by mouth daily., Disp: 30 tablet, Rfl: 11 .  esomeprazole (NEXIUM) 20 MG capsule, Take 20 mg by mouth daily. , Disp: , Rfl:  .  FERROUS SULFATE PO, Take 1 tablet by mouth daily., Disp: , Rfl:  .  FOLIC ACID PO, Take 1 tablet by mouth daily., Disp: , Rfl:  .  gabapentin (NEURONTIN) 300 MG capsule, Take 1-3 capsules (300-900 mg total) by mouth 3 (three) times daily., Disp: 90 capsule, Rfl: 2 .  hydrochlorothiazide (HYDRODIURIL) 25 MG tablet, Take 25 mg by mouth daily., Disp: , Rfl: 3 .  Lactobacillus (ACIDOPHILUS) TABS, Take by mouth., Disp: , Rfl:  .  meloxicam (MOBIC) 7.5 MG tablet, Take 1 tablet (7.5 mg total) daily by mouth., Disp: 90 tablet, Rfl: 0 .  Multiple Vitamin (MULTIVITAMIN) tablet, Take 1 tablet by mouth daily., Disp: , Rfl:  .  nystatin (MYCOSTATIN) 100000 UNIT/ML suspension, Take 5 mLs (500,000 Units total) by mouth 4 (four) times daily., Disp: 60 mL, Rfl: 0 .  olmesartan-hydrochlorothiazide (BENICAR HCT) 20-12.5 MG tablet, Take 1 tablet by mouth daily., Disp: 30 tablet, Rfl: 5 .  olopatadine (PATANOL) 0.1 % ophthalmic solution, Place 1 drop into both eyes 2 (two) times daily., Disp: 5 mL, Rfl: 12 .  Omega-3 Fatty Acids (FISH OIL) 1200 MG CAPS, Take 1,200 mg by mouth daily., Disp: , Rfl:  .  Probiotic Product (PROBIOTIC PO), Take 1 tablet by mouth daily., Disp: , Rfl:  .  ranitidine (ZANTAC) 150 MG tablet,  One tablet twice daily as needed., Disp: 60 tablet, Rfl: 5 .  traMADol (ULTRAM) 50 MG tablet, Take 1 tablet (50 mg total) every 6 (six) hours as needed by mouth., Disp: 90 tablet, Rfl: 2 .  valACYclovir (VALTREX) 1000 MG tablet, Take 1 tablet (1,000 mg total) by mouth 2 (two) times daily., Disp: 20 tablet, Rfl: 6 .  budesonide-formoterol (SYMBICORT) 160-4.5 MCG/ACT inhaler, Inhale into the lungs., Disp: , Rfl:  Continue all other maintenance medications as listed above.  Follow up plan: Return in about 4 weeks (around 01/30/2017) for recheck.  Educational handout given for [place] PA-C [facility] [address]  [place], [place] [address] [phone]   01/04/2017, 9:19 AM

## 2017-01-11 ENCOUNTER — Other Ambulatory Visit: Payer: Self-pay | Admitting: Allergy & Immunology

## 2017-01-11 ENCOUNTER — Other Ambulatory Visit: Payer: Self-pay | Admitting: Allergy and Immunology

## 2017-01-11 MED ORDER — NYSTATIN 100000 UNIT/ML MT SUSP: 5.0000 mL | Freq: Four times a day (QID) | OROMUCOSAL | 0 refills | Status: DC

## 2017-01-11 NOTE — Telephone Encounter (Signed)
Patient called and said she tried to get a prescription refilled for her thrush. She said after she had Chemo, she seems to get this every other week. The pharmacy will not fill it. [facility].

## 2017-01-11 NOTE — Telephone Encounter (Signed)
Will refill one time  

## 2017-01-14 ENCOUNTER — Telehealth: Payer: Self-pay

## 2017-01-14 NOTE — Telephone Encounter (Signed)
Pt. Stating her deductible is 600.00 before insurance will cover her symbicort and her symbicort is over 200.00. Told pt. Will leave zero pay coupon up front and a sample of symbicort for her niece or daughter to pick up.

## 2017-01-18 ENCOUNTER — Other Ambulatory Visit: Payer: Self-pay | Admitting: Physician Assistant

## 2017-01-18 ENCOUNTER — Other Ambulatory Visit: Payer: Self-pay | Admitting: *Deleted

## 2017-01-18 ENCOUNTER — Ambulatory Visit (INDEPENDENT_AMBULATORY_CARE_PROVIDER_SITE_OTHER): Payer: BLUE CROSS/BLUE SHIELD | Admitting: Orthopaedic Surgery

## 2017-01-18 ENCOUNTER — Encounter (INDEPENDENT_AMBULATORY_CARE_PROVIDER_SITE_OTHER): Payer: Self-pay | Admitting: Orthopaedic Surgery

## 2017-01-18 VITALS — BP 129/72 | HR 102 | Resp 18 | Ht 63.0 in | Wt 165.0 lb

## 2017-01-18 DIAGNOSIS — M25562 Pain in left knee: Secondary | ICD-10-CM | POA: Diagnosis not present

## 2017-01-18 DIAGNOSIS — G8929 Other chronic pain: Secondary | ICD-10-CM

## 2017-01-18 DIAGNOSIS — M25561 Pain in right knee: Secondary | ICD-10-CM

## 2017-01-18 DIAGNOSIS — G893 Neoplasm related pain (acute) (chronic): Secondary | ICD-10-CM

## 2017-01-18 MED ORDER — PENICILLIN V POTASSIUM 500 MG PO TABS: 500.0000 mg | ORAL_TABLET | Freq: Four times a day (QID) | ORAL | 1 refills | Status: DC

## 2017-01-18 MED ORDER — LIDOCAINE HCL 1 % IJ SOLN
2.0000 mL | INTRAMUSCULAR | Status: AC | PRN
Start: 2017-01-18 — End: 2017-01-18
  Administered 2017-01-18: 2 mL

## 2017-01-18 MED ORDER — GABAPENTIN 300 MG PO CAPS: 300.0000 mg | ORAL_CAPSULE | Freq: Three times a day (TID) | ORAL | 0 refills | Status: DC

## 2017-01-18 MED ORDER — METHYLPREDNISOLONE ACETATE 40 MG/ML IJ SUSP
80.0000 mg | INTRAMUSCULAR | Status: AC | PRN
  Administered 2017-01-18: 80 mg

## 2017-01-18 MED ORDER — BUPIVACAINE HCL 0.5 % IJ SOLN
2.0000 mL | INTRAMUSCULAR | Status: AC | PRN
  Administered 2017-01-18: 2 mL via INTRA_ARTICULAR

## 2017-01-18 NOTE — Progress Notes (Signed)
Office Visit Note   Patient: Heidi Walls           Date of Birth: 10/15/1955           MRN: [id number] Visit Date: 01/18/2017              Requested by: Terald Sleeper, PA-C [address] [place], [place] [address] PCP: Terald Sleeper, PA-C   Assessment & Plan: Visit Diagnoses:  1. Chronic pain of both knees     Plan: Mild-to-moderate osteoarthritis left knee and will try cortisone injection and monitor her response over the next 2 weeks  Follow-Up Instructions: Return in about 2 weeks (around 02/01/2017).   Orders:  Orders Placed This Encounter  Procedures  . Large Joint Inj: L knee   No orders of the defined types were placed in this encounter.     Procedures: Large Joint Inj: L knee on 01/18/2017 9:30 AM Indications: pain and diagnostic evaluation Details: 25 G 1.5 in needle, anteromedial approach  Arthrogram: No  Medications: 2 mL lidocaine 1 %; 2 mL bupivacaine 0.5 %; 80 mg methylPREDNISolone acetate 40 MG/ML Procedure, treatment alternatives, risks and benefits explained, specific risks discussed. Consent was given by the patient. Patient was prepped and draped in the usual sterile fashion.       Clinical Data: No additional findings.   Subjective: Chief Complaint  Patient presents with  . Left Knee - Pain  . Right Knee - Pain  . Knee Pain    Bil knee pain, right knee surgery arthroscopic x 21 years, baker's cyst x bil, weakness, pain, swelling, difficulty walking, popping, clicking, grinding noises, Tylenol helps some, no injury, not diabetic, no surgery to left knee  Having bilateral knee pain worse on the left than the right. Prior history of right knee arthroscopy with a "Bakers cyst" over 40 years ago while living in [place]. Recent insidious onset of left knee pain predominantly along the medial compartment without history of injury or trauma. Chart reviewed. History of breast cancer and peripheral neuropathy related to her chemotherapy. Has tried  over-the-counter medicines for her knee without much relief. Films of left knee on the disc were evaluated. There was mild decrease in the medial joint space with some mild subchondral sclerosis in the mediofemoral condyle tibial plateau. Minimal osteophyte formation.  HPI  Review of Systems  Constitutional: Positive for activity change.  HENT: Positive for trouble swallowing.   Eyes: Negative for pain.  Respiratory: Positive for cough, shortness of breath and wheezing.   Cardiovascular: Positive for leg swelling.  Gastrointestinal: Negative for constipation.  Endocrine: Positive for heat intolerance.  Genitourinary: Negative for difficulty urinating.  Musculoskeletal: Positive for back pain, gait problem and joint swelling.  Skin: Negative for rash.  Allergic/Immunologic: Negative for food allergies.  Neurological: Positive for weakness.  Hematological: Bruises/bleeds easily.  Psychiatric/Behavioral: Negative for sleep disturbance.     Objective: Vital Signs: BP 129/72 (BP Location: Right Arm, Patient Position: Sitting, Cuff Size: Normal)   Pulse (!) 102   Resp 18   Ht 5\' 3"  (1.6 m)   Wt 165 lb (74.8 kg)   BMI 29.23 kg/m   Physical Exam  Ortho Exam awake alert and oriented 3. Comfortable sitting. No limp. Exam left knee was positive for medial joint pain. Little bit of patellar crepitation. No effusion. No lateral joint pain. No calf pain. Some mild fullness in the popliteal space possibly consistent with a popliteal cyst but no pain. Some altered sensibility distally  related to her neuropathy. Straight leg raise negative. Painless range of motion of hip.  Specialty Comments:  No specialty comments available.  Imaging: No results found.   PMFS History: Patient Active Problem List   Diagnosis Date Noted  . Baker's cyst of knee, left 01/02/2017  . Perennial allergic rhinitis with a probable nonallergic component 12/12/2016  . Neuropathy 11/19/2016  . Chronic pain  11/19/2016  . Moderate persistent asthma 07/10/2016  . Bronchitis 07/10/2016  . Chemotherapy induced diarrhea 10/17/2015  . Osteoporosis 09/07/2015  . Generalized anxiety disorder 09/07/2015  . Osteoarthritis of right knee 09/07/2015  . GERD (gastroesophageal reflux disease) 09/07/2015  . Depression 09/07/2015  . Irritability 09/07/2015  . Acquired absence of left breast and nipple 06/30/2015  . Breast cancer of upper-inner quadrant of left female breast ([facility]) 05/26/2015   Past Medical History:  Diagnosis Date  . Anemia   . Anxiety    OCD  . Arthritis    knees and hips  . Asthma   . Breast cancer ([facility])   . Breast cancer of upper-inner quadrant of left female breast ([facility]) 05/26/2015  . GERD (gastroesophageal reflux disease)   . Hypertension   . Insomnia     Family History  Problem Relation Age of Onset  . Heart failure Mother   . Diabetes Mother   . Hypertension Mother   . Hypertension Father   . Breast cancer Maternal Grandmother   . Allergic rhinitis Sister   . Sinusitis Sister   . Allergic rhinitis Daughter   . Allergic rhinitis Son   . Sinusitis Son   . Bronchitis Grandchild   . Asthma Neg Hx   . Eczema Neg Hx   . Urticaria Neg Hx   . Immunodeficiency Neg Hx   . Angioedema Neg Hx     Past Surgical History:  Procedure Laterality Date  . BREAST RECONSTRUCTION WITH PLACEMENT OF TISSUE EXPANDER AND FLEX HD (ACELLULAR HYDRATED DERMIS) Left 06/24/2015   Procedure: LEFT BREAST RECONSTRUCTION WITH PLACEMENT OF TISSUE EXPANDER AND  ACELLULAR HYDRATED DERMIS (CORTIVA);  Surgeon: Irene Limbo, MD;  Location: [facility];  Service: Plastics;  Laterality: Left;  . KNEE SURGERY Right 2000   arthroscopy  . LIPOSUCTION WITH LIPOFILLING Left 12/23/2015   Procedure: LIPOFILLING TO LEFT CHEST FROM ABDOMEN  ;  Surgeon: Irene Limbo, MD;  Location: [facility];  Service: Plastics;  Laterality: Left;  Marland Kitchen MASTECTOMY W/ SENTINEL NODE BIOPSY Left  06/24/2015   Procedure: LEFT TOTAL MASTECTOMY WITH SENTINEL LYMPH NODE BIOPSY;  Surgeon: Excell Seltzer, MD;  Location: [facility];  Service: General;  Laterality: Left;  Marland Kitchen MASTOPEXY Right 12/23/2015   Procedure: RIGHT BREAST MASTOPEXY;  Surgeon: Irene Limbo, MD;  Location: [facility];  Service: Plastics;  Laterality: Right;  . MICROLARYNGOSCOPY W/VOCAL CORD INJECTION N/A 10/15/2016   Procedure: SUSPENDED DIRECT MICROLARYNGOSCOPY WITH VOCAL CORD INJECTION WITH JET VENTILATION;  Surgeon: Melida Quitter, MD;  Location: [facility];  Service: ENT;  Laterality: N/A;  . PORTACATH PLACEMENT Right 06/24/2015   Procedure: INSERTION PORT-A-CATH;  Surgeon: Excell Seltzer, MD;  Location: [facility];  Service: General;  Laterality: Right;  . portacath removal  09/05/2016  . REMOVAL OF BILATERAL TISSUE EXPANDERS WITH PLACEMENT OF BILATERAL BREAST IMPLANTS Left 12/23/2015   Procedure: REMOVAL OF LEFT  TISSUE EXPANDERS WITH PLACEMENT OF LEFT  BREAST IMPLANTS;  Surgeon: Irene Limbo, MD;  Location: [facility];  Service: Plastics;  Laterality: Left;  . TONSILLECTOMY  Social History   Occupational History  . Not on file  Tobacco Use  . Smoking status: Former Smoker    Packs/day: 1.00    Years: 25.00    Pack years: 25.00    Types: Cigarettes    Last attempt to quit: 10/09/2005    Years since quitting: 11.2  . Smokeless tobacco: Never Used  Substance and Sexual Activity  . Alcohol use: No    Frequency: Never  . Drug use: No  . Sexual activity: Not on file

## 2017-01-20 ENCOUNTER — Other Ambulatory Visit: Payer: Self-pay | Admitting: Physician Assistant

## 2017-01-20 DIAGNOSIS — F411 Generalized anxiety disorder: Secondary | ICD-10-CM

## 2017-01-21 ENCOUNTER — Telehealth: Payer: Self-pay | Admitting: Physician Assistant

## 2017-01-21 MED ORDER — NYSTATIN 100000 UNIT/ML MT SUSP: 5.0000 mL | Freq: Four times a day (QID) | OROMUCOSAL | 0 refills | Status: DC

## 2017-01-21 NOTE — Telephone Encounter (Signed)
Last seen 01/02/17  [facility]

## 2017-01-22 NOTE — Telephone Encounter (Signed)
Patient is aware that Nystatin suspension has been sent in

## 2017-01-29 ENCOUNTER — Encounter: Payer: Self-pay | Admitting: Physician Assistant

## 2017-01-29 ENCOUNTER — Ambulatory Visit: Payer: BLUE CROSS/BLUE SHIELD | Admitting: Physician Assistant

## 2017-01-29 VITALS — BP 127/71 | HR 85 | Temp 98.1°F | Ht 63.0 in | Wt 170.2 lb

## 2017-01-29 DIAGNOSIS — M1711 Unilateral primary osteoarthritis, right knee: Secondary | ICD-10-CM

## 2017-01-29 DIAGNOSIS — Z170 Estrogen receptor positive status [ER+]: Secondary | ICD-10-CM

## 2017-01-29 DIAGNOSIS — C50212 Malignant neoplasm of upper-inner quadrant of left female breast: Secondary | ICD-10-CM

## 2017-01-29 DIAGNOSIS — F331 Major depressive disorder, recurrent, moderate: Secondary | ICD-10-CM

## 2017-01-29 DIAGNOSIS — F411 Generalized anxiety disorder: Secondary | ICD-10-CM | POA: Diagnosis not present

## 2017-01-29 MED ORDER — DICLOFENAC SODIUM 75 MG PO TBEC: 75.0000 mg | DELAYED_RELEASE_TABLET | Freq: Two times a day (BID) | ORAL | 5 refills | Status: DC

## 2017-01-29 NOTE — Progress Notes (Signed)
BP 127/71   Pulse 85   Temp 98.1 F (36.7 C) (Oral)   Ht 5\' 3"  (1.6 m)   Wt 170 lb 3.2 oz (77.2 kg)   BMI 30.15 kg/m    Subjective:    Patient ID: Heidi Walls, female    DOB: 1955-01-30, 62 y.o.   MRN: [id number]  HPI: Heidi Walls is a 62 y.o. female presenting on 01/29/2017 for Follow-up (4 week )  This patient comes in for periodic recheck on medications and conditions including depression, hypertension, OA. She is feeling better. She does need referral to ortho again.   She still has a lot of stressors, irritability and anxiety..   All medications are reviewed today. There are no reports of any problems with the medications. All of the medical conditions are reviewed and updated.  Lab work is reviewed and will be ordered as medically necessary. There are no new problems reported with today's visit.   Relevant past medical, surgical, family and social history reviewed and updated as indicated. Allergies and medications reviewed and updated.  Past Medical History:  Diagnosis Date  . Anemia   . Anxiety    OCD  . Arthritis    knees and hips  . Asthma   . Breast cancer ([facility])   . Breast cancer of upper-inner quadrant of left female breast ([place]) 05/26/2015  . GERD (gastroesophageal reflux disease)   . Hypertension   . Insomnia     Past Surgical History:  Procedure Laterality Date  . BREAST RECONSTRUCTION WITH PLACEMENT OF TISSUE EXPANDER AND FLEX HD (ACELLULAR HYDRATED DERMIS) Left 06/24/2015   Procedure: LEFT BREAST RECONSTRUCTION WITH PLACEMENT OF TISSUE EXPANDER AND  ACELLULAR HYDRATED DERMIS (CORTIVA);  Surgeon: Irene Limbo, MD;  Location: [facility];  Service: Plastics;  Laterality: Left;  . KNEE SURGERY Right 2000   arthroscopy  . LIPOSUCTION WITH LIPOFILLING Left 12/23/2015   Procedure: LIPOFILLING TO LEFT CHEST FROM ABDOMEN  ;  Surgeon: Irene Limbo, MD;  Location: [facility];  Service: Plastics;  Laterality: Left;  Marland Kitchen  MASTECTOMY W/ SENTINEL NODE BIOPSY Left 06/24/2015   Procedure: LEFT TOTAL MASTECTOMY WITH SENTINEL LYMPH NODE BIOPSY;  Surgeon: Excell Seltzer, MD;  Location: [facility];  Service: General;  Laterality: Left;  Marland Kitchen MASTOPEXY Right 12/23/2015   Procedure: RIGHT BREAST MASTOPEXY;  Surgeon: Irene Limbo, MD;  Location: [facility];  Service: Plastics;  Laterality: Right;  . MICROLARYNGOSCOPY W/VOCAL CORD INJECTION N/A 10/15/2016   Procedure: SUSPENDED DIRECT MICROLARYNGOSCOPY WITH VOCAL CORD INJECTION WITH JET VENTILATION;  Surgeon: Melida Quitter, MD;  Location: [facility];  Service: ENT;  Laterality: N/A;  . PORTACATH PLACEMENT Right 06/24/2015   Procedure: INSERTION PORT-A-CATH;  Surgeon: Excell Seltzer, MD;  Location: [facility];  Service: General;  Laterality: Right;  . portacath removal  09/05/2016  . REMOVAL OF BILATERAL TISSUE EXPANDERS WITH PLACEMENT OF BILATERAL BREAST IMPLANTS Left 12/23/2015   Procedure: REMOVAL OF LEFT  TISSUE EXPANDERS WITH PLACEMENT OF LEFT  BREAST IMPLANTS;  Surgeon: Irene Limbo, MD;  Location: [facility];  Service: Plastics;  Laterality: Left;  . TONSILLECTOMY      Review of Systems  Constitutional: Negative.  Negative for activity change, fatigue and fever.  HENT: Negative.   Eyes: Negative.   Respiratory: Negative.  Negative for cough.   Cardiovascular: Negative.  Negative for chest pain.  Gastrointestinal: Negative.  Negative for abdominal pain.  Endocrine: Negative.   Genitourinary: Negative.  Negative for dysuria.  Musculoskeletal: Negative.   Skin: Negative.   Neurological: Negative.     Allergies as of 01/29/2017      Reactions   Codeine Shortness Of Breath   Pt reports this was 30 years ago. Pt reports this was 30 years ago.   Lisinopril Cough      Medication List        Accurate as of 01/29/17  9:44 PM. Always use your most recent med list.          acetaminophen 325 MG  tablet Commonly known as:  TYLENOL Take 650 mg by mouth daily.   Acidophilus Tabs Take by mouth.   albuterol 108 (90 Base) MCG/ACT inhaler Commonly known as:  PROAIR HFA Inhale 2 puffs into the lungs every 4 (four) hours as needed for wheezing or shortness of breath.   alendronate 70 MG tablet Commonly known as:  FOSAMAX TAKE 1 TABLET BY MOUTH ONCE WEEKLY WITH A FULL GLASS OF WATER ON AN EMPTY STOMACH   ALPRAZolam 0.25 MG tablet Commonly known as:  XANAX TAKE 1 TABLET BY MOUTH THREE TIMES A DAY AS NEEDED   anastrozole 1 MG tablet Commonly known as:  ARIMIDEX Take 1 tablet (1 mg total) by mouth daily.   Azelastine HCl 0.15 % Soln One spray each nostril 1-2 times a day as needed   CALCIUM 600 + D PO Take 1 tablet by mouth daily.   Carbinoxamine Maleate 6 MG Tabs Commonly known as:  RYVENT Take 1 tablet by mouth every 6 (six) hours as needed (or every 8 hours as needed).   citalopram 40 MG tablet Commonly known as:  CELEXA Take 1 tablet (40 mg total) by mouth daily.   diclofenac 75 MG EC tablet Commonly known as:  VOLTAREN Take 1 tablet (75 mg total) by mouth 2 (two) times daily.   esomeprazole 20 MG capsule Commonly known as:  NEXIUM Take 20 mg by mouth daily.   FERROUS SULFATE PO Take 1 tablet by mouth daily.   Fish Oil 1200 MG Caps Take 1,200 mg by mouth daily.   FOLIC ACID PO Take 1 tablet by mouth daily.   gabapentin 300 MG capsule Commonly known as:  NEURONTIN Take 1-3 capsules (300-900 mg total) by mouth 3 (three) times daily.   hydrochlorothiazide 25 MG tablet Commonly known as:  HYDRODIURIL Take 25 mg by mouth daily.   multivitamin tablet Take 1 tablet by mouth daily.   nystatin 100000 UNIT/ML suspension Commonly known as:  MYCOSTATIN Take 5 mLs (500,000 Units total) by mouth 4 (four) times daily.   olmesartan-hydrochlorothiazide 20-12.5 MG tablet Commonly known as:  BENICAR HCT Take 1 tablet by mouth daily.   olopatadine 0.1 % ophthalmic  solution Commonly known as:  PATANOL Place 1 drop into both eyes 2 (two) times daily.   PROBIOTIC PO Take 1 tablet by mouth daily.   ranitidine 150 MG tablet Commonly known as:  ZANTAC One tablet twice daily as needed.   SUPER B COMPLEX PO Take 1 tablet by mouth daily.   SYMBICORT 160-4.5 MCG/ACT inhaler Generic drug:  budesonide-formoterol Inhale into the lungs.   traMADol 50 MG tablet Commonly known as:  ULTRAM Take 1 tablet (50 mg total) every 6 (six) hours as needed by mouth.   valACYclovir 1000 MG tablet Commonly known as:  VALTREX Take 1 tablet (1,000 mg total) by mouth 2 (two) times daily.   vitamin C 1000 MG tablet Take 1,000 mg by mouth daily.  Objective:    BP 127/71   Pulse 85   Temp 98.1 F (36.7 C) (Oral)   Ht 5\' 3"  (1.6 m)   Wt 170 lb 3.2 oz (77.2 kg)   BMI 30.15 kg/m   Allergies  Allergen Reactions  . Codeine Shortness Of Breath    Pt reports this was 30 years ago. Pt reports this was 30 years ago.  Marland Kitchen Lisinopril Cough    Physical Exam  Constitutional: She is oriented to person, place, and time. She appears well-developed and well-nourished.  HENT:  Head: Normocephalic and atraumatic.  Right Ear: Tympanic membrane, external ear and ear canal normal.  Left Ear: Tympanic membrane, external ear and ear canal normal.  Nose: Nose normal. No rhinorrhea.  Mouth/Throat: Oropharynx is clear and moist and mucous membranes are normal. No oropharyngeal exudate or posterior oropharyngeal erythema.  Eyes: Conjunctivae and EOM are normal. Pupils are equal, round, and reactive to light.  Neck: Normal range of motion. Neck supple.  Cardiovascular: Normal rate, regular rhythm, normal heart sounds and intact distal pulses.  Pulmonary/Chest: Effort normal and breath sounds normal.  Abdominal: Soft. Bowel sounds are normal.  Neurological: She is alert and oriented to person, place, and time. She has normal reflexes.  Skin: Skin is warm and dry. No  rash noted.  Psychiatric: She has a normal mood and affect. Her behavior is normal. Judgment and thought content normal.    Results for orders placed or performed during the hospital encounter of 10/09/16  CBC  Result Value Ref Range   WBC 8.6 4.0 - 10.5 K/uL   RBC 3.48 (L) 3.87 - 5.11 MIL/uL   Hemoglobin 9.7 (L) 12.0 - 15.0 g/dL   HCT 30.7 (L) 36.0 - 46.0 %   MCV 88.2 78.0 - 100.0 fL   MCH 27.9 26.0 - 34.0 pg   MCHC 31.6 30.0 - 36.0 g/dL   RDW 14.1 11.5 - 15.5 %   Platelets 269 150 - 400 K/uL  Basic metabolic panel  Result Value Ref Range   Sodium 135 135 - 145 mmol/L   Potassium 3.9 3.5 - 5.1 mmol/L   Chloride 100 (L) 101 - 111 mmol/L   CO2 27 22 - 32 mmol/L   Glucose, Bld 107 (H) 65 - 99 mg/dL   BUN 19 6 - 20 mg/dL   Creatinine, Ser 0.91 0.44 - 1.00 mg/dL   Calcium 9.0 8.9 - 10.3 mg/dL   GFR calc non Af Amer >60 >60 mL/min   GFR calc Af Amer >60 >60 mL/min   Anion gap 8 5 - 15      Assessment & Plan:   1. Arthritis of right knee - diclofenac (VOLTAREN) 75 MG EC tablet; Take 1 tablet (75 mg total) by mouth 2 (two) times daily.  Dispense: 60 tablet; Refill: 5 - Ambulatory referral to Orthopedic Surgery  2. Malignant neoplasm of upper-inner quadrant of left breast in female, estrogen receptor positive ([place])  3. Generalized anxiety disorder  4. Moderate episode of recurrent major depressive disorder (HCC)     Current Outpatient Medications:  .  acetaminophen (TYLENOL) 325 MG tablet, Take 650 mg by mouth daily., Disp: , Rfl:  .  albuterol (PROAIR HFA) 108 (90 Base) MCG/ACT inhaler, Inhale 2 puffs into the lungs every 4 (four) hours as needed for wheezing or shortness of breath., Disp: 1 Inhaler, Rfl: 2 .  alendronate (FOSAMAX) 70 MG tablet, TAKE 1 TABLET BY MOUTH ONCE WEEKLY WITH A FULL GLASS OF WATER ON AN  EMPTY STOMACH, Disp: 12 tablet, Rfl: 2 .  ALPRAZolam (XANAX) 0.25 MG tablet, TAKE 1 TABLET BY MOUTH THREE TIMES A DAY AS NEEDED, Disp: 90 tablet, Rfl: 1 .   anastrozole (ARIMIDEX) 1 MG tablet, Take 1 tablet (1 mg total) by mouth daily., Disp: 90 tablet, Rfl: 3 .  Ascorbic Acid (VITAMIN C) 1000 MG tablet, Take 1,000 mg by mouth daily., Disp: , Rfl:  .  Azelastine HCl 0.15 % SOLN, One spray each nostril 1-2 times a day as needed, Disp: 30 mL, Rfl: 5 .  B Complex-C (SUPER B COMPLEX PO), Take 1 tablet by mouth daily., Disp: , Rfl:  .  budesonide-formoterol (SYMBICORT) 160-4.5 MCG/ACT inhaler, Inhale into the lungs., Disp: , Rfl:  .  Calcium Carb-Cholecalciferol (CALCIUM 600 + D PO), Take 1 tablet by mouth daily., Disp: , Rfl:  .  Carbinoxamine Maleate (RYVENT) 6 MG TABS, Take 1 tablet by mouth every 6 (six) hours as needed (or every 8 hours as needed)., Disp: 30 tablet, Rfl: 5 .  citalopram (CELEXA) 40 MG tablet, Take 1 tablet (40 mg total) by mouth daily., Disp: 90 tablet, Rfl: 3 .  diclofenac (VOLTAREN) 75 MG EC tablet, Take 1 tablet (75 mg total) by mouth 2 (two) times daily., Disp: 60 tablet, Rfl: 5 .  esomeprazole (NEXIUM) 20 MG capsule, Take 20 mg by mouth daily. , Disp: , Rfl:  .  FERROUS SULFATE PO, Take 1 tablet by mouth daily., Disp: , Rfl:  .  FOLIC ACID PO, Take 1 tablet by mouth daily., Disp: , Rfl:  .  gabapentin (NEURONTIN) 300 MG capsule, Take 1-3 capsules (300-900 mg total) by mouth 3 (three) times daily., Disp: 270 capsule, Rfl: 0 .  hydrochlorothiazide (HYDRODIURIL) 25 MG tablet, Take 25 mg by mouth daily., Disp: , Rfl: 3 .  Lactobacillus (ACIDOPHILUS) TABS, Take by mouth., Disp: , Rfl:  .  Multiple Vitamin (MULTIVITAMIN) tablet, Take 1 tablet by mouth daily., Disp: , Rfl:  .  nystatin (MYCOSTATIN) 100000 UNIT/ML suspension, Take 5 mLs (500,000 Units total) by mouth 4 (four) times daily., Disp: 60 mL, Rfl: 0 .  olmesartan-hydrochlorothiazide (BENICAR HCT) 20-12.5 MG tablet, Take 1 tablet by mouth daily., Disp: 30 tablet, Rfl: 5 .  olopatadine (PATANOL) 0.1 % ophthalmic solution, Place 1 drop into both eyes 2 (two) times daily., Disp: 5  mL, Rfl: 12 .  Omega-3 Fatty Acids (FISH OIL) 1200 MG CAPS, Take 1,200 mg by mouth daily., Disp: , Rfl:  .  Probiotic Product (PROBIOTIC PO), Take 1 tablet by mouth daily., Disp: , Rfl:  .  ranitidine (ZANTAC) 150 MG tablet, One tablet twice daily as needed., Disp: 60 tablet, Rfl: 5 .  traMADol (ULTRAM) 50 MG tablet, Take 1 tablet (50 mg total) every 6 (six) hours as needed by mouth., Disp: 90 tablet, Rfl: 2 .  valACYclovir (VALTREX) 1000 MG tablet, Take 1 tablet (1,000 mg total) by mouth 2 (two) times daily., Disp: 20 tablet, Rfl: 6 Continue all other maintenance medications as listed above.  Follow up plan: Return in about 3 months (around 04/29/2017).  Educational handout given for [place] PA-C [facility] [address]  [place], [place] [address] [phone]   01/29/2017, 9:44 PM

## 2017-01-31 ENCOUNTER — Ambulatory Visit (INDEPENDENT_AMBULATORY_CARE_PROVIDER_SITE_OTHER): Payer: BLUE CROSS/BLUE SHIELD | Admitting: Orthopaedic Surgery

## 2017-02-11 ENCOUNTER — Encounter: Payer: Self-pay | Admitting: *Deleted

## 2017-02-11 ENCOUNTER — Inpatient Hospital Stay: Payer: BLUE CROSS/BLUE SHIELD | Attending: Hematology and Oncology | Admitting: Hematology and Oncology

## 2017-02-11 VITALS — BP 162/73 | HR 85 | Temp 97.9°F | Resp 18 | Ht 63.0 in | Wt 167.2 lb

## 2017-02-11 DIAGNOSIS — H539 Unspecified visual disturbance: Secondary | ICD-10-CM

## 2017-02-11 DIAGNOSIS — M2550 Pain in unspecified joint: Secondary | ICD-10-CM | POA: Diagnosis not present

## 2017-02-11 DIAGNOSIS — Z79811 Long term (current) use of aromatase inhibitors: Secondary | ICD-10-CM | POA: Diagnosis not present

## 2017-02-11 DIAGNOSIS — M791 Myalgia, unspecified site: Secondary | ICD-10-CM

## 2017-02-11 DIAGNOSIS — Z170 Estrogen receptor positive status [ER+]: Secondary | ICD-10-CM | POA: Diagnosis not present

## 2017-02-11 DIAGNOSIS — R413 Other amnesia: Secondary | ICD-10-CM | POA: Insufficient documentation

## 2017-02-11 DIAGNOSIS — D649 Anemia, unspecified: Secondary | ICD-10-CM

## 2017-02-11 DIAGNOSIS — C50212 Malignant neoplasm of upper-inner quadrant of left female breast: Secondary | ICD-10-CM | POA: Insufficient documentation

## 2017-02-11 DIAGNOSIS — M8580 Other specified disorders of bone density and structure, unspecified site: Secondary | ICD-10-CM

## 2017-02-11 MED ORDER — LETROZOLE 2.5 MG PO TABS: 2.5000 mg | ORAL_TABLET | Freq: Every day | ORAL | 3 refills | Status: DC

## 2017-02-11 NOTE — Progress Notes (Signed)
Patient Care Team: Theodoro Clock as PCP - General (General Practice) Nicholas Lose, MD as Consulting Physician (Hematology and Oncology) Delice Bison, Charlestine Massed, NP as Nurse Practitioner (Hematology and Oncology) Irene Limbo, MD as Consulting Physician (Plastic Surgery) Excell Seltzer, MD as Consulting Physician (General Surgery)  DIAGNOSIS:  Encounter Diagnosis  Name Primary?  . Malignant neoplasm of upper-inner quadrant of left breast in female, estrogen receptor positive ([facility])     SUMMARY OF ONCOLOGIC HISTORY:   Breast cancer of upper-inner quadrant of left female breast ([facility])   05/26/2015 Initial Diagnosis    Screen detected left breast asymmetry (posterior 1.6 cm): Grade 2-3 IDC ER/PR positive HER-2 positive Ki-67 20% plus calcs (span 6.1 cm): High-grade DCIS with suspicious foci of invasion (4.2 cm apart)      06/24/2015 Surgery    Left simple mastectomy with reconstruction: IDC grade 3, 2.5 cm, with high-grade DCIS, ALH, LVI present, margins negative, 0/1 sentinel node negative, T2 N0 stage II a, ER 100%, PR 5%, HER-2 positive ratio 1.56 with average copy #8.25, Ki-67 20%      07/25/2015 - 11/03/2015 Chemotherapy    Adjuvant chemotherapy with TCH [place] 6 cycles followed by Herceptin maintenance for 1 year      11/28/2015 -  Anti-estrogen oral therapy    Adjuvant anastrozole 1 mg daily       CHIEF COMPLIANT: Multiple symptoms and concerns, severe memory loss, and stiffness and achiness, vision impairment,  INTERVAL HISTORY: Heidi Walls is a 62 year old with above-mentioned history of left breast cancer who underwent mastectomy followed by adjuvant chemotherapy and is currently on anastrozole therapy.  She is having lots of different problems including pain in her hands related to stiffness of anastrozole origin.  She also has profound memory problems and cannot seem to remember simple names and numbers etc.  She is also complaining of loss of vision.   She had an episode of syncope in 2017.  She is diffusely achy and has a lot of discomfort.  REVIEW OF SYSTEMS:   Constitutional: Denies fevers, chills or abnormal weight loss Eyes: Denies blurriness of vision Ears, nose, mouth, throat, and face: Denies mucositis or sore throat Respiratory: Denies cough, dyspnea or wheezes Cardiovascular: Denies palpitation, chest discomfort Gastrointestinal:  Denies nausea, heartburn or change in bowel habits Skin: Denies abnormal skin rashes Lymphatics: Denies new lymphadenopathy or easy bruising Neurological:Denies numbness, tingling or new weaknesses Behavioral/Psych: Mood is stable, no new changes  Extremities: No lower extremity edema All other systems were reviewed with the patient and are negative.  I have reviewed the past medical history, past surgical history, social history and family history with the patient and they are unchanged from previous note.  ALLERGIES:  is allergic to codeine and lisinopril.  MEDICATIONS:  Current Outpatient Medications  Medication Sig Dispense Refill  . acetaminophen (TYLENOL) 325 MG tablet Take 650 mg by mouth daily.    Marland Kitchen albuterol (PROAIR HFA) 108 (90 Base) MCG/ACT inhaler Inhale 2 puffs into the lungs every 4 (four) hours as needed for wheezing or shortness of breath. 1 Inhaler 2  . alendronate (FOSAMAX) 70 MG tablet TAKE 1 TABLET BY MOUTH ONCE WEEKLY WITH A FULL GLASS OF WATER ON AN EMPTY STOMACH 12 tablet 2  . ALPRAZolam (XANAX) 0.25 MG tablet TAKE 1 TABLET BY MOUTH THREE TIMES A DAY AS NEEDED 90 tablet 1  . anastrozole (ARIMIDEX) 1 MG tablet Take 1 tablet (1 mg total) by mouth daily. 90 tablet 3  . Ascorbic  Acid (VITAMIN C) 1000 MG tablet Take 1,000 mg by mouth daily.    . Azelastine HCl 0.15 % SOLN One spray each nostril 1-2 times a day as needed 30 mL 5  . B Complex-C (SUPER B COMPLEX PO) Take 1 tablet by mouth daily.    . budesonide-formoterol (SYMBICORT) 160-4.5 MCG/ACT inhaler Inhale into the lungs.     . Calcium Carb-Cholecalciferol (CALCIUM 600 + D PO) Take 1 tablet by mouth daily.    . Carbinoxamine Maleate (RYVENT) 6 MG TABS Take 1 tablet by mouth every 6 (six) hours as needed (or every 8 hours as needed). 30 tablet 5  . citalopram (CELEXA) 40 MG tablet Take 1 tablet (40 mg total) by mouth daily. 90 tablet 3  . diclofenac (VOLTAREN) 75 MG EC tablet Take 1 tablet (75 mg total) by mouth 2 (two) times daily. 60 tablet 5  . esomeprazole (NEXIUM) 20 MG capsule Take 20 mg by mouth daily.     Marland Kitchen FERROUS SULFATE PO Take 1 tablet by mouth daily.    Marland Kitchen FOLIC ACID PO Take 1 tablet by mouth daily.    Marland Kitchen gabapentin (NEURONTIN) 300 MG capsule Take 1-3 capsules (300-900 mg total) by mouth 3 (three) times daily. 270 capsule 0  . hydrochlorothiazide (HYDRODIURIL) 25 MG tablet Take 25 mg by mouth daily.  3  . Lactobacillus (ACIDOPHILUS) TABS Take by mouth.    . Multiple Vitamin (MULTIVITAMIN) tablet Take 1 tablet by mouth daily.    Marland Kitchen nystatin (MYCOSTATIN) 100000 UNIT/ML suspension Take 5 mLs (500,000 Units total) by mouth 4 (four) times daily. 60 mL 0  . olmesartan-hydrochlorothiazide (BENICAR HCT) 20-12.5 MG tablet Take 1 tablet by mouth daily. 30 tablet 5  . olopatadine (PATANOL) 0.1 % ophthalmic solution Place 1 drop into both eyes 2 (two) times daily. 5 mL 12  . Omega-3 Fatty Acids (FISH OIL) 1200 MG CAPS Take 1,200 mg by mouth daily.    . Probiotic Product (PROBIOTIC PO) Take 1 tablet by mouth daily.    . ranitidine (ZANTAC) 150 MG tablet One tablet twice daily as needed. 60 tablet 5  . traMADol (ULTRAM) 50 MG tablet Take 1 tablet (50 mg total) every 6 (six) hours as needed by mouth. 90 tablet 2  . valACYclovir (VALTREX) 1000 MG tablet Take 1 tablet (1,000 mg total) by mouth 2 (two) times daily. 20 tablet 6   No current facility-administered medications for this visit.     PHYSICAL EXAMINATION: ECOG PERFORMANCE STATUS: 1 - Symptomatic but completely ambulatory  Vitals:   02/11/17 1032  BP: (!)  162/73  Pulse: 85  Resp: 18  Temp: 97.9 F (36.6 C)  SpO2: 100%   Filed Weights   02/11/17 1032  Weight: 167 lb 3.2 oz (75.8 kg)    GENERAL:alert, no distress and comfortable SKIN: skin color, texture, turgor are normal, no rashes or significant lesions EYES: normal, Conjunctiva are pink and non-injected, sclera clear OROPHARYNX:no exudate, no erythema and lips, buccal mucosa, and tongue normal  NECK: supple, thyroid normal size, non-tender, without nodularity LYMPH:  no palpable lymphadenopathy in the cervical, axillary or inguinal LUNGS: clear to auscultation and percussion with normal breathing effort HEART: regular rate & rhythm and no murmurs and no lower extremity edema ABDOMEN:abdomen soft, non-tender and normal bowel sounds MUSCULOSKELETAL:no cyanosis of digits and no clubbing  NEURO: alert & oriented x 3 with fluent speech, no focal motor/sensory deficits EXTREMITIES: No lower extremity edema  LABORATORY DATA:  I have reviewed the data as  listed CMP Latest Ref Rng & Units 10/09/2016 08/07/2016 07/17/2016  Glucose 65 - 99 mg/dL 107(H) 121 100  BUN 6 - 20 mg/dL 19 19.6 22.6  Creatinine 0.44 - 1.00 mg/dL 0.91 1.1 1.1  Sodium 135 - 145 mmol/L 135 137 135(L)  Potassium 3.5 - 5.1 mmol/L 3.9 4.4 4.3  Chloride 101 - 111 mmol/L 100(L) - -  CO2 22 - 32 mmol/L '27 27 26  '$ Calcium 8.9 - 10.3 mg/dL 9.0 9.8 10.1  Total Protein 6.4 - 8.3 g/dL - 7.5 7.7  Total Bilirubin 0.20 - 1.20 mg/dL - 0.22 0.28  Alkaline Phos 40 - 150 U/L - 83 83  AST 5 - 34 U/L - 26 21  ALT 0 - 55 U/L - 30 24    Lab Results  Component Value Date   WBC 8.6 10/09/2016   HGB 9.7 (L) 10/09/2016   HCT 30.7 (L) 10/09/2016   MCV 88.2 10/09/2016   PLT 269 10/09/2016   NEUTROABS 4.8 08/07/2016    ASSESSMENT & PLAN:  Breast cancer of upper-inner quadrant of left female breast ([facility]) Left simple mastectomy with reconstruction 06/24/2015: IDC grade 3, 2.5 cm, with high-grade DCIS, ALH, LVI present, margins  negative, 0/1 sentinel node negative, , ER 100%, PR 5%, HER-2 positive ratio 1.56 with average copy #8.25, Ki-67 20%  Pathologic stage:T2 N0 stage II a Treatment plan: 1. Adjuvant chemotherapy with TCHP X 6 completed 10/30/17followed by Herceptin maintenance for 1 year which completed 08/07/2016 2. Followed by adjuvant hormonal therapy with anastrozole started 11/28/2015 ---------------------------------------------------------------------------------------------------------- Current treatment:  Echocardiogram 05/22/16 LVEF 60-65% Anastrozole started 11/28/15  Anastrozole Toxicities:  Diffuse myalgias and arthralgias. I discussed with her about switching her to letrozole therapy.   Diffuse myalgias and arthralgias bone density test 04/12/2016: T score -2.2. Osteopenia. She is taking Fosamax and calcium and vitamin D.  Fosamax toxicities:Tolerating it well Anxiety: On antianxiety medications.   Memory loss: I discussed with her about getting screened for memory loss study called REMEMBER. The clinical trial randomizes patients to observation versus Aricept. She continues to have problems with anxiety.  Return to clinic in 3 months for follow-up to assess tolerability to letrozole  I spent 25 minutes talking to the patient of which more than half was spent in counseling and coordination of care.  No orders of the defined types were placed in this encounter.  The patient has a good understanding of the overall plan. she agrees with it. she will call with any problems that may develop before the next visit here.   Harriette Ohara, MD 02/11/17

## 2017-02-11 NOTE — Assessment & Plan Note (Signed)
Left simple mastectomy with reconstruction 06/24/2015: IDC grade 3, 2.5 cm, with high-grade DCIS, ALH, LVI present, margins negative, 0/1 sentinel node negative, , ER 100%, PR 5%, HER-2 positive ratio 1.56 with average copy #8.25, Ki-67 20%  Pathologic stage:T2 N0 stage II a Treatment plan: 1. Adjuvant chemotherapy with TCHP X 6 completed 10/30/17followed by Herceptin maintenance for 1 year which completed 08/07/2016 2. Followed by adjuvant hormonal therapy with anastrozole started 11/28/2015 ---------------------------------------------------------------------------------------------------------- Current treatment:  Echocardiogram 05/22/16 LVEF 60-65% Anastrozole started 11/28/15  Anastrozole Toxicities: Denies any hot flashes or myalgias. Bone density test 04/12/2016: T score -2.2. Osteopenia. She is taking Fosamax and calcium and vitamin D.  Fosamax toxicities:Tolerating it well Anxiety: On antianxiety medications  Return to clinic in 6 months for follow-up

## 2017-02-12 ENCOUNTER — Telehealth: Payer: Self-pay | Admitting: Hematology and Oncology

## 2017-02-12 NOTE — Telephone Encounter (Signed)
Mailed patient calendar of upcoming May appointments.  °

## 2017-02-18 ENCOUNTER — Ambulatory Visit (INDEPENDENT_AMBULATORY_CARE_PROVIDER_SITE_OTHER): Payer: BLUE CROSS/BLUE SHIELD | Admitting: Orthopaedic Surgery

## 2017-02-19 ENCOUNTER — Ambulatory Visit (INDEPENDENT_AMBULATORY_CARE_PROVIDER_SITE_OTHER): Payer: BLUE CROSS/BLUE SHIELD | Admitting: Orthopaedic Surgery

## 2017-02-19 ENCOUNTER — Encounter (INDEPENDENT_AMBULATORY_CARE_PROVIDER_SITE_OTHER): Payer: Self-pay | Admitting: Orthopaedic Surgery

## 2017-02-19 DIAGNOSIS — M25561 Pain in right knee: Secondary | ICD-10-CM

## 2017-02-19 MED ORDER — LIDOCAINE HCL 1 % IJ SOLN
2.0000 mL | INTRAMUSCULAR | Status: AC | PRN
  Administered 2017-02-19: 2 mL

## 2017-02-19 MED ORDER — METHYLPREDNISOLONE ACETATE 40 MG/ML IJ SUSP
80.0000 mg | INTRAMUSCULAR | Status: AC | PRN
  Administered 2017-02-19: 80 mg

## 2017-02-19 MED ORDER — BUPIVACAINE HCL 0.5 % IJ SOLN
2.0000 mL | INTRAMUSCULAR | Status: AC | PRN
  Administered 2017-02-19: 2 mL via INTRA_ARTICULAR

## 2017-02-19 NOTE — Progress Notes (Signed)
Office Visit Note   Patient: Heidi Walls           Date of Birth: 1956/01/08           MRN: [id number] Visit Date: 02/19/2017              Requested by: Terald Sleeper, PA-C [address] [place], [place] [address] PCP: Terald Sleeper, PA-C   Assessment & Plan: Visit Diagnoses:  1. Acute pain of right knee     Plan:  #1: Corticosteroid injection performed to the right knee tolerated procedure well. #2: If this is not beneficial then she will give [place] a call and we will schedule her an MRI scan and a follow-up appointment.  Follow-Up Instructions: Return if symptoms worsen or fail to improve.   Orders:  No orders of the defined types were placed in this encounter.  No orders of the defined types were placed in this encounter.     Procedures: Large Joint Inj: R knee on 02/19/2017 3:23 PM Indications: pain and diagnostic evaluation Details: 25 G 1.5 in needle, anteromedial approach  Arthrogram: No  Medications: 2 mL lidocaine 1 %; 2 mL bupivacaine 0.5 %; 80 mg methylPREDNISolone acetate 40 MG/ML Procedure, treatment alternatives, risks and benefits explained, specific risks discussed. Consent was given by the patient. Immediately prior to procedure a time out was called to verify the correct patient, procedure, equipment, support staff and site/side marked as required. Patient was prepped and draped in the usual sterile fashion.       Clinical Data: No additional findings.   Subjective: Chief Complaint  Patient presents with  . Right Knee - Pain    History Removed bakers cyst on back of l knee and surgery. r knee pain and it goes out at times, left knee stays swollen.     HPI  Heidi Walls is a very pleasant 62 year old white female who is seen today for follow-up of her left knee which she states the injection has been wonderful.  She has minimal if any symptoms.  However she has developed some right knee pain but more mechanical catching pain.  She states that she is  walking and doing certain activities it feels a something pops out of place and she cannot bear weight and then will pop back in.  Her pain is mainly along the medial aspect of the knee.  Denies any recent history of injury or trauma.  She complains of enlargement of the posterior aspect of the knee that of a Baker's cyst.  Review of Systems  Constitutional: Negative.  Negative for chills, fatigue and fever.  HENT: Negative.  Negative for hearing loss and tinnitus.   Eyes: Negative.  Negative for itching.  Respiratory: Negative.  Negative for chest tightness and shortness of breath.   Cardiovascular: Negative.  Negative for chest pain, palpitations and leg swelling.  Gastrointestinal: Negative.  Negative for blood in stool, constipation and diarrhea.  Endocrine: Negative for polyuria.  Genitourinary: Negative for dysuria.  Musculoskeletal: Positive for myalgias. Negative for back pain, joint swelling, neck pain and neck stiffness.  Allergic/Immunologic: Negative for immunocompromised state.  Neurological: Negative.  Negative for dizziness, numbness and headaches.  Hematological: Does not bruise/bleed easily.  Psychiatric/Behavioral: Negative for sleep disturbance. The patient is not nervous/anxious.      Objective: Vital Signs: There were no vitals taken for this visit.  Physical Exam  Constitutional: She is oriented to person, place, and time. She appears well-developed and well-nourished.  HENT:  Head: Normocephalic and atraumatic.  Eyes: EOM are normal. Pupils are equal, round, and reactive to light.  Pulmonary/Chest: Effort normal.  Neurological: She is alert and oriented to person, place, and time.  Skin: Skin is warm and dry.  Psychiatric: She has a normal mood and affect. Her behavior is normal. Judgment and thought content normal.    Ortho Exam  Exam today reveals range of motion 0-120 degrees.  She does have tenderness to palpation over the medial joint line.  Negative  McMurray's at this time.  Negative Lockman's and drawer.  Calf is supple nontender.  Good motion of her hip without pain or discomfort.  Specialty Comments:  No specialty comments available.  Imaging: No results found.   PMFS History: Patient Active Problem List   Diagnosis Date Noted  . Arthritis of right knee 01/29/2017  . Baker's cyst of knee, left 01/02/2017  . Perennial allergic rhinitis with a probable nonallergic component 12/12/2016  . Neuropathy 11/19/2016  . Chronic pain 11/19/2016  . Moderate persistent asthma 07/10/2016  . Bronchitis 07/10/2016  . Chemotherapy induced diarrhea 10/17/2015  . Osteoporosis 09/07/2015  . Generalized anxiety disorder 09/07/2015  . Osteoarthritis of right knee 09/07/2015  . GERD (gastroesophageal reflux disease) 09/07/2015  . Depression 09/07/2015  . Irritability 09/07/2015  . Acquired absence of left breast and nipple 06/30/2015  . Breast cancer of upper-inner quadrant of left female breast ([facility]) 05/26/2015   Past Medical History:  Diagnosis Date  . Anemia   . Anxiety    OCD  . Arthritis    knees and hips  . Asthma   . Breast cancer ([facility])   . Breast cancer of upper-inner quadrant of left female breast ([facility]) 05/26/2015  . GERD (gastroesophageal reflux disease)   . Hypertension   . Insomnia     Family History  Problem Relation Age of Onset  . Heart failure Mother   . Diabetes Mother   . Hypertension Mother   . Hypertension Father   . Breast cancer Maternal Grandmother   . Allergic rhinitis Sister   . Sinusitis Sister   . Allergic rhinitis Daughter   . Allergic rhinitis Son   . Sinusitis Son   . Bronchitis Grandchild   . Asthma Neg Hx   . Eczema Neg Hx   . Urticaria Neg Hx   . Immunodeficiency Neg Hx   . Angioedema Neg Hx     Past Surgical History:  Procedure Laterality Date  . BREAST RECONSTRUCTION WITH PLACEMENT OF TISSUE EXPANDER AND FLEX HD (ACELLULAR HYDRATED DERMIS) Left 06/24/2015   Procedure: LEFT BREAST  RECONSTRUCTION WITH PLACEMENT OF TISSUE EXPANDER AND  ACELLULAR HYDRATED DERMIS (CORTIVA);  Surgeon: Irene Limbo, MD;  Location: [facility];  Service: Plastics;  Laterality: Left;  . KNEE SURGERY Right 2000   arthroscopy  . LIPOSUCTION WITH LIPOFILLING Left 12/23/2015   Procedure: LIPOFILLING TO LEFT CHEST FROM ABDOMEN  ;  Surgeon: Irene Limbo, MD;  Location: [facility];  Service: Plastics;  Laterality: Left;  Marland Kitchen MASTECTOMY W/ SENTINEL NODE BIOPSY Left 06/24/2015   Procedure: LEFT TOTAL MASTECTOMY WITH SENTINEL LYMPH NODE BIOPSY;  Surgeon: Excell Seltzer, MD;  Location: [facility];  Service: General;  Laterality: Left;  Marland Kitchen MASTOPEXY Right 12/23/2015   Procedure: RIGHT BREAST MASTOPEXY;  Surgeon: Irene Limbo, MD;  Location: [facility];  Service: Plastics;  Laterality: Right;  . MICROLARYNGOSCOPY W/VOCAL CORD INJECTION N/A 10/15/2016   Procedure: SUSPENDED DIRECT MICROLARYNGOSCOPY WITH VOCAL CORD INJECTION  WITH JET VENTILATION;  Surgeon: Melida Quitter, MD;  Location: [facility];  Service: ENT;  Laterality: N/A;  . PORTACATH PLACEMENT Right 06/24/2015   Procedure: INSERTION PORT-A-CATH;  Surgeon: Excell Seltzer, MD;  Location: [facility];  Service: General;  Laterality: Right;  . portacath removal  09/05/2016  . REMOVAL OF BILATERAL TISSUE EXPANDERS WITH PLACEMENT OF BILATERAL BREAST IMPLANTS Left 12/23/2015   Procedure: REMOVAL OF LEFT  TISSUE EXPANDERS WITH PLACEMENT OF LEFT  BREAST IMPLANTS;  Surgeon: Irene Limbo, MD;  Location: [facility];  Service: Plastics;  Laterality: Left;  . TONSILLECTOMY     Social History   Occupational History  . Not on file  Tobacco Use  . Smoking status: Former Smoker    Packs/day: 1.00    Years: 25.00    Pack years: 25.00    Types: Cigarettes    Last attempt to quit: 10/09/2005    Years since quitting: 11.3  . Smokeless tobacco: Never Used  Substance  and Sexual Activity  . Alcohol use: No    Frequency: Never  . Drug use: No  . Sexual activity: Not on file

## 2017-02-21 ENCOUNTER — Other Ambulatory Visit: Payer: Self-pay | Admitting: Physician Assistant

## 2017-02-21 MED ORDER — AZITHROMYCIN 250 MG PO TABS
ORAL_TABLET | ORAL | 0 refills | Status: DC
Start: 2017-02-21 — End: 2017-03-20

## 2017-02-25 ENCOUNTER — Other Ambulatory Visit: Payer: Self-pay | Admitting: Physician Assistant

## 2017-02-25 MED ORDER — VALACYCLOVIR HCL 1 G PO TABS: 1000.0000 mg | ORAL_TABLET | Freq: Two times a day (BID) | ORAL | 6 refills | Status: DC

## 2017-02-27 ENCOUNTER — Other Ambulatory Visit: Payer: Self-pay | Admitting: Physician Assistant

## 2017-02-27 DIAGNOSIS — G893 Neoplasm related pain (acute) (chronic): Secondary | ICD-10-CM

## 2017-03-15 ENCOUNTER — Other Ambulatory Visit: Payer: Self-pay | Admitting: Physician Assistant

## 2017-03-15 DIAGNOSIS — F411 Generalized anxiety disorder: Secondary | ICD-10-CM

## 2017-03-20 ENCOUNTER — Encounter: Payer: Self-pay | Admitting: Allergy & Immunology

## 2017-03-20 ENCOUNTER — Ambulatory Visit: Payer: BLUE CROSS/BLUE SHIELD | Admitting: Allergy & Immunology

## 2017-03-20 VITALS — BP 140/72 | HR 77 | Temp 98.1°F | Resp 16

## 2017-03-20 DIAGNOSIS — K219 Gastro-esophageal reflux disease without esophagitis: Secondary | ICD-10-CM | POA: Diagnosis not present

## 2017-03-20 DIAGNOSIS — J3089 Other allergic rhinitis: Secondary | ICD-10-CM | POA: Diagnosis not present

## 2017-03-20 DIAGNOSIS — J4540 Moderate persistent asthma, uncomplicated: Secondary | ICD-10-CM

## 2017-03-20 MED ORDER — CARBINOXAMINE MALEATE 6 MG PO TABS: 1.0000 | ORAL_TABLET | Freq: Four times a day (QID) | ORAL | 5 refills | Status: DC | PRN

## 2017-03-20 NOTE — Progress Notes (Signed)
FOLLOW UP  Date of Service/Encounter:  03/20/17   Assessment:   Moderate persistent asthma without complication   Perennial allergic rhinitis (dust mites, indoor molds)  Gastroesophageal reflux disease  Peroneal allergic rhinitis with a probable nonallergic component (indoor molds, dust mites)  History of breast cancer s/p chemotherapy (five months cancer free)   Asthma Reportables:  Severity: moderate persistent  Risk: low Control: well controlled  Plan/Recommendations:   1. Moderate persistent asthma without complication - Lung testing looks great today. - We will not make any medication changes at this time - I do not think that the addition of another inhaler medication with help with the cough since this is likely related to your vocal cord issues.  - Daily controller medication(s): Symbicort 160/4.63mcg two puffs twice daily with spacer - Prior to physical activity: ProAir 2 puffs 10-15 minutes before physical activity. - Rescue medications: ProAir 4 puffs every 4-6 hours as needed - Asthma control goals:  * Full participation in all desired activities (may need albuterol before activity) * Albuterol use two time or less a week on average (not counting use with activity) * Cough interfering with sleep two time or less a month * Oral steroids no more than once a year * No hospitalizations  2. Perennial allergic rhinitis (molds, dust mite) - Continue with Astelin two sprays per nostril daily (try it daily for a couple of weeks to see if consistent use helps with the symptoms) - Continue with the Ryvent every 6-8 hours as needed.  - Allergy shots are a possibility, but many of your symptoms might be related to your chemotherapy.  - You were only positive to indoor molds and dust mites, so I am unsure that shots would help.   3. Gastroesophageal reflux disease - Continue with Nexium daily. - Continue with ranitidine 150mg  twice daily as needed.  4. Return in  about 6 months (around 09/20/2017).  Subjective:   Heidi Walls is a 62 y.o. female presenting today for follow up of  Chief Complaint  Patient presents with  . Allergic Rhinitis   . Asthma  . Hoarse    Heidi Walls has a history of the following: Patient Active Problem List   Diagnosis Date Noted  . Arthritis of right knee 01/29/2017  . Baker's cyst of knee, left 01/02/2017  . Perennial allergic rhinitis with a probable nonallergic component 12/12/2016  . Neuropathy 11/19/2016  . Chronic pain 11/19/2016  . Moderate persistent asthma 07/10/2016  . Bronchitis 07/10/2016  . Chemotherapy induced diarrhea 10/17/2015  . Osteoporosis 09/07/2015  . Generalized anxiety disorder 09/07/2015  . Osteoarthritis of right knee 09/07/2015  . GERD (gastroesophageal reflux disease) 09/07/2015  . Depression 09/07/2015  . Irritability 09/07/2015  . Acquired absence of left breast and nipple 06/30/2015  . Breast cancer of upper-inner quadrant of left female breast ([facility]) 05/26/2015    History obtained from: chart review and patient.  [facility] Primary Care Provider is Terald Sleeper, PA-C.     [name] is a 62 y.o. female presenting for a follow up visit. She was last seen in December 2018 by Dr. Verlin Fester. At that time, she wasdiagnosed with moderate persistent asthma. She was started on high dose Symbicort twice daily as well as albuterol as needed. She had testing that was positive to molds and dust mites on intradermal testing, but it was thought that she had a non-allergic to her rhinitis as well. She was started on nasal saline rinses as  well as azelastine BID. She was also started on Ryvent. GERD was controlled with Nexium and ranitidine was added BID as needed.   Since the last visit, she has mostly done well. She has had some problems recently when she was watching her grand daughter's soccer game outside. Despite the testing that was only responsive to the indoor allergens, she  continues to have problems outdoors more than indoors. She has not been using her Astelin regularly and she hates the taste. She also tells me that she hates nose sprays in general. She is on the Ryvent which is providing relief. However, she continues to have marked drowsiness from it. She does like how well it works, however. She has rhinorrhea in the mornings but then the congestion starts in the middle of the day.   [name]'s asthma has been well controlled. She has not required rescue medication, experienced nocturnal awakenings due to lower respiratory symptoms, nor have activities of daily living been limited. She has required no Emergency Department or Urgent Care visits for her asthma. She has required zero courses of systemic steroids for asthma exacerbations since the last visit. ACT score today is 20, indicating excellent asthma symptom control. She does like the Symbicort and is using this faithfully. She thinks that this has provided relief. She continues to have a hoarse voice, especially with laughing (which is a problem today). But she attributes this (rightly so) to her history of vocal cord issues. She evidently has had vocal cord surgery.   She did have a memory test due to her chemotherapy and evidently she failed it. She is now five month cancer free. She is hoping that her memory will continue to improve over time.   Otherwise, there have been no changes to her past medical history, surgical history, family history, or social history.    Review of Systems: a 14-point review of systems is pertinent for what is mentioned in HPI.  Otherwise, all other systems were negative. Constitutional: negative other than that listed in the HPI Eyes: negative other than that listed in the HPI Ears, nose, mouth, throat, and face: negative other than that listed in the HPI Respiratory: negative other than that listed in the HPI Cardiovascular: negative other than that listed in the  HPI Gastrointestinal: negative other than that listed in the HPI Genitourinary: negative other than that listed in the HPI Integument: negative other than that listed in the HPI Hematologic: negative other than that listed in the HPI Musculoskeletal: negative other than that listed in the HPI Neurological: negative other than that listed in the HPI Allergy/Immunologic: negative other than that listed in the HPI    Objective:   Blood pressure 140/72, pulse 77, temperature 98.1 F (36.7 C), temperature source Oral, resp. rate 16, SpO2 98 %. There is no height or weight on file to calculate BMI.   Physical Exam:  General: Alert, interactive, in no acute distress. Pleasant and very amusing female.  Eyes: No conjunctival injection bilaterally, no discharge on the right, no discharge on the left and no Horner-Trantas dots present. PERRL bilaterally. EOMI without pain. No photophobia.  Ears: Right TM pearly gray with normal light reflex, Left TM pearly gray with normal light reflex, Right TM intact without perforation and Left TM intact without perforation.  Nose/Throat: External nose within normal limits and septum midline. Turbinates edematous and pale without discharge. Posterior oropharynx mildly erythematous without cobblestoning in the posterior oropharynx. Tonsils 2+ without exudates.  Tongue without thrush. Lungs: Clear to  auscultation without wheezing, rhonchi or rales. No increased work of breathing. CV: Normal S1/S2. No murmurs. Capillary refill <2 seconds.  Skin: Warm and dry, without lesions or rashes. Neuro:   Grossly intact. No focal deficits appreciated. Responsive to questions.  Diagnostic studies:   Spirometry: results normal (FEV1: 2.24/92%, FVC: 2.79/88%, FEV1/FVC: 80%).    Spirometry consistent with normal pattern.   Allergy Studies: none     Salvatore Marvel, MD [facility]

## 2017-03-20 NOTE — Patient Instructions (Addendum)
1. Moderate persistent asthma without complication - Lung testing looks great today. - We will not make any medication changes at this time - I do not think that the addition of another inhaler medication with help with the cough since this is likely related to your vocal cord issues.  - Daily controller medication(s): Symbicort 160/4.2mcg two puffs twice daily with spacer - Prior to physical activity: ProAir 2 puffs 10-15 minutes before physical activity. - Rescue medications: ProAir 4 puffs every 4-6 hours as needed - Asthma control goals:  * Full participation in all desired activities (may need albuterol before activity) * Albuterol use two time or less a week on average (not counting use with activity) * Cough interfering with sleep two time or less a month * Oral steroids no more than once a year * No hospitalizations  2. Perennial allergic rhinitis (molds, dust mite) - Continue with Astelin two sprays per nostril daily (try it daily for a couple of weeks to see if consistent use helps with the symptoms) - Continue with the Ryvent every 6-8 hours as needed.  - Allergy shots are a possibility, but many of your symptoms might be related to your chemotherapy.  - You were only positive to indoor molds and dust mites, so I am unsure that shots would help.   3. Gastroesophageal reflux disease - Continue with Nexium daily. - Continue with ranitidine 150mg  twice daily as needed.  4. Return in about 6 months (around 09/20/2017).   Please inform us of any Emergency Department visits, hospitalizations, or changes in symptoms. Call us before going to the ED for breathing or allergy symptoms since we might be able to fit you in for a sick visit. Feel free to contact us anytime with any questions, problems, or concerns.  It was a pleasure to meet you today!  Websites that have reliable patient information: 1. American Academy of Asthma, Allergy, and Immunology: www.aaaai.org 2. Food Allergy  Research and Education (FARE): foodallergy.org 3. Mothers of Asthmatics: http://www.asthmacommunitynetwork.org 4. American College of Allergy, Asthma, and Immunology: www.acaai.org

## 2017-03-22 ENCOUNTER — Other Ambulatory Visit: Payer: Self-pay | Admitting: Physician Assistant

## 2017-04-01 ENCOUNTER — Other Ambulatory Visit: Payer: Self-pay | Admitting: *Deleted

## 2017-04-01 DIAGNOSIS — C50212 Malignant neoplasm of upper-inner quadrant of left female breast: Secondary | ICD-10-CM

## 2017-04-01 DIAGNOSIS — Z170 Estrogen receptor positive status [ER+]: Secondary | ICD-10-CM

## 2017-04-03 ENCOUNTER — Other Ambulatory Visit: Payer: Self-pay

## 2017-04-03 ENCOUNTER — Ambulatory Visit: Payer: BLUE CROSS/BLUE SHIELD | Attending: Hematology and Oncology | Admitting: Physical Therapy

## 2017-04-03 ENCOUNTER — Encounter: Payer: Self-pay | Admitting: Physical Therapy

## 2017-04-03 DIAGNOSIS — R293 Abnormal posture: Secondary | ICD-10-CM | POA: Diagnosis not present

## 2017-04-03 DIAGNOSIS — I972 Postmastectomy lymphedema syndrome: Secondary | ICD-10-CM

## 2017-04-03 DIAGNOSIS — M6281 Muscle weakness (generalized): Secondary | ICD-10-CM | POA: Insufficient documentation

## 2017-04-03 NOTE — Therapy (Signed)
[facility], [place], [address] Phone: [phone]   Fax:  [phone]  Physical Therapy Evaluation  Patient Details  Name: Heidi Walls MRN: [id number] Date of Birth: Feb 04, 1955 Referring Provider: Lindi Adie   Encounter Date: 04/03/2017  PT End of Session - 04/03/17 1110    Visit Number  1    Number of Visits  9    Date for PT Re-Evaluation  05/01/17    PT Start Time  0851    PT Stop Time  0933    PT Time Calculation (min)  42 min    Activity Tolerance  Patient tolerated treatment well    Behavior During Therapy  [facility] for tasks assessed/performed       Past Medical History:  Diagnosis Date  . Anemia   . Anxiety    OCD  . Arthritis    knees and hips  . Asthma   . Breast cancer ([place])   . Breast cancer of upper-inner quadrant of left female breast ([place]) 05/26/2015  . GERD (gastroesophageal reflux disease)   . Hypertension   . Insomnia     Past Surgical History:  Procedure Laterality Date  . BREAST RECONSTRUCTION WITH PLACEMENT OF TISSUE EXPANDER AND FLEX HD (ACELLULAR HYDRATED DERMIS) Left 06/24/2015   Procedure: LEFT BREAST RECONSTRUCTION WITH PLACEMENT OF TISSUE EXPANDER AND  ACELLULAR HYDRATED DERMIS (CORTIVA);  Surgeon: Irene Limbo, MD;  Location: [place];  Service: Plastics;  Laterality: Left;  . KNEE SURGERY Right 2000   arthroscopy  . LIPOSUCTION WITH LIPOFILLING Left 12/23/2015   Procedure: LIPOFILLING TO LEFT CHEST FROM ABDOMEN  ;  Surgeon: Irene Limbo, MD;  Location: [place];  Service: Plastics;  Laterality: Left;  Marland Kitchen MASTECTOMY W/ SENTINEL NODE BIOPSY Left 06/24/2015   Procedure: LEFT TOTAL MASTECTOMY WITH SENTINEL LYMPH NODE BIOPSY;  Surgeon: Excell Seltzer, MD;  Location: [place];  Service: General;  Laterality: Left;  Marland Kitchen MASTOPEXY Right 12/23/2015   Procedure: RIGHT BREAST MASTOPEXY;  Surgeon: Irene Limbo, MD;   Location: [place];  Service: Plastics;  Laterality: Right;  . MICROLARYNGOSCOPY W/VOCAL CORD INJECTION N/A 10/15/2016   Procedure: SUSPENDED DIRECT MICROLARYNGOSCOPY WITH VOCAL CORD INJECTION WITH JET VENTILATION;  Surgeon: Melida Quitter, MD;  Location: [place];  Service: ENT;  Laterality: N/A;  . PORTACATH PLACEMENT Right 06/24/2015   Procedure: INSERTION PORT-A-CATH;  Surgeon: Excell Seltzer, MD;  Location: [place];  Service: General;  Laterality: Right;  . portacath removal  09/05/2016  . REMOVAL OF BILATERAL TISSUE EXPANDERS WITH PLACEMENT OF BILATERAL BREAST IMPLANTS Left 12/23/2015   Procedure: REMOVAL OF LEFT  TISSUE EXPANDERS WITH PLACEMENT OF LEFT  BREAST IMPLANTS;  Surgeon: Irene Limbo, MD;  Location: [place];  Service: Plastics;  Laterality: Left;  . TONSILLECTOMY      There were no vitals filed for this visit.   Subjective Assessment - 04/03/17 0856    Subjective  I have had swelling in my torso since last August. I noticed the arm swelling a week or so ago. I had a mastectomy 06/24/15 and SLNB.  I have trouble wearing t shirts and bathing suits due to the swelling.     Pertinent History  left breast cancer, hx of mastectomy 06/24/15 and SLNB, osteoporosis, neuropathy, asthma, decreased vision due to chemo, completed chemo in July 2018, pt did not require radiation, Bakers cyst    Patient Stated Goals  to get my swelling  to go away    Currently in Pain?  No/denies         [facility] PT Assessment - 04/03/17 0001      Assessment   Medical Diagnosis  left breast cancer    Referring Provider  Gudena    Onset Date/Surgical Date  06/24/15    Hand Dominance  Right    Prior Therapy  none      Precautions   Precautions  Other (comment) at risk for lymphedema      Restrictions   Weight Bearing Restrictions  No      Balance Screen   Has the patient fallen in the past 6 months  Yes    How many times?  1 fell out of chair  while standing on the chair    Has the patient had a decrease in activity level because of a fear of falling?   Yes pt reports dizziness with quick change of postion    Is the patient reluctant to leave their home because of a fear of falling?   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, license [employer]    Leisure  pt takes water aerobics- has not been in 3-4 weeks due to asthma and bronchitis      Cognition   Overall Cognitive Status  Within Functional Limits for tasks assessed      Observation/Other Assessments   Observations  increased fullness at left lateral trunk and UE, pt also has visible swelling in LLE    Other Surveys   -- LLIS: 40% impairment      ROM / Strength   AROM / PROM / Strength  AROM;Strength      AROM   Overall AROM   Within functional limits for tasks performed      Strength   Overall Strength  Deficits L sh abd 4/5, flexion 4+/5        LYMPHEDEMA/ONCOLOGY QUESTIONNAIRE - 04/03/17 0918      Type   Cancer Type  Left breast cancer      Surgeries   Mastectomy Date  06/24/15    Number Lymph Nodes Removed  -- SLNB      Date Lymphedema/Swelling Started   Date  08/09/15      Treatment   Active Chemotherapy Treatment  No    Past Chemotherapy Treatment  Yes    Active Radiation Treatment  No    Past Radiation Treatment  No    Current Hormone Treatment  Yes    Drug Name  Letrozole    Past Hormone Therapy  No      What other symptoms do you have   Are you Having Heaviness or  Tightness  Yes    Are you having Pain  Yes    Are you having pitting edema  No    Is it Hard  or Difficult finding clothes that fit  Yes    Do you have infections  No    Is there Decreased scar mobility  No      Lymphedema Assessments   Lymphedema Assessments  Upper extremities      Right Upper Extremity Lymphedema   15 cm Proximal to Olecranon Process  32.6 cm    Olecranon Process  25.3 cm    15 cm Proximal to Ulnar Styloid Process  23 cm    Just Proximal to Ulnar Styloid Process  15.3 cm    Across Hand at [employer]  18.3 cm    At [place] of 2nd Digit  5.9 cm      Left Upper Extremity Lymphedema   15 cm Proximal to Olecranon Process  33.5 cm    Olecranon Process  25 cm    15 cm Proximal to Ulnar Styloid Process  23.3 cm    Just Proximal to Ulnar Styloid Process  15 cm    Across Hand at [employer]  18.2 cm    At [place] of 2nd Digit  5.5 cm          Quick Dash - 04/03/17 0001    Open a tight or new jar  Moderate difficulty    Do heavy household chores (wash walls, wash floors)  Severe difficulty    Carry a shopping bag or briefcase  Severe difficulty    Wash your back  Severe difficulty    Use a knife to cut food  Moderate difficulty    Recreational activities in which you take some force or impact through your arm, shoulder, or hand (golf, hammering, tennis)  Unable    During the past week, to what extent has your arm, shoulder or hand problem interfered with your normal social activities with family, friends, neighbors, or groups?  Modererately    During the past week, to what extent has your arm, shoulder or hand problem limited your work or other regular daily activities  Modererately    Arm, shoulder, or hand pain.  Moderate    Tingling (pins and needles) in your arm, shoulder, or hand  Moderate    Difficulty Sleeping  No difficulty    DASH Score  56.82 %      No data recorded  Objective measurements completed on examination: See above findings.               PT Education - 04/03/17 1110    Education provided  Yes    Education Details  anatomy and physiology of lymphatic system    Person(s) Educated  Patient    Methods  Explanation    Comprehension  Verbalized understanding          PT Long Term Goals - 04/03/17 1118      PT LONG TERM GOAL #1   Title  Pt will be independent in self MLD to manage her left UE and lateral trunk swelling    Time  4    Period  Weeks    Status  New    Target Date  05/01/17      PT LONG TERM GOAL #2   Title  Pt will be independent in a home exercise program for continued stretching and strengthening     Time  4    Period  Weeks    Status  New    Target Date  05/01/17      PT LONG TERM GOAL #3  Title  Pt will report no pain or tenderness in area of left breast where increased tightness can be palpated to allow improved comfort    Time  4    Period  Weeks    Status  New    Target Date  05/01/17      PT LONG TERM GOAL #4   Title  Pt will receive appropriate compression garments for long term management of lymphedema    Time  4    Period  Weeks    Status  New    Target Date  05/01/17             Plan - 04/03/17 1111    Clinical Impression Statement  Pt presents to PT with LUE and left lateral trunk lymphedema following a left mastectomy and SLNB on 06/24/15. Pt reports her lateral trunk because swelling in August of 2017 and she just noticed her arm swelling a few weeks ago. Circumferential measurements do not demonstrate greater than a 2 cm difference in left arm vs right arm. Her swelling in her arm is in the very early stages of lymphedema where she feels heaviness and there is some swelling but it is mild. Her lateral trunk is full compared to right side and she has increased swelling at anterior axilla. This prevents her from wearing tank tops and bathing suits. She also has left lower extremity swelling that began with chemotherapy that she completed in 2017 but her  swelling remains. This swelling is visible through her pants. Her left shoulder ROM is WFL but she has decreased strength in left shoulder compared to right. Pt would benefit from skilled PT services to decrease left UE and trunk swelling and assist pt with obtaining appropriate compression garments and increase left shoulder strength. Once she is able to indpendently manage her LUE lymphedema may focus on left LE swelling.     History and Personal Factors relevant to plan of care:  pt has osteoarthritis, pt has neuropathy and increased sensitivity to skin in LUE    Clinical Presentation  Evolving    Clinical Presentation due to:  pt recently began having swelling in LUE and has been having swelling in LLE    Rehab Potential  Excellent    PT Frequency  2x / week    PT Duration  4 weeks    PT Treatment/Interventions  ADLs/Self Care Home Management;Therapeutic exercise;Therapeutic activities;Scar mobilization;Passive range of motion;Manual techniques;Manual lymph drainage;Compression bandaging;Taping;Vasopneumatic Device;Patient/family education    PT Next Visit Plan  instruct pt in self MLD for LUE and left lateral trunk and issue handout, pt may decrease to 1x/wk once she is indep with MLD, give supine scap, instruct in lymphedema risk reduction practices, send rx for sleeve and compression bra    Consulted and Agree with Plan of Care  Patient       Patient will benefit from skilled therapeutic intervention in order to improve the following deficits and impairments:  Pain, Postural dysfunction, Decreased strength, Increased edema  Visit Diagnosis: Postmastectomy lymphedema  Muscle weakness (generalized)  Abnormal posture     Problem List Patient Active Problem List   Diagnosis Date Noted  . Arthritis of right knee 01/29/2017  . Baker's cyst of knee, left 01/02/2017  . Perennial allergic rhinitis with a probable nonallergic component 12/12/2016  . Neuropathy 11/19/2016  . Chronic pain  11/19/2016  . Moderate persistent asthma 07/10/2016  . Bronchitis 07/10/2016  . Chemotherapy induced diarrhea 10/17/2015  . Osteoporosis 09/07/2015  .  Generalized anxiety disorder 09/07/2015  . Osteoarthritis of right knee 09/07/2015  . GERD (gastroesophageal reflux disease) 09/07/2015  . Depression 09/07/2015  . Irritability 09/07/2015  . Acquired absence of left breast and nipple 06/30/2015  . Breast cancer of upper-inner quadrant of left female breast ([place]) 05/26/2015    Allyson Sabal [facility] 04/03/2017, [place], [place], [address] Phone: [phone]   Fax:  [phone]  Name: Heidi Walls MRN: [id number] Date of Birth: [birth year]  Manus Gunning, PT 04/03/17 11:22 AM

## 2017-04-03 NOTE — Addendum Note (Signed)
Addended by: Wynelle Beckmann, Hilaria Ota L on: 04/03/2017 11:24 AM   Modules accepted: Orders

## 2017-04-04 ENCOUNTER — Ambulatory Visit: Payer: BLUE CROSS/BLUE SHIELD

## 2017-04-04 DIAGNOSIS — M6281 Muscle weakness (generalized): Secondary | ICD-10-CM | POA: Diagnosis not present

## 2017-04-04 DIAGNOSIS — I972 Postmastectomy lymphedema syndrome: Secondary | ICD-10-CM

## 2017-04-04 DIAGNOSIS — R293 Abnormal posture: Secondary | ICD-10-CM | POA: Diagnosis not present

## 2017-04-04 NOTE — Patient Instructions (Signed)
Cancer Rehab [phone] Start with circles near neck above collarbones. 10 times  Deep Effective Breath   Standing, sitting, or laying down, place both hands on the belly. Take a deep breath IN, expanding the belly; then breath OUT, contracting the belly. Repeat __5__ times. Do __2-3__ sessions per day and before your self massage.  Axilla to Axilla - Sweep   On uninvolved side make 5 circles in the armpit, then pump _5__ times from involved armpit across chest to uninvolved armpit, making a pathway. Do _1__ time per day.  Copyright  VHI. All rights reserved.  Axilla to Inguinal Nodes - Sweep   On involved side, make 5 circles at groin at panty line, then pump _5__ times from armpit along side of trunk to outer hip, making your other pathway. Do __1_ time per day.  Copyright  VHI. All rights reserved.  Arm Posterior: Elbow to Shoulder - Sweep   Pump _5__ times from back of elbow to top of shoulder. Then inner to outer upper arm _5_ times, then outer arm again _5_ times. Then back to the pathways _2-3_ times. Do _1__ time per day.  Copyright  VHI. All rights reserved.  ARM: [place] Wrist to Elbow - Sweep   Pump or stationary circles _5__ times from wrist to elbow making sure to do both sides of the forearm. Then retrace your steps to the outer arm, and the pathways _2-3_ times each. Do _1__ time per day.  Copyright  VHI. All rights reserved.  ARM: Dorsum of Hand to Shoulder - Sweep   Pump or stationary circles _5__ times on back of hand including knuckle spaces and individual fingers if needed working up towards the wrist, then retrace all your steps working back up the forearm, doing both sides; upper outer arm and back to your pathways _2-3_ times each. Then do 5 circles again at uninvolved armpit and involved groin where you started! Good job!! Do __1_ time per day.  Copyright  VHI. All rights reserved.

## 2017-04-04 NOTE — Therapy (Signed)
[place], [place], [address] Phone: [phone]   Fax:  [phone]  Physical Therapy Treatment  Patient Details  Name: Heidi Walls MRN: [id number] Date of Birth: [birth year] Referring Provider: Lindi Adie   Encounter Date: 04/04/2017  PT End of Session - 04/04/17 0919    Visit Number  2    Number of Visits  9    Date for PT Re-Evaluation  05/01/17    PT Start Time  0803    PT Stop Time  0919    PT Time Calculation (min)  76 min    Activity Tolerance  Patient tolerated treatment well    Behavior During Therapy  [facility] for tasks assessed/performed       Past Medical History:  Diagnosis Date  . Anemia   . Anxiety    OCD  . Arthritis    knees and hips  . Asthma   . Breast cancer ([place])   . Breast cancer of upper-inner quadrant of left female breast ([place]) 05/26/2015  . GERD (gastroesophageal reflux disease)   . Hypertension   . Insomnia     Past Surgical History:  Procedure Laterality Date  . BREAST RECONSTRUCTION WITH PLACEMENT OF TISSUE EXPANDER AND FLEX HD (ACELLULAR HYDRATED DERMIS) Left 06/24/2015   Procedure: LEFT BREAST RECONSTRUCTION WITH PLACEMENT OF TISSUE EXPANDER AND  ACELLULAR HYDRATED DERMIS (CORTIVA);  Surgeon: Irene Limbo, MD;  Location: [place];  Service: Plastics;  Laterality: Left;  . KNEE SURGERY Right 2000   arthroscopy  . LIPOSUCTION WITH LIPOFILLING Left 12/23/2015   Procedure: LIPOFILLING TO LEFT CHEST FROM ABDOMEN  ;  Surgeon: Irene Limbo, MD;  Location: [place];  Service: Plastics;  Laterality: Left;  Marland Kitchen MASTECTOMY W/ SENTINEL NODE BIOPSY Left 06/24/2015   Procedure: LEFT TOTAL MASTECTOMY WITH SENTINEL LYMPH NODE BIOPSY;  Surgeon: Excell Seltzer, MD;  Location: [place];  Service: General;  Laterality: Left;  Marland Kitchen MASTOPEXY Right 12/23/2015   Procedure: RIGHT BREAST MASTOPEXY;  Surgeon: Irene Limbo, MD;   Location: [place];  Service: Plastics;  Laterality: Right;  . MICROLARYNGOSCOPY W/VOCAL CORD INJECTION N/A 10/15/2016   Procedure: SUSPENDED DIRECT MICROLARYNGOSCOPY WITH VOCAL CORD INJECTION WITH JET VENTILATION;  Surgeon: Melida Quitter, MD;  Location: [place];  Service: ENT;  Laterality: N/A;  . PORTACATH PLACEMENT Right 06/24/2015   Procedure: INSERTION PORT-A-CATH;  Surgeon: Excell Seltzer, MD;  Location: [place];  Service: General;  Laterality: Right;  . portacath removal  09/05/2016  . REMOVAL OF BILATERAL TISSUE EXPANDERS WITH PLACEMENT OF BILATERAL BREAST IMPLANTS Left 12/23/2015   Procedure: REMOVAL OF LEFT  TISSUE EXPANDERS WITH PLACEMENT OF LEFT  BREAST IMPLANTS;  Surgeon: Irene Limbo, MD;  Location: [place];  Service: Plastics;  Laterality: Left;  . TONSILLECTOMY      There were no vitals filed for this visit.  Subjective Assessment - 04/04/17 0806    Subjective  Nothing really new since I was evaluated Monday. I just want to do what I can to feel better.     Pertinent History  left breast cancer, hx of mastectomy 06/24/15 and SLNB, osteoporosis, neuropathy, asthma, decreased vision due to chemo, completed chemo in July 2018, pt did not require radiation, Bakers cyst    Patient Stated Goals  to get my swelling to go away    Currently in Pain?  No/denies            LYMPHEDEMA/ONCOLOGY  QUESTIONNAIRE - 04/03/17 0918      Type   Cancer Type  Left breast cancer      Surgeries   Mastectomy Date  06/24/15    Number Lymph Nodes Removed  -- SLNB      Date Lymphedema/Swelling Started   Date  08/09/15      Treatment   Active Chemotherapy Treatment  No    Past Chemotherapy Treatment  Yes    Active Radiation Treatment  No    Past Radiation Treatment  No    Current Hormone Treatment  Yes    Drug Name  Letrozole    Past Hormone Therapy  No      What other symptoms do you have   Are you Having Heaviness or Tightness   Yes    Are you having Pain  Yes    Are you having pitting edema  No    Is it Hard or Difficult finding clothes that fit  Yes    Do you have infections  No    Is there Decreased scar mobility  No      Lymphedema Assessments   Lymphedema Assessments  Upper extremities      Right Upper Extremity Lymphedema   15 cm Proximal to Olecranon Process  32.6 cm    Olecranon Process  25.3 cm    15 cm Proximal to Ulnar Styloid Process  23 cm    Just Proximal to Ulnar Styloid Process  15.3 cm    Across Hand at [employer]  18.3 cm    At [place] of 2nd Digit  5.9 cm      Left Upper Extremity Lymphedema   15 cm Proximal to Olecranon Process  33.5 cm    Olecranon Process  25 cm    15 cm Proximal to Ulnar Styloid Process  23.3 cm    Just Proximal to Ulnar Styloid Process  15 cm    Across Hand at [employer]  18.2 cm    At [place] of 2nd Digit  5.5 cm         No data recorded       [facility] Adult PT Treatment/Exercise - 04/04/17 0001      Manual Therapy   Manual Therapy  Compression Bandaging;Manual Lymphatic Drainage (MLD)    Manual Lymphatic Drainage (MLD)  In Supine: Short neck, 5 diaphragmatic breaths, Rt axillary and Lt inguinal nodes, then anterior inter-axillary and Lt axillo-inguinal anastomosis,  then Lt UE from dorsal hand to lateral shoulder working from proximal to distal then retracing all steps instructing pt in this throughout and in basics of anatomy of lymphatic system.             PT Education - 04/04/17 [phone]    Education provided  Yes    Education Details  Self manual lymph drainage and sequencing,  basics of anatomy of lymphatic system     Person(s) Educated  Patient    Methods  Explanation;Demonstration;Handout    Comprehension  Verbalized understanding;Returned demonstration;Need further instruction          PT Long Term Goals - 04/03/17 1118      PT LONG TERM GOAL #1   Title  Pt will be independent in self MLD to manage her left UE and lateral trunk  swelling    Time  4    Period  Weeks    Status  New    Target Date  05/01/17      PT LONG  TERM GOAL #2   Title  Pt will be independent in a home exercise program for continued stretching and strengthening     Time  4    Period  Weeks    Status  New    Target Date  05/01/17      PT LONG TERM GOAL #3   Title  Pt will report no pain or tenderness in area of left breast where increased tightness can be palpated to allow improved comfort    Time  4    Period  Weeks    Status  New    Target Date  05/01/17      PT LONG TERM GOAL #4   Title  Pt will receive appropriate compression garments for long term management of lymphedema    Time  4    Period  Weeks    Status  New    Target Date  05/01/17            Plan - 04/04/17 0919    Clinical Impression Statement  Pt was able to stay for a longer session today so was able to perform full sequence of manual lymph drainage on pt while instructing/educating her about basics of anatomy of lymphatic system and sequence of the MLD. Then pt was able to return entire sequence as well, with hand over hand technique required to teach correct pressure. Also VCs to not slide on skin. Pt plans to teach her husband tonight to have him help her at home, also plans to bring him to next visit so we can assess his technique. Decided not to bandage today as pt will not return for a week and instead instructed her to focus on self MLD over next week.    Rehab Potential  Excellent    PT Frequency  2x / week    PT Duration  4 weeks    PT Treatment/Interventions  ADLs/Self Care Home Management;Therapeutic exercise;Therapeutic activities;Scar mobilization;Passive range of motion;Manual techniques;Manual lymph drainage;Compression bandaging;Taping;Vasopneumatic Device;Patient/family education    PT Next Visit Plan  Review self MLD for Lt UE and left lateral trunk and husband as well if he comes, pt may decrease to 1x/wk once she is indep with MLD, give supine  scap, instruct in lymphedema risk reduction practices, issue script if returned signed and discuss options further with pt (briefly touched on this today)    Consulted and Agree with Plan of Care  Patient       Patient will benefit from skilled therapeutic intervention in order to improve the following deficits and impairments:  Pain, Postural dysfunction, Decreased strength, Increased edema  Visit Diagnosis: Postmastectomy lymphedema     Problem List Patient Active Problem List   Diagnosis Date Noted  . Arthritis of right knee 01/29/2017  . Baker's cyst of knee, left 01/02/2017  . Perennial allergic rhinitis with a probable nonallergic component 12/12/2016  . Neuropathy 11/19/2016  . Chronic pain 11/19/2016  . Moderate persistent asthma 07/10/2016  . Bronchitis 07/10/2016  . Chemotherapy induced diarrhea 10/17/2015  . Osteoporosis 09/07/2015  . Generalized anxiety disorder 09/07/2015  . Osteoarthritis of right knee 09/07/2015  . GERD (gastroesophageal reflux disease) 09/07/2015  . Depression 09/07/2015  . Irritability 09/07/2015  . Acquired absence of left breast and nipple 06/30/2015  . Breast cancer of upper-inner quadrant of left female breast ([place]) 05/26/2015    Otelia Limes, PTA 04/04/2017, 9:32 AM  [place], [place], [address] Phone:  [phone]   Fax:  [phone]  Name: Heidi Walls MRN: [id number] Date of Birth: 07-31-1955

## 2017-04-05 ENCOUNTER — Telehealth: Payer: Self-pay

## 2017-04-05 NOTE — Telephone Encounter (Signed)
Pt.'s  ryvent is costing her now 52.45. For 30 tablets and pt. Really doesn't want to pay that much but she really likes the medicine bc it really helps. Is there something else we can prescribe her?

## 2017-04-08 NOTE — Telephone Encounter (Signed)
She has [employer], this should be able to go through as cash pay for $10 per month.  Please look into it.  thank you.

## 2017-04-09 NOTE — Telephone Encounter (Signed)
Okay, please call and carbinoxamine 4 mg every 8 hours as needed.  Thank you.

## 2017-04-09 NOTE — Telephone Encounter (Signed)
I spoke with the ryvent rep on Friday and she has been going around to the pharmacies and depending upon the pharmacy the ryvent will be costing the pt.'s anywhere between 30.00 and 60.00 dollars. I spoke to the pt.'s pharmacist this morning and she stated her insurance does not cover the ryvent.

## 2017-04-10 ENCOUNTER — Ambulatory Visit: Payer: BLUE CROSS/BLUE SHIELD

## 2017-04-10 MED ORDER — CARBINOXAMINE MALEATE 4 MG PO TABS: ORAL_TABLET | ORAL | 5 refills | Status: DC

## 2017-04-10 NOTE — Telephone Encounter (Signed)
Told pt. We will be sending in carbinoxamine 4 mg into her pharmacy. The pt. Did state she had to break down and purchase the ryvent bc she was outside a lot over the weekend.

## 2017-04-11 ENCOUNTER — Encounter: Payer: BLUE CROSS/BLUE SHIELD | Admitting: Physical Therapy

## 2017-04-14 ENCOUNTER — Other Ambulatory Visit: Payer: Self-pay | Admitting: Physician Assistant

## 2017-04-14 ENCOUNTER — Other Ambulatory Visit: Payer: Self-pay | Admitting: Family Medicine

## 2017-04-14 DIAGNOSIS — F411 Generalized anxiety disorder: Secondary | ICD-10-CM

## 2017-04-14 MED ORDER — ALPRAZOLAM 0.25 MG PO TABS: 0.2500 mg | ORAL_TABLET | Freq: Three times a day (TID) | ORAL | 3 refills | Status: DC | PRN

## 2017-04-17 ENCOUNTER — Ambulatory Visit: Payer: BLUE CROSS/BLUE SHIELD | Attending: Hematology and Oncology

## 2017-04-17 DIAGNOSIS — R293 Abnormal posture: Secondary | ICD-10-CM | POA: Diagnosis not present

## 2017-04-17 DIAGNOSIS — M6281 Muscle weakness (generalized): Secondary | ICD-10-CM | POA: Diagnosis not present

## 2017-04-17 DIAGNOSIS — I972 Postmastectomy lymphedema syndrome: Secondary | ICD-10-CM

## 2017-04-17 NOTE — Therapy (Signed)
[place], [place], [address] Phone: [phone]   Fax:  [phone]  Physical Therapy Treatment  Patient Details  Name: Heidi Walls MRN: [id number] Date of Birth: 12-17-1955 Referring Provider: Lindi Adie   Encounter Date: 04/17/2017  PT End of Session - 04/17/17 1026    Visit Number  3    Number of Visits  9    Date for PT Re-Evaluation  05/01/17    PT Start Time  0930    PT Stop Time  1027    PT Time Calculation (min)  57 min    Activity Tolerance  Patient tolerated treatment well    Behavior During Therapy  [facility] for tasks assessed/performed       Past Medical History:  Diagnosis Date  . Anemia   . Anxiety    OCD  . Arthritis    knees and hips  . Asthma   . Breast cancer ([place])   . Breast cancer of upper-inner quadrant of left female breast ([place]) 05/26/2015  . GERD (gastroesophageal reflux disease)   . Hypertension   . Insomnia     Past Surgical History:  Procedure Laterality Date  . BREAST RECONSTRUCTION WITH PLACEMENT OF TISSUE EXPANDER AND FLEX HD (ACELLULAR HYDRATED DERMIS) Left 06/24/2015   Procedure: LEFT BREAST RECONSTRUCTION WITH PLACEMENT OF TISSUE EXPANDER AND  ACELLULAR HYDRATED DERMIS (CORTIVA);  Surgeon: Irene Limbo, MD;  Location: [place];  Service: Plastics;  Laterality: Left;  . KNEE SURGERY Right 2000   arthroscopy  . LIPOSUCTION WITH LIPOFILLING Left 12/23/2015   Procedure: LIPOFILLING TO LEFT CHEST FROM ABDOMEN  ;  Surgeon: Irene Limbo, MD;  Location: [place];  Service: Plastics;  Laterality: Left;  Marland Kitchen MASTECTOMY W/ SENTINEL NODE BIOPSY Left 06/24/2015   Procedure: LEFT TOTAL MASTECTOMY WITH SENTINEL LYMPH NODE BIOPSY;  Surgeon: Excell Seltzer, MD;  Location: [place];  Service: General;  Laterality: Left;  Marland Kitchen MASTOPEXY Right 12/23/2015   Procedure: RIGHT BREAST MASTOPEXY;  Surgeon: Irene Limbo, MD;   Location: [place];  Service: Plastics;  Laterality: Right;  . MICROLARYNGOSCOPY W/VOCAL CORD INJECTION N/A 10/15/2016   Procedure: SUSPENDED DIRECT MICROLARYNGOSCOPY WITH VOCAL CORD INJECTION WITH JET VENTILATION;  Surgeon: Melida Quitter, MD;  Location: [place];  Service: ENT;  Laterality: N/A;  . PORTACATH PLACEMENT Right 06/24/2015   Procedure: INSERTION PORT-A-CATH;  Surgeon: Excell Seltzer, MD;  Location: [place];  Service: General;  Laterality: Right;  . portacath removal  09/05/2016  . REMOVAL OF BILATERAL TISSUE EXPANDERS WITH PLACEMENT OF BILATERAL BREAST IMPLANTS Left 12/23/2015   Procedure: REMOVAL OF LEFT  TISSUE EXPANDERS WITH PLACEMENT OF LEFT  BREAST IMPLANTS;  Surgeon: Irene Limbo, MD;  Location: [place];  Service: Plastics;  Laterality: Left;  . TONSILLECTOMY      There were no vitals filed for this visit.  Subjective Assessment - 04/17/17 0935    Subjective  I instructed my husband but he is still needing to work on lighter pressure. He is coming tomorrow so you can teach him the pressure. I've started having some dull, achiness at my Lt side and if I hold my arm for a minute it goes away. It feels okay right now.     Pertinent History  left breast cancer, hx of mastectomy 06/24/15 and SLNB, osteoporosis, neuropathy, asthma, decreased vision due to chemo, completed chemo in July 2018, pt did not require radiation, Bakers cyst  Patient Stated Goals  to get my swelling to go away    Currently in Pain?  No/denies                       [facility] Adult PT Treatment/Exercise - 04/17/17 0001      Shoulder Exercises: Supine   Horizontal ABduction  Strengthening;Both;10 reps;Theraband    Theraband Level (Shoulder Horizontal ABduction)  Level 1 (Yellow)    External Rotation  Strengthening;Both;10 reps;Theraband    Theraband Level (Shoulder External Rotation)  Level 1 (Yellow)    Flexion  Strengthening;Both;10  reps;Theraband Narrow and Wide Grip, 5 times each    Diagonals  Strengthening;Right;Left;10 reps;Theraband D2    Theraband Level (Shoulder Diagonals)  Level 1 (Yellow)      Manual Therapy   Manual Therapy  Manual Lymphatic Drainage (MLD)    Manual Lymphatic Drainage (MLD)  In Supine: Short neck, 5 diaphragmatic breaths, Rt axillary and Lt inguinal nodes, then anterior inter-axillary and Lt axillo-inguinal anastomosis,  then Lt UE from dorsal hand to lateral shoulder working from proximal to distal then retracing all steps reviewing with pt throughout and having her return demonstrations.             PT Education - 04/17/17 1026    Education provided  Yes    Education Details  Supine scapular series with yellow theraband    Person(s) Educated  Patient    Methods  Explanation;Demonstration;Handout    Comprehension  Verbalized understanding;Returned demonstration          PT Long Term Goals - 04/03/17 1118      PT LONG TERM GOAL #1   Title  Pt will be independent in self MLD to manage her left UE and lateral trunk swelling    Time  4    Period  Weeks    Status  New    Target Date  05/01/17      PT LONG TERM GOAL #2   Title  Pt will be independent in a home exercise program for continued stretching and strengthening     Time  4    Period  Weeks    Status  New    Target Date  05/01/17      PT LONG TERM GOAL #3   Title  Pt will report no pain or tenderness in area of left breast where increased tightness can be palpated to allow improved comfort    Time  4    Period  Weeks    Status  New    Target Date  05/01/17      PT LONG TERM GOAL #4   Title  Pt will receive appropriate compression garments for long term management of lymphedema    Time  4    Period  Weeks    Status  New    Target Date  05/01/17            Plan - 04/17/17 1028    Clinical Impression Statement  Reviewed with pt self manual lymph drainage (MLD) today while and after performing, having her  return demonstrations. She was pushing into tissue instead of stretching the skin. After tactile and VCs pt was able to return correct demonstration and verbalizeunderstanding difference. She plans to bring her husband to session tomorrow morning so we can further instruct him as well. Overall pt reports noticing her Lt UE swelling has been improved since last week. Issued script to her with explaination of flat knit  garments and that she should fit into an off the shelf garment, also issued phone number instructions to call and make an appointment at [facility] to get measured, Progressed HEP to include supine scapular series as well whihc she tolerated very well and reported feeling the benefit of.     Rehab Potential  Excellent    PT Frequency  2x / week    PT Duration  4 weeks    PT Treatment/Interventions  ADLs/Self Care Home Management;Therapeutic exercise;Therapeutic activities;Scar mobilization;Passive range of motion;Manual techniques;Manual lymph drainage;Compression bandaging;Taping;Vasopneumatic Device;Patient/family education    PT Next Visit Plan  Review self MLD for Lt UE and left lateral trunk instructing husband as well, pt may decrease to 1x/wk once she is indep with MLD, review supine scap, instruct in lymphedema risk reduction practices.    PT Home Exercise Plan  Self MLD and supine scapular series    Consulted and Agree with Plan of Care  Patient       Patient will benefit from skilled therapeutic intervention in order to improve the following deficits and impairments:  Pain, Postural dysfunction, Decreased strength, Increased edema  Visit Diagnosis: Postmastectomy lymphedema  Muscle weakness (generalized)  Abnormal posture     Problem List Patient Active Problem List   Diagnosis Date Noted  . Arthritis of right knee 01/29/2017  . Baker's cyst of knee, left 01/02/2017  . Perennial allergic rhinitis with a probable nonallergic component 12/12/2016  . Neuropathy  11/19/2016  . Chronic pain 11/19/2016  . Moderate persistent asthma 07/10/2016  . Bronchitis 07/10/2016  . Chemotherapy induced diarrhea 10/17/2015  . Osteoporosis 09/07/2015  . Generalized anxiety disorder 09/07/2015  . Osteoarthritis of right knee 09/07/2015  . GERD (gastroesophageal reflux disease) 09/07/2015  . Depression 09/07/2015  . Irritability 09/07/2015  . Acquired absence of left breast and nipple 06/30/2015  . Breast cancer of upper-inner quadrant of left female breast ([place]) 05/26/2015    Otelia Limes, PTA 04/17/2017, 10:38 AM  [place], [place], [address] Phone: [phone]   Fax:  [phone]  Name: Heidi Walls MRN: [id number] Date of Birth: 03/28/1955

## 2017-04-17 NOTE — Patient Instructions (Signed)

## 2017-04-18 ENCOUNTER — Ambulatory Visit: Payer: BLUE CROSS/BLUE SHIELD

## 2017-04-18 DIAGNOSIS — R293 Abnormal posture: Secondary | ICD-10-CM | POA: Diagnosis not present

## 2017-04-18 DIAGNOSIS — I972 Postmastectomy lymphedema syndrome: Secondary | ICD-10-CM

## 2017-04-18 DIAGNOSIS — M6281 Muscle weakness (generalized): Secondary | ICD-10-CM | POA: Diagnosis not present

## 2017-04-18 NOTE — Therapy (Signed)
[place], [place], [address] Phone: [phone]   Fax:  [phone]  Physical Therapy Treatment  Patient Details  Name: Heidi Walls MRN: [id number] Date of Birth: Aug 26, 1955 Referring Provider: Lindi Adie   Encounter Date: 04/18/2017  PT End of Session - 04/18/17 0844    Visit Number  4    Number of Visits  9    Date for PT Re-Evaluation  05/01/17    PT Start Time  0802    PT Stop Time  0844    PT Time Calculation (min)  42 min    Activity Tolerance  Patient tolerated treatment well    Behavior During Therapy  [facility] for tasks assessed/performed       Past Medical History:  Diagnosis Date  . Anemia   . Anxiety    OCD  . Arthritis    knees and hips  . Asthma   . Breast cancer ([place])   . Breast cancer of upper-inner quadrant of left female breast ([place]) 05/26/2015  . GERD (gastroesophageal reflux disease)   . Hypertension   . Insomnia     Past Surgical History:  Procedure Laterality Date  . BREAST RECONSTRUCTION WITH PLACEMENT OF TISSUE EXPANDER AND FLEX HD (ACELLULAR HYDRATED DERMIS) Left 06/24/2015   Procedure: LEFT BREAST RECONSTRUCTION WITH PLACEMENT OF TISSUE EXPANDER AND  ACELLULAR HYDRATED DERMIS (CORTIVA);  Surgeon: Irene Limbo, MD;  Location: [place];  Service: Plastics;  Laterality: Left;  . KNEE SURGERY Right 2000   arthroscopy  . LIPOSUCTION WITH LIPOFILLING Left 12/23/2015   Procedure: LIPOFILLING TO LEFT CHEST FROM ABDOMEN  ;  Surgeon: Irene Limbo, MD;  Location: [place];  Service: Plastics;  Laterality: Left;  Marland Kitchen MASTECTOMY W/ SENTINEL NODE BIOPSY Left 06/24/2015   Procedure: LEFT TOTAL MASTECTOMY WITH SENTINEL LYMPH NODE BIOPSY;  Surgeon: Excell Seltzer, MD;  Location: [place];  Service: General;  Laterality: Left;  Marland Kitchen MASTOPEXY Right 12/23/2015   Procedure: RIGHT BREAST MASTOPEXY;  Surgeon: Irene Limbo, MD;   Location: [place];  Service: Plastics;  Laterality: Right;  . MICROLARYNGOSCOPY W/VOCAL CORD INJECTION N/A 10/15/2016   Procedure: SUSPENDED DIRECT MICROLARYNGOSCOPY WITH VOCAL CORD INJECTION WITH JET VENTILATION;  Surgeon: Melida Quitter, MD;  Location: [place];  Service: ENT;  Laterality: N/A;  . PORTACATH PLACEMENT Right 06/24/2015   Procedure: INSERTION PORT-A-CATH;  Surgeon: Excell Seltzer, MD;  Location: [facility];  Service: General;  Laterality: Right;  . portacath removal  09/05/2016  . REMOVAL OF BILATERAL TISSUE EXPANDERS WITH PLACEMENT OF BILATERAL BREAST IMPLANTS Left 12/23/2015   Procedure: REMOVAL OF LEFT  TISSUE EXPANDERS WITH PLACEMENT OF LEFT  BREAST IMPLANTS;  Surgeon: Irene Limbo, MD;  Location: [place];  Service: Plastics;  Laterality: Left;  . TONSILLECTOMY      There were no vitals filed for this visit.  Subjective Assessment - 04/18/17 0804    Subjective  I brought my husband so he can learn the massage as well. I had alot of leg cramps last night, not sure what causes that.     Pertinent History  left breast cancer, hx of mastectomy 06/24/15 and SLNB, osteoporosis, neuropathy, asthma, decreased vision due to chemo, completed chemo in July 2018, pt did not require radiation, Bakers cyst    Patient Stated Goals  to get my swelling to go away    Currently in Pain?  No/denies  [facility] Adult PT Treatment/Exercise - 04/18/17 0001      Manual Therapy   Manual Therapy  Manual Lymphatic Drainage (MLD)    Manual Lymphatic Drainage (MLD)  In Supine: Short neck, 5 diaphragmatic breaths, Rt axillary and Lt inguinal nodes, then anterior inter-axillary and Lt axillo-inguinal anastomosis,  then Lt UE from dorsal hand to lateral shoulder working from proximal to distal then retracing all steps reviewing with pt throughout and having her return demonstrations and instructing husband in this as well  and having him return demonstration.             PT Education - 04/17/17 1026    Education provided  Yes    Education Details  Supine scapular series with yellow theraband    Person(s) Educated  Patient    Methods  Explanation;Demonstration;Handout    Comprehension  Verbalized understanding;Returned demonstration          PT Long Term Goals - 04/03/17 1118      PT LONG TERM GOAL #1   Title  Pt will be independent in self MLD to manage her left UE and lateral trunk swelling    Time  4    Period  Weeks    Status  New    Target Date  05/01/17      PT LONG TERM GOAL #2   Title  Pt will be independent in a home exercise program for continued stretching and strengthening     Time  4    Period  Weeks    Status  New    Target Date  05/01/17      PT LONG TERM GOAL #3   Title  Pt will report no pain or tenderness in area of left breast where increased tightness can be palpated to allow improved comfort    Time  4    Period  Weeks    Status  New    Target Date  05/01/17      PT LONG TERM GOAL #4   Title  Pt will receive appropriate compression garments for long term management of lymphedema    Time  4    Period  Weeks    Status  New    Target Date  05/01/17            Plan - 04/18/17 0844    Clinical Impression Statement  Pts husband, Louie Casa, came today with pt for appointment so was able to instruct him as well in manual lymph drainage. He returned very good demonstration only requiring min tactile hand over hand cuing and VCs for lighter pressure and not to slide in skin. No redness on pts tissue noticed showing he was using appropriate pressure and educated him as well to assess her skin while he's working on her. If he sees any redness his pressure is too firm, he verbalized understanding all this. Pt reported her Lt upper arm has felt reduced in swelling since yesterdays visit.    Rehab Potential  Excellent    PT Frequency  2x / week    PT Duration  4 weeks     PT Treatment/Interventions  ADLs/Self Care Home Management;Therapeutic exercise;Therapeutic activities;Scar mobilization;Passive range of motion;Manual techniques;Manual lymph drainage;Compression bandaging;Taping;Vasopneumatic Device;Patient/family education    PT Next Visit Plan  Review self MLD for Lt UE and left lateral trunk, pt may decrease to 1x/wk once she is indep with MLD, review supine scap, instruct in lymphedema risk reduction practices.    Consulted and Agree with  Plan of Care  Patient       Patient will benefit from skilled therapeutic intervention in order to improve the following deficits and impairments:  Pain, Postural dysfunction, Decreased strength, Increased edema  Visit Diagnosis: Postmastectomy lymphedema     Problem List Patient Active Problem List   Diagnosis Date Noted  . Arthritis of right knee 01/29/2017  . Baker's cyst of knee, left 01/02/2017  . Perennial allergic rhinitis with a probable nonallergic component 12/12/2016  . Neuropathy 11/19/2016  . Chronic pain 11/19/2016  . Moderate persistent asthma 07/10/2016  . Bronchitis 07/10/2016  . Chemotherapy induced diarrhea 10/17/2015  . Osteoporosis 09/07/2015  . Generalized anxiety disorder 09/07/2015  . Osteoarthritis of right knee 09/07/2015  . GERD (gastroesophageal reflux disease) 09/07/2015  . Depression 09/07/2015  . Irritability 09/07/2015  . Acquired absence of left breast and nipple 06/30/2015  . Breast cancer of upper-inner quadrant of left female breast ([place]) 05/26/2015    Otelia Limes, PTA 04/18/2017, 8:47 AM  [place], [place], [address] Phone: [phone]   Fax:  [phone]  Name: ERIANA SULIMAN MRN: [id number] Date of Birth: 27-Sep-1955

## 2017-04-24 ENCOUNTER — Other Ambulatory Visit: Payer: Self-pay | Admitting: Family Medicine

## 2017-04-27 ENCOUNTER — Other Ambulatory Visit: Payer: Self-pay | Admitting: Physician Assistant

## 2017-04-30 ENCOUNTER — Ambulatory Visit: Payer: BLUE CROSS/BLUE SHIELD

## 2017-04-30 DIAGNOSIS — R293 Abnormal posture: Secondary | ICD-10-CM

## 2017-04-30 DIAGNOSIS — I972 Postmastectomy lymphedema syndrome: Secondary | ICD-10-CM

## 2017-04-30 DIAGNOSIS — M6281 Muscle weakness (generalized): Secondary | ICD-10-CM | POA: Diagnosis not present

## 2017-04-30 NOTE — Patient Instructions (Signed)
CHEST: Doorway, Bilateral - Standing    Standing in doorway with one foot in front of other, place hands on wall with elbows bent at shoulder height. Lean forward. Hold _20-30__ seconds. _3__ reps per set, _2-3__ sets per day.  Cancer Rehab [phone]

## 2017-04-30 NOTE — Therapy (Addendum)
[place], [place], [address] Phone: [phone]   Fax:  [phone]  Physical Therapy Treatment  Patient Details  Name: REMMINGTON TETERS MRN: [id number] Date of Birth: [birth year] Referring Provider: Lindi Adie   Encounter Date: 04/30/2017  PT End of Session - 04/30/17 0908    Visit Number  5    Number of Visits  9    Date for PT Re-Evaluation  05/01/17    PT Start Time  0802    PT Stop Time  0852    PT Time Calculation (min)  50 min    Activity Tolerance  Patient tolerated treatment well    Behavior During Therapy  [facility] for tasks assessed/performed       Past Medical History:  Diagnosis Date  . Anemia   . Anxiety    OCD  . Arthritis    knees and hips  . Asthma   . Breast cancer ([place])   . Breast cancer of upper-inner quadrant of left female breast ([place]) 05/26/2015  . GERD (gastroesophageal reflux disease)   . Hypertension   . Insomnia     Past Surgical History:  Procedure Laterality Date  . BREAST RECONSTRUCTION WITH PLACEMENT OF TISSUE EXPANDER AND FLEX HD (ACELLULAR HYDRATED DERMIS) Left 06/24/2015   Procedure: LEFT BREAST RECONSTRUCTION WITH PLACEMENT OF TISSUE EXPANDER AND  ACELLULAR HYDRATED DERMIS (CORTIVA);  Surgeon: Irene Limbo, MD;  Location: [place];  Service: Plastics;  Laterality: Left;  . KNEE SURGERY Right 2000   arthroscopy  . LIPOSUCTION WITH LIPOFILLING Left 12/23/2015   Procedure: LIPOFILLING TO LEFT CHEST FROM ABDOMEN  ;  Surgeon: Irene Limbo, MD;  Location: [place];  Service: Plastics;  Laterality: Left;  Marland Kitchen MASTECTOMY W/ SENTINEL NODE BIOPSY Left 06/24/2015   Procedure: LEFT TOTAL MASTECTOMY WITH SENTINEL LYMPH NODE BIOPSY;  Surgeon: Excell Seltzer, MD;  Location: [place];  Service: General;  Laterality: Left;  Marland Kitchen MASTOPEXY Right 12/23/2015   Procedure: RIGHT BREAST MASTOPEXY;  Surgeon: Irene Limbo, MD;   Location: [place];  Service: Plastics;  Laterality: Right;  . MICROLARYNGOSCOPY W/VOCAL CORD INJECTION N/A 10/15/2016   Procedure: SUSPENDED DIRECT MICROLARYNGOSCOPY WITH VOCAL CORD INJECTION WITH JET VENTILATION;  Surgeon: Melida Quitter, MD;  Location: [place];  Service: ENT;  Laterality: N/A;  . PORTACATH PLACEMENT Right 06/24/2015   Procedure: INSERTION PORT-A-CATH;  Surgeon: Excell Seltzer, MD;  Location: [place];  Service: General;  Laterality: Right;  . portacath removal  09/05/2016  . REMOVAL OF BILATERAL TISSUE EXPANDERS WITH PLACEMENT OF BILATERAL BREAST IMPLANTS Left 12/23/2015   Procedure: REMOVAL OF LEFT  TISSUE EXPANDERS WITH PLACEMENT OF LEFT  BREAST IMPLANTS;  Surgeon: Irene Limbo, MD;  Location: [place];  Service: Plastics;  Laterality: Left;  . TONSILLECTOMY      There were no vitals filed for this visit.  Subjective Assessment - 04/30/17 0806    Subjective  My husband and I have been doing the massage about 3-4x/week and I can tell a huge improvement in my swelling. I've also stopped sleeping on my Lt side and I think that's been helping too. I get measured for my compression bra nd sleeve tomorrow.  Overall I am very pleased.     Pertinent History  left breast cancer, hx of mastectomy 06/24/15 and SLNB, osteoporosis, neuropathy, asthma, decreased vision due to chemo, completed chemo in July 2018, pt did not require radiation, Bakers cyst  Patient Stated Goals  to get my swelling to go away    Currently in Pain?  No/denies                       [facility] Adult PT Treatment/Exercise - 04/30/17 0001      Self-Care   Self-Care  Other Self-Care Comments    Other Self-Care Comments   Lymphedema risk reduction practices and infection prevention answering pts questions throughout      Shoulder Exercises: Supine   Horizontal ABduction  Strengthening;Both;10 reps;Theraband    Theraband Level (Shoulder  Horizontal ABduction)  Level 2 (Red)    External Rotation  Strengthening;Both;10 reps;Theraband    Theraband Level (Shoulder External Rotation)  Level 2 (Red)    Flexion  Strengthening;Both;10 reps;Theraband Narrow and Wide Grip, 10 times each    Theraband Level (Shoulder Flexion)  Level 2 (Red)    Diagonals  Strengthening;Right;Left;10 reps;Theraband D2    Theraband Level (Shoulder Diagonals)  Level 2 (Red)    Other Supine Exercises  Minor VCs required for all above exercises for correct technique and to perform these slowly.      Shoulder Exercises: Stretch   Corner Stretch  3 reps;10 seconds In doorway      Manual Therapy   Manual Therapy  Manual Lymphatic Drainage (MLD)    Manual Lymphatic Drainage (MLD)  In Supine: Short neck, 5 diaphragmatic breaths, Rt axillary and Lt inguinal nodes, then anterior inter-axillary and Lt axillo-inguinal anastomosis,  then Lt UE from dorsal hand to lateral shoulder working from proximal to distal then retracing all steps reviewing with pt throughout and having her return demonstrations prn.              PT Education - 04/30/17 0859    Education provided  Yes    Education Details  Reviewed supine scapular series and instructed pt in doorway stretch; also lymphedema risk reduction and infection prevention practices    Person(s) Educated  Patient    Methods  Demonstration;Explanation;Handout    Comprehension  Verbalized understanding;Returned demonstration          PT Long Term Goals - 04/30/17 0845      PT LONG TERM GOAL #1   Title  Pt will be independent in self MLD to manage her left UE and lateral trunk swelling    Baseline  Pt and husband and performing this together 3-4x/week-04/30/17    Status  Achieved      PT LONG TERM GOAL #2   Title  Pt will be independent in a home exercise program for continued stretching and strengthening     Status  Achieved      PT LONG TERM GOAL #3   Title  Pt will report no pain or tenderness in area  of left breast where increased tightness can be palpated to allow improved comfort    Baseline  Pt reports only numbness now where discomfort and sharp pain used to be with increased swelling, now reports no problems-04/30/17    Status  Achieved      PT LONG TERM GOAL #4   Title  Pt will receive appropriate compression garments for long term management of lymphedema    Baseline  Pt gets measured for these tomorrow-04/30/17    Status  Achieved            Plan - 04/30/17 0908    Clinical Impression Statement  Reviewed supine scapular series with pt and issued red theraband to progress  to at home. She returned very good demonstration only requiring minor VCs for slower pace and UE positioning for technique. Overall she is doing these very well. Pt is independent with self manual lymph drainage as weel as her husband and pt reprots they have been doing this 3-4x/week and she is very pleased with the reduction on swelling at her Lt upper trunk and upper arm. She gets measured for compression bra and sleeve ([facility] [place]) tomorrow. Overall pt has done very well with this episode of care, has met all goals and is ready for D/C at this time.     Rehab Potential  Excellent    PT Frequency  2x / week    PT Duration  4 weeks    PT Treatment/Interventions  ADLs/Self Care Home Management;Therapeutic exercise;Therapeutic activities;Scar mobilization;Passive range of motion;Manual techniques;Manual lymph drainage;Compression bandaging;Taping;Vasopneumatic Device;Patient/family education    PT Next Visit Plan  D/C this visit.     PT Home Exercise Plan  Self MLD and supine scapular series and doorway pectoralis stretch     Consulted and Agree with Plan of Care  Patient       Patient will benefit from skilled therapeutic intervention in order to improve the following deficits and impairments:  Pain, Postural dysfunction, Decreased strength, Increased edema  Visit  Diagnosis: Postmastectomy lymphedema  Muscle weakness (generalized)  Abnormal posture     Problem List Patient Active Problem List   Diagnosis Date Noted  . Arthritis of right knee 01/29/2017  . Baker's cyst of knee, left 01/02/2017  . Perennial allergic rhinitis with a probable nonallergic component 12/12/2016  . Neuropathy 11/19/2016  . Chronic pain 11/19/2016  . Moderate persistent asthma 07/10/2016  . Bronchitis 07/10/2016  . Chemotherapy induced diarrhea 10/17/2015  . Osteoporosis 09/07/2015  . Generalized anxiety disorder 09/07/2015  . Osteoarthritis of right knee 09/07/2015  . GERD (gastroesophageal reflux disease) 09/07/2015  . Depression 09/07/2015  . Irritability 09/07/2015  . Acquired absence of left breast and nipple 06/30/2015  . Breast cancer of upper-inner quadrant of left female breast ([place]) 05/26/2015    Otelia Limes, PTA 04/30/2017, 9:15 AM  [place], [place], [address] Phone: [phone]   Fax:  [phone]  Name: MORIA BROPHY MRN: [id number] Date of Birth: December 12, 1955  PHYSICAL THERAPY DISCHARGE SUMMARY  Visits from Start of Care: 4  Current functional level related to goals / functional outcomes: See above   Remaining deficits: See above   Education / Equipment: See above  Plan: Patient agrees to discharge.  Patient goals were met. Patient is being discharged due to meeting the stated rehab goals.  ?????    Allyson Sabal [place], [place] 05/01/17 3:35 PM

## 2017-05-01 DIAGNOSIS — C50912 Malignant neoplasm of unspecified site of left female breast: Secondary | ICD-10-CM | POA: Diagnosis not present

## 2017-05-02 ENCOUNTER — Other Ambulatory Visit: Payer: Self-pay | Admitting: Allergy and Immunology

## 2017-05-02 DIAGNOSIS — J4540 Moderate persistent asthma, uncomplicated: Secondary | ICD-10-CM

## 2017-05-07 ENCOUNTER — Encounter: Payer: Self-pay | Admitting: Physician Assistant

## 2017-05-07 ENCOUNTER — Ambulatory Visit: Payer: BLUE CROSS/BLUE SHIELD | Admitting: Physician Assistant

## 2017-05-07 VITALS — BP 164/80 | HR 81 | Temp 98.2°F | Ht 63.0 in | Wt 160.6 lb

## 2017-05-07 DIAGNOSIS — F411 Generalized anxiety disorder: Secondary | ICD-10-CM | POA: Diagnosis not present

## 2017-05-07 DIAGNOSIS — Z170 Estrogen receptor positive status [ER+]: Secondary | ICD-10-CM | POA: Diagnosis not present

## 2017-05-07 DIAGNOSIS — C50212 Malignant neoplasm of upper-inner quadrant of left female breast: Secondary | ICD-10-CM

## 2017-05-07 DIAGNOSIS — G629 Polyneuropathy, unspecified: Secondary | ICD-10-CM | POA: Diagnosis not present

## 2017-05-07 DIAGNOSIS — F331 Major depressive disorder, recurrent, moderate: Secondary | ICD-10-CM | POA: Diagnosis not present

## 2017-05-07 MED ORDER — HYDROCODONE-ACETAMINOPHEN 10-325 MG PO TABS: 1.0000 | ORAL_TABLET | ORAL | 0 refills | Status: DC | PRN

## 2017-05-07 MED ORDER — ALPRAZOLAM 0.5 MG PO TABS: 0.5000 mg | ORAL_TABLET | Freq: Three times a day (TID) | ORAL | 5 refills | Status: DC | PRN

## 2017-05-07 MED ORDER — DULOXETINE HCL 60 MG PO CPEP: 60.0000 mg | ORAL_CAPSULE | Freq: Every day | ORAL | 3 refills | Status: DC

## 2017-05-07 NOTE — Progress Notes (Signed)
BP (!) 164/80   Pulse 81   Temp 98.2 F (36.8 C) (Oral)   Ht 5\' 3"  (1.6 m)   Wt 160 lb 9.6 oz (72.8 kg)   BMI 28.45 kg/m    Subjective:    Patient ID: Heidi Walls, female    DOB: [birth year], 62 y.o.   MRN: [id number]  HPI: Heidi Walls is a 62 y.o. female presenting on 05/07/2017 for Follow-up (3 month ); Flank Pain (left ); and Oral Swelling   The patient comes in for recheck on her conditions.  Overall she is still feeling quite depressed and anxious.  There is a lot of stress going on related to her health and finances because of cancer.  She still has a lot of neuropathy.  She did have a fall several weeks ago where she hit the side of the chest and the ribs have continued to hurt.  She also hit herself the left arm.  This caused her lymphedema to be worse.  She is having to wear her sleeve and with a lymphedema bra or regularly.  Her joints still hurt a lot.  She is not wanting to have knee replacement at this time.  It is a financial burden.  However it is truly causing a lot of difficulty in working.  She has not been able to afford the physical therapy for the lymphedema because her co-pay is $100 each time.  Therapist did give her exercises to continue to do at home when she is trying to do those as right she can.  She is very exhausted when she comes home.  She has become quite debilitated from her normal self.   Past Medical History:  Diagnosis Date  . Anemia   . Anxiety    OCD  . Arthritis    knees and hips  . Asthma   . Breast cancer ([place])   . Breast cancer of upper-inner quadrant of left female breast ([place]) 05/26/2015  . GERD (gastroesophageal reflux disease)   . Hypertension   . Insomnia    Relevant past medical, surgical, family and social history reviewed and updated as indicated. Interim medical history since our last visit reviewed. Allergies and medications reviewed and updated. DATA REVIEWED: CHART IN EPIC  Family History reviewed for pertinent  findings.  Review of Systems  Constitutional: Positive for activity change, chills and fatigue. Negative for fever.  HENT: Negative.   Eyes: Negative.   Respiratory: Negative.  Negative for cough, shortness of breath and wheezing.   Cardiovascular: Negative.  Negative for chest pain, palpitations and leg swelling.  Gastrointestinal: Negative.  Negative for abdominal pain.  Endocrine: Negative.   Genitourinary: Negative.  Negative for dysuria.  Musculoskeletal: Positive for arthralgias, joint swelling and myalgias.  Skin: Negative.   Neurological: Positive for weakness.  Psychiatric/Behavioral: Positive for decreased concentration and sleep disturbance. The patient is nervous/anxious.     Allergies as of 05/07/2017      Reactions   Codeine Shortness Of Breath   Pt reports this was 30 years ago. Pt reports this was 30 years ago.   Lisinopril Cough      Medication List        Accurate as of 05/07/17 11:09 PM. Always use your most recent med list.          acetaminophen 325 MG tablet Commonly known as:  TYLENOL Take 650 mg by mouth daily.   Acidophilus Tabs Take by mouth.   albuterol 108 (90 Base)  MCG/ACT inhaler Commonly known as:  PROAIR HFA Inhale 2 puffs into the lungs every 4 (four) hours as needed for wheezing or shortness of breath.   alendronate 70 MG tablet Commonly known as:  FOSAMAX TAKE 1 TABLET BY MOUTH ONCE WEEKLY WITH A FULL GLASS OF WATER ON AN EMPTY STOMACH   ALPRAZolam 0.5 MG tablet Commonly known as:  XANAX Take 1 tablet (0.5 mg total) by mouth 3 (three) times daily as needed.   Azelastine HCl 0.15 % Soln One spray each nostril 1-2 times a day as needed   budesonide-formoterol 160-4.5 MCG/ACT inhaler Commonly known as:  SYMBICORT TWO PUFFS WITH SPACER TWICE A DAY TO PREVENT COUGH OR WHEEZE.   CALCIUM 600 + D PO Take 1 tablet by mouth daily.   Carbinoxamine Maleate 4 MG Tabs 1 tablet every 8 hours as needed for runny nose or itching.     diclofenac 75 MG EC tablet Commonly known as:  VOLTAREN Take 1 tablet (75 mg total) by mouth 2 (two) times daily.   DULoxetine 60 MG capsule Commonly known as:  CYMBALTA Take 1 capsule (60 mg total) by mouth daily.   esomeprazole 20 MG capsule Commonly known as:  NEXIUM Take 20 mg by mouth daily.   FERROUS SULFATE PO Take 1 tablet by mouth daily.   Fish Oil 1200 MG Caps Take 1,200 mg by mouth daily.   fluconazole 100 MG tablet Commonly known as:  DIFLUCAN TAKE 2 TABLETS BY MOUTH IMMEDIATELY, THEN TAKE 1 TABLET PRN   FOLIC ACID PO Take 1 tablet by mouth daily.   gabapentin 300 MG capsule Commonly known as:  NEURONTIN TAKE 1-3 CAPSULES BY MOUTH 3 TIMES DAILY.   hydrochlorothiazide 25 MG tablet Commonly known as:  HYDRODIURIL Take 25 mg by mouth daily.   HYDROcodone-acetaminophen 10-325 MG tablet Commonly known as:  NORCO Take 1 tablet by mouth every 4 (four) hours as needed.   letrozole 2.5 MG tablet Commonly known as:  FEMARA Take 1 tablet (2.5 mg total) by mouth daily.   meloxicam 7.5 MG tablet Commonly known as:  MOBIC TAKE 1 TABLET (7.5 MG TOTAL) DAILY BY MOUTH.   multivitamin tablet Take 1 tablet by mouth daily.   nystatin 100000 UNIT/ML suspension Commonly known as:  MYCOSTATIN Take 5 mLs (500,000 Units total) by mouth 4 (four) times daily.   olmesartan-hydrochlorothiazide 20-12.5 MG tablet Commonly known as:  BENICAR HCT TAKE 1 TABLET BY MOUTH EVERY DAY   olopatadine 0.1 % ophthalmic solution Commonly known as:  PATANOL Place 1 drop into both eyes 2 (two) times daily.   ranitidine 150 MG tablet Commonly known as:  ZANTAC One tablet twice daily as needed.   SUPER B COMPLEX PO Take 1 tablet by mouth daily.   traMADol 50 MG tablet Commonly known as:  ULTRAM TAKE 1 TABLET BY MOUTH EVERY 6 HOURS AS NEEDED   valACYclovir 1000 MG tablet Commonly known as:  VALTREX Take 1 tablet (1,000 mg total) by mouth 2 (two) times daily.   vitamin C 1000  MG tablet Take 1,000 mg by mouth daily.          Objective:    BP (!) 164/80   Pulse 81   Temp 98.2 F (36.8 C) (Oral)   Ht 5\' 3"  (1.6 m)   Wt 160 lb 9.6 oz (72.8 kg)   BMI 28.45 kg/m   Allergies  Allergen Reactions  . Codeine Shortness Of Breath    Pt reports this was 30 years  ago. Pt reports this was 30 years ago.  Marland Kitchen Lisinopril Cough    Wt Readings from Last 3 Encounters:  05/07/17 160 lb 9.6 oz (72.8 kg)  02/11/17 167 lb 3.2 oz (75.8 kg)  01/29/17 170 lb 3.2 oz (77.2 kg)    Physical Exam  Constitutional: She is oriented to person, place, and time. She appears well-developed and well-nourished.  HENT:  Head: Normocephalic and atraumatic.  Right Ear: Tympanic membrane, external ear and ear canal normal.  Left Ear: Tympanic membrane, external ear and ear canal normal.  Nose: Nose normal. No rhinorrhea.  Mouth/Throat: Oropharynx is clear and moist and mucous membranes are normal. No oropharyngeal exudate or posterior oropharyngeal erythema.  Eyes: Pupils are equal, round, and reactive to light. Conjunctivae and EOM are normal.  Neck: Normal range of motion. Neck supple.  Cardiovascular: Normal rate, regular rhythm, normal heart sounds and intact distal pulses.  Pulmonary/Chest: Effort normal and breath sounds normal.  Abdominal: Soft. Bowel sounds are normal.  Neurological: She is alert and oriented to person, place, and time. She has normal reflexes.  Skin: Skin is warm and dry. No rash noted.  Psychiatric: She has a normal mood and affect. Her behavior is normal. Judgment and thought content normal.        Assessment & Plan:   1. Malignant neoplasm of upper-inner quadrant of left breast in female, estrogen receptor positive ([place])  2. Generalized anxiety disorder - ALPRAZolam (XANAX) 0.5 MG tablet; Take 1 tablet (0.5 mg total) by mouth 3 (three) times daily as needed.  Dispense: 90 tablet; Refill: 5  3. Neuropathy - HYDROcodone-acetaminophen (NORCO) 10-325 MG  tablet; Take 1 tablet by mouth every 4 (four) hours as needed.  Dispense: 40 tablet; Refill: 0 - DULoxetine (CYMBALTA) 60 MG capsule; Take 1 capsule (60 mg total) by mouth daily.  Dispense: 30 capsule; Refill: 3  4. Moderate episode of recurrent major depressive disorder (HCC) - DULoxetine (CYMBALTA) 60 MG capsule; Take 1 capsule (60 mg total) by mouth daily.  Dispense: 30 capsule; Refill: 3   Continue all other maintenance medications as listed above.  Follow up plan: Return in about 2 months (around 07/07/2017) for recheck.  Educational handout given for [place] PA-C [facility] [address]  [place], [place] [address] [phone]   05/07/2017, 11:09 PM

## 2017-05-07 NOTE — Patient Instructions (Signed)
In a few days you may receive a survey in the mail or online from [employer] regarding your visit with us today. Please take a moment to fill this out. Your feedback is very important to our whole office. It can help us better understand your needs as well as improve your experience and satisfaction. Thank you for taking your time to complete it. We care about you.  Whitten Andreoni, PA-C  

## 2017-05-08 ENCOUNTER — Encounter: Payer: BLUE CROSS/BLUE SHIELD | Admitting: Physical Therapy

## 2017-05-10 ENCOUNTER — Telehealth: Payer: Self-pay | Admitting: Hematology and Oncology

## 2017-05-13 ENCOUNTER — Inpatient Hospital Stay: Payer: BLUE CROSS/BLUE SHIELD

## 2017-05-13 ENCOUNTER — Inpatient Hospital Stay: Payer: BLUE CROSS/BLUE SHIELD | Attending: Hematology and Oncology | Admitting: Hematology and Oncology

## 2017-05-13 ENCOUNTER — Telehealth: Payer: Self-pay | Admitting: Hematology and Oncology

## 2017-05-13 VITALS — BP 153/67 | HR 83 | Temp 97.9°F | Resp 18 | Ht 63.0 in | Wt 163.2 lb

## 2017-05-13 DIAGNOSIS — R413 Other amnesia: Secondary | ICD-10-CM | POA: Diagnosis not present

## 2017-05-13 DIAGNOSIS — M797 Fibromyalgia: Secondary | ICD-10-CM | POA: Insufficient documentation

## 2017-05-13 DIAGNOSIS — Z79811 Long term (current) use of aromatase inhibitors: Secondary | ICD-10-CM | POA: Insufficient documentation

## 2017-05-13 DIAGNOSIS — C50212 Malignant neoplasm of upper-inner quadrant of left female breast: Secondary | ICD-10-CM

## 2017-05-13 DIAGNOSIS — M8580 Other specified disorders of bone density and structure, unspecified site: Secondary | ICD-10-CM | POA: Diagnosis not present

## 2017-05-13 DIAGNOSIS — N951 Menopausal and female climacteric states: Secondary | ICD-10-CM | POA: Diagnosis not present

## 2017-05-13 DIAGNOSIS — Z170 Estrogen receptor positive status [ER+]: Secondary | ICD-10-CM | POA: Diagnosis not present

## 2017-05-13 DIAGNOSIS — Z9012 Acquired absence of left breast and nipple: Secondary | ICD-10-CM | POA: Diagnosis not present

## 2017-05-13 DIAGNOSIS — Z9221 Personal history of antineoplastic chemotherapy: Secondary | ICD-10-CM | POA: Diagnosis not present

## 2017-05-13 DIAGNOSIS — F419 Anxiety disorder, unspecified: Secondary | ICD-10-CM | POA: Diagnosis not present

## 2017-05-13 DIAGNOSIS — F413 Other mixed anxiety disorders: Secondary | ICD-10-CM | POA: Diagnosis not present

## 2017-05-13 DIAGNOSIS — G629 Polyneuropathy, unspecified: Secondary | ICD-10-CM

## 2017-05-13 LAB — CBC WITH DIFFERENTIAL (CANCER CENTER ONLY)
Basophils Absolute: 0 10*3/uL (ref 0.0–0.1)
Basophils Relative: 1 %
EOS ABS: 0.1 10*3/uL (ref 0.0–0.5)
EOS PCT: 2 %
HCT: 30.3 % — ABNORMAL LOW (ref 34.8–46.6)
Hemoglobin: 9.9 g/dL — ABNORMAL LOW (ref 11.6–15.9)
Lymphocytes Relative: 24 %
Lymphs Abs: 1.5 10*3/uL (ref 0.9–3.3)
MCH: 28.9 pg (ref 25.1–34.0)
MCHC: 32.8 g/dL (ref 31.5–36.0)
MCV: 88.2 fL (ref 79.5–101.0)
MONOS PCT: 7 %
Monocytes Absolute: 0.4 10*3/uL (ref 0.1–0.9)
Neutro Abs: 4.2 10*3/uL (ref 1.5–6.5)
Neutrophils Relative %: 66 %
PLATELETS: 310 10*3/uL (ref 145–400)
RBC: 3.44 MIL/uL — ABNORMAL LOW (ref 3.70–5.45)
RDW: 13.5 % (ref 11.2–14.5)
WBC Count: 6.3 10*3/uL (ref 3.9–10.3)

## 2017-05-13 NOTE — Assessment & Plan Note (Signed)
Left simple mastectomy with reconstruction 06/24/2015: IDC grade 3, 2.5 cm, with high-grade DCIS, ALH, LVI present, margins negative, 0/1 sentinel node negative, , ER 100%, PR 5%, HER-2 positive ratio 1.56 with average copy #8.25, Ki-67 20%  Pathologic stage:T2 N0 stage II a Treatment plan: 1. Adjuvant chemotherapy with TCHP X 6 completed 10/30/17followed by Herceptin maintenance for 1 year which completed 08/07/2016 2. Followed by adjuvant hormonal therapy with anastrozole started 11/28/2015 ---------------------------------------------------------------------------------------------------------- Current treatment:  Echocardiogram 05/22/16 LVEF 60-65% Anastrozole started 11/28/15  Anastrozole Toxicities:  1.  Severe hot flashes 2. muscle stiffness and achiness  Memory loss  Bone density test 04/12/2016: T score -2.2. Osteopenia. She is taking Fosamax and calcium and vitamin D.  Anxiety: On antianxiety medications  Return to clinic in 1 year for follow-up

## 2017-05-13 NOTE — Progress Notes (Signed)
Patient Care Team: Theodoro Clock as PCP - General (General Practice) Nicholas Lose, MD as Consulting Physician (Hematology and Oncology) Delice Bison, Charlestine Massed, NP as Nurse Practitioner (Hematology and Oncology) Irene Limbo, MD as Consulting Physician (Plastic Surgery) Excell Seltzer, MD as Consulting Physician (General Surgery)  DIAGNOSIS:  Encounter Diagnoses  Name Primary?  . Malignant neoplasm of upper-inner quadrant of left breast in female, estrogen receptor positive ([place]) Yes  . Neuropathy     SUMMARY OF ONCOLOGIC HISTORY:   Breast cancer of upper-inner quadrant of left female breast ([place])   05/26/2015 Initial Diagnosis    Screen detected left breast asymmetry (posterior 1.6 cm): Grade 2-3 IDC ER/PR positive HER-2 positive Ki-67 20% plus calcs (span 6.1 cm): High-grade DCIS with suspicious foci of invasion (4.2 cm apart)      06/24/2015 Surgery    Left simple mastectomy with reconstruction: IDC grade 3, 2.5 cm, with high-grade DCIS, ALH, LVI present, margins negative, 0/1 sentinel node negative, T2 N0 stage II a, ER 100%, PR 5%, HER-2 positive ratio 1.56 with average copy #8.25, Ki-67 20%      07/25/2015 - 11/03/2015 Chemotherapy    Adjuvant chemotherapy with TCH Perjeta 6 cycles followed by Herceptin maintenance for 1 year      11/28/2015 -  Anti-estrogen oral therapy    Adjuvant anastrozole 1 mg daily       CHIEF COMPLIANT: Follow-up on anastrozole therapy  INTERVAL HISTORY: Heidi Walls is a 62 year old with above-mentioned left breast cancer treated with mastectomy followed by adjuvant chemotherapy and is currently on anastrozole therapy.  She appears to be tolerating anastrozole extremely well.  Denies any fatigue or nausea vomiting.  She does have hot flashes that bother her.  She also has arthralgias and myalgias.  Denies any lumps or nodules in the breast.  She has fibromyalgia type tender spots throughout her upper and lower  extremities.  REVIEW OF SYSTEMS:   Constitutional: Denies fevers, chills or abnormal weight loss Eyes: Denies blurriness of vision Ears, nose, mouth, throat, and face: Denies mucositis or sore throat Respiratory: Denies cough, dyspnea or wheezes Cardiovascular: Denies palpitation, chest discomfort Gastrointestinal: Diarrhea Skin: Denies abnormal skin rashes Lymphatics: Denies new lymphadenopathy or easy bruising Neurological:Denies numbness, tingling or new weaknesses Behavioral/Psych: Mood is stable, no new changes  Extremities: No lower extremity edema Breast: Anxiety issues denies any pain or lumps or nodules in either breasts All other systems were reviewed with the patient and are negative.  I have reviewed the past medical history, past surgical history, social history and family history with the patient and they are unchanged from previous note.  ALLERGIES:  is allergic to codeine and lisinopril.  MEDICATIONS:  Current Outpatient Medications  Medication Sig Dispense Refill  . acetaminophen (TYLENOL) 325 MG tablet Take 650 mg by mouth daily.    Marland Kitchen albuterol (PROAIR HFA) 108 (90 Base) MCG/ACT inhaler Inhale 2 puffs into the lungs every 4 (four) hours as needed for wheezing or shortness of breath. 1 Inhaler 2  . alendronate (FOSAMAX) 70 MG tablet TAKE 1 TABLET BY MOUTH ONCE WEEKLY WITH A FULL GLASS OF WATER ON AN EMPTY STOMACH 12 tablet 2  . ALPRAZolam (XANAX) 0.5 MG tablet Take 1 tablet (0.5 mg total) by mouth 3 (three) times daily as needed. 90 tablet 5  . Ascorbic Acid (VITAMIN C) 1000 MG tablet Take 1,000 mg by mouth daily.    . Azelastine HCl 0.15 % SOLN One spray each nostril 1-2 times a day  as needed 30 mL 5  . B Complex-C (SUPER B COMPLEX PO) Take 1 tablet by mouth daily.    . budesonide-formoterol (SYMBICORT) 160-4.5 MCG/ACT inhaler TWO PUFFS WITH SPACER TWICE A DAY TO PREVENT COUGH OR WHEEZE. 10.2 g 4  . Calcium Carb-Cholecalciferol (CALCIUM 600 + D PO) Take 1 tablet by  mouth daily.    . Carbinoxamine Maleate 4 MG TABS 1 tablet every 8 hours as needed for runny nose or itching. 60 each 5  . diclofenac (VOLTAREN) 75 MG EC tablet Take 1 tablet (75 mg total) by mouth 2 (two) times daily. 60 tablet 5  . DULoxetine (CYMBALTA) 60 MG capsule Take 1 capsule (60 mg total) by mouth daily. 30 capsule 3  . esomeprazole (NEXIUM) 20 MG capsule Take 20 mg by mouth daily.     Marland Kitchen FERROUS SULFATE PO Take 1 tablet by mouth daily.    Marland Kitchen FOLIC ACID PO Take 1 tablet by mouth daily.    Marland Kitchen gabapentin (NEURONTIN) 300 MG capsule TAKE 1-3 CAPSULES BY MOUTH 3 TIMES DAILY. 270 capsule 0  . hydrochlorothiazide (HYDRODIURIL) 25 MG tablet Take 25 mg by mouth daily.  3  . HYDROcodone-acetaminophen (NORCO) 10-325 MG tablet Take 1 tablet by mouth every 4 (four) hours as needed. 40 tablet 0  . Lactobacillus (ACIDOPHILUS) TABS Take by mouth.    . letrozole (FEMARA) 2.5 MG tablet Take 1 tablet (2.5 mg total) by mouth daily. 90 tablet 3  . Multiple Vitamin (MULTIVITAMIN) tablet Take 1 tablet by mouth daily.    Marland Kitchen nystatin (MYCOSTATIN) 100000 UNIT/ML suspension Take 5 mLs (500,000 Units total) by mouth 4 (four) times daily. 60 mL 0  . olmesartan-hydrochlorothiazide (BENICAR HCT) 20-12.5 MG tablet TAKE 1 TABLET BY MOUTH EVERY DAY 30 tablet 2  . olopatadine (PATANOL) 0.1 % ophthalmic solution Place 1 drop into both eyes 2 (two) times daily. 5 mL 12  . Omega-3 Fatty Acids (FISH OIL) 1200 MG CAPS Take 1,200 mg by mouth daily.    . ranitidine (ZANTAC) 150 MG tablet One tablet twice daily as needed. 60 tablet 5   No current facility-administered medications for this visit.     PHYSICAL EXAMINATION: ECOG PERFORMANCE STATUS: 1 - Symptomatic but completely ambulatory  Vitals:   05/13/17 1126  BP: (!) 153/67  Pulse: 83  Resp: 18  Temp: 97.9 F (36.6 C)  SpO2: 99%   Filed Weights   05/13/17 1126  Weight: 163 lb 3.2 oz (74 kg)    GENERAL:alert, no distress and comfortable SKIN: skin color,  texture, turgor are normal, no rashes or significant lesions EYES: normal, Conjunctiva are pink and non-injected, sclera clear OROPHARYNX:no exudate, no erythema and lips, buccal mucosa, and tongue normal  NECK: supple, thyroid normal size, non-tender, without nodularity LYMPH:  no palpable lymphadenopathy in the cervical, axillary or inguinal LUNGS: clear to auscultation and percussion with normal breathing effort HEART: regular rate & rhythm and no murmurs and no lower extremity edema ABDOMEN:abdomen soft, non-tender and normal bowel sounds MUSCULOSKELETAL:no cyanosis of digits and no clubbing  NEURO: alert & oriented x 3 with fluent speech, no focal motor/sensory deficits EXTREMITIES: No lower extremity edema  LABORATORY DATA:  I have reviewed the data as listed CMP Latest Ref Rng & Units 10/09/2016 08/07/2016 07/17/2016  Glucose 65 - 99 mg/dL 107(H) 121 100  BUN 6 - 20 mg/dL 19 19.6 22.6  Creatinine 0.44 - 1.00 mg/dL 0.91 1.1 1.1  Sodium 135 - 145 mmol/L 135 137 135(L)  Potassium 3.5 -  5.1 mmol/L 3.9 4.4 4.3  Chloride 101 - 111 mmol/L 100(L) - -  CO2 22 - 32 mmol/L '27 27 26  '$ Calcium 8.9 - 10.3 mg/dL 9.0 9.8 10.1  Total Protein 6.4 - 8.3 g/dL - 7.5 7.7  Total Bilirubin 0.20 - 1.20 mg/dL - 0.22 0.28  Alkaline Phos 40 - 150 U/L - 83 83  AST 5 - 34 U/L - 26 21  ALT 0 - 55 U/L - 30 24    Lab Results  Component Value Date   WBC 6.3 05/13/2017   HGB 9.9 (L) 05/13/2017   HCT 30.3 (L) 05/13/2017   MCV 88.2 05/13/2017   PLT 310 05/13/2017   NEUTROABS 4.2 05/13/2017    ASSESSMENT & PLAN:  Breast cancer of upper-inner quadrant of left female breast ([place]) Left simple mastectomy with reconstruction 06/24/2015: IDC grade 3, 2.5 cm, with high-grade DCIS, ALH, LVI present, margins negative, 0/1 sentinel node negative, , ER 100%, PR 5%, HER-2 positive ratio 1.56 with average copy #8.25, Ki-67 20%  Pathologic stage:T2 N0 stage II a Treatment plan: 1. Adjuvant chemotherapy with TCHP X 6  completed 10/30/17followed by Herceptin maintenance for 1 year which completed 08/07/2016 2. Followed by adjuvant hormonal therapy with anastrozole started 11/28/2015 ---------------------------------------------------------------------------------------------------------- Current treatment:  Echocardiogram 05/22/16 LVEF 60-65% Anastrozole started 11/28/15  Anastrozole Toxicities:  1.  Severe hot flashes 2. muscle stiffness and achiness  Memory loss  Bone density test 04/12/2016: T score -2.2. Osteopenia. She is taking Fosamax and calcium and vitamin D.  Anxiety: On antianxiety medications  Return to clinic in 1 year for follow-up      Orders Placed This Encounter  Procedures  . CBC with Differential (Cancer Center Only)    Standing Status:   Future    Number of Occurrences:   1    Standing Expiration Date:   05/14/2018   The patient has a good understanding of the overall plan. she agrees with it. she will call with any problems that may develop before the next visit here.   Harriette Ohara, MD 05/13/17

## 2017-05-13 NOTE — Telephone Encounter (Signed)
Gave patient AVs and calendar of upcoming may 2020 appointments °

## 2017-05-25 ENCOUNTER — Other Ambulatory Visit: Payer: Self-pay | Admitting: Physician Assistant

## 2017-05-25 ENCOUNTER — Other Ambulatory Visit: Payer: Self-pay | Admitting: Allergy and Immunology

## 2017-05-25 DIAGNOSIS — K219 Gastro-esophageal reflux disease without esophagitis: Secondary | ICD-10-CM

## 2017-05-29 ENCOUNTER — Other Ambulatory Visit: Payer: Self-pay | Admitting: Physician Assistant

## 2017-05-29 DIAGNOSIS — F331 Major depressive disorder, recurrent, moderate: Secondary | ICD-10-CM

## 2017-05-29 DIAGNOSIS — G629 Polyneuropathy, unspecified: Secondary | ICD-10-CM

## 2017-06-12 DIAGNOSIS — Z853 Personal history of malignant neoplasm of breast: Secondary | ICD-10-CM | POA: Diagnosis not present

## 2017-06-12 DIAGNOSIS — Z1231 Encounter for screening mammogram for malignant neoplasm of breast: Secondary | ICD-10-CM | POA: Diagnosis not present

## 2017-07-02 ENCOUNTER — Encounter: Payer: Self-pay | Admitting: Physician Assistant

## 2017-07-02 ENCOUNTER — Ambulatory Visit: Payer: BLUE CROSS/BLUE SHIELD | Admitting: Physician Assistant

## 2017-07-02 VITALS — BP 151/78 | HR 88 | Ht 63.0 in | Wt 164.4 lb

## 2017-07-02 DIAGNOSIS — M1712 Unilateral primary osteoarthritis, left knee: Secondary | ICD-10-CM

## 2017-07-02 DIAGNOSIS — F331 Major depressive disorder, recurrent, moderate: Secondary | ICD-10-CM | POA: Diagnosis not present

## 2017-07-02 DIAGNOSIS — G629 Polyneuropathy, unspecified: Secondary | ICD-10-CM

## 2017-07-02 MED ORDER — DULOXETINE HCL 60 MG PO CPEP: ORAL_CAPSULE | ORAL | 11 refills | Status: DC

## 2017-07-02 MED ORDER — HYDROCODONE-ACETAMINOPHEN 10-325 MG PO TABS: 1.0000 | ORAL_TABLET | ORAL | 0 refills | Status: DC | PRN

## 2017-07-02 NOTE — Progress Notes (Signed)
BP (!) 151/78   Pulse 88   Ht 5\' 3"  (1.6 m)   Wt 164 lb 6.4 oz (74.6 kg)   BMI 29.12 kg/m    Subjective:    Patient ID: Heidi Walls, female    DOB: December 20, 1955, 62 y.o.   MRN: [id number]  HPI: Heidi Walls is a 62 y.o. female presenting on 07/02/2017 for Depression (2 month follow up )  This patient comes in for periodic recheck on medications and conditions including **neuropathy, depression, arthritis of the knee.  The patient is out of work at this point.  She has had a great relief in her overall tension and stress.  She feels that this will be a good change ultimately.  She has put in for disability.  They are trying to work on that.*.   All medications are reviewed today. There are no reports of any problems with the medications. All of the medical conditions are reviewed and updated.  Lab work is reviewed and will be ordered as medically necessary. There are no new problems reported with today's visit.   Past Medical History:  Diagnosis Date  . Anemia   . Anxiety    OCD  . Arthritis    knees and hips  . Asthma   . Breast cancer ([place])   . Breast cancer of upper-inner quadrant of left female breast ([place]) 05/26/2015  . GERD (gastroesophageal reflux disease)   . Hypertension   . Insomnia    Relevant past medical, surgical, family and social history reviewed and updated as indicated. Interim medical history since our last visit reviewed. Allergies and medications reviewed and updated. DATA REVIEWED: CHART IN EPIC  Family History reviewed for pertinent findings.  Review of Systems  Constitutional: Negative.   HENT: Negative.   Eyes: Negative.   Respiratory: Negative.   Gastrointestinal: Negative.   Genitourinary: Negative.     Allergies as of 07/02/2017      Reactions   Codeine Shortness Of Breath   Pt reports this was 30 years ago. Pt reports this was 30 years ago.   Lisinopril Cough      Medication List        Accurate as of 07/02/17 10:53 AM. Always  use your most recent med list.          acetaminophen 325 MG tablet Commonly known as:  TYLENOL Take 650 mg by mouth daily.   Acidophilus Tabs Take by mouth.   albuterol 108 (90 Base) MCG/ACT inhaler Commonly known as:  PROAIR HFA Inhale 2 puffs into the lungs every 4 (four) hours as needed for wheezing or shortness of breath.   alendronate 70 MG tablet Commonly known as:  FOSAMAX TAKE 1 TABLET BY MOUTH ONCE WEEKLY WITH A FULL GLASS OF WATER ON AN EMPTY STOMACH   ALPRAZolam 0.5 MG tablet Commonly known as:  XANAX Take 1 tablet (0.5 mg total) by mouth 3 (three) times daily as needed.   Azelastine HCl 0.15 % Soln One spray each nostril 1-2 times a day as needed   budesonide-formoterol 160-4.5 MCG/ACT inhaler Commonly known as:  SYMBICORT TWO PUFFS WITH SPACER TWICE A DAY TO PREVENT COUGH OR WHEEZE.   CALCIUM 600 + D PO Take 1 tablet by mouth daily.   Carbinoxamine Maleate 4 MG Tabs 1 tablet every 8 hours as needed for runny nose or itching.   diclofenac 75 MG EC tablet Commonly known as:  VOLTAREN Take 1 tablet (75 mg total) by mouth 2 (two)  times daily.   DULoxetine 60 MG capsule Commonly known as:  CYMBALTA TAKE 1 CAPSULE BY MOUTH EVERY DAY   esomeprazole 20 MG capsule Commonly known as:  NEXIUM Take 20 mg by mouth daily.   FERROUS SULFATE PO Take 1 tablet by mouth daily.   Fish Oil 1200 MG Caps Take 1,200 mg by mouth daily.   FOLIC ACID PO Take 1 tablet by mouth daily.   gabapentin 300 MG capsule Commonly known as:  NEURONTIN TAKE 1-3 CAPSULES BY MOUTH 3 TIMES DAILY.   hydrochlorothiazide 25 MG tablet Commonly known as:  HYDRODIURIL Take 25 mg by mouth daily.   HYDROcodone-acetaminophen 10-325 MG tablet Commonly known as:  NORCO Take 1 tablet by mouth every 4 (four) hours as needed.   letrozole 2.5 MG tablet Commonly known as:  FEMARA Take 1 tablet (2.5 mg total) by mouth daily.   multivitamin tablet Take 1 tablet by mouth daily.     nystatin 100000 UNIT/ML suspension Commonly known as:  MYCOSTATIN Take 5 mLs (500,000 Units total) by mouth 4 (four) times daily.   olmesartan-hydrochlorothiazide 20-12.5 MG tablet Commonly known as:  BENICAR HCT TAKE 1 TABLET BY MOUTH EVERY DAY   olopatadine 0.1 % ophthalmic solution Commonly known as:  PATANOL Place 1 drop into both eyes 2 (two) times daily.   ranitidine 150 MG tablet Commonly known as:  ZANTAC ONE TABLET TWICE DAILY AS NEEDED.   SUPER B COMPLEX PO Take 1 tablet by mouth daily.   vitamin C 1000 MG tablet Take 1,000 mg by mouth daily.          Objective:    BP (!) 151/78   Pulse 88   Ht 5\' 3"  (1.6 m)   Wt 164 lb 6.4 oz (74.6 kg)   BMI 29.12 kg/m   Allergies  Allergen Reactions  . Codeine Shortness Of Breath    Pt reports this was 30 years ago. Pt reports this was 30 years ago.  Marland Kitchen Lisinopril Cough    Wt Readings from Last 3 Encounters:  07/02/17 164 lb 6.4 oz (74.6 kg)  05/13/17 163 lb 3.2 oz (74 kg)  05/07/17 160 lb 9.6 oz (72.8 kg)    Physical Exam  Constitutional: She is oriented to person, place, and time. She appears well-developed and well-nourished.  HENT:  Head: Normocephalic and atraumatic.  Eyes: Pupils are equal, round, and reactive to light. Conjunctivae and EOM are normal.  Cardiovascular: Normal rate, regular rhythm, normal heart sounds and intact distal pulses.  Pulmonary/Chest: Effort normal and breath sounds normal.  Abdominal: Soft. Bowel sounds are normal.  Neurological: She is alert and oriented to person, place, and time. She has normal reflexes.  Skin: Skin is warm and dry. No rash noted.  Psychiatric: She has a normal mood and affect. Her behavior is normal. Judgment and thought content normal.    Results for orders placed or performed in visit on 05/13/17  CBC with Differential ([facility] Only)  Result Value Ref Range   WBC Count 6.3 3.9 - 10.3 K/uL   RBC 3.44 (L) 3.70 - 5.45 MIL/uL   Hemoglobin 9.9 (L)  11.6 - 15.9 g/dL   HCT 30.3 (L) 34.8 - 46.6 %   MCV 88.2 79.5 - 101.0 fL   MCH 28.9 25.1 - 34.0 pg   MCHC 32.8 31.5 - 36.0 g/dL   RDW 13.5 11.2 - 14.5 %   Platelet Count 310 145 - 400 K/uL   Neutrophils Relative % 66 %  Neutro Abs 4.2 1.5 - 6.5 K/uL   Lymphocytes Relative 24 %   Lymphs Abs 1.5 0.9 - 3.3 K/uL   Monocytes Relative 7 %   Monocytes Absolute 0.4 0.1 - 0.9 K/uL   Eosinophils Relative 2 %   Eosinophils Absolute 0.1 0.0 - 0.5 K/uL   Basophils Relative 1 %   Basophils Absolute 0.0 0.0 - 0.1 K/uL      Assessment & Plan:   1. Neuropathy - DULoxetine (CYMBALTA) 60 MG capsule; TAKE 1 CAPSULE BY MOUTH EVERY DAY  Dispense: 30 capsule; Refill: 11 - HYDROcodone-acetaminophen (NORCO) 10-325 MG tablet; Take 1 tablet by mouth every 4 (four) hours as needed.  Dispense: 40 tablet; Refill: 0  2. Moderate episode of recurrent major depressive disorder ([facility]) - DULoxetine (CYMBALTA) 60 MG capsule; TAKE 1 CAPSULE BY MOUTH EVERY DAY  Dispense: 30 capsule; Refill: 11  3. Arthritis of knee, left - HYDROcodone-acetaminophen (NORCO) 10-325 MG tablet; Take 1 tablet by mouth every 4 (four) hours as needed.  Dispense: 40 tablet; Refill: 0   Continue all other maintenance medications as listed above.  Follow up plan: Return in about 3 months (around 10/02/2017) for recheck and labs.  Educational handout given for [place] PA-C [facility] [address]  [place], [place] [address] [phone]   07/02/2017, 10:53 AM

## 2017-07-02 NOTE — Patient Instructions (Signed)
In a few days you may receive a survey in the mail or online from [employer] regarding your visit with us today. Please take a moment to fill this out. Your feedback is very important to our whole office. It can help us better understand your needs as well as improve your experience and satisfaction. Thank you for taking your time to complete it. We care about you.  Yazan Gatling, PA-C  

## 2017-07-09 ENCOUNTER — Other Ambulatory Visit: Payer: Self-pay | Admitting: Physician Assistant

## 2017-07-09 DIAGNOSIS — G893 Neoplasm related pain (acute) (chronic): Secondary | ICD-10-CM

## 2017-07-16 ENCOUNTER — Other Ambulatory Visit: Payer: Self-pay | Admitting: Physician Assistant

## 2017-07-16 DIAGNOSIS — C50912 Malignant neoplasm of unspecified site of left female breast: Secondary | ICD-10-CM | POA: Diagnosis not present

## 2017-07-16 MED ORDER — PREDNISONE 10 MG (21) PO TBPK: ORAL_TABLET | ORAL | 0 refills | Status: DC

## 2017-07-18 ENCOUNTER — Telehealth: Payer: Self-pay | Admitting: Physician Assistant

## 2017-07-18 NOTE — Telephone Encounter (Signed)
Does she need a muscle relaxer?

## 2017-07-18 NOTE — Telephone Encounter (Signed)
What symptoms do you have? Went to the bathroom and her back went on the left side when getting up and she can't hardly move. Patient has cancer and she hurts on the left side a lot.  How long have you been sick? This morning  Have you been seen for this problem? yes  If your provider decides to give you a prescription, which pharmacy would you like for it to be sent to? [facility] [place] on [address]   Patient informed that this information will be sent to the clinical staff for review and that they should receive a follow up call.

## 2017-07-18 NOTE — Telephone Encounter (Signed)
Patient aware Glenard Haring is not here and she was calling to let her know that she thinks she strained her back.  Patient states that she has prednisone and hydrocodone that she can take but wanted to make sure there was nothing else to take.  Aware Glenard Haring will be back in the office Friday. Please advise

## 2017-07-19 ENCOUNTER — Other Ambulatory Visit: Payer: Self-pay | Admitting: Physician Assistant

## 2017-07-19 MED ORDER — CYCLOBENZAPRINE HCL 10 MG PO TABS: 10.0000 mg | ORAL_TABLET | Freq: Three times a day (TID) | ORAL | 0 refills | Status: DC | PRN

## 2017-07-19 NOTE — Telephone Encounter (Signed)
sent 

## 2017-07-19 NOTE — Telephone Encounter (Signed)
Patient aware.

## 2017-07-19 NOTE — Telephone Encounter (Signed)
Yes patient would like muscle relaxer

## 2017-07-23 ENCOUNTER — Other Ambulatory Visit: Payer: Self-pay | Admitting: Physician Assistant

## 2017-07-23 DIAGNOSIS — M1711 Unilateral primary osteoarthritis, right knee: Secondary | ICD-10-CM

## 2017-07-24 ENCOUNTER — Other Ambulatory Visit: Payer: Self-pay | Admitting: Physician Assistant

## 2017-07-28 ENCOUNTER — Other Ambulatory Visit: Payer: Self-pay | Admitting: Allergy and Immunology

## 2017-07-28 DIAGNOSIS — J4540 Moderate persistent asthma, uncomplicated: Secondary | ICD-10-CM

## 2017-07-29 ENCOUNTER — Other Ambulatory Visit: Payer: Self-pay | Admitting: Physician Assistant

## 2017-07-29 DIAGNOSIS — G893 Neoplasm related pain (acute) (chronic): Secondary | ICD-10-CM

## 2017-07-29 MED ORDER — GABAPENTIN 300 MG PO CAPS
ORAL_CAPSULE | ORAL | 1 refills | Status: DC
Start: 2017-07-29 — End: 2017-08-20

## 2017-08-01 ENCOUNTER — Other Ambulatory Visit: Payer: Self-pay | Admitting: Hematology and Oncology

## 2017-08-12 ENCOUNTER — Other Ambulatory Visit: Payer: Self-pay | Admitting: Physician Assistant

## 2017-08-12 ENCOUNTER — Telehealth: Payer: Self-pay | Admitting: Physician Assistant

## 2017-08-12 MED ORDER — MAGIC MOUTHWASH: 5.0000 mL | Freq: Four times a day (QID) | ORAL | 0 refills | Status: DC

## 2017-08-12 NOTE — Progress Notes (Signed)
Mouthwash

## 2017-08-12 NOTE — Telephone Encounter (Signed)
Pt. Aware.

## 2017-08-12 NOTE — Telephone Encounter (Signed)
Sent magic mouth wash

## 2017-08-12 NOTE — Telephone Encounter (Signed)
Her dentist usually gives her nystatin susp for this - it is not working this time.  Tongue has red welps and sores under tongue Yeast on tongue and in throat She Is hoarse  She is out of nystatin - does have a refill at pharm = thinks she may need something else too  [facility]

## 2017-08-16 ENCOUNTER — Other Ambulatory Visit: Payer: Self-pay | Admitting: Physician Assistant

## 2017-08-16 ENCOUNTER — Other Ambulatory Visit: Payer: Self-pay

## 2017-08-16 DIAGNOSIS — Z1239 Encounter for other screening for malignant neoplasm of breast: Secondary | ICD-10-CM

## 2017-08-20 ENCOUNTER — Encounter: Payer: Self-pay | Admitting: Family

## 2017-08-20 ENCOUNTER — Other Ambulatory Visit: Payer: Self-pay | Admitting: Physician Assistant

## 2017-08-20 ENCOUNTER — Ambulatory Visit: Payer: BLUE CROSS/BLUE SHIELD | Admitting: Family

## 2017-08-20 VITALS — BP 118/77 | HR 85 | Temp 98.4°F | Ht 63.0 in | Wt 164.0 lb

## 2017-08-20 DIAGNOSIS — G629 Polyneuropathy, unspecified: Secondary | ICD-10-CM | POA: Diagnosis not present

## 2017-08-20 DIAGNOSIS — M1712 Unilateral primary osteoarthritis, left knee: Secondary | ICD-10-CM | POA: Diagnosis not present

## 2017-08-20 DIAGNOSIS — G893 Neoplasm related pain (acute) (chronic): Secondary | ICD-10-CM

## 2017-08-20 MED ORDER — HYDROCODONE-ACETAMINOPHEN 10-325 MG PO TABS: 1.0000 | ORAL_TABLET | ORAL | 0 refills | Status: DC | PRN

## 2017-08-20 MED ORDER — CYCLOBENZAPRINE HCL 10 MG PO TABS: 10.0000 mg | ORAL_TABLET | Freq: Three times a day (TID) | ORAL | 0 refills | Status: DC | PRN

## 2017-08-20 NOTE — Patient Instructions (Signed)

## 2017-08-20 NOTE — Progress Notes (Signed)
Subjective:    Patient ID: Heidi Walls, female    DOB: 06/13/1955, 62 y.o.   MRN: [id number]  Chief Complaint  Patient presents with  . Joint Pain   Pt presents to the office today with osteoarthritis. PT states her PCP gave her Norco #40 in 06/2017. She states her step daughter stayed with her and switched her Norco with tylenol. She states she only would take half a tablet as needed and only when she "could not take the pain."  She states it has been two weeks since she has even taken Norco.  Arthritis  Presents for follow-up visit. She complains of pain and stiffness. Affected locations include the left knee, right knee and left hip. Her pain is at a severity of 7/10.      Review of Systems  Musculoskeletal: Positive for arthritis and stiffness.  All other systems reviewed and are negative.      Objective:   Physical Exam  Constitutional: She is oriented to person, place, and time. She appears well-developed and well-nourished. No distress.  HENT:  Head: Normocephalic and atraumatic.  Right Ear: External ear normal.  Left Ear: External ear normal.  Mouth/Throat: Oropharynx is clear and moist.  Eyes: Pupils are equal, round, and reactive to light.  Neck: Normal range of motion. Neck supple. No thyromegaly present.  Cardiovascular: Normal rate, regular rhythm, normal heart sounds and intact distal pulses.  No murmur heard. Pulmonary/Chest: Effort normal and breath sounds normal. No respiratory distress. She has no wheezes.  Abdominal: Soft. Bowel sounds are normal. She exhibits no distension. There is no tenderness.  Musculoskeletal: She exhibits no edema or tenderness.  Pain in lower lumbar with flexion or extension, pain in bilateral knee's with flexion and extension  Neurological: She is alert and oriented to person, place, and time. She has normal reflexes. No cranial nerve deficit.  Skin: Skin is warm and dry.  Psychiatric: She has a normal mood and affect. Her  behavior is normal. Judgment and thought content normal.  Vitals reviewed.     BP 118/77   Pulse 85   Temp 98.4 F (36.9 C) (Oral)   Ht 5\' 3"  (1.6 m)   Wt 164 lb (74.4 kg)   BMI 29.05 kg/m      Assessment & Plan:  SAHEJ HAUSWIRTH comes in today with chief complaint of Joint Pain   Diagnosis and orders addressed:  1. Arthritis of knee, left - cyclobenzaprine (FLEXERIL) 10 MG tablet; Take 1 tablet (10 mg total) by mouth 3 (three) times daily as needed for muscle spasms.  Dispense: 30 tablet; Refill: 0 - HYDROcodone-acetaminophen (NORCO) 10-325 MG tablet; Take 1 tablet by mouth every 4 (four) hours as needed.  Dispense: 40 tablet; Refill: 0 - ToxASSURE Select 13 (MW), Urine  2. Neuropathy - cyclobenzaprine (FLEXERIL) 10 MG tablet; Take 1 tablet (10 mg total) by mouth 3 (three) times daily as needed for muscle spasms.  Dispense: 30 tablet; Refill: 0 - HYDROcodone-acetaminophen (NORCO) 10-325 MG tablet; Take 1 tablet by mouth every 4 (four) hours as needed.  Dispense: 40 tablet; Refill: 0 - ToxASSURE Select 13 (MW), Urine   PT's PCP is out of town this week and patient is leaving out [place] the country in the next two weeks. We discussed I would give her one script of Norco today, but would have to follow up with PCP for refills. Pt reviewed in [place] controlled database and no red flags noted.    Follow up plan:  Keep follow up with PCP   Evelina Dun, FNP

## 2017-08-25 LAB — TOXASSURE SELECT 13 (MW), URINE

## 2017-08-28 ENCOUNTER — Other Ambulatory Visit: Payer: Self-pay

## 2017-08-28 ENCOUNTER — Telehealth: Payer: Self-pay

## 2017-08-28 DIAGNOSIS — I890 Lymphedema, not elsewhere classified: Secondary | ICD-10-CM

## 2017-08-28 NOTE — Telephone Encounter (Signed)
Orders placed for cancer rehab.  Appt made.  Pt aware.

## 2017-08-28 NOTE — Progress Notes (Signed)
refe

## 2017-08-29 ENCOUNTER — Ambulatory Visit: Payer: BLUE CROSS/BLUE SHIELD | Admitting: Physical Therapy

## 2017-08-29 DIAGNOSIS — Z9012 Acquired absence of left breast and nipple: Secondary | ICD-10-CM | POA: Diagnosis not present

## 2017-08-29 DIAGNOSIS — Z853 Personal history of malignant neoplasm of breast: Secondary | ICD-10-CM | POA: Diagnosis not present

## 2017-08-30 ENCOUNTER — Encounter: Payer: Self-pay | Admitting: *Deleted

## 2017-09-11 ENCOUNTER — Other Ambulatory Visit: Payer: Self-pay | Admitting: Physician Assistant

## 2017-09-11 ENCOUNTER — Ambulatory Visit: Payer: BLUE CROSS/BLUE SHIELD | Attending: Hematology and Oncology

## 2017-09-11 ENCOUNTER — Other Ambulatory Visit: Payer: Self-pay | Admitting: Allergy and Immunology

## 2017-09-11 ENCOUNTER — Other Ambulatory Visit: Payer: Self-pay

## 2017-09-11 DIAGNOSIS — I972 Postmastectomy lymphedema syndrome: Secondary | ICD-10-CM | POA: Diagnosis not present

## 2017-09-11 DIAGNOSIS — M6281 Muscle weakness (generalized): Secondary | ICD-10-CM | POA: Insufficient documentation

## 2017-09-11 MED ORDER — FLUCONAZOLE 150 MG PO TABS
ORAL_TABLET | ORAL | 11 refills | Status: DC
Start: 2017-09-11 — End: 2018-03-24

## 2017-09-11 NOTE — Therapy (Signed)
[facility] [place], [place], [address] Phone: [phone]   Fax:  [phone]  Patient Details  Name: Heidi Walls MRN: [id number] Date of Birth: 1955/05/06 Referring Provider:  Nicholas Lose, MD  Encounter Date: 09/11/2017                                            [name] arrived for FCE testing  but had elevated blood pressure that never decreased below 167/90.  The ERGOSCIENCE protocol  sets the limits at 150/100 for blood pressure and as long as after performing the tasks the BP decreases to this level or lower  after rest we proceed with testing. As her BP never decreased to this level the testing was stopped.  She will monitor her BP at home for a week with 4 BP assessments per day and activity she was doing prior to the assessment.  If her numbers are 150/100 or below consistently we will cautiously proceed with testing.. If her numbers are elevated we will have her return to her primary care MD for assessment. She will need clearance by  her PCP to return for testing.  We hope to have this done by the end of September.                              Darrel Hoover  PT 09/11/2017, 8:59 AM  [facility] [address] [place], [place], [address] Phone: [phone]   Fax:  [phone]

## 2017-09-17 ENCOUNTER — Other Ambulatory Visit: Payer: Self-pay | Admitting: Physician Assistant

## 2017-09-17 DIAGNOSIS — F331 Major depressive disorder, recurrent, moderate: Secondary | ICD-10-CM

## 2017-09-17 DIAGNOSIS — G629 Polyneuropathy, unspecified: Secondary | ICD-10-CM

## 2017-09-25 ENCOUNTER — Ambulatory Visit: Payer: BLUE CROSS/BLUE SHIELD | Admitting: Allergy and Immunology

## 2017-09-25 ENCOUNTER — Encounter: Payer: Self-pay | Admitting: Allergy and Immunology

## 2017-09-25 VITALS — BP 128/64 | HR 88 | Temp 97.6°F | Resp 16

## 2017-09-25 DIAGNOSIS — J4540 Moderate persistent asthma, uncomplicated: Secondary | ICD-10-CM

## 2017-09-25 DIAGNOSIS — K219 Gastro-esophageal reflux disease without esophagitis: Secondary | ICD-10-CM

## 2017-09-25 DIAGNOSIS — J4520 Mild intermittent asthma, uncomplicated: Secondary | ICD-10-CM | POA: Diagnosis not present

## 2017-09-25 DIAGNOSIS — J3089 Other allergic rhinitis: Secondary | ICD-10-CM

## 2017-09-25 MED ORDER — BUDESONIDE-FORMOTEROL FUMARATE 160-4.5 MCG/ACT IN AERO: INHALATION_SPRAY | RESPIRATORY_TRACT | 0 refills | Status: DC

## 2017-09-25 NOTE — Patient Instructions (Addendum)
Moderate persistent asthma Currently well controlled.    Continue Symbicort (budesonide/formoterol) 160/4.5 g, 2 inhalations via spacer device twice a day.   Continue albuterol HFA, 1-2 inhalations every 4-6 hours as needed and 15 minutes prior to exercise/vigorous activity.  Subjective and objective measures of pulmonary function will be followed and the treatment plan will be adjusted accordingly.  Perennial allergic rhinitis with a probable nonallergic component Stable.  Continue appropriate allergen avoidance measures, azelastine nasal spray as needed, nasal saline irrigation as needed, and carbinoxamine as needed.  GERD (gastroesophageal reflux disease)  Continue appropriate reflux lifestyle modifications, Nexium as prescribed, and ranitidine as prescribed.   Return in about 4 months (around 01/25/2018), or if symptoms worsen or fail to improve.

## 2017-09-25 NOTE — Progress Notes (Signed)
Follow-up Note  RE: Heidi Walls MRN: [id number] DOB: [birth year] Date of Office Visit: 09/25/2017  Primary care provider: Terald Sleeper, PA-C Referring provider: Terald Sleeper, PA-C  History of present illness: Heidi Walls is a 62 y.o. female with persistent asthma, allergic rhinitis, and gastroesophageal reflux presenting today for follow-up.  She was last seen in this clinic in March 2019.  She reports that while taking Symbicort 160-4.5 g, 2 inhalations via spacer device twice daily, that her asthma has been relatively well controlled.  She rarely requires albuterol rescue, denies limitations in normal daily activities, and denies nocturnal awakenings due to lower respiratory symptoms.  She is able to tell if she forgets her Symbicort based upon lower respiratory symptoms.  She is walking daily with a friend for exercise and plans to do a 5K walk for breast cancer in the near future.  Her nasal allergy symptoms are well controlled when taking azelastine nasal spray and carbinoxamine as needed.  She has no reflux related complaints today.  She states that her voice has improved over the past few months since having stopped working and "not talking 24/7."  Assessment and plan: Moderate persistent asthma Currently well controlled.    Continue Symbicort (budesonide/formoterol) 160/4.5 g, 2 inhalations via spacer device twice a day.   Continue albuterol HFA, 1-2 inhalations every 4-6 hours as needed and 15 minutes prior to exercise/vigorous activity.  Subjective and objective measures of pulmonary function will be followed and the treatment plan will be adjusted accordingly.  Perennial allergic rhinitis with a probable nonallergic component Stable.  Continue appropriate allergen avoidance measures, azelastine nasal spray as needed, nasal saline irrigation as needed, and carbinoxamine as needed.  GERD (gastroesophageal reflux disease)  Continue appropriate reflux lifestyle  modifications, Nexium as prescribed, and ranitidine as prescribed.   Meds ordered this encounter  Medications  . budesonide-formoterol (SYMBICORT) 160-4.5 MCG/ACT inhaler    Sig: TWO PUFFS WITH SPACER TWICE A DAY TO PREVENT COUGH OR WHEEZE.    Dispense:  30.6 Inhaler    Refill:  0    Diagnostics: Spirometry:  Normal with an FEV1 of 82% predicted and an FEV1 ratio of 94%.  Please see scanned spirometry results for details.    Physical examination: Blood pressure 128/64, pulse 88, temperature 97.6 F (36.4 C), temperature source Oral, resp. rate 16, SpO2 95 %.  General: Alert, interactive, in no acute distress. HEENT: TMs pearly gray, turbinates mildly edematous without discharge, post-pharynx mildly erythematous. Neck: Supple without lymphadenopathy. Lungs: Clear to auscultation without wheezing, rhonchi or rales. CV: Normal S1, S2 without murmurs. Skin: Warm and dry, without lesions or rashes.  The following portions of the patient's history were reviewed and updated as appropriate: allergies, current medications, past family history, past medical history, past social history, past surgical history and problem list.  Allergies as of 09/25/2017      Reactions   Codeine Shortness Of Breath   Pt reports this was 30 years ago.   Lisinopril Cough   Sulfamethoxazole       Medication List        Accurate as of 09/25/17 11:13 AM. Always use your most recent med list.          acetaminophen 325 MG tablet Commonly known as:  TYLENOL Take 650 mg by mouth daily.   Acidophilus Tabs Take by mouth.   albuterol 108 (90 Base) MCG/ACT inhaler Commonly known as:  PROVENTIL HFA;VENTOLIN HFA Inhale 2 puffs into the lungs every  4 (four) hours as needed for wheezing or shortness of breath.   alendronate 70 MG tablet Commonly known as:  FOSAMAX TAKE 1 TABLET BY MOUTH ONCE WEEKLY WITH A FULL GLASS OF WATER ON AN EMPTY STOMACH   ALPRAZolam 0.5 MG tablet Commonly known as:  XANAX Take  1 tablet (0.5 mg total) by mouth 3 (three) times daily as needed.   Azelastine HCl 0.15 % Soln One spray each nostril 1-2 times a day as needed   budesonide-formoterol 160-4.5 MCG/ACT inhaler Commonly known as:  SYMBICORT TWO PUFFS WITH SPACER TWICE A DAY TO PREVENT COUGH OR WHEEZE.   CALCIUM 600 + D PO Take 1 tablet by mouth daily.   Carbinoxamine Maleate 4 MG Tabs TAKE 1 TABLET EVERY 8 HOURS AS NEEDED FOR RUNNY NOSE OR ITCHING.   cyclobenzaprine 10 MG tablet Commonly known as:  FLEXERIL Take 1 tablet (10 mg total) by mouth 3 (three) times daily as needed for muscle spasms.   diclofenac 75 MG EC tablet Commonly known as:  VOLTAREN TAKE 1 TABLET BY MOUTH TWICE A DAY   DULoxetine 60 MG capsule Commonly known as:  CYMBALTA TAKE 1 CAPSULE BY MOUTH EVERY DAY   esomeprazole 20 MG capsule Commonly known as:  NEXIUM Take 20 mg by mouth daily.   Fish Oil 1200 MG Caps Take 1,200 mg by mouth daily.   fluconazole 150 MG tablet Commonly known as:  DIFLUCAN 1 po q week x 4 weeks   FOLIC ACID PO Take 1 tablet by mouth daily.   gabapentin 300 MG capsule Commonly known as:  NEURONTIN TAKE 1-3 CAPSULES BY MOUTH 3 TIMES DAILY.   hydrochlorothiazide 25 MG tablet Commonly known as:  HYDRODIURIL Take 25 mg by mouth daily.   HYDROcodone-acetaminophen 10-325 MG tablet Commonly known as:  NORCO Take 1 tablet by mouth every 4 (four) hours as needed.   letrozole 2.5 MG tablet Commonly known as:  FEMARA Take 1 tablet (2.5 mg total) by mouth daily.   lidocaine 2 % solution Commonly known as:  XYLOCAINE   multivitamin tablet Take 1 tablet by mouth daily.   olmesartan-hydrochlorothiazide 20-12.5 MG tablet Commonly known as:  BENICAR HCT TAKE 1 TABLET BY MOUTH EVERY DAY   olopatadine 0.1 % ophthalmic solution Commonly known as:  PATANOL Place 1 drop into both eyes 2 (two) times daily.   ranitidine 150 MG tablet Commonly known as:  ZANTAC ONE TABLET TWICE DAILY AS NEEDED.     SUPER B COMPLEX PO Take 1 tablet by mouth daily.   valACYclovir 1000 MG tablet Commonly known as:  VALTREX Take 1,000 mg by mouth as needed.   vitamin C 1000 MG tablet Take 1,000 mg by mouth daily.       Allergies  Allergen Reactions  . Codeine Shortness Of Breath    Pt reports this was 30 years ago.   Marland Kitchen Lisinopril Cough  . Sulfamethoxazole    Review of systems: Review of systems negative except as noted in HPI / PMHx or noted below: Constitutional: Negative.  HENT: Negative.   Eyes: Negative.  Respiratory: Negative.   Cardiovascular: Negative.  Gastrointestinal: Negative.  Genitourinary: Negative.  Musculoskeletal: Negative.  Neurological: Negative.  Endo/Heme/Allergies: Negative.  Cutaneous: Negative.  Past Medical History:  Diagnosis Date  . Anemia   . Anxiety    OCD  . Arthritis    knees and hips  . Asthma   . Breast cancer ([place])   . Breast cancer of upper-inner quadrant of left female  breast ([place]) 05/26/2015  . GERD (gastroesophageal reflux disease)   . Hypertension   . Insomnia     Family History  Problem Relation Age of Onset  . Heart failure Mother   . Diabetes Mother   . Hypertension Mother   . Hypertension Father   . Breast cancer Maternal Grandmother   . Allergic rhinitis Sister   . Sinusitis Sister   . Allergic rhinitis Daughter   . Allergic rhinitis Son   . Sinusitis Son   . Bronchitis Grandchild   . Asthma Neg Hx   . Eczema Neg Hx   . Urticaria Neg Hx   . Immunodeficiency Neg Hx   . Angioedema Neg Hx     Social History   Socioeconomic History  . Marital status: Married    Spouse name: Not on file  . Number of children: Not on file  . Years of education: Not on file  . Highest education level: Not on file  Occupational History  . Not on file  Social Needs  . Financial resource strain: Not on file  . Food insecurity:    Worry: Not on file    Inability: Not on file  . Transportation needs:    Medical: Not on file     Non-medical: Not on file  Tobacco Use  . Smoking status: Former Smoker    Packs/day: 1.00    Years: 25.00    Pack years: 25.00    Types: Cigarettes    Last attempt to quit: 10/09/2005    Years since quitting: 11.9  . Smokeless tobacco: Never Used  Substance and Sexual Activity  . Alcohol use: No    Frequency: Never  . Drug use: No  . Sexual activity: Not on file  Lifestyle  . Physical activity:    Days per week: Not on file    Minutes per session: Not on file  . Stress: Not on file  Relationships  . Social connections:    Talks on phone: Not on file    Gets together: Not on file    Attends religious service: Not on file    Active member of club or organization: Not on file    Attends meetings of clubs or organizations: Not on file    Relationship status: Not on file  . Intimate partner violence:    Fear of current or ex partner: Not on file    Emotionally abused: Not on file    Physically abused: Not on file    Forced sexual activity: Not on file  Other Topics Concern  . Not on file  Social History Narrative  . Not on file    I appreciate the opportunity to take part in Shelda's care. Please do not hesitate to contact me with questions.  Sincerely,   R. Edgar Frisk, MD

## 2017-09-25 NOTE — Assessment & Plan Note (Deleted)
Currently well controlled.    Continue Symbicort (budesonide/formoterol) 160/4.5 g, 2 inhalations via spacer device twice a day.   Continue albuterol HFA, 1-2 inhalations every 4-6 hours as needed and 15 minutes prior to exercise/vigorous activity.  Subjective and objective measures of pulmonary function will be followed and the treatment plan will be adjusted accordingly.

## 2017-09-25 NOTE — Assessment & Plan Note (Signed)
Currently well controlled.    Continue Symbicort (budesonide/formoterol) 160/4.5 g, 2 inhalations via spacer device twice a day.   Continue albuterol HFA, 1-2 inhalations every 4-6 hours as needed and 15 minutes prior to exercise/vigorous activity.  Subjective and objective measures of pulmonary function will be followed and the treatment plan will be adjusted accordingly.

## 2017-09-25 NOTE — Assessment & Plan Note (Signed)
Stable.  Continue appropriate allergen avoidance measures, azelastine nasal spray as needed, nasal saline irrigation as needed, and carbinoxamine as needed.

## 2017-09-25 NOTE — Assessment & Plan Note (Signed)
   Continue appropriate reflux lifestyle modifications, Nexium as prescribed, and ranitidine as prescribed.

## 2017-09-28 ENCOUNTER — Other Ambulatory Visit: Payer: Self-pay | Admitting: Allergy & Immunology

## 2017-10-03 ENCOUNTER — Ambulatory Visit: Payer: BLUE CROSS/BLUE SHIELD | Admitting: Physician Assistant

## 2017-10-03 ENCOUNTER — Encounter: Payer: Self-pay | Admitting: Physician Assistant

## 2017-10-03 VITALS — BP 138/77 | HR 84 | Temp 97.8°F | Ht 63.0 in | Wt 164.0 lb

## 2017-10-03 DIAGNOSIS — G629 Polyneuropathy, unspecified: Secondary | ICD-10-CM | POA: Diagnosis not present

## 2017-10-03 DIAGNOSIS — Z23 Encounter for immunization: Secondary | ICD-10-CM

## 2017-10-03 DIAGNOSIS — F411 Generalized anxiety disorder: Secondary | ICD-10-CM

## 2017-10-03 DIAGNOSIS — G893 Neoplasm related pain (acute) (chronic): Secondary | ICD-10-CM

## 2017-10-03 DIAGNOSIS — M1712 Unilateral primary osteoarthritis, left knee: Secondary | ICD-10-CM

## 2017-10-03 MED ORDER — ALPRAZOLAM 0.5 MG PO TABS: 0.5000 mg | ORAL_TABLET | Freq: Three times a day (TID) | ORAL | 5 refills | Status: DC | PRN

## 2017-10-03 MED ORDER — OLOPATADINE HCL 0.1 % OP SOLN: 1.0000 [drp] | Freq: Two times a day (BID) | OPHTHALMIC | 12 refills | Status: DC

## 2017-10-03 MED ORDER — HYDROCODONE-ACETAMINOPHEN 10-325 MG PO TABS: 1.0000 | ORAL_TABLET | Freq: Four times a day (QID) | ORAL | 0 refills | Status: DC | PRN

## 2017-10-03 MED ORDER — ALENDRONATE SODIUM 10 MG PO TABS: 10.0000 mg | ORAL_TABLET | Freq: Every day | ORAL | 11 refills | Status: DC

## 2017-10-04 ENCOUNTER — Ambulatory Visit: Payer: BLUE CROSS/BLUE SHIELD | Admitting: Physician Assistant

## 2017-10-04 ENCOUNTER — Ambulatory Visit: Payer: BLUE CROSS/BLUE SHIELD

## 2017-10-04 DIAGNOSIS — M6281 Muscle weakness (generalized): Secondary | ICD-10-CM

## 2017-10-04 DIAGNOSIS — I972 Postmastectomy lymphedema syndrome: Secondary | ICD-10-CM

## 2017-10-04 LAB — CMP14+EGFR
ALBUMIN: 4.4 g/dL (ref 3.6–4.8)
ALT: 26 IU/L (ref 0–32)
AST: 24 IU/L (ref 0–40)
Albumin/Globulin Ratio: 1.6 (ref 1.2–2.2)
Alkaline Phosphatase: 83 IU/L (ref 39–117)
BUN / CREAT RATIO: 23 (ref 12–28)
BUN: 32 mg/dL — AB (ref 8–27)
Bilirubin Total: 0.2 mg/dL (ref 0.0–1.2)
CO2: 25 mmol/L (ref 20–29)
CREATININE: 1.42 mg/dL — AB (ref 0.57–1.00)
Calcium: 10.2 mg/dL (ref 8.7–10.3)
Chloride: 96 mmol/L (ref 96–106)
GFR calc Af Amer: 46 mL/min/{1.73_m2} — ABNORMAL LOW (ref >59)
GFR calc non Af Amer: 40 mL/min/{1.73_m2} — ABNORMAL LOW (ref >59)
GLUCOSE: 110 mg/dL — AB (ref 65–99)
Globulin, Total: 2.7 g/dL (ref 1.5–4.5)
Potassium: 4.7 mmol/L (ref 3.5–5.2)
Sodium: 136 mmol/L (ref 134–144)
TOTAL PROTEIN: 7.1 g/dL (ref 6.0–8.5)

## 2017-10-04 LAB — CBC WITH DIFFERENTIAL/PLATELET
BASOS ABS: 0 10*3/uL (ref 0.0–0.2)
BASOS: 1 %
EOS (ABSOLUTE): 0.1 10*3/uL (ref 0.0–0.4)
Eos: 2 %
HEMOGLOBIN: 10.5 g/dL — AB (ref 11.1–15.9)
Hematocrit: 33.2 % — ABNORMAL LOW (ref 34.0–46.6)
IMMATURE GRANS (ABS): 0 10*3/uL (ref 0.0–0.1)
Immature Granulocytes: 1 %
Lymphocytes Absolute: 1.4 10*3/uL (ref 0.7–3.1)
Lymphs: 22 %
MCH: 27.8 pg (ref 26.6–33.0)
MCHC: 31.6 g/dL (ref 31.5–35.7)
MCV: 88 fL (ref 79–97)
Monocytes Absolute: 0.5 10*3/uL (ref 0.1–0.9)
Monocytes: 7 %
Neutrophils Absolute: 4.5 10*3/uL (ref 1.4–7.0)
Neutrophils: 67 %
Platelets: 337 10*3/uL (ref 150–450)
RBC: 3.78 x10E6/uL (ref 3.77–5.28)
RDW: 14.4 % (ref 12.3–15.4)
WBC: 6.6 10*3/uL (ref 3.4–10.8)

## 2017-10-04 LAB — LIPID PANEL
Chol/HDL Ratio: 4.7 ratio — ABNORMAL HIGH (ref 0.0–4.4)
Cholesterol, Total: 243 mg/dL — ABNORMAL HIGH (ref 100–199)
HDL: 52 mg/dL (ref >39)
LDL CALC: 151 mg/dL — AB (ref 0–99)
Triglycerides: 201 mg/dL — ABNORMAL HIGH (ref 0–149)
VLDL CHOLESTEROL CAL: 40 mg/dL (ref 5–40)

## 2017-10-04 LAB — TSH: TSH: 1.13 u[IU]/mL (ref 0.450–4.500)

## 2017-10-04 NOTE — Therapy (Signed)
[place], [place], [address] Phone: [phone]   Fax:  [phone]  Physical Therapy FCE Evaluation/ Discharge  Patient Details  Name: Heidi Walls MRN: [id number] Date of Birth: [birth year] Referring Provider (PT): Nicholas Lose, MD   Encounter Date: 10/04/2017  PT End of Session - 10/04/17 0932    Visit Number  1    Number of Visits  1    PT Start Time  0700    PT Stop Time  1025    PT Time Calculation (min)  205 min    Activity Tolerance  Patient limited by pain    Behavior During Therapy  [facility] for tasks assessed/performed;Anxious       Past Medical History:  Diagnosis Date  . Anemia   . Anxiety    OCD  . Arthritis    knees and hips  . Asthma   . Breast cancer ([place])   . Breast cancer of upper-inner quadrant of left female breast ([place]) 05/26/2015  . GERD (gastroesophageal reflux disease)   . Hypertension   . Insomnia     Past Surgical History:  Procedure Laterality Date  . BREAST RECONSTRUCTION WITH PLACEMENT OF TISSUE EXPANDER AND FLEX HD (ACELLULAR HYDRATED DERMIS) Left 06/24/2015   Procedure: LEFT BREAST RECONSTRUCTION WITH PLACEMENT OF TISSUE EXPANDER AND  ACELLULAR HYDRATED DERMIS (CORTIVA);  Surgeon: Irene Limbo, MD;  Location: [facility];  Service: Plastics;  Laterality: Left;  . KNEE SURGERY Right 2000   arthroscopy  . LIPOSUCTION WITH LIPOFILLING Left 12/23/2015   Procedure: LIPOFILLING TO LEFT CHEST FROM ABDOMEN  ;  Surgeon: Irene Limbo, MD;  Location: [facility];  Service: Plastics;  Laterality: Left;  Marland Kitchen MASTECTOMY W/ SENTINEL NODE BIOPSY Left 06/24/2015   Procedure: LEFT TOTAL MASTECTOMY WITH SENTINEL LYMPH NODE BIOPSY;  Surgeon: Excell Seltzer, MD;  Location: [facility];  Service: General;  Laterality: Left;  Marland Kitchen MASTOPEXY Right 12/23/2015   Procedure: RIGHT BREAST MASTOPEXY;  Surgeon: Irene Limbo, MD;  Location: [facility];  Service: Plastics;  Laterality: Right;  . MICROLARYNGOSCOPY W/VOCAL CORD INJECTION N/A 10/15/2016   Procedure: SUSPENDED DIRECT MICROLARYNGOSCOPY WITH VOCAL CORD INJECTION WITH JET VENTILATION;  Surgeon: Melida Quitter, MD;  Location: [facility];  Service: ENT;  Laterality: N/A;  . PORTACATH PLACEMENT Right 06/24/2015   Procedure: INSERTION PORT-A-CATH;  Surgeon: Excell Seltzer, MD;  Location: [facility];  Service: General;  Laterality: Right;  . portacath removal  09/05/2016  . REMOVAL OF BILATERAL TISSUE EXPANDERS WITH PLACEMENT OF BILATERAL BREAST IMPLANTS Left 12/23/2015   Procedure: REMOVAL OF LEFT  TISSUE EXPANDERS WITH PLACEMENT OF LEFT  BREAST IMPLANTS;  Surgeon: Irene Limbo, MD;  Location: [facility];  Service: Plastics;  Laterality: Left;  . TONSILLECTOMY      There were no vitals filed for this visit.       [facility] PT Assessment - 10/04/17 0001      Assessment   Medical Diagnosis  Lymphedema    Referring Provider (PT)  Nicholas Lose, MD    Onset Date/Surgical Date  --   2017     Precautions   Precautions  None      Restrictions   Weight Bearing Restrictions  No      Balance Screen   Has the patient fallen in the past 6 months  No      Cognition   Overall Cognitive Status  Within Functional Limits for  tasks assessed                Objective measurements completed on examination: See above findings.              PT Education - 10/04/17 0920    Education Details  Post FCE soreness for 2-4 days    stay active but no  excessive activity until soreness eases . Results of the  FCE were discussed with Heidi Walls) Educated  Patient    Methods  Explanation    Comprehension  Verbalized understanding                  Plan - 10/04/17 0934    Clinical Impression Statement  Heidi Walls  completed the FCE testing rated at      level for 8 hour work . This was the mionimum as she self  limtied greater than    frequency set by testing protocol.    PT Next Visit Plan  Discharge with report faxed to MD    Consulted and Agree with Plan of Care  Patient       Patient will benefit from skilled therapeutic intervention in order to improve the following deficits and impairments:     Visit Diagnosis: Postmastectomy lymphedema  Muscle weakness (generalized)     Problem List Patient Active Problem List   Diagnosis Date Noted  . Arthritis of knee, left 07/02/2017  . Arthritis of right knee 01/29/2017  . Baker's cyst of knee, left 01/02/2017  . Perennial allergic rhinitis with a probable nonallergic component 12/12/2016  . Neuropathy 11/19/2016  . Chronic pain 11/19/2016  . Moderate persistent asthma 07/10/2016  . Bronchitis 07/10/2016  . Chemotherapy induced diarrhea 10/17/2015  . Osteoporosis 09/07/2015  . Generalized anxiety disorder 09/07/2015  . Osteoarthritis of right knee 09/07/2015  . GERD (gastroesophageal reflux disease) 09/07/2015  . Depression 09/07/2015  . Irritability 09/07/2015  . Acquired absence of left breast and nipple 06/30/2015  . Breast cancer of upper-inner quadrant of left female breast ([place]) 05/26/2015    Darrel Hoover  PT 10/04/2017, 9:36 AM  [facility] [address] [place], [place], [address] Phone: [phone]   Fax:  [phone]  Name: Heidi Walls MRN: [id number] Date of Birth: [birth year] PHYSICAL THERAPY DISCHARGE SUMMARY  Visits from Start of Care: 1  Current functional level related to goals / functional outcomes: FCE results scanned into EPIC  Remaining deficits: See report   Education / Equipment: Stay active but decr strenuous activity until soreness subsides  Plan: Patient agrees to discharge.  Patient goals were not met. Patient is being discharged due to meeting the stated rehab goals.  ?????

## 2017-10-07 NOTE — Progress Notes (Signed)
BP 138/77   Pulse 84   Temp 97.8 F (36.6 C) (Oral)   Ht _0  (1.6 m)   Wt 164 lb (74.4 kg)   BMI 29.05 kg/m    Subjective:    Patient ID: Heidi Walls, female    DOB: 14-Aug-1955, 62 y.o.   MRN: [id number]  HPI: Heidi Walls is a 62 y.o. female presenting on 10/03/2017 for Medical Management of Chronic Issues  This patient comes in for periodic recheck on her chronic medical conditions.  They are positive for breast cancer history, neuropathy from medication, osteo-doses, allergy with asthma, chronic pain, generalized anxiety.  All of her medications are reviewed today.  We have updated her narcotic contract at this time.  She states she has not had any other issues.  She is still being followed by oncology.  She is trying to increase her physical activity.  She has joined a couple of support groups.  There are no other complaints at this time.  Pincus Large assessment: Cause of pain-  Pain on scale of 1-10- 8 Frequency- What increases pain-activity What makes pain Better-nothing Effects on ADL - moderate Any change in general medical condition-no  Current medications- hydrocodone, alprazolam Effectiveness of current meds-good Adverse reactions form pain meds-none  Pill count performed-No Urine drug screen- no Was the [place] reviewed- yes  If yes were their any concerning findings? - no  Past Medical History:  Diagnosis Date  . Anemia   . Anxiety    OCD  . Arthritis    knees and hips  . Asthma   . Breast cancer ([place])   . Breast cancer of upper-inner quadrant of left female breast ([place]) 05/26/2015  . GERD (gastroesophageal reflux disease)   . Hypertension   . Insomnia    Relevant past medical, surgical, family and social history reviewed and updated as indicated. Interim medical history since our last visit reviewed. Allergies and medications reviewed and updated. DATA REVIEWED: CHART IN EPIC  Family History reviewed for pertinent findings.  Review of Systems    Constitutional: Negative.   HENT: Negative.   Eyes: Negative.   Respiratory: Negative.   Gastrointestinal: Negative.   Genitourinary: Negative.   Musculoskeletal: Positive for arthralgias, joint swelling and myalgias.  Neurological: Positive for weakness.    Allergies as of 10/03/2017      Reactions   Codeine Shortness Of Breath   Pt reports this was 30 years ago.   Lisinopril Cough   Sulfamethoxazole       Medication List        Accurate as of 10/03/17 11:59 PM. Always use your most recent med list.          acetaminophen 325 MG tablet Commonly known as:  TYLENOL Take 650 mg by mouth daily.   Acidophilus Tabs Take by mouth.   albuterol 108 (90 Base) MCG/ACT inhaler Commonly known as:  PROVENTIL HFA;VENTOLIN HFA Inhale 2 puffs into the lungs every 4 (four) hours as needed for wheezing or shortness of breath.   alendronate 10 MG tablet Commonly known as:  FOSAMAX Take 1 tablet (10 mg total) by mouth daily before breakfast. Take with a full glass of water on an empty stomach.   ALPRAZolam 0.5 MG tablet Commonly known as:  XANAX Take 1 tablet (0.5 mg total) by mouth 3 (three) times daily as needed.   Azelastine HCl 0.15 % Soln One spray each nostril 1-2 times a day as needed   budesonide-formoterol 160-4.5 MCG/ACT inhaler  Commonly known as:  SYMBICORT TWO PUFFS WITH SPACER TWICE A DAY TO PREVENT COUGH OR WHEEZE.   CALCIUM 600 + D PO Take 1 tablet by mouth daily.   Carbinoxamine Maleate 4 MG Tabs TAKE 1 TABLET EVERY 8 HOURS AS NEEDED FOR RUNNY NOSE OR ITCHING.   cyclobenzaprine 10 MG tablet Commonly known as:  FLEXERIL Take 1 tablet (10 mg total) by mouth 3 (three) times daily as needed for muscle spasms.   diclofenac 75 MG EC tablet Commonly known as:  VOLTAREN TAKE 1 TABLET BY MOUTH TWICE A DAY   DULoxetine 60 MG capsule Commonly known as:  CYMBALTA TAKE 1 CAPSULE BY MOUTH EVERY DAY   esomeprazole 20 MG capsule Commonly known as:  NEXIUM Take 20  mg by mouth daily.   Fish Oil 1200 MG Caps Take 1,200 mg by mouth daily.   fluconazole 150 MG tablet Commonly known as:  DIFLUCAN 1 po q week x 4 weeks   FOLIC ACID PO Take 1 tablet by mouth daily.   gabapentin 300 MG capsule Commonly known as:  NEURONTIN TAKE 1-3 CAPSULES BY MOUTH 3 TIMES DAILY.   hydrochlorothiazide 25 MG tablet Commonly known as:  HYDRODIURIL Take 25 mg by mouth daily.   HYDROcodone-acetaminophen 10-325 MG tablet Commonly known as:  NORCO Take 1 tablet by mouth every 6 (six) hours as needed.   HYDROcodone-acetaminophen 10-325 MG tablet Commonly known as:  NORCO Take 1 tablet by mouth every 6 (six) hours as needed.   HYDROcodone-acetaminophen 10-325 MG tablet Commonly known as:  NORCO Take 1 tablet by mouth every 6 (six) hours as needed.   letrozole 2.5 MG tablet Commonly known as:  FEMARA Take 1 tablet (2.5 mg total) by mouth daily.   multivitamin tablet Take 1 tablet by mouth daily.   olmesartan-hydrochlorothiazide 20-12.5 MG tablet Commonly known as:  BENICAR HCT TAKE 1 TABLET BY MOUTH EVERY DAY   olopatadine 0.1 % ophthalmic solution Commonly known as:  PATANOL Place 1 drop into both eyes 2 (two) times daily.   ranitidine 150 MG tablet Commonly known as:  ZANTAC ONE TABLET TWICE DAILY AS NEEDED.   SUPER B COMPLEX PO Take 1 tablet by mouth daily.   valACYclovir 1000 MG tablet Commonly known as:  VALTREX Take 1,000 mg by mouth as needed.   vitamin C 1000 MG tablet Take 1,000 mg by mouth daily.          Objective:    BP 138/77   Pulse 84   Temp 97.8 F (36.6 C) (Oral)   Ht _0  (1.6 m)   Wt 164 lb (74.4 kg)   BMI 29.05 kg/m   Allergies  Allergen Reactions  . Codeine Shortness Of Breath    Pt reports this was 30 years ago.   Marland Kitchen Lisinopril Cough  . Sulfamethoxazole     Wt Readings from Last 3 Encounters:  10/03/17 164 lb (74.4 kg)  08/20/17 164 lb (74.4 kg)  07/02/17 164 lb 6.4 oz (74.6 kg)    Physical Exam   Constitutional: She is oriented to person, place, and time. She appears well-developed and well-nourished.  HENT:  Head: Normocephalic and atraumatic.  Eyes: Pupils are equal, round, and reactive to light. Conjunctivae and EOM are normal.  Cardiovascular: Normal rate, regular rhythm, normal heart sounds and intact distal pulses.  Pulmonary/Chest: Effort normal and breath sounds normal.  Abdominal: Soft. Bowel sounds are normal.  Neurological: She is alert and oriented to person, place, and time. She has  normal reflexes.  Skin: Skin is warm and dry. No rash noted.  Psychiatric: She has a normal mood and affect. Her behavior is normal. Judgment and thought content normal.    Results for orders placed or performed in visit on 10/03/17  CBC with Differential/Platelet  Result Value Ref Range   WBC 6.6 3.4 - 10.8 x10E3/uL   RBC 3.78 3.77 - 5.28 x10E6/uL   Hemoglobin 10.5 (L) 11.1 - 15.9 g/dL   Hematocrit 33.2 (L) 34.0 - 46.6 %   MCV 88 79 - 97 fL   MCH 27.8 26.6 - 33.0 pg   MCHC 31.6 31.5 - 35.7 g/dL   RDW 14.4 12.3 - 15.4 %   Platelets 337 150 - 450 x10E3/uL   Neutrophils 67 Not Estab. %   Lymphs 22 Not Estab. %   Monocytes 7 Not Estab. %   Eos 2 Not Estab. %   Basos 1 Not Estab. %   Neutrophils Absolute 4.5 1.4 - 7.0 x10E3/uL   Lymphocytes Absolute 1.4 0.7 - 3.1 x10E3/uL   Monocytes Absolute 0.5 0.1 - 0.9 x10E3/uL   EOS (ABSOLUTE) 0.1 0.0 - 0.4 x10E3/uL   Basophils Absolute 0.0 0.0 - 0.2 x10E3/uL   Immature Granulocytes 1 Not Estab. %   Immature Grans (Abs) 0.0 0.0 - 0.1 x10E3/uL  CMP14+EGFR  Result Value Ref Range   Glucose 110 (H) 65 - 99 mg/dL   BUN 32 (H) 8 - 27 mg/dL   Creatinine, Ser 1.42 (H) 0.57 - 1.00 mg/dL   GFR calc non Af Amer 40 (L) >59 mL/min/1.73   GFR calc Af Amer 46 (L) >59 mL/min/1.73   BUN/Creatinine Ratio 23 12 - 28   Sodium 136 134 - 144 mmol/L   Potassium 4.7 3.5 - 5.2 mmol/L   Chloride 96 96 - 106 mmol/L   CO2 25 20 - 29 mmol/L   Calcium 10.2 8.7 -  10.3 mg/dL   Total Protein 7.1 6.0 - 8.5 g/dL   Albumin 4.4 3.6 - 4.8 g/dL   Globulin, Total 2.7 1.5 - 4.5 g/dL   Albumin/Globulin Ratio 1.6 1.2 - 2.2   Bilirubin Total 0.2 0.0 - 1.2 mg/dL   Alkaline Phosphatase 83 39 - 117 IU/L   AST 24 0 - 40 IU/L   ALT 26 0 - 32 IU/L  Lipid panel  Result Value Ref Range   Cholesterol, Total 243 (H) 100 - 199 mg/dL   Triglycerides 201 (H) 0 - 149 mg/dL   HDL 52 >39 mg/dL   VLDL Cholesterol Cal 40 5 - 40 mg/dL   LDL Calculated 151 (H) 0 - 99 mg/dL   Chol/HDL Ratio 4.7 (H) 0.0 - 4.4 ratio  TSH  Result Value Ref Range   TSH 1.130 0.450 - 4.500 uIU/mL      Assessment & Plan:   1. Neuropathy - HYDROcodone-acetaminophen (NORCO) 10-325 MG tablet; Take 1 tablet by mouth every 6 (six) hours as needed.  Dispense: 60 tablet; Refill: 0 - CBC with Differential/Platelet - CMP14+EGFR - Lipid panel - TSH  2. Arthritis of knee, left - HYDROcodone-acetaminophen (NORCO) 10-325 MG tablet; Take 1 tablet by mouth every 6 (six) hours as needed.  Dispense: 60 tablet; Refill: 0  3. Generalized anxiety disorder - ALPRAZolam (XANAX) 0.5 MG tablet; Take 1 tablet (0.5 mg total) by mouth 3 (three) times daily as needed.  Dispense: 90 tablet; Refill: 5  4. Chronic pain due to neoplasm - CBC with Differential/Platelet - CMP14+EGFR - Lipid panel - TSH  5. Need for immunization against influenza - Flu Vaccine QUAD 36+ mos IM   Continue all other maintenance medications as listed above.  Follow up plan: Return in about 3 months (around 01/02/2018) for recheck.  Educational handout given for [place] PA-C [place] [address]  [place], [place] [address] [phone]   10/07/2017, 2:33 PM

## 2017-10-08 ENCOUNTER — Telehealth: Payer: Self-pay

## 2017-10-08 NOTE — Telephone Encounter (Signed)
Left message with patient informing that worker's compensation paperwork has been completed and faxed to Charmayne Sheer, [facility] @ [phone].  Contact information left for any additional needs.

## 2017-10-09 ENCOUNTER — Other Ambulatory Visit: Payer: Self-pay

## 2017-10-09 DIAGNOSIS — N289 Disorder of kidney and ureter, unspecified: Secondary | ICD-10-CM

## 2017-10-22 ENCOUNTER — Other Ambulatory Visit: Payer: Self-pay | Admitting: Hematology and Oncology

## 2017-10-25 ENCOUNTER — Encounter: Payer: Self-pay | Admitting: *Deleted

## 2017-10-26 DIAGNOSIS — M84374A Stress fracture, right foot, initial encounter for fracture: Secondary | ICD-10-CM | POA: Diagnosis not present

## 2017-10-26 DIAGNOSIS — M79671 Pain in right foot: Secondary | ICD-10-CM | POA: Diagnosis not present

## 2017-10-30 ENCOUNTER — Telehealth: Payer: Self-pay | Admitting: Physician Assistant

## 2017-10-30 NOTE — Telephone Encounter (Signed)
Patient aware.

## 2017-10-30 NOTE — Telephone Encounter (Signed)
noted 

## 2017-11-04 ENCOUNTER — Other Ambulatory Visit: Payer: BLUE CROSS/BLUE SHIELD

## 2017-11-04 DIAGNOSIS — N289 Disorder of kidney and ureter, unspecified: Secondary | ICD-10-CM | POA: Diagnosis not present

## 2017-11-04 LAB — CMP14+EGFR
ALT: 32 IU/L (ref 0–32)
AST: 51 IU/L — AB (ref 0–40)
Albumin/Globulin Ratio: 1.9 (ref 1.2–2.2)
Albumin: 4.4 g/dL (ref 3.6–4.8)
Alkaline Phosphatase: 80 IU/L (ref 39–117)
BUN/Creatinine Ratio: 29 — ABNORMAL HIGH (ref 12–28)
BUN: 46 mg/dL — AB (ref 8–27)
CALCIUM: 9.3 mg/dL (ref 8.7–10.3)
CO2: 22 mmol/L (ref 20–29)
Chloride: 98 mmol/L (ref 96–106)
Creatinine, Ser: 1.57 mg/dL — ABNORMAL HIGH (ref 0.57–1.00)
GFR calc Af Amer: 41 mL/min/{1.73_m2} — ABNORMAL LOW (ref >59)
GFR calc non Af Amer: 35 mL/min/{1.73_m2} — ABNORMAL LOW (ref >59)
GLUCOSE: 105 mg/dL — AB (ref 65–99)
Globulin, Total: 2.3 g/dL (ref 1.5–4.5)
Potassium: 5.2 mmol/L (ref 3.5–5.2)
Sodium: 134 mmol/L (ref 134–144)
TOTAL PROTEIN: 6.7 g/dL (ref 6.0–8.5)

## 2017-11-06 ENCOUNTER — Telehealth: Payer: Self-pay | Admitting: Physician Assistant

## 2017-11-06 NOTE — Telephone Encounter (Signed)
Pt aware.

## 2017-11-11 ENCOUNTER — Telehealth: Payer: Self-pay | Admitting: Physician Assistant

## 2017-11-11 NOTE — Telephone Encounter (Signed)
Patient notified and she will continue with the brace and return here for follow up and xray

## 2017-11-11 NOTE — Telephone Encounter (Signed)
PT recently brought in xray results from 10/19-stress fracture, to [facility] just called this morning, pt is wanting to know if AJ thinks she should go to ortho or would wearing the boot be just as good.

## 2017-11-11 NOTE — Telephone Encounter (Signed)
Wear brace 4-6 weeks and can make appointment here for recheck and xray. Or she can go to ortho if she chooses

## 2017-11-11 NOTE — Telephone Encounter (Signed)
Please review and advise.

## 2017-11-17 ENCOUNTER — Other Ambulatory Visit: Payer: Self-pay | Admitting: Physician Assistant

## 2017-11-17 DIAGNOSIS — G893 Neoplasm related pain (acute) (chronic): Secondary | ICD-10-CM

## 2017-11-20 ENCOUNTER — Telehealth: Payer: Self-pay | Admitting: Physician Assistant

## 2017-11-20 ENCOUNTER — Other Ambulatory Visit: Payer: Self-pay | Admitting: Physician Assistant

## 2017-11-20 MED ORDER — TRAMADOL HCL 50 MG PO TABS: 50.0000 mg | ORAL_TABLET | Freq: Three times a day (TID) | ORAL | 0 refills | Status: DC | PRN

## 2017-11-20 NOTE — Telephone Encounter (Signed)
sent 

## 2017-11-20 NOTE — Telephone Encounter (Signed)
Patient anxious to begin any medicine that will give her relief.  Please send ultram in.

## 2017-11-20 NOTE — Telephone Encounter (Signed)
What else could she have for pain?

## 2017-11-20 NOTE — Telephone Encounter (Signed)
I recommend trying Ultram, which is a very low range controlled medication.  It will not affect her kidneys.  That is why the Mobic was stopped because her kidney tests have been going higher.  If that is okay I will send the medication in.

## 2017-12-11 ENCOUNTER — Other Ambulatory Visit: Payer: Self-pay | Admitting: Physician Assistant

## 2017-12-11 DIAGNOSIS — F331 Major depressive disorder, recurrent, moderate: Secondary | ICD-10-CM

## 2017-12-11 DIAGNOSIS — G629 Polyneuropathy, unspecified: Secondary | ICD-10-CM

## 2017-12-19 ENCOUNTER — Telehealth: Payer: Self-pay | Admitting: Physician Assistant

## 2017-12-19 NOTE — Telephone Encounter (Signed)
Advised pt she would ntbs for any pain medication per office protocol. The call dropped.

## 2017-12-23 ENCOUNTER — Other Ambulatory Visit: Payer: Self-pay | Admitting: Physician Assistant

## 2017-12-23 MED ORDER — TRAMADOL HCL 50 MG PO TABS: 50.0000 mg | ORAL_TABLET | Freq: Four times a day (QID) | ORAL | 2 refills | Status: DC | PRN

## 2017-12-24 ENCOUNTER — Other Ambulatory Visit: Payer: Self-pay | Admitting: Allergy and Immunology

## 2017-12-24 DIAGNOSIS — K219 Gastro-esophageal reflux disease without esophagitis: Secondary | ICD-10-CM

## 2017-12-25 ENCOUNTER — Other Ambulatory Visit: Payer: Self-pay | Admitting: Allergy and Immunology

## 2018-01-06 ENCOUNTER — Ambulatory Visit: Payer: BLUE CROSS/BLUE SHIELD | Admitting: Physician Assistant

## 2018-01-13 ENCOUNTER — Encounter: Payer: Self-pay | Admitting: Physician Assistant

## 2018-01-13 ENCOUNTER — Ambulatory Visit: Payer: PRIVATE HEALTH INSURANCE | Admitting: Physician Assistant

## 2018-01-13 VITALS — BP 149/81 | HR 91 | Temp 99.1°F | Ht 63.0 in | Wt 159.4 lb

## 2018-01-13 DIAGNOSIS — C50212 Malignant neoplasm of upper-inner quadrant of left female breast: Secondary | ICD-10-CM | POA: Diagnosis not present

## 2018-01-13 DIAGNOSIS — G893 Neoplasm related pain (acute) (chronic): Secondary | ICD-10-CM | POA: Diagnosis not present

## 2018-01-13 DIAGNOSIS — E7800 Pure hypercholesterolemia, unspecified: Secondary | ICD-10-CM

## 2018-01-13 DIAGNOSIS — K219 Gastro-esophageal reflux disease without esophagitis: Secondary | ICD-10-CM

## 2018-01-13 DIAGNOSIS — N289 Disorder of kidney and ureter, unspecified: Secondary | ICD-10-CM | POA: Diagnosis not present

## 2018-01-13 DIAGNOSIS — M21961 Unspecified acquired deformity of right lower leg: Secondary | ICD-10-CM

## 2018-01-13 DIAGNOSIS — G629 Polyneuropathy, unspecified: Secondary | ICD-10-CM

## 2018-01-13 DIAGNOSIS — Z170 Estrogen receptor positive status [ER+]: Secondary | ICD-10-CM

## 2018-01-13 DIAGNOSIS — F331 Major depressive disorder, recurrent, moderate: Secondary | ICD-10-CM

## 2018-01-13 DIAGNOSIS — M1712 Unilateral primary osteoarthritis, left knee: Secondary | ICD-10-CM

## 2018-01-13 LAB — BAYER DCA HB A1C WAIVED: HB A1C (BAYER DCA - WAIVED): 6 % (ref <7.0)

## 2018-01-13 MED ORDER — DULOXETINE HCL 60 MG PO CPEP: 60.0000 mg | ORAL_CAPSULE | Freq: Every day | ORAL | 3 refills | Status: DC

## 2018-01-13 MED ORDER — HYDROCODONE-ACETAMINOPHEN 10-325 MG PO TABS: 1.0000 | ORAL_TABLET | Freq: Four times a day (QID) | ORAL | 0 refills | Status: DC | PRN

## 2018-01-13 MED ORDER — OLMESARTAN MEDOXOMIL-HCTZ 20-12.5 MG PO TABS: 1.0000 | ORAL_TABLET | Freq: Every day | ORAL | 3 refills | Status: DC

## 2018-01-14 ENCOUNTER — Telehealth: Payer: Self-pay | Admitting: Physician Assistant

## 2018-01-14 DIAGNOSIS — G629 Polyneuropathy, unspecified: Secondary | ICD-10-CM

## 2018-01-14 DIAGNOSIS — F331 Major depressive disorder, recurrent, moderate: Secondary | ICD-10-CM

## 2018-01-14 LAB — CBC WITH DIFFERENTIAL/PLATELET
Basophils Absolute: 0.1 10*3/uL (ref 0.0–0.2)
Basos: 1 %
EOS (ABSOLUTE): 0.2 10*3/uL (ref 0.0–0.4)
Eos: 3 %
Hematocrit: 32.7 % — ABNORMAL LOW (ref 34.0–46.6)
Hemoglobin: 10.6 g/dL — ABNORMAL LOW (ref 11.1–15.9)
IMMATURE GRANULOCYTES: 0 %
Immature Grans (Abs): 0 10*3/uL (ref 0.0–0.1)
LYMPHS ABS: 1.4 10*3/uL (ref 0.7–3.1)
Lymphs: 21 %
MCH: 27.4 pg (ref 26.6–33.0)
MCHC: 32.4 g/dL (ref 31.5–35.7)
MCV: 85 fL (ref 79–97)
MONOS ABS: 0.5 10*3/uL (ref 0.1–0.9)
Monocytes: 7 %
NEUTROS PCT: 68 %
Neutrophils Absolute: 4.4 10*3/uL (ref 1.4–7.0)
PLATELETS: 375 10*3/uL (ref 150–450)
RBC: 3.87 x10E6/uL (ref 3.77–5.28)
RDW: 13 % (ref 11.7–15.4)
WBC: 6.5 10*3/uL (ref 3.4–10.8)

## 2018-01-14 LAB — LIPID PANEL
CHOL/HDL RATIO: 3.6 ratio (ref 0.0–4.4)
Cholesterol, Total: 203 mg/dL — ABNORMAL HIGH (ref 100–199)
HDL: 56 mg/dL (ref >39)
LDL CALC: 118 mg/dL — AB (ref 0–99)
TRIGLYCERIDES: 147 mg/dL (ref 0–149)
VLDL Cholesterol Cal: 29 mg/dL (ref 5–40)

## 2018-01-14 LAB — CMP14+EGFR
ALT: 19 IU/L (ref 0–32)
AST: 18 IU/L (ref 0–40)
Albumin/Globulin Ratio: 1.7 (ref 1.2–2.2)
Albumin: 4.4 g/dL (ref 3.6–4.8)
Alkaline Phosphatase: 93 IU/L (ref 39–117)
BUN / CREAT RATIO: 17 (ref 12–28)
BUN: 20 mg/dL (ref 8–27)
Bilirubin Total: <0.2 mg/dL (ref 0.0–1.2)
CALCIUM: 10.4 mg/dL — AB (ref 8.7–10.3)
CO2: 25 mmol/L (ref 20–29)
CREATININE: 1.21 mg/dL — AB (ref 0.57–1.00)
Chloride: 98 mmol/L (ref 96–106)
GFR, EST AFRICAN AMERICAN: 55 mL/min/{1.73_m2} — AB (ref >59)
GFR, EST NON AFRICAN AMERICAN: 48 mL/min/{1.73_m2} — AB (ref >59)
Globulin, Total: 2.6 g/dL (ref 1.5–4.5)
Glucose: 110 mg/dL — ABNORMAL HIGH (ref 65–99)
Potassium: 4.4 mmol/L (ref 3.5–5.2)
SODIUM: 136 mmol/L (ref 134–144)
Total Protein: 7 g/dL (ref 6.0–8.5)

## 2018-01-14 LAB — TSH: TSH: 0.784 u[IU]/mL (ref 0.450–4.500)

## 2018-01-14 MED ORDER — OLMESARTAN MEDOXOMIL-HCTZ 20-12.5 MG PO TABS: 1.0000 | ORAL_TABLET | Freq: Every day | ORAL | 3 refills | Status: DC

## 2018-01-14 MED ORDER — DULOXETINE HCL 60 MG PO CPEP: 60.0000 mg | ORAL_CAPSULE | Freq: Every day | ORAL | 3 refills | Status: DC

## 2018-01-14 NOTE — Telephone Encounter (Signed)
Cymbalta and Olmesartan sent to mail order.  Patient aware

## 2018-01-15 ENCOUNTER — Other Ambulatory Visit: Payer: Self-pay | Admitting: Hematology and Oncology

## 2018-01-15 DIAGNOSIS — E7800 Pure hypercholesterolemia, unspecified: Secondary | ICD-10-CM | POA: Insufficient documentation

## 2018-01-15 DIAGNOSIS — N289 Disorder of kidney and ureter, unspecified: Secondary | ICD-10-CM | POA: Insufficient documentation

## 2018-01-15 NOTE — Progress Notes (Signed)
BP (!) 149/81   Pulse 91   Temp 99.1 F (37.3 C) (Oral)   Ht '5\' 3"'$  (1.6 m)   Wt 159 lb 6.4 oz (72.3 kg)   BMI 28.24 kg/m    Subjective:    Patient ID: Heidi Walls, female    DOB: 1955/01/31, 63 y.o.   MRN: [id number]  HPI: Heidi Walls is a 62 y.o. female presenting on 01/13/2018 for Depression (3 month follow up ); Foot Pain (right foot and has no feeling); Numbness (right 1st and 2nd digit); Back Pain (low back and radiates down right leg ); and Shoulder Pain (right )  This patient comes in for periodic recheck on medications and conditions including chronic halothane secondary to chemotherapy from breast neoplasm, breast cancer remission, GERD, elevated cholesterol, arthritis, depression, neuropathy, abnormal kidney function.  She was stopped on her anti-inflammatory last month.  It made a great difference in the amount of pain she was experiencing due to her arthritis.  However we had a long discussion about the damage it can do to kidneys so she is going to stop stay off of it as much as possible.  She is continued with significant pain in the right foot and some deformity is happening there.  It is even hurting under her foot and on top.  She denies any specific injury to the foot.  We will make podiatry referral for this foot..   All medications are reviewed today. There are no reports of any problems with the medications. All of the medical conditions are reviewed and updated.  Lab work is reviewed and will be ordered as medically necessary. There are no new problems reported with today's visit.   Past Medical History:  Diagnosis Date  . Anemia   . Anxiety    OCD  . Arthritis    knees and hips  . Asthma   . Breast cancer ([place])   . Breast cancer of upper-inner quadrant of left female breast ([place]) 05/26/2015  . GERD (gastroesophageal reflux disease)   . Hypertension   . Insomnia    Relevant past medical, surgical, family and social history reviewed and updated as  indicated. Interim medical history since our last visit reviewed. Allergies and medications reviewed and updated. DATA REVIEWED: CHART IN EPIC  Family History reviewed for pertinent findings.  Review of Systems  Constitutional: Negative.  Negative for activity change, fatigue and fever.  HENT: Negative.   Eyes: Negative.   Respiratory: Negative.  Negative for cough.   Cardiovascular: Negative.  Negative for chest pain.  Gastrointestinal: Negative.  Negative for abdominal pain.  Endocrine: Negative.   Genitourinary: Negative.  Negative for dysuria.  Musculoskeletal: Positive for arthralgias, back pain, gait problem, joint swelling and myalgias.  Skin: Negative.   Neurological: Positive for weakness.  Psychiatric/Behavioral: Positive for decreased concentration. The patient is nervous/anxious.     Allergies as of 01/13/2018      Reactions   Codeine Shortness Of Breath   Pt reports this was 30 years ago.   Lisinopril Cough   Sulfamethoxazole       Medication List       Accurate as of January 13, 2018 11:59 PM. Always use your most recent med list.        acetaminophen 325 MG tablet Commonly known as:  TYLENOL Take 650 mg by mouth daily.   Acidophilus Tabs Take by mouth.   albuterol 108 (90 Base) MCG/ACT inhaler Commonly known as:  PROAIR HFA Inhale 2  puffs into the lungs every 4 (four) hours as needed for wheezing or shortness of breath.   alendronate 70 MG tablet Commonly known as:  FOSAMAX TAKE 1 TABLET BY MOUTH ONCE WEEKLY WITH A FULL GLASS OF WATER ON AN EMPTY STOMACH   ALPRAZolam 0.5 MG tablet Commonly known as:  XANAX Take 1 tablet (0.5 mg total) by mouth 3 (three) times daily as needed.   Azelastine HCl 0.15 % Soln One spray each nostril 1-2 times a day as needed   budesonide-formoterol 160-4.5 MCG/ACT inhaler Commonly known as:  SYMBICORT TWO PUFFS WITH SPACER TWICE A DAY TO PREVENT COUGH OR WHEEZE.   CALCIUM 600 + D PO Take 1 tablet by mouth daily.     Carbinoxamine Maleate 4 MG Tabs TAKE 1 TABLET EVERY 8 HOURS AS NEEDED FOR RUNNY NOSE OR ITCHING.   DULoxetine 60 MG capsule Commonly known as:  CYMBALTA Take 1 capsule (60 mg total) by mouth daily.   esomeprazole 20 MG capsule Commonly known as:  NEXIUM Take 20 mg by mouth daily.   Fish Oil 1200 MG Caps Take 1,200 mg by mouth daily.   fluconazole 150 MG tablet Commonly known as:  DIFLUCAN 1 po q week x 4 weeks   FOLIC ACID PO Take 1 tablet by mouth daily.   gabapentin 300 MG capsule Commonly known as:  NEURONTIN TAKE 1-3 CAPSULES BY MOUTH 3 TIMES DAILY.   HYDROcodone-acetaminophen 10-325 MG tablet Commonly known as:  NORCO Take 1 tablet by mouth every 6 (six) hours as needed.   HYDROcodone-acetaminophen 10-325 MG tablet Commonly known as:  NORCO Take 1 tablet by mouth every 6 (six) hours as needed.   HYDROcodone-acetaminophen 10-325 MG tablet Commonly known as:  NORCO Take 1 tablet by mouth every 6 (six) hours as needed.   letrozole 2.5 MG tablet Commonly known as:  FEMARA Take 1 tablet (2.5 mg total) by mouth daily.   multivitamin tablet Take 1 tablet by mouth daily.   olmesartan-hydrochlorothiazide 20-12.5 MG tablet Commonly known as:  BENICAR HCT Take 1 tablet by mouth daily.   olopatadine 0.1 % ophthalmic solution Commonly known as:  PATANOL Place 1 drop into both eyes 2 (two) times daily.   ranitidine 150 MG tablet Commonly known as:  ZANTAC ONE TABLET TWICE DAILY AS NEEDED.   SUPER B COMPLEX PO Take 1 tablet by mouth daily.   traMADol 50 MG tablet Commonly known as:  ULTRAM Take 1 tablet (50 mg total) by mouth every 6 (six) hours as needed for severe pain.   valACYclovir 1000 MG tablet Commonly known as:  VALTREX Take 1,000 mg by mouth as needed.          Objective:    BP (!) 149/81   Pulse 91   Temp 99.1 F (37.3 C) (Oral)   Ht '5\' 3"'$  (1.6 m)   Wt 159 lb 6.4 oz (72.3 kg)   BMI 28.24 kg/m   Allergies  Allergen Reactions  .  Codeine Shortness Of Breath    Pt reports this was 30 years ago.   Marland Kitchen Lisinopril Cough  . Sulfamethoxazole     Wt Readings from Last 3 Encounters:  01/13/18 159 lb 6.4 oz (72.3 kg)  10/03/17 164 lb (74.4 kg)  08/20/17 164 lb (74.4 kg)    Physical Exam Constitutional:      Appearance: She is well-developed.  HENT:     Head: Normocephalic and atraumatic.     Right Ear: Tympanic membrane, ear canal and external  ear normal.     Left Ear: Tympanic membrane, ear canal and external ear normal.     Nose: Nose normal. No rhinorrhea.     Mouth/Throat:     Pharynx: No oropharyngeal exudate or posterior oropharyngeal erythema.  Eyes:     Conjunctiva/sclera: Conjunctivae normal.     Pupils: Pupils are equal, round, and reactive to light.  Neck:     Musculoskeletal: Normal range of motion and neck supple.  Cardiovascular:     Rate and Rhythm: Normal rate and regular rhythm.     Heart sounds: Normal heart sounds.  Pulmonary:     Effort: Pulmonary effort is normal.     Breath sounds: Normal breath sounds.  Abdominal:     General: Bowel sounds are normal.     Palpations: Abdomen is soft.  Musculoskeletal:     Right ankle: She exhibits deformity. Tenderness.       Feet:  Skin:    General: Skin is warm and dry.     Findings: No rash.  Neurological:     Mental Status: She is alert and oriented to person, place, and time.     Deep Tendon Reflexes: Reflexes are normal and symmetric.  Psychiatric:        Behavior: Behavior normal.        Thought Content: Thought content normal.        Judgment: Judgment normal.     Results for orders placed or performed in visit on 01/13/18  CBC with Differential/Platelet  Result Value Ref Range   WBC 6.5 3.4 - 10.8 x10E3/uL   RBC 3.87 3.77 - 5.28 x10E6/uL   Hemoglobin 10.6 (L) 11.1 - 15.9 g/dL   Hematocrit 32.7 (L) 34.0 - 46.6 %   MCV 85 79 - 97 fL   MCH 27.4 26.6 - 33.0 pg   MCHC 32.4 31.5 - 35.7 g/dL   RDW 13.0 11.7 - 15.4 %   Platelets 375  150 - 450 x10E3/uL   Neutrophils 68 Not Estab. %   Lymphs 21 Not Estab. %   Monocytes 7 Not Estab. %   Eos 3 Not Estab. %   Basos 1 Not Estab. %   Neutrophils Absolute 4.4 1.4 - 7.0 x10E3/uL   Lymphocytes Absolute 1.4 0.7 - 3.1 x10E3/uL   Monocytes Absolute 0.5 0.1 - 0.9 x10E3/uL   EOS (ABSOLUTE) 0.2 0.0 - 0.4 x10E3/uL   Basophils Absolute 0.1 0.0 - 0.2 x10E3/uL   Immature Granulocytes 0 Not Estab. %   Immature Grans (Abs) 0.0 0.0 - 0.1 x10E3/uL  CMP14+EGFR  Result Value Ref Range   Glucose 110 (H) 65 - 99 mg/dL   BUN 20 8 - 27 mg/dL   Creatinine, Ser 1.21 (H) 0.57 - 1.00 mg/dL   GFR calc non Af Amer 48 (L) >59 mL/min/1.73   GFR calc Af Amer 55 (L) >59 mL/min/1.73   BUN/Creatinine Ratio 17 12 - 28   Sodium 136 134 - 144 mmol/L   Potassium 4.4 3.5 - 5.2 mmol/L   Chloride 98 96 - 106 mmol/L   CO2 25 20 - 29 mmol/L   Calcium 10.4 (H) 8.7 - 10.3 mg/dL   Total Protein 7.0 6.0 - 8.5 g/dL   Albumin 4.4 3.6 - 4.8 g/dL   Globulin, Total 2.6 1.5 - 4.5 g/dL   Albumin/Globulin Ratio 1.7 1.2 - 2.2   Bilirubin Total <0.2 0.0 - 1.2 mg/dL   Alkaline Phosphatase 93 39 - 117 IU/L   AST 18 0 - 40  IU/L   ALT 19 0 - 32 IU/L  Lipid panel  Result Value Ref Range   Cholesterol, Total 203 (H) 100 - 199 mg/dL   Triglycerides 147 0 - 149 mg/dL   HDL 56 >39 mg/dL   VLDL Cholesterol Cal 29 5 - 40 mg/dL   LDL Calculated 118 (H) 0 - 99 mg/dL   Chol/HDL Ratio 3.6 0.0 - 4.4 ratio  Bayer DCA Hb A1c Waived  Result Value Ref Range   HB A1C (BAYER DCA - WAIVED) 6.0 <7.0 %  TSH  Result Value Ref Range   TSH 0.784 0.450 - 4.500 uIU/mL      Assessment & Plan:   1. Abnormal kidney functio - CMP14+EGFR - TSH  2. Chronic pain due to neoplasm Continue medications  3. Malignant neoplasm of upper-inner quadrant of left breast in female, estrogen receptor positive ([place]) - CBC with Differential/Platelet - CMP14+EGFR - Lipid panel - Bayer DCA Hb A1c Waived - TSH  4. Gastroesophageal reflux disease  without esophagitis - CBC with Differential/Platelet  5. Elevated cholesterol - CMP14+EGFR - Lipid panel - Bayer DCA Hb A1c Waived - TSH  6. Neuropathy - HYDROcodone-acetaminophen (NORCO) 10-325 MG tablet; Take 1 tablet by mouth every 6 (six) hours as needed.  Dispense: 90 tablet; Refill: 0 - HYDROcodone-acetaminophen (NORCO) 10-325 MG tablet; Take 1 tablet by mouth every 6 (six) hours as needed.  Dispense: 90 tablet; Refill: 0 - HYDROcodone-acetaminophen (NORCO) 10-325 MG tablet; Take 1 tablet by mouth every 6 (six) hours as needed.  Dispense: 90 tablet; Refill: 0  7. Arthritsurveyis of knee, left - HYDROcodone-acetaminophen (NORCO) 10-325 MG tablet; Take 1 tablet by mouth every 6 (six) hours as needed.  Dispense: 90 tablet; Refill: 0 - HYDROcodone-acetaminophen (NORCO) 10-325 MG tablet; Take 1 tablet by mouth every 6 (six) hours as needed.  Dispense: 90 tablet; Refill: 0 - HYDROcodone-acetaminophen (NORCO) 10-325 MG tablet; Take 1 tablet by mouth every 6 (six) hours as needed.  Dispense: 90 tablet; Refill: 0  8. Moderate episode of recurrent major depressive disorder ([place]   Continue all other maintenance medications as listed above.  Follow up plan: Return in about 3 months (around 04/14/2018) for recheck.  Educational handout given for [place] PA-C [place] [address]  [place], [place] [address] [phone]   01/15/2018, 1:56 PM

## 2018-01-20 ENCOUNTER — Other Ambulatory Visit: Payer: Self-pay | Admitting: Allergy and Immunology

## 2018-01-23 ENCOUNTER — Ambulatory Visit (INDEPENDENT_AMBULATORY_CARE_PROVIDER_SITE_OTHER): Payer: PRIVATE HEALTH INSURANCE

## 2018-01-23 ENCOUNTER — Encounter: Payer: Self-pay | Admitting: Podiatry

## 2018-01-23 ENCOUNTER — Ambulatory Visit (INDEPENDENT_AMBULATORY_CARE_PROVIDER_SITE_OTHER): Payer: PRIVATE HEALTH INSURANCE | Admitting: Podiatry

## 2018-01-23 VITALS — BP 153/76 | HR 79

## 2018-01-23 DIAGNOSIS — S92321A Displaced fracture of second metatarsal bone, right foot, initial encounter for closed fracture: Secondary | ICD-10-CM

## 2018-01-23 DIAGNOSIS — S92901G Unspecified fracture of right foot, subsequent encounter for fracture with delayed healing: Secondary | ICD-10-CM | POA: Diagnosis not present

## 2018-01-23 DIAGNOSIS — G5762 Lesion of plantar nerve, left lower limb: Secondary | ICD-10-CM

## 2018-01-23 DIAGNOSIS — M79672 Pain in left foot: Secondary | ICD-10-CM

## 2018-01-23 DIAGNOSIS — M79671 Pain in right foot: Secondary | ICD-10-CM

## 2018-01-24 ENCOUNTER — Telehealth: Payer: Self-pay | Admitting: *Deleted

## 2018-01-24 NOTE — Telephone Encounter (Signed)
Entered in error

## 2018-01-26 NOTE — Progress Notes (Signed)
Subjective:  Patient ID: Heidi Walls, female    DOB: Nov 04, 1955,  MRN: [id number]  Chief Complaint  Patient presents with  . Foot Pain    bilateral top and bottom/ball of foot/medial of great toes; "always had problems with feet; getting worse in the last 54mo; had stress fracture in R back in October; was in a boot for about 73mo"    63 y.o. female presents with the above complaint.  Had chemo from May 17 2/18 states that she has had problems with her feet since then having pain in both feet worse on the right than the left pain to the ball of both feet was told that she had a stress fracture on her right foot back in October but she was not told which area it was having a fracture  Review of Systems: Negative except as noted in the HPI. Denies N/V/F/Ch.  Past Medical History:  Diagnosis Date  . Anemia   . Anxiety    OCD  . Arthritis    knees and hips  . Asthma   . Breast cancer ([place])   . Breast cancer of upper-inner quadrant of left female breast ([place]) 05/26/2015  . GERD (gastroesophageal reflux disease)   . Hypertension   . Insomnia     Current Outpatient Medications:  .  acetaminophen (TYLENOL) 325 MG tablet, Take 650 mg by mouth daily., Disp: , Rfl:  .  albuterol (PROAIR HFA) 108 (90 Base) MCG/ACT inhaler, Inhale 2 puffs into the lungs every 4 (four) hours as needed for wheezing or shortness of breath., Disp: 1 Inhaler, Rfl: 2 .  alendronate (FOSAMAX) 70 MG tablet, TAKE 1 TABLET BY MOUTH ONCE WEEKLY WITH A FULL GLASS OF WATER ON AN EMPTY STOMACH, Disp: 12 tablet, Rfl: 0 .  ALPRAZolam (XANAX) 0.5 MG tablet, Take 1 tablet (0.5 mg total) by mouth 3 (three) times daily as needed., Disp: 90 tablet, Rfl: 5 .  Azelastine HCl 0.15 % SOLN, One spray each nostril 1-2 times a day as needed, Disp: 30 mL, Rfl: 5 .  B Complex-C (SUPER B COMPLEX PO), Take 1 tablet by mouth daily., Disp: , Rfl:  .  budesonide-formoterol (SYMBICORT) 160-4.5 MCG/ACT inhaler, TWO PUFFS WITH SPACER TWICE A DAY  TO PREVENT COUGH OR WHEEZE., Disp: 30.6 Inhaler, Rfl: 0 .  Calcium Carb-Cholecalciferol (CALCIUM 600 + D PO), Take 1 tablet by mouth daily., Disp: , Rfl:  .  Carbinoxamine Maleate 4 MG TABS, TAKE 1 TABLET EVERY 8 HOURS AS NEEDED FOR RUNNY NOSE OR ITCHING., Disp: 270 tablet, Rfl: 1 .  DULoxetine (CYMBALTA) 60 MG capsule, Take 1 capsule (60 mg total) by mouth daily., Disp: 90 capsule, Rfl: 3 .  esomeprazole (NEXIUM) 20 MG capsule, Take 20 mg by mouth daily. , Disp: , Rfl:  .  fluconazole (DIFLUCAN) 150 MG tablet, 1 po q week x 4 weeks, Disp: 4 tablet, Rfl: 11 .  FOLIC ACID PO, Take 1 tablet by mouth daily., Disp: , Rfl:  .  gabapentin (NEURONTIN) 300 MG capsule, TAKE 1-3 CAPSULES BY MOUTH 3 TIMES DAILY., Disp: 810 capsule, Rfl: 0 .  HYDROcodone-acetaminophen (NORCO) 10-325 MG tablet, Take 1 tablet by mouth every 6 (six) hours as needed., Disp: 90 tablet, Rfl: 0 .  HYDROcodone-acetaminophen (NORCO) 10-325 MG tablet, Take 1 tablet by mouth every 6 (six) hours as needed., Disp: 90 tablet, Rfl: 0 .  HYDROcodone-acetaminophen (NORCO) 10-325 MG tablet, Take 1 tablet by mouth every 6 (six) hours as needed., Disp: 90 tablet, Rfl:  0 .  Lactobacillus (ACIDOPHILUS) TABS, Take by mouth., Disp: , Rfl:  .  letrozole (FEMARA) 2.5 MG tablet, Take 1 tablet (2.5 mg total) by mouth daily., Disp: 90 tablet, Rfl: 3 .  Multiple Vitamin (MULTIVITAMIN) tablet, Take 1 tablet by mouth daily., Disp: , Rfl:  .  olmesartan-hydrochlorothiazide (BENICAR HCT) 20-12.5 MG tablet, Take 1 tablet by mouth daily., Disp: 90 tablet, Rfl: 3 .  olopatadine (PATANOL) 0.1 % ophthalmic solution, Place 1 drop into both eyes 2 (two) times daily., Disp: 5 mL, Rfl: 12 .  Omega-3 Fatty Acids (FISH OIL) 1200 MG CAPS, Take 1,200 mg by mouth daily., Disp: , Rfl:  .  ranitidine (ZANTAC) 150 MG tablet, ONE TABLET TWICE DAILY AS NEEDED., Disp: 60 tablet, Rfl: 2 .  traMADol (ULTRAM) 50 MG tablet, Take 1 tablet (50 mg total) by mouth every 6 (six) hours as  needed for severe pain., Disp: 120 tablet, Rfl: 2 .  valACYclovir (VALTREX) 1000 MG tablet, Take 1,000 mg by mouth as needed. , Disp: , Rfl: 6  Social History   Tobacco Use  Smoking Status Former Smoker  . Packs/day: 1.00  . Years: 25.00  . Pack years: 25.00  . Types: Cigarettes  . Last attempt to quit: 10/09/2005  . Years since quitting: 12.3  Smokeless Tobacco Never Used    Allergies  Allergen Reactions  . Codeine Shortness Of Breath    Pt reports this was 30 years ago.   . Nsaids   . Lisinopril Cough  . Sulfamethoxazole    Objective:   Vitals:   01/23/18 1542  BP: (!) 153/76  Pulse: 79   There is no height or weight on file to calculate BMI. Constitutional Well developed. Well nourished.  Vascular Dorsalis pedis pulses palpable bilaterally. Posterior tibial pulses palpable bilaterally. Capillary refill normal to all digits.  No cyanosis or clubbing noted. Pedal hair growth normal.  Neurologic Normal speech. Oriented to person, place, and time. Epicritic sensation to light touch grossly present bilaterally.  Dermatologic Nails well groomed and normal in appearance. No open wounds. No skin lesions.  Orthopedic: POP L 3rd interspace with Mulder's click. Pain with patient about the right fourth second metatarsal base, fourth metatarsal neck   Radiographs: Evidence of subacute fourth metatarsal stress fracture at the neck, age-indeterminate second metatarsal base fracture Assessment:   1. Displaced fracture of second metatarsal bone, right foot, initial encounter for closed fracture   2. Morton neuroma, left   3. Closed fracture of right foot with delayed healing, subsequent encounter    Plan:  Patient was evaluated and treated and all questions answered.  Fracture of second metatarsal base -May benefit from surgical intervention -We will order CT for further eval -Difficult to determine whether this is an acute fracture or a fracture with  nonunion -Immobilized walker boot  Fracture of fourth metatarsal neck -Likely stress fracture appears subacute we will consider conservative therapy for this fracture alone but CT will better characterize  Interdigital Neuroma, left -Educated on etiology -Interspace injection delivered as below. -Educated on padding and proper shoegear  Procedure: Neuroma Injection Location: Left 3rd interspace Skin Prep: Alcohol. Injectate: 0.5 cc 0.5% marcaine plain, 0.5 cc dexamethasone phosphate. Disposition: Patient tolerated procedure well. Injection site dressed with a band-aid.  Return in about 2 weeks (around 02/06/2018) for CT f/u .

## 2018-01-27 ENCOUNTER — Other Ambulatory Visit: Payer: Self-pay | Admitting: Podiatry

## 2018-01-27 DIAGNOSIS — S92321A Displaced fracture of second metatarsal bone, right foot, initial encounter for closed fracture: Secondary | ICD-10-CM

## 2018-01-27 DIAGNOSIS — G5762 Lesion of plantar nerve, left lower limb: Secondary | ICD-10-CM

## 2018-01-29 ENCOUNTER — Encounter: Payer: Self-pay | Admitting: Allergy and Immunology

## 2018-01-29 ENCOUNTER — Ambulatory Visit (INDEPENDENT_AMBULATORY_CARE_PROVIDER_SITE_OTHER): Payer: PRIVATE HEALTH INSURANCE | Admitting: Allergy and Immunology

## 2018-01-29 VITALS — BP 110/70 | HR 88 | Temp 98.2°F | Resp 16

## 2018-01-29 DIAGNOSIS — J3089 Other allergic rhinitis: Secondary | ICD-10-CM

## 2018-01-29 DIAGNOSIS — K219 Gastro-esophageal reflux disease without esophagitis: Secondary | ICD-10-CM

## 2018-01-29 DIAGNOSIS — J4540 Moderate persistent asthma, uncomplicated: Secondary | ICD-10-CM | POA: Diagnosis not present

## 2018-01-29 MED ORDER — MONTELUKAST SODIUM 10 MG PO TABS: 10.0000 mg | ORAL_TABLET | Freq: Every day | ORAL | 5 refills | Status: DC

## 2018-01-29 MED ORDER — TIOTROPIUM BROMIDE MONOHYDRATE 1.25 MCG/ACT IN AERS: 2.0000 | INHALATION_SPRAY | Freq: Every day | RESPIRATORY_TRACT | 5 refills | Status: DC

## 2018-01-29 NOTE — Assessment & Plan Note (Signed)
Stable.  Continue appropriate allergen avoidance measures, azelastine nasal spray as needed, nasal saline irrigation as needed, and carbinoxamine as needed.  Proper nasal spray technique has been discussed and demonstrated.

## 2018-01-29 NOTE — Progress Notes (Signed)
Follow-up Note  RE: Heidi Walls MRN: [id number] DOB: 1955/12/29 Date of Office Visit: 01/29/2018  Primary care provider: Terald Sleeper, PA-C Referring provider: Terald Sleeper, PA-C  History of present illness: Heidi Walls is a 63 y.o. female with persistent asthma, allergic rhinitis, and gastroesophageal reflux presenting today for follow-up.  She was last seen in this clinic in September 2019.  She reports that her asthma has been well controlled with Symbicort 160-4.5 micrograms, 2 elations via spacer device twice daily.  However, she notes that she requires fluconazole every week in order to suppress oral candidiasis.  Even with the fluconazole she has had thrush 3 times over the past 3 months.  She reports that she has been experiencing some nasal congestion.  The nasal congestion is relieved when she uses azelastine nasal spray, however she does not like the bitter taste of the nasal spray.  She has no reflux related complaints today.  Assessment and plan: Moderate persistent asthma Due to recurrence of oral candidiasis, we will attempt to switch from Symbicort to nonsteroid controller medications.  A prescription has been provided for Spiriva Respimat 1.25 g, 2 inhalations daily.  A prescription has been provided for montelukast 10 mg daily at bedtime.  Discontinue Symbicort.  Continue albuterol every 4-6 hours as needed.  Subjective and objective measures of pulmonary function will be followed and the treatment plan will be adjusted accordingly.  Perennial allergic rhinitis with a probable nonallergic component Stable.  Continue appropriate allergen avoidance measures, azelastine nasal spray as needed, nasal saline irrigation as needed, and carbinoxamine as needed.  Proper nasal spray technique has been discussed and demonstrated.  GERD (gastroesophageal reflux disease)  Continue appropriate reflux lifestyle modifications and Nexium as prescribed.   Meds  ordered this encounter  Medications  . montelukast (SINGULAIR) 10 MG tablet    Sig: Take 1 tablet (10 mg total) by mouth at bedtime.    Dispense:  30 tablet    Refill:  5  . Tiotropium Bromide Monohydrate (SPIRIVA RESPIMAT) 1.25 MCG/ACT AERS    Sig: Inhale 2 puffs into the lungs daily.    Dispense:  4 g    Refill:  5    Diagnostics: Spirometry reveals an FVC of 2.20 L and an FEV1 of 1.99 L (83% predicted).  FEV1 is consistent with previous study.  Please see scanned spirometry results for details.    Physical examination: Blood pressure 110/70, pulse 88, temperature 98.2 F (36.8 C), temperature source Oral, resp. rate 16, SpO2 96 %.  General: Alert, interactive, in no acute distress. HEENT: TMs pearly gray, turbinates mildly edematous without discharge, post-pharynx mildly erythematous. Neck: Supple without lymphadenopathy. Lungs: Clear to auscultation without wheezing, rhonchi or rales. CV: Normal S1, S2 without murmurs. Skin: Warm and dry, without lesions or rashes.  The following portions of the patient's history were reviewed and updated as appropriate: allergies, current medications, past family history, past medical history, past social history, past surgical history and problem list.  Allergies as of 01/29/2018      Reactions   Codeine Shortness Of Breath   Pt reports this was 30 years ago.   Nsaids    Lisinopril Cough   Sulfamethoxazole       Medication List       Accurate as of January 29, 2018  1:18 PM. Always use your most recent med list.        acetaminophen 325 MG tablet Commonly known as:  TYLENOL Take 650 mg  by mouth daily.   Acidophilus Tabs Take by mouth.   albuterol 108 (90 Base) MCG/ACT inhaler Commonly known as:  PROAIR HFA Inhale 2 puffs into the lungs every 4 (four) hours as needed for wheezing or shortness of breath.   alendronate 70 MG tablet Commonly known as:  FOSAMAX TAKE 1 TABLET BY MOUTH ONCE WEEKLY WITH A FULL GLASS OF WATER ON  AN EMPTY STOMACH   ALPRAZolam 0.5 MG tablet Commonly known as:  XANAX Take 1 tablet (0.5 mg total) by mouth 3 (three) times daily as needed.   Azelastine HCl 0.15 % Soln One spray each nostril 1-2 times a day as needed   budesonide-formoterol 160-4.5 MCG/ACT inhaler Commonly known as:  SYMBICORT TWO PUFFS WITH SPACER TWICE A DAY TO PREVENT COUGH OR WHEEZE.   CALCIUM 600 + D PO Take 1 tablet by mouth daily.   Carbinoxamine Maleate 4 MG Tabs TAKE 1 TABLET EVERY 8 HOURS AS NEEDED FOR RUNNY NOSE OR ITCHING.   DULoxetine 60 MG capsule Commonly known as:  CYMBALTA Take 1 capsule (60 mg total) by mouth daily.   esomeprazole 20 MG capsule Commonly known as:  NEXIUM Take 20 mg by mouth daily.   Fish Oil 1200 MG Caps Take 1,200 mg by mouth daily.   fluconazole 150 MG tablet Commonly known as:  DIFLUCAN 1 po q week x 4 weeks   FOLIC ACID PO Take 1 tablet by mouth daily.   gabapentin 300 MG capsule Commonly known as:  NEURONTIN TAKE 1-3 CAPSULES BY MOUTH 3 TIMES DAILY.   HYDROcodone-acetaminophen 10-325 MG tablet Commonly known as:  NORCO Take 1 tablet by mouth every 6 (six) hours as needed.   letrozole 2.5 MG tablet Commonly known as:  FEMARA Take 1 tablet (2.5 mg total) by mouth daily.   montelukast 10 MG tablet Commonly known as:  SINGULAIR Take 1 tablet (10 mg total) by mouth at bedtime.   multivitamin tablet Take 1 tablet by mouth daily.   olmesartan-hydrochlorothiazide 20-12.5 MG tablet Commonly known as:  BENICAR HCT Take 1 tablet by mouth daily.   olopatadine 0.1 % ophthalmic solution Commonly known as:  PATANOL Place 1 drop into both eyes 2 (two) times daily.   SUPER B COMPLEX PO Take 1 tablet by mouth daily.   Tiotropium Bromide Monohydrate 1.25 MCG/ACT Aers Commonly known as:  SPIRIVA RESPIMAT Inhale 2 puffs into the lungs daily.   traMADol 50 MG tablet Commonly known as:  ULTRAM Take 1 tablet (50 mg total) by mouth every 6 (six) hours as needed  for severe pain.   valACYclovir 1000 MG tablet Commonly known as:  VALTREX Take 1,000 mg by mouth as needed.       Allergies  Allergen Reactions  . Codeine Shortness Of Breath    Pt reports this was 30 years ago.   . Nsaids   . Lisinopril Cough  . Sulfamethoxazole    Review of systems: Review of systems negative except as noted in HPI / PMHx or noted below: Constitutional: Negative.  HENT: Negative.   Eyes: Negative.  Respiratory: Negative.   Cardiovascular: Negative.  Gastrointestinal: Negative.  Genitourinary: Negative.  Musculoskeletal: Negative.  Neurological: Negative.  Endo/Heme/Allergies: Negative.  Cutaneous: Negative.  Past Medical History:  Diagnosis Date  . Anemia   . Anxiety    OCD  . Arthritis    knees and hips  . Asthma   . Breast cancer ([place])   . Breast cancer of upper-inner quadrant of left female breast (  [place]) 05/26/2015  . GERD (gastroesophageal reflux disease)   . Hypertension   . Insomnia     Family History  Problem Relation Age of Onset  . Heart failure Mother   . Diabetes Mother   . Hypertension Mother   . Hypertension Father   . Breast cancer Maternal Grandmother   . Allergic rhinitis Sister   . Sinusitis Sister   . Allergic rhinitis Daughter   . Allergic rhinitis Son   . Sinusitis Son   . Bronchitis Grandchild   . Asthma Neg Hx   . Eczema Neg Hx   . Urticaria Neg Hx   . Immunodeficiency Neg Hx   . Angioedema Neg Hx     Social History   Socioeconomic History  . Marital status: Married    Spouse name: Not on file  . Number of children: Not on file  . Years of education: Not on file  . Highest education level: Not on file  Occupational History  . Not on file  Social Needs  . Financial resource strain: Not on file  . Food insecurity:    Worry: Not on file    Inability: Not on file  . Transportation needs:    Medical: Not on file    Non-medical: Not on file  Tobacco Use  . Smoking status: Former Smoker     Packs/day: 1.00    Years: 25.00    Pack years: 25.00    Types: Cigarettes    Last attempt to quit: 10/09/2005    Years since quitting: 12.3  . Smokeless tobacco: Never Used  Substance and Sexual Activity  . Alcohol use: No    Frequency: Never  . Drug use: No  . Sexual activity: Not on file  Lifestyle  . Physical activity:    Days per week: Not on file    Minutes per session: Not on file  . Stress: Not on file  Relationships  . Social connections:    Talks on phone: Not on file    Gets together: Not on file    Attends religious service: Not on file    Active member of club or organization: Not on file    Attends meetings of clubs or organizations: Not on file    Relationship status: Not on file  . Intimate partner violence:    Fear of current or ex partner: Not on file    Emotionally abused: Not on file    Physically abused: Not on file    Forced sexual activity: Not on file  Other Topics Concern  . Not on file  Social History Narrative  . Not on file    I appreciate the opportunity to take part in Heidi Walls's care. Please do not hesitate to contact me with questions.  Sincerely,   R. Edgar Frisk, MD

## 2018-01-29 NOTE — Assessment & Plan Note (Signed)
   Continue appropriate reflux lifestyle modifications and Nexium as prescribed.

## 2018-01-29 NOTE — Patient Instructions (Addendum)
Moderate persistent asthma Due to recurrence of oral candidiasis, we will attempt to switch from Symbicort to nonsteroid controller medications.  A prescription has been provided for Spiriva Respimat 1.25 g, 2 inhalations daily.  A prescription has been provided for montelukast 10 mg daily at bedtime.  Discontinue Symbicort.  Continue albuterol every 4-6 hours as needed.  Subjective and objective measures of pulmonary function will be followed and the treatment plan will be adjusted accordingly.  Perennial allergic rhinitis with a probable nonallergic component Stable.  Continue appropriate allergen avoidance measures, azelastine nasal spray as needed, nasal saline irrigation as needed, and carbinoxamine as needed.  Proper nasal spray technique has been discussed and demonstrated.  GERD (gastroesophageal reflux disease)  Continue appropriate reflux lifestyle modifications and Nexium as prescribed.   Return in about 4 months (around 05/30/2018), or if symptoms worsen or fail to improve.

## 2018-01-29 NOTE — Assessment & Plan Note (Signed)
Due to recurrence of oral candidiasis, we will attempt to switch from Symbicort to nonsteroid controller medications.  A prescription has been provided for Spiriva Respimat 1.25 g, 2 inhalations daily.  A prescription has been provided for montelukast 10 mg daily at bedtime.  Discontinue Symbicort.  Continue albuterol every 4-6 hours as needed.  Subjective and objective measures of pulmonary function will be followed and the treatment plan will be adjusted accordingly.

## 2018-01-30 ENCOUNTER — Ambulatory Visit (INDEPENDENT_AMBULATORY_CARE_PROVIDER_SITE_OTHER): Payer: PRIVATE HEALTH INSURANCE | Admitting: Podiatry

## 2018-01-30 ENCOUNTER — Other Ambulatory Visit: Payer: Self-pay | Admitting: Podiatry

## 2018-01-30 ENCOUNTER — Ambulatory Visit (INDEPENDENT_AMBULATORY_CARE_PROVIDER_SITE_OTHER): Payer: PRIVATE HEALTH INSURANCE

## 2018-01-30 DIAGNOSIS — S92321A Displaced fracture of second metatarsal bone, right foot, initial encounter for closed fracture: Secondary | ICD-10-CM

## 2018-01-30 DIAGNOSIS — M21962 Unspecified acquired deformity of left lower leg: Secondary | ICD-10-CM

## 2018-01-30 DIAGNOSIS — G5762 Lesion of plantar nerve, left lower limb: Secondary | ICD-10-CM | POA: Diagnosis not present

## 2018-01-30 DIAGNOSIS — S9032XA Contusion of left foot, initial encounter: Secondary | ICD-10-CM | POA: Diagnosis not present

## 2018-01-30 DIAGNOSIS — S92901G Unspecified fracture of right foot, subsequent encounter for fracture with delayed healing: Secondary | ICD-10-CM

## 2018-01-31 ENCOUNTER — Telehealth: Payer: Self-pay | Admitting: *Deleted

## 2018-01-31 NOTE — Telephone Encounter (Signed)
-----   Message from Evelina Bucy, DPM sent at 01/31/2018  7:37 AM EST ----- We ordered a CT scan and it was approved but they want her to go all the way to [facility] for it... is there anything we can do about it?

## 2018-01-31 NOTE — Telephone Encounter (Signed)
I asked pt if her insurance representative had given any alternative CT sites in [place] they may use, or steps to take to enable pt to have CT in [place].

## 2018-01-31 NOTE — Telephone Encounter (Signed)
Left message informing pt of my conversation with Philipp Deputy and reference#, and [facility] [phone].

## 2018-01-31 NOTE — Telephone Encounter (Signed)
I called Heidi Walls states pt's plan does not have a preferred site for labs or imaging, Reference# [id number].

## 2018-02-01 NOTE — Progress Notes (Signed)
Subjective:  Patient ID: Heidi Walls, female    DOB: 21-Mar-1955,  MRN: [id number]  Chief Complaint  Patient presents with  . Foot Injury    F/U R Ct scan and R foot fx Pt. states," no pain and it's way easier to walk with the boot, otherwise it's doing about the same." Tx: cam boot    63 y.o. female presents with the above complaint.  States that the CT scan was approved by her insurance but she has to go over the [place] to get it done.  New complaint of dropping a can on the outside of her left foot has pain and bruising.  Review of Systems: Negative except as noted in the HPI. Denies N/V/F/Ch.  Past Medical History:  Diagnosis Date  . Anemia   . Anxiety    OCD  . Arthritis    knees and hips  . Asthma   . Breast cancer ([place])   . Breast cancer of upper-inner quadrant of left female breast ([place]) 05/26/2015  . GERD (gastroesophageal reflux disease)   . Hypertension   . Insomnia     Current Outpatient Medications:  .  acetaminophen (TYLENOL) 325 MG tablet, Take 650 mg by mouth daily., Disp: , Rfl:  .  albuterol (PROAIR HFA) 108 (90 Base) MCG/ACT inhaler, Inhale 2 puffs into the lungs every 4 (four) hours as needed for wheezing or shortness of breath., Disp: 1 Inhaler, Rfl: 2 .  alendronate (FOSAMAX) 70 MG tablet, TAKE 1 TABLET BY MOUTH ONCE WEEKLY WITH A FULL GLASS OF WATER ON AN EMPTY STOMACH, Disp: 12 tablet, Rfl: 0 .  ALPRAZolam (XANAX) 0.5 MG tablet, Take 1 tablet (0.5 mg total) by mouth 3 (three) times daily as needed., Disp: 90 tablet, Rfl: 5 .  Azelastine HCl 0.15 % SOLN, One spray each nostril 1-2 times a day as needed, Disp: 30 mL, Rfl: 5 .  B Complex-C (SUPER B COMPLEX PO), Take 1 tablet by mouth daily., Disp: , Rfl:  .  budesonide-formoterol (SYMBICORT) 160-4.5 MCG/ACT inhaler, TWO PUFFS WITH SPACER TWICE A DAY TO PREVENT COUGH OR WHEEZE., Disp: 30.6 Inhaler, Rfl: 0 .  Calcium Carb-Cholecalciferol (CALCIUM 600 + D PO), Take 1 tablet by mouth daily., Disp: ,  Rfl:  .  Carbinoxamine Maleate 4 MG TABS, TAKE 1 TABLET EVERY 8 HOURS AS NEEDED FOR RUNNY NOSE OR ITCHING., Disp: 270 tablet, Rfl: 1 .  DULoxetine (CYMBALTA) 60 MG capsule, Take 1 capsule (60 mg total) by mouth daily., Disp: 90 capsule, Rfl: 3 .  esomeprazole (NEXIUM) 20 MG capsule, Take 20 mg by mouth daily. , Disp: , Rfl:  .  fluconazole (DIFLUCAN) 150 MG tablet, 1 po q week x 4 weeks, Disp: 4 tablet, Rfl: 11 .  FOLIC ACID PO, Take 1 tablet by mouth daily., Disp: , Rfl:  .  gabapentin (NEURONTIN) 300 MG capsule, TAKE 1-3 CAPSULES BY MOUTH 3 TIMES DAILY., Disp: 810 capsule, Rfl: 0 .  HYDROcodone-acetaminophen (NORCO) 10-325 MG tablet, Take 1 tablet by mouth every 6 (six) hours as needed., Disp: 90 tablet, Rfl: 0 .  Lactobacillus (ACIDOPHILUS) TABS, Take by mouth., Disp: , Rfl:  .  letrozole (FEMARA) 2.5 MG tablet, Take 1 tablet (2.5 mg total) by mouth daily., Disp: 90 tablet, Rfl: 3 .  montelukast (SINGULAIR) 10 MG tablet, Take 1 tablet (10 mg total) by mouth at bedtime., Disp: 30 tablet, Rfl: 5 .  Multiple Vitamin (MULTIVITAMIN) tablet, Take 1 tablet by mouth daily., Disp: , Rfl:  .  olmesartan-hydrochlorothiazide (BENICAR HCT) 20-12.5 MG tablet, Take 1 tablet by mouth daily., Disp: 90 tablet, Rfl: 3 .  olopatadine (PATANOL) 0.1 % ophthalmic solution, Place 1 drop into both eyes 2 (two) times daily., Disp: 5 mL, Rfl: 12 .  Omega-3 Fatty Acids (FISH OIL) 1200 MG CAPS, Take 1,200 mg by mouth daily., Disp: , Rfl:  .  Tiotropium Bromide Monohydrate (SPIRIVA RESPIMAT) 1.25 MCG/ACT AERS, Inhale 2 puffs into the lungs daily., Disp: 4 g, Rfl: 5 .  traMADol (ULTRAM) 50 MG tablet, Take 1 tablet (50 mg total) by mouth every 6 (six) hours as needed for severe pain., Disp: 120 tablet, Rfl: 2 .  valACYclovir (VALTREX) 1000 MG tablet, Take 1,000 mg by mouth as needed. , Disp: , Rfl: 6  Social History   Tobacco Use  Smoking Status Former Smoker  . Packs/day: 1.00  . Years: 25.00  . Pack years: 25.00  .  Types: Cigarettes  . Last attempt to quit: 10/09/2005  . Years since quitting: 12.3  Smokeless Tobacco Never Used    Allergies  Allergen Reactions  . Codeine Shortness Of Breath    Pt reports this was 30 years ago.   . Nsaids   . Lisinopril Cough  . Sulfamethoxazole    Objective:   There were no vitals filed for this visit. There is no height or weight on file to calculate BMI. Constitutional Well developed. Well nourished.  Vascular Dorsalis pedis pulses palpable bilaterally. Posterior tibial pulses palpable bilaterally. Capillary refill normal to all digits.  No cyanosis or clubbing noted. Pedal hair growth normal.  Neurologic Normal speech. Oriented to person, place, and time. Epicritic sensation to light touch grossly present bilaterally.  Dermatologic Nails well groomed and normal in appearance. No open wounds. No skin lesions.  Orthopedic: POP L 3rd interspace with Mulder's click. Pain with patient about the right fourth second metatarsal base, fourth metatarsal neck Pain contusion left lateral dorsal foot   Radiographs: X-rays left foot taken today no acute fractures or dislocations Assessment:   1. Contusion of left foot, initial encounter   2. Displaced fracture of second metatarsal bone, right foot, initial encounter for closed fracture   3. Morton neuroma, left   4. Closed fracture of right foot with delayed healing, subsequent encounter    Plan:  Patient was evaluated and treated and all questions answered.  Right fracture of second metatarsal base, right fourth metatarsal neck -Pending CT, second metatarsal base fracture may benefit from surgical intervention. -Continue cam boot immobilization  Left foot injury, contusion -X-rays taken reviewed no acute fractures  Return in about 2 weeks (around 02/13/2018).

## 2018-02-05 ENCOUNTER — Ambulatory Visit
Admission: RE | Admit: 2018-02-05 | Discharge: 2018-02-05 | Disposition: A | Discharge status: Home / Self Care | Payer: BLUE CROSS/BLUE SHIELD | Source: Ambulatory Visit | Attending: Podiatry | Admitting: Podiatry

## 2018-02-05 ENCOUNTER — Other Ambulatory Visit: Payer: Self-pay | Admitting: Hematology and Oncology

## 2018-02-05 DIAGNOSIS — S92901G Unspecified fracture of right foot, subsequent encounter for fracture with delayed healing: Secondary | ICD-10-CM

## 2018-02-12 ENCOUNTER — Other Ambulatory Visit: Payer: Self-pay

## 2018-02-12 ENCOUNTER — Encounter: Payer: Self-pay | Admitting: Hematology and Oncology

## 2018-02-12 MED ORDER — LETROZOLE 2.5 MG PO TABS: 2.5000 mg | ORAL_TABLET | Freq: Every day | ORAL | 3 refills | Status: DC

## 2018-02-12 NOTE — Progress Notes (Signed)
Received voicemail from navigator regarding assistance with Letrozole.  Delivered information and application for RX Outreach to [facility] for patient to complete an return to them. Prescription form was included for physician to be completed.

## 2018-02-13 ENCOUNTER — Ambulatory Visit (INDEPENDENT_AMBULATORY_CARE_PROVIDER_SITE_OTHER): Payer: PRIVATE HEALTH INSURANCE | Admitting: Podiatry

## 2018-02-13 DIAGNOSIS — S92321D Displaced fracture of second metatarsal bone, right foot, subsequent encounter for fracture with routine healing: Secondary | ICD-10-CM | POA: Diagnosis not present

## 2018-02-14 ENCOUNTER — Telehealth: Payer: Self-pay | Admitting: Physician Assistant

## 2018-02-14 ENCOUNTER — Other Ambulatory Visit: Payer: Self-pay | Admitting: Family Medicine

## 2018-02-14 DIAGNOSIS — B354 Tinea corporis: Secondary | ICD-10-CM

## 2018-02-14 MED ORDER — KETOCONAZOLE 2 % EX CREA: 1.0000 "application " | TOPICAL_CREAM | Freq: Every day | CUTANEOUS | 0 refills | Status: AC

## 2018-02-14 NOTE — Telephone Encounter (Signed)
Pt aware.

## 2018-02-14 NOTE — Telephone Encounter (Signed)
[facility] to [facility] in [facility]. Use once daily for 10 days. If no improvement in symptoms, need to be seen

## 2018-02-14 NOTE — Telephone Encounter (Signed)
Jones pt

## 2018-02-17 ENCOUNTER — Telehealth: Payer: Self-pay | Admitting: *Deleted

## 2018-02-17 NOTE — Telephone Encounter (Signed)
Yes, she may switch back to Symbicort 160-4.5 g, 2 inhalations via spacer device twice daily.  Please encourage her to always use a spacer device and to rinse her mouth well after using the Symbicort.  Thank you.

## 2018-02-17 NOTE — Telephone Encounter (Signed)
Pt called stating her cough, sob, wheezing has increased since starting spirivia respimat. She would like to be put back on symbicort. Please advise.

## 2018-02-18 ENCOUNTER — Encounter: Payer: Self-pay | Admitting: Physician Assistant

## 2018-02-18 ENCOUNTER — Ambulatory Visit (INDEPENDENT_AMBULATORY_CARE_PROVIDER_SITE_OTHER): Payer: PRIVATE HEALTH INSURANCE | Admitting: Physician Assistant

## 2018-02-18 DIAGNOSIS — G629 Polyneuropathy, unspecified: Secondary | ICD-10-CM | POA: Diagnosis not present

## 2018-02-18 DIAGNOSIS — M1712 Unilateral primary osteoarthritis, left knee: Secondary | ICD-10-CM | POA: Diagnosis not present

## 2018-02-18 DIAGNOSIS — G893 Neoplasm related pain (acute) (chronic): Secondary | ICD-10-CM | POA: Diagnosis not present

## 2018-02-18 MED ORDER — GABAPENTIN 600 MG PO TABS: 600.0000 mg | ORAL_TABLET | Freq: Three times a day (TID) | ORAL | 5 refills | Status: DC

## 2018-02-18 MED ORDER — HYDROCODONE-ACETAMINOPHEN 10-325 MG PO TABS: 1.0000 | ORAL_TABLET | Freq: Three times a day (TID) | ORAL | 0 refills | Status: DC | PRN

## 2018-02-18 MED ORDER — BUDESONIDE-FORMOTEROL FUMARATE 160-4.5 MCG/ACT IN AERO: INHALATION_SPRAY | RESPIRATORY_TRACT | 3 refills | Status: DC

## 2018-02-18 NOTE — Progress Notes (Signed)
BP 129/78   Pulse 92   Temp 98.7 F (37.1 C) (Oral)   Ht '5\' 3"'$  (1.6 m)   Wt 160 lb 6.4 oz (72.8 kg)   BMI 28.41 kg/m    Subjective:    Patient ID: Heidi Walls, female    DOB: May 27, 1955, 63 y.o.   MRN: [id number]  HPI: Heidi Walls is a 63 y.o. female presenting on 02/18/2018 for Recurrent Skin Infections and Medication Refill  She will get yeast a lot, and even thrush from her steroid inhaler. Diflucan once weekly has been helpful, can be continued. She does have refills.  This patient returns for a 6 month recheck on narcotic use for chronic pain from neoplasm and chemotherapy, neuropathy, OA multiple joints and medication refills  Patient currently taking hydrocodone, gabapentin we are trying to titrate the gabapentin to as high dose as tolerable.  She is currently taking 900 mg a day.  We will have her do 1200 mg over the next couple of weeks and then try to go to 1800 mg in the next month.  She is not able to take NSAIDs because of some kidney dysfunction. Behavior- normal Medication side effects- none Any concerns- no  PMP AWARE website reviewed: Yes Any suspicious activity on PMP Aware: No   Past Medical History:  Diagnosis Date  . Anemia   . Anxiety    OCD  . Arthritis    knees and hips  . Asthma   . Breast cancer ([place])   . Breast cancer of upper-inner quadrant of left female breast ([place]) 05/26/2015  . GERD (gastroesophageal reflux disease)   . Hypertension   . Insomnia    Relevant past medical, surgical, family and social history reviewed and updated as indicated. Interim medical history since our last visit reviewed. Allergies and medications reviewed and updated. DATA REVIEWED: CHART IN EPIC  Family History reviewed for pertinent findings.  Review of Systems  Constitutional: Negative.  Negative for activity change, fatigue and fever.  HENT: Negative.   Eyes: Negative.   Respiratory: Negative.  Negative for cough.   Cardiovascular: Negative.   Negative for chest pain.  Gastrointestinal: Negative.  Negative for abdominal pain.  Endocrine: Negative.   Genitourinary: Negative.  Negative for dysuria.  Musculoskeletal: Positive for arthralgias, back pain, gait problem, joint swelling, myalgias and neck pain.  Skin: Negative.   Psychiatric/Behavioral: Positive for dysphoric mood and sleep disturbance. Negative for suicidal ideas. The patient is nervous/anxious.     Allergies as of 02/18/2018      Reactions   Codeine Shortness Of Breath   Pt reports this was 30 years ago.   Nsaids    Lisinopril Cough   Sulfamethoxazole       Medication List       Accurate as of February 18, 2018  4:40 PM. Always use your most recent med list.        acetaminophen 325 MG tablet Commonly known as:  TYLENOL Take 650 mg by mouth daily.   albuterol 108 (90 Base) MCG/ACT inhaler Commonly known as:  PROAIR HFA Inhale 2 puffs into the lungs every 4 (four) hours as needed for wheezing or shortness of breath.   alendronate 70 MG tablet Commonly known as:  FOSAMAX TAKE 1 TABLET BY MOUTH ONCE WEEKLY WITH A FULL GLASS OF WATER ON AN EMPTY STOMACH   ALPRAZolam 0.5 MG tablet Commonly known as:  XANAX Take 1 tablet (0.5 mg total) by mouth 3 (three) times daily as  needed.   Azelastine HCl 0.15 % Soln One spray each nostril 1-2 times a day as needed   budesonide-formoterol 160-4.5 MCG/ACT inhaler Commonly known as:  SYMBICORT 2 puffs twice daily with spacer device to prevent coughing or wheezing.   CALCIUM 600 + D PO Take 1 tablet by mouth daily.   Carbinoxamine Maleate 4 MG Tabs TAKE 1 TABLET EVERY 8 HOURS AS NEEDED FOR RUNNY NOSE OR ITCHING.   DULoxetine 60 MG capsule Commonly known as:  CYMBALTA Take 1 capsule (60 mg total) by mouth daily.   esomeprazole 20 MG capsule Commonly known as:  NEXIUM Take 20 mg by mouth daily.   Fish Oil 1200 MG Caps Take 1,200 mg by mouth daily.   fluconazole 150 MG tablet Commonly known as:   DIFLUCAN 1 po q week x 4 weeks   FOLIC ACID PO Take 1 tablet by mouth daily.   gabapentin 600 MG tablet Commonly known as:  NEURONTIN Take 1 tablet (600 mg total) by mouth 3 (three) times daily.   HYDROcodone-acetaminophen 10-325 MG tablet Commonly known as:  NORCO Take 1 tablet by mouth every 8 (eight) hours as needed.   HYDROcodone-acetaminophen 10-325 MG tablet Commonly known as:  NORCO Take 1 tablet by mouth every 8 (eight) hours as needed.   HYDROcodone-acetaminophen 10-325 MG tablet Commonly known as:  NORCO Take 1 tablet by mouth every 8 (eight) hours as needed.   ketoconazole 2 % cream Commonly known as:  NIZORAL Apply 1 application topically daily for 10 days.   letrozole 2.5 MG tablet Commonly known as:  FEMARA Take 1 tablet (2.5 mg total) by mouth daily.   montelukast 10 MG tablet Commonly known as:  SINGULAIR Take 1 tablet (10 mg total) by mouth at bedtime.   multivitamin tablet Take 1 tablet by mouth daily.   olmesartan-hydrochlorothiazide 20-12.5 MG tablet Commonly known as:  BENICAR HCT Take 1 tablet by mouth daily.   olopatadine 0.1 % ophthalmic solution Commonly known as:  PATANOL Place 1 drop into both eyes 2 (two) times daily.   SUPER B COMPLEX PO Take 1 tablet by mouth daily.   Tiotropium Bromide Monohydrate 1.25 MCG/ACT Aers Commonly known as:  SPIRIVA RESPIMAT Inhale 2 puffs into the lungs daily.   traMADol 50 MG tablet Commonly known as:  ULTRAM Take 1 tablet (50 mg total) by mouth every 6 (six) hours as needed for severe pain.   valACYclovir 1000 MG tablet Commonly known as:  VALTREX Take 1,000 mg by mouth as needed.          Objective:    BP 129/78   Pulse 92   Temp 98.7 F (37.1 C) (Oral)   Ht '5\' 3"'$  (1.6 m)   Wt 160 lb 6.4 oz (72.8 kg)   BMI 28.41 kg/m   Allergies  Allergen Reactions  . Codeine Shortness Of Breath    Pt reports this was 30 years ago.   . Nsaids   . Lisinopril Cough  . Sulfamethoxazole     Wt  Readings from Last 3 Encounters:  02/18/18 160 lb 6.4 oz (72.8 kg)  01/13/18 159 lb 6.4 oz (72.3 kg)  10/03/17 164 lb (74.4 kg)    Physical Exam Constitutional:      Appearance: She is well-developed.  HENT:     Head: Normocephalic and atraumatic.  Eyes:     Conjunctiva/sclera: Conjunctivae normal.     Pupils: Pupils are equal, round, and reactive to light.  Cardiovascular:  Rate and Rhythm: Normal rate and regular rhythm.     Heart sounds: Normal heart sounds.  Pulmonary:     Effort: Pulmonary effort is normal.     Breath sounds: Normal breath sounds.  Abdominal:     General: Bowel sounds are normal.     Palpations: Abdomen is soft.  Skin:    General: Skin is warm and dry.     Findings: No rash.  Neurological:     Mental Status: She is alert and oriented to person, place, and time.     Deep Tendon Reflexes: Reflexes are normal and symmetric.  Psychiatric:        Behavior: Behavior normal.        Thought Content: Thought content normal.        Judgment: Judgment normal.     Results for orders placed or performed in visit on 01/13/18  CBC with Differential/Platelet  Result Value Ref Range   WBC 6.5 3.4 - 10.8 x10E3/uL   RBC 3.87 3.77 - 5.28 x10E6/uL   Hemoglobin 10.6 (L) 11.1 - 15.9 g/dL   Hematocrit 32.7 (L) 34.0 - 46.6 %   MCV 85 79 - 97 fL   MCH 27.4 26.6 - 33.0 pg   MCHC 32.4 31.5 - 35.7 g/dL   RDW 13.0 11.7 - 15.4 %   Platelets 375 150 - 450 x10E3/uL   Neutrophils 68 Not Estab. %   Lymphs 21 Not Estab. %   Monocytes 7 Not Estab. %   Eos 3 Not Estab. %   Basos 1 Not Estab. %   Neutrophils Absolute 4.4 1.4 - 7.0 x10E3/uL   Lymphocytes Absolute 1.4 0.7 - 3.1 x10E3/uL   Monocytes Absolute 0.5 0.1 - 0.9 x10E3/uL   EOS (ABSOLUTE) 0.2 0.0 - 0.4 x10E3/uL   Basophils Absolute 0.1 0.0 - 0.2 x10E3/uL   Immature Granulocytes 0 Not Estab. %   Immature Grans (Abs) 0.0 0.0 - 0.1 x10E3/uL  CMP14+EGFR  Result Value Ref Range   Glucose 110 (H) 65 - 99 mg/dL   BUN 20  8 - 27 mg/dL   Creatinine, Ser 1.21 (H) 0.57 - 1.00 mg/dL   GFR calc non Af Amer 48 (L) >59 mL/min/1.73   GFR calc Af Amer 55 (L) >59 mL/min/1.73   BUN/Creatinine Ratio 17 12 - 28   Sodium 136 134 - 144 mmol/L   Potassium 4.4 3.5 - 5.2 mmol/L   Chloride 98 96 - 106 mmol/L   CO2 25 20 - 29 mmol/L   Calcium 10.4 (H) 8.7 - 10.3 mg/dL   Total Protein 7.0 6.0 - 8.5 g/dL   Albumin 4.4 3.6 - 4.8 g/dL   Globulin, Total 2.6 1.5 - 4.5 g/dL   Albumin/Globulin Ratio 1.7 1.2 - 2.2   Bilirubin Total <0.2 0.0 - 1.2 mg/dL   Alkaline Phosphatase 93 39 - 117 IU/L   AST 18 0 - 40 IU/L   ALT 19 0 - 32 IU/L  Lipid panel  Result Value Ref Range   Cholesterol, Total 203 (H) 100 - 199 mg/dL   Triglycerides 147 0 - 149 mg/dL   HDL 56 >39 mg/dL   VLDL Cholesterol Cal 29 5 - 40 mg/dL   LDL Calculated 118 (H) 0 - 99 mg/dL   Chol/HDL Ratio 3.6 0.0 - 4.4 ratio  Bayer DCA Hb A1c Waived  Result Value Ref Range   HB A1C (BAYER DCA - WAIVED) 6.0 <7.0 %  TSH  Result Value Ref Range   TSH 0.784  0.450 - 4.500 uIU/mL      Assessment & Plan:   1. Chronic pain due to neoplasm - gabapentin (NEURONTIN) 600 MG tablet; Take 1 tablet (600 mg total) by mouth 3 (three) times daily.  Dispense: 90 tablet; Refill: 5 - ToxASSURE Select 13 (MW), Urine - HYDROcodone-acetaminophen (NORCO) 10-325 MG tablet; Take 1 tablet by mouth every 8 (eight) hours as needed.  Dispense: 90 tablet; Refill: 0 - HYDROcodone-acetaminophen (NORCO) 10-325 MG tablet; Take 1 tablet by mouth every 8 (eight) hours as needed.  Dispense: 90 tablet; Refill: 0 - HYDROcodone-acetaminophen (NORCO) 10-325 MG tablet; Take 1 tablet by mouth every 8 (eight) hours as needed.  Dispense: 90 tablet; Refill: 0  2. Neuropathy - gabapentin (NEURONTIN) 600 MG tablet; Take 1 tablet (600 mg total) by mouth 3 (three) times daily.  Dispense: 90 tablet; Refill: 5 - ToxASSURE Select 13 (MW), Urine - HYDROcodone-acetaminophen (NORCO) 10-325 MG tablet; Take 1 tablet by  mouth every 8 (eight) hours as needed.  Dispense: 90 tablet; Refill: 0  3. Arthritis of knee, left - ToxASSURE Select 13 (MW), Urine - HYDROcodone-acetaminophen (NORCO) 10-325 MG tablet; Take 1 tablet by mouth every 8 (eight) hours as needed.  Dispense: 90 tablet; Refill: 0   Continue all other maintenance medications as listed above.  Follow up plan: Return in about 3 months (around 05/19/2018) for recheck.  Educational handout given for [place] PA-C [place] [address]  [place], [place] [address] [phone]   02/18/2018, 4:40 PM

## 2018-02-18 NOTE — Telephone Encounter (Signed)
Spoke to pt. And let her know we will send her symbicort 160/4.5 mcg [facility] [place] [facility] day supply will include her coupon . Instructed pt. To make sure she uses her spacer device and pt. Stated she will.

## 2018-02-20 LAB — TOXASSURE SELECT 13 (MW), URINE

## 2018-02-23 ENCOUNTER — Other Ambulatory Visit: Payer: Self-pay | Admitting: Physician Assistant

## 2018-02-23 DIAGNOSIS — G893 Neoplasm related pain (acute) (chronic): Secondary | ICD-10-CM

## 2018-03-04 ENCOUNTER — Telehealth: Payer: Self-pay | Admitting: Allergy and Immunology

## 2018-03-04 NOTE — Telephone Encounter (Signed)
LM for pt to call office back to advise her to contact her insurance company or call her pharmacy to see if they know which medication would be less espensive for the patient due to her having a commercial/generic insurance.

## 2018-03-04 NOTE — Telephone Encounter (Signed)
Pt informed by Tobie Lords. to call pharmacy or insurance company to see if they cover anything cheaper than Symbicort

## 2018-03-04 NOTE — Telephone Encounter (Signed)
PT called in to say [facility] and prices went down from $1100 down to $272, but she cannot afford that prices on a monthly basis. Would like to see if generic is covered by insurance, or alternative.

## 2018-03-08 NOTE — Progress Notes (Signed)
Subjective:  Patient ID: Heidi Walls, female    DOB: November 29, 1955,  MRN: [id number]  Chief Complaint  Patient presents with  . Foot Injury    Pt states right foot injury feels the same, but hurts worse with bad weather. Pt states pain is located in right dorsal foot. No other concerns.    63 y.o. female presents with the above complaint.  Had her CT performed here for review.  Review of Systems: Negative except as noted in the HPI. Denies N/V/F/Ch.  Past Medical History:  Diagnosis Date  . Anemia   . Anxiety    OCD  . Arthritis    knees and hips  . Asthma   . Breast cancer ([place])   . Breast cancer of upper-inner quadrant of left female breast ([place]) 05/26/2015  . GERD (gastroesophageal reflux disease)   . Hypertension   . Insomnia     Current Outpatient Medications:  .  acetaminophen (TYLENOL) 325 MG tablet, Take 650 mg by mouth daily., Disp: , Rfl:  .  albuterol (PROAIR HFA) 108 (90 Base) MCG/ACT inhaler, Inhale 2 puffs into the lungs every 4 (four) hours as needed for wheezing or shortness of breath., Disp: 1 Inhaler, Rfl: 2 .  alendronate (FOSAMAX) 70 MG tablet, TAKE 1 TABLET BY MOUTH ONCE WEEKLY WITH A FULL GLASS OF WATER ON AN EMPTY STOMACH, Disp: 12 tablet, Rfl: 0 .  ALPRAZolam (XANAX) 0.5 MG tablet, Take 1 tablet (0.5 mg total) by mouth 3 (three) times daily as needed., Disp: 90 tablet, Rfl: 5 .  Azelastine HCl 0.15 % SOLN, One spray each nostril 1-2 times a day as needed, Disp: 30 mL, Rfl: 5 .  B Complex-C (SUPER B COMPLEX PO), Take 1 tablet by mouth daily., Disp: , Rfl:  .  Calcium Carb-Cholecalciferol (CALCIUM 600 + D PO), Take 1 tablet by mouth daily., Disp: , Rfl:  .  Carbinoxamine Maleate 4 MG TABS, TAKE 1 TABLET EVERY 8 HOURS AS NEEDED FOR RUNNY NOSE OR ITCHING., Disp: 270 tablet, Rfl: 1 .  DULoxetine (CYMBALTA) 60 MG capsule, Take 1 capsule (60 mg total) by mouth daily., Disp: 90 capsule, Rfl: 3 .  esomeprazole (NEXIUM) 20 MG capsule, Take 20 mg by mouth daily. ,  Disp: , Rfl:  .  fluconazole (DIFLUCAN) 150 MG tablet, 1 po q week x 4 weeks, Disp: 4 tablet, Rfl: 11 .  FOLIC ACID PO, Take 1 tablet by mouth daily., Disp: , Rfl:  .  letrozole (FEMARA) 2.5 MG tablet, Take 1 tablet (2.5 mg total) by mouth daily., Disp: 90 tablet, Rfl: 3 .  montelukast (SINGULAIR) 10 MG tablet, Take 1 tablet (10 mg total) by mouth at bedtime., Disp: 30 tablet, Rfl: 5 .  Multiple Vitamin (MULTIVITAMIN) tablet, Take 1 tablet by mouth daily., Disp: , Rfl:  .  olmesartan-hydrochlorothiazide (BENICAR HCT) 20-12.5 MG tablet, Take 1 tablet by mouth daily., Disp: 90 tablet, Rfl: 3 .  olopatadine (PATANOL) 0.1 % ophthalmic solution, Place 1 drop into both eyes 2 (two) times daily., Disp: 5 mL, Rfl: 12 .  Omega-3 Fatty Acids (FISH OIL) 1200 MG CAPS, Take 1,200 mg by mouth daily., Disp: , Rfl:  .  Tiotropium Bromide Monohydrate (SPIRIVA RESPIMAT) 1.25 MCG/ACT AERS, Inhale 2 puffs into the lungs daily., Disp: 4 g, Rfl: 5 .  traMADol (ULTRAM) 50 MG tablet, Take 1 tablet (50 mg total) by mouth every 6 (six) hours as needed for severe pain., Disp: 120 tablet, Rfl: 2 .  valACYclovir (VALTREX) 1000  MG tablet, Take 1,000 mg by mouth as needed. , Disp: , Rfl: 6 .  budesonide-formoterol (SYMBICORT) 160-4.5 MCG/ACT inhaler, 2 puffs twice daily with spacer device to prevent coughing or wheezing., Disp: 3 Inhaler, Rfl: 3 .  gabapentin (NEURONTIN) 600 MG tablet, Take 1 tablet (600 mg total) by mouth 3 (three) times daily., Disp: 90 tablet, Rfl: 5 .  HYDROcodone-acetaminophen (NORCO) 10-325 MG tablet, Take 1 tablet by mouth every 8 (eight) hours as needed., Disp: 90 tablet, Rfl: 0 .  HYDROcodone-acetaminophen (NORCO) 10-325 MG tablet, Take 1 tablet by mouth every 8 (eight) hours as needed., Disp: 90 tablet, Rfl: 0 .  HYDROcodone-acetaminophen (NORCO) 10-325 MG tablet, Take 1 tablet by mouth every 8 (eight) hours as needed., Disp: 90 tablet, Rfl: 0  Social History   Tobacco Use  Smoking Status Former  Smoker  . Packs/day: 1.00  . Years: 25.00  . Pack years: 25.00  . Types: Cigarettes  . Last attempt to quit: 10/09/2005  . Years since quitting: 12.4  Smokeless Tobacco Never Used    Allergies  Allergen Reactions  . Codeine Shortness Of Breath    Pt reports this was 30 years ago.   . Nsaids   . Lisinopril Cough  . Sulfamethoxazole    Objective:   There were no vitals filed for this visit. There is no height or weight on file to calculate BMI. Constitutional Well developed. Well nourished.  Vascular Dorsalis pedis pulses palpable bilaterally. Posterior tibial pulses palpable bilaterally. Capillary refill normal to all digits.  No cyanosis or clubbing noted. Pedal hair growth normal.  Neurologic Normal speech. Oriented to person, place, and time. Epicritic sensation to light touch grossly present bilaterally.  Dermatologic Nails well groomed and normal in appearance. No open wounds. No skin lesions.  Orthopedic: POP L 3rd interspace with Mulder's click. Pain with patient about the right fourth second metatarsal base, fourth metatarsal neck Pain contusion left lateral dorsal foot   Radiographs: None  CT: Subacute, healing stress fracture at the base of the second metatarsal shaft. Assessment:   1. Displaced fracture of second metatarsal bone, right foot, subsequent encounter for fracture with routine healing    Plan:  Patient was evaluated and treated and all questions answered.  Right fracture of second metatarsal base, right fourth metatarsal neck -CT reviewed healing fracture -Continue nonoperative therapy.  Continue weight-bear as tolerated in cam boot. -Follow-up 1 month for recheck  No follow-ups on file.

## 2018-03-13 ENCOUNTER — Other Ambulatory Visit: Payer: Self-pay | Admitting: Podiatry

## 2018-03-13 ENCOUNTER — Ambulatory Visit (INDEPENDENT_AMBULATORY_CARE_PROVIDER_SITE_OTHER): Payer: PRIVATE HEALTH INSURANCE

## 2018-03-13 ENCOUNTER — Ambulatory Visit (INDEPENDENT_AMBULATORY_CARE_PROVIDER_SITE_OTHER): Payer: PRIVATE HEALTH INSURANCE | Admitting: Podiatry

## 2018-03-13 ENCOUNTER — Encounter: Payer: Self-pay | Admitting: Podiatry

## 2018-03-13 ENCOUNTER — Other Ambulatory Visit: Payer: Self-pay | Admitting: Physician Assistant

## 2018-03-13 DIAGNOSIS — G629 Polyneuropathy, unspecified: Secondary | ICD-10-CM

## 2018-03-13 DIAGNOSIS — S92321D Displaced fracture of second metatarsal bone, right foot, subsequent encounter for fracture with routine healing: Secondary | ICD-10-CM | POA: Diagnosis not present

## 2018-03-13 DIAGNOSIS — G893 Neoplasm related pain (acute) (chronic): Secondary | ICD-10-CM

## 2018-03-13 NOTE — Progress Notes (Signed)
Subjective:  Patient ID: Heidi Walls, female    DOB: 1955-01-27,  MRN: [id number]  Chief Complaint  Patient presents with  . Foot Injury    R-follow up; "doing better with little tenderness every now and again; no other concerns"    63 y.o. female presents with the above complaint.  History as above.  Review of Systems: Negative except as noted in the HPI. Denies N/V/F/Ch.  Past Medical History:  Diagnosis Date  . Anemia   . Anxiety    OCD  . Arthritis    knees and hips  . Asthma   . Breast cancer ([place])   . Breast cancer of upper-inner quadrant of left female breast ([place]) 05/26/2015  . GERD (gastroesophageal reflux disease)   . Hypertension   . Insomnia     Current Outpatient Medications:  .  acetaminophen (TYLENOL) 325 MG tablet, Take 650 mg by mouth daily., Disp: , Rfl:  .  albuterol (PROAIR HFA) 108 (90 Base) MCG/ACT inhaler, Inhale 2 puffs into the lungs every 4 (four) hours as needed for wheezing or shortness of breath., Disp: 1 Inhaler, Rfl: 2 .  alendronate (FOSAMAX) 70 MG tablet, TAKE 1 TABLET BY MOUTH ONCE WEEKLY WITH A FULL GLASS OF WATER ON AN EMPTY STOMACH, Disp: 12 tablet, Rfl: 0 .  ALPRAZolam (XANAX) 0.5 MG tablet, Take 1 tablet (0.5 mg total) by mouth 3 (three) times daily as needed., Disp: 90 tablet, Rfl: 5 .  Azelastine HCl 0.15 % SOLN, One spray each nostril 1-2 times a day as needed, Disp: 30 mL, Rfl: 5 .  B Complex-C (SUPER B COMPLEX PO), Take 1 tablet by mouth daily., Disp: , Rfl:  .  budesonide-formoterol (SYMBICORT) 160-4.5 MCG/ACT inhaler, 2 puffs twice daily with spacer device to prevent coughing or wheezing., Disp: 3 Inhaler, Rfl: 3 .  Calcium Carb-Cholecalciferol (CALCIUM 600 + D PO), Take 1 tablet by mouth daily., Disp: , Rfl:  .  Carbinoxamine Maleate 4 MG TABS, TAKE 1 TABLET EVERY 8 HOURS AS NEEDED FOR RUNNY NOSE OR ITCHING., Disp: 270 tablet, Rfl: 1 .  DULoxetine (CYMBALTA) 60 MG capsule, Take 1 capsule (60 mg total) by mouth daily., Disp: 90  capsule, Rfl: 3 .  esomeprazole (NEXIUM) 20 MG capsule, Take 20 mg by mouth daily. , Disp: , Rfl:  .  fluconazole (DIFLUCAN) 150 MG tablet, 1 po q week x 4 weeks, Disp: 4 tablet, Rfl: 11 .  FOLIC ACID PO, Take 1 tablet by mouth daily., Disp: , Rfl:  .  gabapentin (NEURONTIN) 600 MG tablet, Take 1 tablet (600 mg total) by mouth 3 (three) times daily., Disp: 90 tablet, Rfl: 5 .  HYDROcodone-acetaminophen (NORCO) 10-325 MG tablet, Take 1 tablet by mouth every 8 (eight) hours as needed., Disp: 90 tablet, Rfl: 0 .  HYDROcodone-acetaminophen (NORCO) 10-325 MG tablet, Take 1 tablet by mouth every 8 (eight) hours as needed., Disp: 90 tablet, Rfl: 0 .  HYDROcodone-acetaminophen (NORCO) 10-325 MG tablet, Take 1 tablet by mouth every 8 (eight) hours as needed., Disp: 90 tablet, Rfl: 0 .  letrozole (FEMARA) 2.5 MG tablet, Take 1 tablet (2.5 mg total) by mouth daily., Disp: 90 tablet, Rfl: 3 .  montelukast (SINGULAIR) 10 MG tablet, Take 1 tablet (10 mg total) by mouth at bedtime., Disp: 30 tablet, Rfl: 5 .  Multiple Vitamin (MULTIVITAMIN) tablet, Take 1 tablet by mouth daily., Disp: , Rfl:  .  olmesartan-hydrochlorothiazide (BENICAR HCT) 20-12.5 MG tablet, Take 1 tablet by mouth daily., Disp: 90 tablet,  Rfl: 3 .  olopatadine (PATANOL) 0.1 % ophthalmic solution, Place 1 drop into both eyes 2 (two) times daily., Disp: 5 mL, Rfl: 12 .  Omega-3 Fatty Acids (FISH OIL) 1200 MG CAPS, Take 1,200 mg by mouth daily., Disp: , Rfl:  .  Tiotropium Bromide Monohydrate (SPIRIVA RESPIMAT) 1.25 MCG/ACT AERS, Inhale 2 puffs into the lungs daily., Disp: 4 g, Rfl: 5 .  traMADol (ULTRAM) 50 MG tablet, Take 1 tablet (50 mg total) by mouth every 6 (six) hours as needed for severe pain., Disp: 120 tablet, Rfl: 2 .  valACYclovir (VALTREX) 1000 MG tablet, Take 1,000 mg by mouth as needed. , Disp: , Rfl: 6  Social History   Tobacco Use  Smoking Status Former Smoker  . Packs/day: 1.00  . Years: 25.00  . Pack years: 25.00  . Types:  Cigarettes  . Last attempt to quit: 10/09/2005  . Years since quitting: 12.4  Smokeless Tobacco Never Used    Allergies  Allergen Reactions  . Codeine Shortness Of Breath    Pt reports this was 30 years ago.   . Nsaids   . Lisinopril Cough  . Sulfamethoxazole    Objective:   There were no vitals filed for this visit. There is no height or weight on file to calculate BMI. Constitutional Well developed. Well nourished.  Vascular Dorsalis pedis pulses palpable bilaterally. Posterior tibial pulses palpable bilaterally. Capillary refill normal to all digits.  No cyanosis or clubbing noted. Pedal hair growth normal.  Neurologic Normal speech. Oriented to person, place, and time. Epicritic sensation to light touch grossly present bilaterally.  Dermatologic Nails well groomed and normal in appearance. No open wounds. No skin lesions.  Orthopedic: POP L 3rd interspace with Mulder's click. Pain with patient about the right 2nd metatarsal base   Radiographs: Taken and reviewed interval healing not fully consolidated. Assessment:   1. Displaced fracture of second metatarsal bone, right foot, subsequent encounter for fracture with routine healing    Plan:  Patient was evaluated and treated and all questions answered.  Right fracture of second metatarsal base, right fourth metatarsal neck -XR taken as above. -Continue surgical shoe for 4 more weeks. -New shoe dispensed. The one she has is quite worn.  No follow-ups on file.

## 2018-03-17 ENCOUNTER — Telehealth: Payer: Self-pay | Admitting: Physician Assistant

## 2018-03-17 NOTE — Telephone Encounter (Signed)
Patient will call us and let us know the information for [facility]. She wants to know if we can send all her medications to them.

## 2018-03-18 ENCOUNTER — Other Ambulatory Visit: Payer: Self-pay | Admitting: Allergy and Immunology

## 2018-03-18 DIAGNOSIS — K219 Gastro-esophageal reflux disease without esophagitis: Secondary | ICD-10-CM

## 2018-03-24 ENCOUNTER — Other Ambulatory Visit: Payer: Self-pay | Admitting: Physician Assistant

## 2018-03-24 DIAGNOSIS — J3089 Other allergic rhinitis: Secondary | ICD-10-CM

## 2018-03-24 DIAGNOSIS — F331 Major depressive disorder, recurrent, moderate: Secondary | ICD-10-CM

## 2018-03-24 DIAGNOSIS — G629 Polyneuropathy, unspecified: Secondary | ICD-10-CM

## 2018-03-25 MED ORDER — MONTELUKAST SODIUM 10 MG PO TABS: 10.0000 mg | ORAL_TABLET | Freq: Every day | ORAL | 3 refills | Status: DC

## 2018-03-25 MED ORDER — AZELASTINE HCL 0.15 % NA SOLN: NASAL | 5 refills | Status: DC

## 2018-03-25 MED ORDER — OLMESARTAN MEDOXOMIL-HCTZ 20-12.5 MG PO TABS: 1.0000 | ORAL_TABLET | Freq: Every day | ORAL | 3 refills | Status: DC

## 2018-03-25 MED ORDER — ESOMEPRAZOLE MAGNESIUM 20 MG PO CPDR: 20.0000 mg | DELAYED_RELEASE_CAPSULE | Freq: Every day | ORAL | 3 refills | Status: DC

## 2018-03-25 MED ORDER — DULOXETINE HCL 60 MG PO CPEP: 60.0000 mg | ORAL_CAPSULE | Freq: Every day | ORAL | 3 refills | Status: DC

## 2018-03-25 MED ORDER — OLOPATADINE HCL 0.1 % OP SOLN: 1.0000 [drp] | Freq: Two times a day (BID) | OPHTHALMIC | 3 refills | Status: DC

## 2018-03-25 MED ORDER — ALENDRONATE SODIUM 70 MG PO TABS: 70.0000 mg | ORAL_TABLET | ORAL | 3 refills | Status: DC

## 2018-03-25 MED ORDER — ALBUTEROL SULFATE HFA 108 (90 BASE) MCG/ACT IN AERS: 2.0000 | INHALATION_SPRAY | RESPIRATORY_TRACT | 3 refills | Status: DC | PRN

## 2018-03-25 MED ORDER — FLUCONAZOLE 150 MG PO TABS: ORAL_TABLET | ORAL | 3 refills | Status: DC

## 2018-03-26 MED ORDER — AZELASTINE HCL 0.15 % NA SOLN: NASAL | 5 refills | Status: DC

## 2018-03-26 MED ORDER — OLOPATADINE HCL 0.1 % OP SOLN: 1.0000 [drp] | Freq: Two times a day (BID) | OPHTHALMIC | 3 refills | Status: DC

## 2018-03-26 MED ORDER — FLUCONAZOLE 150 MG PO TABS: ORAL_TABLET | ORAL | 3 refills | Status: DC

## 2018-03-26 NOTE — Addendum Note (Signed)
Addended by: Wardell Heath on: 03/26/2018 12:01 PM   Modules accepted: Orders

## 2018-03-26 NOTE — Addendum Note (Signed)
Addended by: Wardell Heath on: 03/26/2018 11:49 AM   Modules accepted: Orders

## 2018-04-05 ENCOUNTER — Other Ambulatory Visit: Payer: Self-pay | Admitting: Physician Assistant

## 2018-04-05 DIAGNOSIS — F411 Generalized anxiety disorder: Secondary | ICD-10-CM

## 2018-04-07 NOTE — Telephone Encounter (Signed)
Last seen 02/18/2018

## 2018-04-10 ENCOUNTER — Ambulatory Visit (INDEPENDENT_AMBULATORY_CARE_PROVIDER_SITE_OTHER): Payer: PRIVATE HEALTH INSURANCE | Admitting: Podiatry

## 2018-04-10 ENCOUNTER — Ambulatory Visit (INDEPENDENT_AMBULATORY_CARE_PROVIDER_SITE_OTHER): Payer: PRIVATE HEALTH INSURANCE

## 2018-04-10 ENCOUNTER — Encounter: Payer: Self-pay | Admitting: Podiatry

## 2018-04-10 ENCOUNTER — Other Ambulatory Visit: Payer: Self-pay

## 2018-04-10 VITALS — Temp 97.3°F

## 2018-04-10 DIAGNOSIS — S92321D Displaced fracture of second metatarsal bone, right foot, subsequent encounter for fracture with routine healing: Secondary | ICD-10-CM | POA: Diagnosis not present

## 2018-04-10 DIAGNOSIS — G5763 Lesion of plantar nerve, bilateral lower limbs: Secondary | ICD-10-CM

## 2018-04-10 NOTE — Progress Notes (Signed)
Subjective:  Patient ID: Heidi Walls, female    DOB: [birth year],  MRN: [id number]  Chief Complaint  Patient presents with  . Fracture    Follow up fracture 2nd and 4th metatarsal right   "Its much better"    63 y.o. female presents with the above complaint.  History as above.  Also complains of ball of foot pain. States injection in past have helped.  Review of Systems: Negative except as noted in the HPI. Denies N/V/F/Ch.  Past Medical History:  Diagnosis Date  . Anemia   . Anxiety    OCD  . Arthritis    knees and hips  . Asthma   . Breast cancer ([place])   . Breast cancer of upper-inner quadrant of left female breast ([place]) 05/26/2015  . GERD (gastroesophageal reflux disease)   . Hypertension   . Insomnia     Current Outpatient Medications:  .  acetaminophen (TYLENOL) 325 MG tablet, Take 650 mg by mouth daily., Disp: , Rfl:  .  albuterol (PROAIR HFA) 108 (90 Base) MCG/ACT inhaler, Inhale 2 puffs into the lungs every 4 (four) hours as needed for wheezing or shortness of breath., Disp: 3 Inhaler, Rfl: 3 .  alendronate (FOSAMAX) 70 MG tablet, Take 1 tablet (70 mg total) by mouth every 7 (seven) days. Take with a full glass of water on an empty stomach., Disp: 12 tablet, Rfl: 3 .  ALPRAZolam (XANAX) 0.5 MG tablet, TAKE 1 TABLET (0.5 MG TOTAL) BY MOUTH 3 (THREE) TIMES DAILY AS NEEDED., Disp: 90 tablet, Rfl: 0 .  Azelastine HCl 0.15 % SOLN, One spray each nostril 1-2 times a day as needed, Disp: 30 mL, Rfl: 5 .  B Complex-C (SUPER B COMPLEX PO), Take 1 tablet by mouth daily., Disp: , Rfl:  .  budesonide-formoterol (SYMBICORT) 160-4.5 MCG/ACT inhaler, 2 puffs twice daily with spacer device to prevent coughing or wheezing., Disp: 3 Inhaler, Rfl: 3 .  Calcium Carb-Cholecalciferol (CALCIUM 600 + D PO), Take 1 tablet by mouth daily., Disp: , Rfl:  .  Carbinoxamine Maleate 4 MG TABS, TAKE 1 TABLET EVERY 8 HOURS AS NEEDED FOR RUNNY NOSE OR ITCHING., Disp: 270 tablet, Rfl: 1 .  DULoxetine  (CYMBALTA) 60 MG capsule, Take 1 capsule (60 mg total) by mouth daily., Disp: 90 capsule, Rfl: 3 .  esomeprazole (NEXIUM) 20 MG capsule, Take 1 capsule (20 mg total) by mouth daily., Disp: 90 capsule, Rfl: 3 .  fluconazole (DIFLUCAN) 150 MG tablet, 1 po q week x 4 weeks, Disp: 12 tablet, Rfl: 3 .  FOLIC ACID PO, Take 1 tablet by mouth daily., Disp: , Rfl:  .  gabapentin (NEURONTIN) 600 MG tablet, TAKE 1 TABLET BY MOUTH THREE TIMES A DAY, Disp: 270 tablet, Rfl: 1 .  HYDROcodone-acetaminophen (NORCO) 10-325 MG tablet, Take 1 tablet by mouth every 8 (eight) hours as needed., Disp: 90 tablet, Rfl: 0 .  HYDROcodone-acetaminophen (NORCO) 10-325 MG tablet, Take 1 tablet by mouth every 8 (eight) hours as needed., Disp: 90 tablet, Rfl: 0 .  HYDROcodone-acetaminophen (NORCO) 10-325 MG tablet, Take 1 tablet by mouth every 8 (eight) hours as needed., Disp: 90 tablet, Rfl: 0 .  letrozole (FEMARA) 2.5 MG tablet, Take 1 tablet (2.5 mg total) by mouth daily., Disp: 90 tablet, Rfl: 3 .  montelukast (SINGULAIR) 10 MG tablet, Take 1 tablet (10 mg total) by mouth at bedtime., Disp: 90 tablet, Rfl: 3 .  Multiple Vitamin (MULTIVITAMIN) tablet, Take 1 tablet by mouth daily., Disp: , Rfl:  .  olmesartan-hydrochlorothiazide (BENICAR HCT) 20-12.5 MG tablet, Take 1 tablet by mouth daily., Disp: 90 tablet, Rfl: 3 .  olopatadine (PATANOL) 0.1 % ophthalmic solution, Place 1 drop into both eyes 2 (two) times daily., Disp: 15 mL, Rfl: 3 .  Omega-3 Fatty Acids (FISH OIL) 1200 MG CAPS, Take 1,200 mg by mouth daily., Disp: , Rfl:  .  Tiotropium Bromide Monohydrate (SPIRIVA RESPIMAT) 1.25 MCG/ACT AERS, Inhale 2 puffs into the lungs daily., Disp: 4 g, Rfl: 5 .  traMADol (ULTRAM) 50 MG tablet, Take 1 tablet (50 mg total) by mouth every 6 (six) hours as needed for severe pain., Disp: 120 tablet, Rfl: 2 .  valACYclovir (VALTREX) 1000 MG tablet, Take 1,000 mg by mouth as needed. , Disp: , Rfl: 6  Social History   Tobacco Use  Smoking  Status Former Smoker  . Packs/day: 1.00  . Years: 25.00  . Pack years: 25.00  . Types: Cigarettes  . Last attempt to quit: 10/09/2005  . Years since quitting: 12.5  Smokeless Tobacco Never Used    Allergies  Allergen Reactions  . Codeine Shortness Of Breath    Pt reports this was 30 years ago.   . Nsaids   . Lisinopril Cough  . Sulfamethoxazole    Objective:   Vitals:   04/10/18 0835  Temp: (!) 97.3 F (36.3 C)   There is no height or weight on file to calculate BMI. Constitutional Well developed. Well nourished.  Vascular Dorsalis pedis pulses palpable bilaterally. Posterior tibial pulses palpable bilaterally. Capillary refill normal to all digits.  No cyanosis or clubbing noted. Pedal hair growth normal.  Neurologic Normal speech. Oriented to person, place, and time. Epicritic sensation to light touch grossly present bilaterally.  Dermatologic Nails well groomed and normal in appearance. No open wounds. No skin lesions.  Orthopedic: POP bilateral third interspace Point tenderness left second metatarsal base   Radiographs: Taken and reviewed still fracture line evident but does appear to be consolidating Assessment:   1. Displaced fracture of second metatarsal bone, right foot, subsequent encounter for fracture with routine healing   2. Morton's metatarsalgia, neuralgia, or neuroma, bilateral    Plan:  Patient was evaluated and treated and all questions answered.  Right fracture of base base, right fourth metatarsal neck -XR taken as above. -Trial transition out of shoe into normal shoegear. Return to sx shoe should she experience  Interdigital Neuroma, bilateral -Interspace injection delivered as below. -Educated on padding and proper shoegear  Procedure: Neuroma Injection Location: Bilateral 3rd interspace Skin Prep: Alcohol. Injectate: 0.5 cc 0.5% marcaine plain, 0.5 cc dexamethasone phosphate. Disposition: Patient tolerated procedure well. Injection  site dressed with a band-aid.  Return in about 4 weeks (around 05/08/2018) for Neuroma bilat, fracture f/u with XRs.

## 2018-04-14 ENCOUNTER — Ambulatory Visit: Payer: PRIVATE HEALTH INSURANCE | Admitting: Physician Assistant

## 2018-04-15 ENCOUNTER — Ambulatory Visit: Payer: PRIVATE HEALTH INSURANCE | Admitting: Physician Assistant

## 2018-05-08 ENCOUNTER — Other Ambulatory Visit: Payer: Self-pay

## 2018-05-08 ENCOUNTER — Ambulatory Visit (INDEPENDENT_AMBULATORY_CARE_PROVIDER_SITE_OTHER): Payer: PRIVATE HEALTH INSURANCE | Admitting: Podiatry

## 2018-05-08 ENCOUNTER — Encounter: Payer: Self-pay | Admitting: Podiatry

## 2018-05-08 ENCOUNTER — Ambulatory Visit (INDEPENDENT_AMBULATORY_CARE_PROVIDER_SITE_OTHER): Payer: PRIVATE HEALTH INSURANCE

## 2018-05-08 DIAGNOSIS — S92321D Displaced fracture of second metatarsal bone, right foot, subsequent encounter for fracture with routine healing: Secondary | ICD-10-CM

## 2018-05-08 NOTE — Progress Notes (Signed)
Subjective:  Patient ID: Heidi Walls, female    DOB: 10-17-1955,  MRN: [id number]  Chief Complaint  Patient presents with  . Foot Injury    R foot follow up; "doing alot better with little pain some days; no other concerns"    63 y.o. female presents with the above complaint.  History as above.  Also complains of ball of foot pain. States the foot has good days and bad days, mostly when she overdoes it. Wearing normal shoegear without complaints.  Review of Systems: Negative except as noted in the HPI. Denies N/V/F/Ch.  Past Medical History:  Diagnosis Date  . Anemia   . Anxiety    OCD  . Arthritis    knees and hips  . Asthma   . Breast cancer ([place])   . Breast cancer of upper-inner quadrant of left female breast ([place]) 05/26/2015  . GERD (gastroesophageal reflux disease)   . Hypertension   . Insomnia     Current Outpatient Medications:  .  acetaminophen (TYLENOL) 325 MG tablet, Take 650 mg by mouth daily., Disp: , Rfl:  .  albuterol (PROAIR HFA) 108 (90 Base) MCG/ACT inhaler, Inhale 2 puffs into the lungs every 4 (four) hours as needed for wheezing or shortness of breath., Disp: 3 Inhaler, Rfl: 3 .  alendronate (FOSAMAX) 70 MG tablet, Take 1 tablet (70 mg total) by mouth every 7 (seven) days. Take with a full glass of water on an empty stomach., Disp: 12 tablet, Rfl: 3 .  ALPRAZolam (XANAX) 0.5 MG tablet, TAKE 1 TABLET (0.5 MG TOTAL) BY MOUTH 3 (THREE) TIMES DAILY AS NEEDED., Disp: 90 tablet, Rfl: 0 .  Azelastine HCl 0.15 % SOLN, One spray each nostril 1-2 times a day as needed, Disp: 30 mL, Rfl: 5 .  B Complex-C (SUPER B COMPLEX PO), Take 1 tablet by mouth daily., Disp: , Rfl:  .  budesonide-formoterol (SYMBICORT) 160-4.5 MCG/ACT inhaler, 2 puffs twice daily with spacer device to prevent coughing or wheezing., Disp: 3 Inhaler, Rfl: 3 .  Calcium Carb-Cholecalciferol (CALCIUM 600 + D PO), Take 1 tablet by mouth daily., Disp: , Rfl:  .  Carbinoxamine Maleate 4 MG TABS, TAKE 1  TABLET EVERY 8 HOURS AS NEEDED FOR RUNNY NOSE OR ITCHING., Disp: 270 tablet, Rfl: 1 .  DULoxetine (CYMBALTA) 60 MG capsule, Take 1 capsule (60 mg total) by mouth daily., Disp: 90 capsule, Rfl: 3 .  esomeprazole (NEXIUM) 20 MG capsule, Take 1 capsule (20 mg total) by mouth daily., Disp: 90 capsule, Rfl: 3 .  fluconazole (DIFLUCAN) 150 MG tablet, 1 po q week x 4 weeks, Disp: 12 tablet, Rfl: 3 .  FOLIC ACID PO, Take 1 tablet by mouth daily., Disp: , Rfl:  .  gabapentin (NEURONTIN) 600 MG tablet, TAKE 1 TABLET BY MOUTH THREE TIMES A DAY, Disp: 270 tablet, Rfl: 1 .  HYDROcodone-acetaminophen (NORCO) 10-325 MG tablet, Take 1 tablet by mouth every 8 (eight) hours as needed., Disp: 90 tablet, Rfl: 0 .  HYDROcodone-acetaminophen (NORCO) 10-325 MG tablet, Take 1 tablet by mouth every 8 (eight) hours as needed., Disp: 90 tablet, Rfl: 0 .  HYDROcodone-acetaminophen (NORCO) 10-325 MG tablet, Take 1 tablet by mouth every 8 (eight) hours as needed., Disp: 90 tablet, Rfl: 0 .  letrozole (FEMARA) 2.5 MG tablet, Take 1 tablet (2.5 mg total) by mouth daily., Disp: 90 tablet, Rfl: 3 .  montelukast (SINGULAIR) 10 MG tablet, Take 1 tablet (10 mg total) by mouth at bedtime., Disp: 90 tablet, Rfl:  3 .  Multiple Vitamin (MULTIVITAMIN) tablet, Take 1 tablet by mouth daily., Disp: , Rfl:  .  olmesartan-hydrochlorothiazide (BENICAR HCT) 20-12.5 MG tablet, Take 1 tablet by mouth daily., Disp: 90 tablet, Rfl: 3 .  olopatadine (PATANOL) 0.1 % ophthalmic solution, Place 1 drop into both eyes 2 (two) times daily., Disp: 15 mL, Rfl: 3 .  Omega-3 Fatty Acids (FISH OIL) 1200 MG CAPS, Take 1,200 mg by mouth daily., Disp: , Rfl:  .  Tiotropium Bromide Monohydrate (SPIRIVA RESPIMAT) 1.25 MCG/ACT AERS, Inhale 2 puffs into the lungs daily., Disp: 4 g, Rfl: 5 .  traMADol (ULTRAM) 50 MG tablet, Take 1 tablet (50 mg total) by mouth every 6 (six) hours as needed for severe pain., Disp: 120 tablet, Rfl: 2 .  valACYclovir (VALTREX) 1000 MG  tablet, Take 1,000 mg by mouth as needed. , Disp: , Rfl: 6  Social History   Tobacco Use  Smoking Status Former Smoker  . Packs/day: 1.00  . Years: 25.00  . Pack years: 25.00  . Types: Cigarettes  . Last attempt to quit: 10/09/2005  . Years since quitting: 12.5  Smokeless Tobacco Never Used    Allergies  Allergen Reactions  . Codeine Shortness Of Breath    Pt reports this was 30 years ago.   . Nsaids   . Lisinopril Cough  . Sulfamethoxazole    Objective:   There were no vitals filed for this visit. There is no height or weight on file to calculate BMI. Constitutional Well developed. Well nourished.  Vascular Dorsalis pedis pulses palpable bilaterally. Posterior tibial pulses palpable bilaterally. Capillary refill normal to all digits.  No cyanosis or clubbing noted. Pedal hair growth normal.  Neurologic Normal speech. Oriented to person, place, and time. Epicritic sensation to light touch grossly present bilaterally.  Dermatologic Nails well groomed and normal in appearance. No open wounds. No skin lesions.  Orthopedic: Point tenderness right 2nd metatarsal base.   Radiographs: Taken and reviewed fracture appears bridged but incomplete. Assessment:   1. Displaced fracture of second metatarsal bone, right foot, subsequent encounter for fracture with routine healing    Plan:  Patient was evaluated and treated and all questions answered.  Right fracture of metatarsal base, right fourth metatarsal neck -XR taken as above. -Continue WBAT in normal shoe.  -Should pain persist would consider surgical revision of the fracture.  Return in about 2 months (around 07/08/2018) for Fracture f/u with XRs.

## 2018-05-08 NOTE — Assessment & Plan Note (Deleted)
Left simple mastectomy with reconstruction 06/24/2015: IDC grade 3, 2.5 cm, with high-grade DCIS, ALH, LVI present, margins negative, 0/1 sentinel node negative, , ER 100%, PR 5%, HER-2 positive ratio 1.56 with average copy #8.25, Ki-67 20%  Pathologic stage:T2 N0 stage II a Treatment plan: 1. Adjuvant chemotherapy with TCHP X 6 completed 10/30/17followed by Herceptin maintenance for 1 year which completed 08/07/2016 2. Followed by adjuvant hormonal therapy with anastrozole started 11/28/2015 ---------------------------------------------------------------------------------------------------------- Current treatment:  Echocardiogram 05/22/16 LVEF 60-65% Anastrozole started 11/28/15  Anastrozole Toxicities:  1.  Severe hot flashes 2. muscle stiffness and achiness  Memory loss  Breast cancer surveillance: Right breast mammogram 06/12/2017: Benign, breast density category C  Bone density test 04/12/2016: T score -2.2. Osteopenia. She is taking Fosamax and calcium and vitamin D.  Anxiety: On antianxiety medications  Return to clinic in 1 year for follow-up

## 2018-05-09 ENCOUNTER — Telehealth: Payer: Self-pay | Admitting: Hematology and Oncology

## 2018-05-09 ENCOUNTER — Other Ambulatory Visit: Payer: Self-pay | Admitting: Physician Assistant

## 2018-05-09 MED ORDER — FLUCONAZOLE 200 MG PO TABS: ORAL_TABLET | ORAL | 3 refills | Status: DC

## 2018-05-09 NOTE — Telephone Encounter (Signed)
Called patient regarding upcoming Webex appointment, left a voicemail and e-mail has been sent.  °

## 2018-05-12 ENCOUNTER — Telehealth: Payer: Self-pay | Admitting: Hematology and Oncology

## 2018-05-12 NOTE — Telephone Encounter (Signed)
Per 5/4 schedule message reschedule 5/5 webex to another time when patient can have face to face visit. Reschedule f/u to 6/30 and restored cancelled lab. Confirmed with patient and she wants to wait til June.

## 2018-05-13 ENCOUNTER — Other Ambulatory Visit: Payer: BLUE CROSS/BLUE SHIELD

## 2018-05-13 ENCOUNTER — Inpatient Hospital Stay: Payer: Self-pay | Admitting: Hematology and Oncology

## 2018-05-15 ENCOUNTER — Other Ambulatory Visit: Payer: Self-pay | Admitting: Physician Assistant

## 2018-05-15 DIAGNOSIS — F411 Generalized anxiety disorder: Secondary | ICD-10-CM

## 2018-05-21 ENCOUNTER — Other Ambulatory Visit: Payer: Self-pay | Admitting: Physician Assistant

## 2018-05-21 DIAGNOSIS — F411 Generalized anxiety disorder: Secondary | ICD-10-CM

## 2018-05-22 MED ORDER — ALPRAZOLAM 0.5 MG PO TABS: 0.5000 mg | ORAL_TABLET | Freq: Three times a day (TID) | ORAL | 0 refills | Status: DC | PRN

## 2018-05-22 NOTE — Addendum Note (Signed)
Addended by: Antonietta Barcelona D on: 05/22/2018 09:31 AM   Modules accepted: Orders

## 2018-05-22 NOTE — Telephone Encounter (Signed)
Pt called needing RF she had appt for 06/09/18 this has been moved to 05/27/18, she will run out before then has 6 pills left. She takes 1/2 in am, 1/2 at lunch and 1 at 8 pm Please advise

## 2018-05-26 ENCOUNTER — Telehealth: Payer: Self-pay | Admitting: *Deleted

## 2018-05-26 ENCOUNTER — Other Ambulatory Visit: Payer: Self-pay

## 2018-05-26 NOTE — Telephone Encounter (Signed)
Pt called stating her allergies are bad. She is having coughing and congestion. She did say she has been walking the past 2 days outside. She using Symbicort 2 puffs twice a day, albuterol 2 puffs daily. Which she did wake up in the middle of night and had to use albuterol. She is taking carbinoxamine, mucinex 600 daily, saline rinses, Astelin nasal spray, and Nexium. Recommended she take 1200 mg max strength mucinex with plenty of water and that if need be she can use her albuterol every 4-6 hours as needed. She did say her daughter was going to stop at the store and pick up prevacid for her to take. Pt will call back if no improvement. We could possibly schedule telemed visit.

## 2018-05-27 ENCOUNTER — Encounter: Payer: Self-pay | Admitting: Physician Assistant

## 2018-05-27 ENCOUNTER — Ambulatory Visit (INDEPENDENT_AMBULATORY_CARE_PROVIDER_SITE_OTHER): Payer: PRIVATE HEALTH INSURANCE | Admitting: Physician Assistant

## 2018-05-27 DIAGNOSIS — F411 Generalized anxiety disorder: Secondary | ICD-10-CM | POA: Diagnosis not present

## 2018-05-27 DIAGNOSIS — G893 Neoplasm related pain (acute) (chronic): Secondary | ICD-10-CM | POA: Diagnosis not present

## 2018-05-27 DIAGNOSIS — M25551 Pain in right hip: Secondary | ICD-10-CM

## 2018-05-27 DIAGNOSIS — G629 Polyneuropathy, unspecified: Secondary | ICD-10-CM

## 2018-05-27 DIAGNOSIS — M25552 Pain in left hip: Secondary | ICD-10-CM

## 2018-05-27 DIAGNOSIS — J4540 Moderate persistent asthma, uncomplicated: Secondary | ICD-10-CM

## 2018-05-27 DIAGNOSIS — J3089 Other allergic rhinitis: Secondary | ICD-10-CM

## 2018-05-27 MED ORDER — ALPRAZOLAM 0.5 MG PO TABS: 0.5000 mg | ORAL_TABLET | Freq: Three times a day (TID) | ORAL | 0 refills | Status: DC | PRN

## 2018-05-27 MED ORDER — ALPRAZOLAM 0.5 MG PO TABS: 0.5000 mg | ORAL_TABLET | Freq: Three times a day (TID) | ORAL | 5 refills | Status: DC | PRN

## 2018-05-27 MED ORDER — TRAMADOL HCL 50 MG PO TABS: 50.0000 mg | ORAL_TABLET | Freq: Four times a day (QID) | ORAL | 5 refills | Status: DC | PRN

## 2018-05-27 MED ORDER — DICLOFENAC SODIUM 1 % TD GEL: 4.0000 g | Freq: Four times a day (QID) | TRANSDERMAL | 11 refills | Status: DC

## 2018-05-27 NOTE — Progress Notes (Signed)
Telephone visit  Subjective: ON:GEXBMWU chronic medications PCP: Terald Sleeper, PA-C XLK:GMWNUU Heidi Walls is a 63 y.o. female calls for telephone consult today. Patient provides verbal consent for consult held via phone.  Patient is identified with 2 separate identifiers.  At this time the entire area is on COVID-19 social distancing and stay home orders are in place.  Patient is of higher risk and therefore we are performing this by a virtual method.  Location of patient: home Location of provider: [facility] Others present for call: no  ANXIETY ASSESSMENT Cause of anxiety: GAD This patient returns for a  month recheck on narcotic use for the above named condition(s)  Current medications- cymbalta Alprazolam 0.5 up to TID Other medications tried: multiple Medication side effects- no Any concerns- no Any change in general medical condition- no Effectiveness of current meds- good PMP AWARE website reviewed: Yes Any suspicious activity on PMP Aware: No LME daily dose: 3, intermittent  Discontinued hydrocodone Contract on file Last UDS  02/18/18  History of overdose or risk of abuse no  She needs recheck on hr other joint pains.  She has chronic knee and foot pain. She is increasing the gabapentin as tolerated.  At this time she is on 600 mg 3 times a day.  The worst area of pain are bilateral hips.  She states that both hurt equally bad.  She is still seeing the podiatrist for her stress fracture in her foot.  She has not been to an orthopedist for her hips.  She does have known osteoarthritis of both knees.  She is trying to do Epsom salt soaks and daily stretching and walking.  Her allergies and asthma are slightly flared up this spring.  So she has a little bit of a cough and some postnasal drainage.  ROS: Per HPI  Allergies  Allergen Reactions  . Codeine Shortness Of Breath    Pt reports this was 30 years ago.   . Nsaids   . Lisinopril Cough  . Sulfamethoxazole     Past Medical History:  Diagnosis Date  . Anemia   . Anxiety    OCD  . Arthritis    knees and hips  . Asthma   . Breast cancer ([place])   . Breast cancer of upper-inner quadrant of left female breast ([place]) 05/26/2015  . GERD (gastroesophageal reflux disease)   . Hypertension   . Insomnia     Current Outpatient Medications:  .  acetaminophen (TYLENOL) 325 MG tablet, Take 650 mg by mouth daily., Disp: , Rfl:  .  albuterol (PROAIR HFA) 108 (90 Base) MCG/ACT inhaler, Inhale 2 puffs into the lungs every 4 (four) hours as needed for wheezing or shortness of breath., Disp: 3 Inhaler, Rfl: 3 .  alendronate (FOSAMAX) 70 MG tablet, Take 1 tablet (70 mg total) by mouth every 7 (seven) days. Take with a full glass of water on an empty stomach., Disp: 12 tablet, Rfl: 3 .  ALPRAZolam (XANAX) 0.5 MG tablet, Take 1 tablet (0.5 mg total) by mouth 3 (three) times daily as needed., Disp: 90 tablet, Rfl: 5 .  Azelastine HCl 0.15 % SOLN, One spray each nostril 1-2 times a day as needed, Disp: 30 mL, Rfl: 5 .  B Complex-C (SUPER B COMPLEX PO), Take 1 tablet by mouth daily., Disp: , Rfl:  .  budesonide-formoterol (SYMBICORT) 160-4.5 MCG/ACT inhaler, 2 puffs twice daily with spacer device to prevent coughing or wheezing., Disp: 3 Inhaler, Rfl: 3 .  Calcium Carb-Cholecalciferol (CALCIUM 600 + D PO), Take 1 tablet by mouth daily., Disp: , Rfl:  .  Carbinoxamine Maleate 4 MG TABS, TAKE 1 TABLET EVERY 8 HOURS AS NEEDED FOR RUNNY NOSE OR ITCHING., Disp: 270 tablet, Rfl: 1 .  diclofenac sodium (VOLTAREN) 1 % GEL, Apply 4 g topically 4 (four) times daily., Disp: 400 g, Rfl: 11 .  DULoxetine (CYMBALTA) 60 MG capsule, Take 1 capsule (60 mg total) by mouth daily., Disp: 90 capsule, Rfl: 3 .  esomeprazole (NEXIUM) 20 MG capsule, Take 1 capsule (20 mg total) by mouth daily., Disp: 90 capsule, Rfl: 3 .  fluconazole (DIFLUCAN) 200 MG tablet, 1 po q week x 4 weeks, Disp: 12 tablet, Rfl: 3 .  FOLIC ACID PO, Take 1 tablet by mouth  daily., Disp: , Rfl:  .  gabapentin (NEURONTIN) 600 MG tablet, TAKE 1 TABLET BY MOUTH THREE TIMES A DAY, Disp: 270 tablet, Rfl: 1 .  letrozole (FEMARA) 2.5 MG tablet, Take 1 tablet (2.5 mg total) by mouth daily., Disp: 90 tablet, Rfl: 3 .  montelukast (SINGULAIR) 10 MG tablet, Take 1 tablet (10 mg total) by mouth at bedtime., Disp: 90 tablet, Rfl: 3 .  Multiple Vitamin (MULTIVITAMIN) tablet, Take 1 tablet by mouth daily., Disp: , Rfl:  .  olmesartan-hydrochlorothiazide (BENICAR HCT) 20-12.5 MG tablet, Take 1 tablet by mouth daily., Disp: 90 tablet, Rfl: 3 .  olopatadine (PATANOL) 0.1 % ophthalmic solution, Place 1 drop into both eyes 2 (two) times daily., Disp: 15 mL, Rfl: 3 .  Omega-3 Fatty Acids (FISH OIL) 1200 MG CAPS, Take 1,200 mg by mouth daily., Disp: , Rfl:  .  Tiotropium Bromide Monohydrate (SPIRIVA RESPIMAT) 1.25 MCG/ACT AERS, Inhale 2 puffs into the lungs daily., Disp: 4 g, Rfl: 5 .  traMADol (ULTRAM) 50 MG tablet, Take 1 tablet (50 mg total) by mouth every 6 (six) hours as needed for severe pain., Disp: 120 tablet, Rfl: 5 .  valACYclovir (VALTREX) 1000 MG tablet, Take 1,000 mg by mouth as needed. , Disp: , Rfl: 6  Assessment/ Plan: 63 y.o. female   1. Generalized anxiety disorder - ALPRAZolam (XANAX) 0.5 MG tablet; Take 1 tablet (0.5 mg total) by mouth 3 (three) times daily as needed.  Dispense: 90 tablet; Refill: 5  2. Pain of both hip joints - diclofenac sodium (VOLTAREN) 1 % GEL; Apply 4 g topically 4 (four) times daily.  Dispense: 400 g; Refill: 11 - Ambulatory referral to Orthopedic Surgery  3. Neuropathy - traMADol (ULTRAM) 50 MG tablet; Take 1 tablet (50 mg total) by mouth every 6 (six) hours as needed for severe pain.  Dispense: 120 tablet; Refill: 5  4. Chronic pain due to neoplasm - diclofenac sodium (VOLTAREN) 1 % GEL; Apply 4 g topically 4 (four) times daily.  Dispense: 400 g; Refill: 11 - traMADol (ULTRAM) 50 MG tablet; Take 1 tablet (50 mg total) by mouth every 6  (six) hours as needed for severe pain.  Dispense: 120 tablet; Refill: 5  5. Perennial allergic rhinitis with a probable nonallergic component Continue medications  6. Moderate persistent asthma, unspecified whether complicated Continue medications   Start time: 9:07 AM End time: 9:20 AM  Meds ordered this encounter  Medications  . DISCONTD: ALPRAZolam (XANAX) 0.5 MG tablet    Sig: Take 1 tablet (0.5 mg total) by mouth 3 (three) times daily as needed.    Dispense:  90 tablet    Refill:  0    This request is for a  new prescription for a controlled substance as required by Federal/State law.    Order Specific Question:   Supervising Provider    Answer:   Janora Norlander [[id number]]  . diclofenac sodium (VOLTAREN) 1 % GEL    Sig: Apply 4 g topically 4 (four) times daily.    Dispense:  400 g    Refill:  11    Order Specific Question:   Supervising Provider    Answer:   Janora Norlander [[id number]]  . traMADol (ULTRAM) 50 MG tablet    Sig: Take 1 tablet (50 mg total) by mouth every 6 (six) hours as needed for severe pain.    Dispense:  120 tablet    Refill:  5    Order Specific Question:   Supervising Provider    Answer:   Janora Norlander [[id number]]  . ALPRAZolam (XANAX) 0.5 MG tablet    Sig: Take 1 tablet (0.5 mg total) by mouth 3 (three) times daily as needed.    Dispense:  90 tablet    Refill:  5    This request is for a new prescription for a controlled substance as required by Federal/State law.    Order Specific Question:   Supervising Provider    Answer:   Janora Norlander [[id number]]    Particia Nearing PA-C [place] [phone]

## 2018-06-04 ENCOUNTER — Ambulatory Visit: Payer: PRIVATE HEALTH INSURANCE | Admitting: Allergy and Immunology

## 2018-06-09 ENCOUNTER — Ambulatory Visit: Payer: PRIVATE HEALTH INSURANCE | Admitting: Physician Assistant

## 2018-06-12 ENCOUNTER — Ambulatory Visit: Payer: Self-pay | Admitting: Orthopaedic Surgery

## 2018-06-19 ENCOUNTER — Other Ambulatory Visit: Payer: Self-pay

## 2018-06-19 ENCOUNTER — Encounter: Payer: Self-pay | Admitting: Orthopaedic Surgery

## 2018-06-19 ENCOUNTER — Other Ambulatory Visit: Payer: Self-pay | Admitting: Allergy and Immunology

## 2018-06-19 ENCOUNTER — Ambulatory Visit: Payer: BC Managed Care – PPO | Admitting: Orthopaedic Surgery

## 2018-06-19 ENCOUNTER — Ambulatory Visit: Payer: Self-pay

## 2018-06-19 VITALS — BP 144/89 | HR 109 | Ht 63.0 in | Wt 160.0 lb

## 2018-06-19 DIAGNOSIS — M5442 Lumbago with sciatica, left side: Secondary | ICD-10-CM | POA: Diagnosis not present

## 2018-06-19 DIAGNOSIS — G8929 Other chronic pain: Secondary | ICD-10-CM | POA: Diagnosis not present

## 2018-06-19 DIAGNOSIS — M25551 Pain in right hip: Secondary | ICD-10-CM | POA: Diagnosis not present

## 2018-06-19 DIAGNOSIS — M25552 Pain in left hip: Secondary | ICD-10-CM | POA: Diagnosis not present

## 2018-06-19 NOTE — Addendum Note (Signed)
Addended by: Lendon Collar on: 06/19/2018 11:36 AM   Modules accepted: Orders

## 2018-06-19 NOTE — Progress Notes (Signed)
Office Visit Note   Patient: Heidi Walls           Date of Birth: 10/29/1955           MRN: [id number] Visit Date: 06/19/2018              Requested by: Terald Sleeper, PA-C [address] [place],   [place] [address] PCP: Terald Sleeper, PA-C   Assessment & Plan: Visit Diagnoses:  1. Bilateral hip pain   2. Pain of both hip joints   3. Chronic left-sided low back pain with left-sided sciatica     Plan: I think the problem with her left buttock,and left leg originates from the lumbar spine and possibly L5-S1 where there are degenerative changes.  Hip joints look fine.  Discussed continued use of gabapentin and Voltaren gel.  We will set up a course of physical therapy at [facility] in [facility]. follow-up in 6 weeks if no improvement Follow-Up Instructions: No follow-ups on file.   Orders:  Orders Placed This Encounter  Procedures   XR Lumbar Spine 2-3 Views   XR Pelvis 1-2 Views   No orders of the defined types were placed in this encounter.     Procedures: No procedures performed   Clinical Data: No additional findings.   Subjective: Chief Complaint  Patient presents with   Left Hip - Pain   Right Hip - Pain  Patient presents today with bilateral hip pain X years. She said that the left is worse than the right, but both are progressively getting worse. Her pain is located in her buttock on both sides. Her pain is worse with any bending. She is unable to take any NSAIDS due to her kidneys. She has a history of cancer, and the chemo damaged her kidneys. She takes tramadol uses Voltaren.  Multiple joint complaints appear to be related to her chemo therapy medicines.  History of breast cancer.  Has some numbness and tingling in both of her feet as well related to the chemo.  Present problem can be quite severe and seemingly localized to her lumbar spine with referred pain to her left buttock left groin and oftentimes. has some history of swelling.  Takes  gabapentin for the burning in her feet and does see a podiatrist.  HPI  Review of Systems  Constitutional: Positive for fatigue.  HENT: Negative for ear pain.   Eyes: Negative for pain.  Respiratory: Negative for shortness of breath.   Cardiovascular: Positive for leg swelling.  Gastrointestinal: Negative for constipation and diarrhea.  Endocrine: Positive for heat intolerance. Negative for cold intolerance.  Genitourinary: Negative for difficulty urinating.  Musculoskeletal: Positive for joint swelling.  Skin: Negative for rash.  Allergic/Immunologic: Positive for immunocompromised state.  Neurological: Positive for weakness.  Hematological: Bruises/bleeds easily.  Psychiatric/Behavioral: Positive for sleep disturbance.     Objective: Vital Signs: BP (!) 144/89    Pulse (!) 109    Ht 5\' 3"  (1.6 m)    Wt 160 lb (72.6 kg)    BMI 28.34 kg/m   Physical Exam Constitutional:      Appearance: She is well-developed.  Eyes:     Pupils: Pupils are equal, round, and reactive to light.  Pulmonary:     Effort: Pulmonary effort is normal.  Skin:    General: Skin is warm and dry.  Neurological:     Mental Status: She is alert and oriented to person, place, and time.  Psychiatric:  Behavior: Behavior normal.     Ortho Exam awake alert and oriented x3.  Comfortable sitting.  Did not have any percussible tenderness of the lumbar spine.  Had some mild tenderness over the ischial tuberosity on the left.  No loss of motion with internal/external rotation of either hip.  Straight leg raise negative.  Does have altered sensibility in both of her feet related to her chemotherapy.  Specialty Comments:  No specialty comments available.  Imaging: No results found.   PMFS History: Patient Active Problem List   Diagnosis Date Noted   Bilateral hip pain 06/19/2018   Pain of both hip joints 06/19/2018   Chronic left-sided low back pain with left-sided sciatica 06/19/2018    Elevated cholesterol 01/15/2018   Arthritis of knee, left 07/02/2017   Arthritis of right knee 01/29/2017   Baker's cyst of knee, left 01/02/2017   Perennial allergic rhinitis with a probable nonallergic component 12/12/2016   Neuropathy 11/19/2016   Chronic pain 11/19/2016   Dysphonia 10/03/2016   Vocal cord atrophy 10/03/2016   Moderate persistent asthma 07/10/2016   Bronchitis 07/10/2016   Panic attack 10/23/2015   Anemia 10/22/2015   Chemotherapy induced diarrhea 10/17/2015   Osteoporosis 09/07/2015   Generalized anxiety disorder 09/07/2015   Osteoarthritis of right knee 09/07/2015   GERD (gastroesophageal reflux disease) 09/07/2015   Depression 09/07/2015   Acquired absence of left breast and nipple 06/30/2015   Breast cancer of upper-inner quadrant of left female breast ([place]) 05/26/2015   Past Medical History:  Diagnosis Date   Anemia    Anxiety    OCD   Arthritis    knees and hips   Asthma    Breast cancer ([place])    Breast cancer of upper-inner quadrant of left female breast ([place]) 05/26/2015   GERD (gastroesophageal reflux disease)    Hypertension    Insomnia     Family History  Problem Relation Age of Onset   Heart failure Mother    Diabetes Mother    Hypertension Mother    Hypertension Father    Breast cancer Maternal Grandmother    Allergic rhinitis Sister    Sinusitis Sister    Allergic rhinitis Daughter    Allergic rhinitis Son    Sinusitis Son    Bronchitis Grandchild    Asthma Neg Hx    Eczema Neg Hx    Urticaria Neg Hx    Immunodeficiency Neg Hx    Angioedema Neg Hx     Past Surgical History:  Procedure Laterality Date   BREAST RECONSTRUCTION WITH PLACEMENT OF TISSUE EXPANDER AND FLEX HD (ACELLULAR HYDRATED DERMIS) Left 06/24/2015   Procedure: LEFT BREAST RECONSTRUCTION WITH PLACEMENT OF TISSUE EXPANDER AND  ACELLULAR HYDRATED DERMIS ([place]);  Surgeon: Irene Limbo, MD;  Location: [place];  Service: Plastics;  Laterality: Left;   KNEE SURGERY Right 2000   arthroscopy   LIPOSUCTION WITH LIPOFILLING Left 12/23/2015   Procedure: LIPOFILLING TO LEFT CHEST FROM ABDOMEN  ;  Surgeon: Irene Limbo, MD;  Location: [place];  Service: Plastics;  Laterality: Left;   MASTECTOMY W/ SENTINEL NODE BIOPSY Left 06/24/2015   Procedure: LEFT TOTAL MASTECTOMY WITH SENTINEL LYMPH NODE BIOPSY;  Surgeon: Excell Seltzer, MD;  Location: [place];  Service: General;  Laterality: Left;   MASTOPEXY Right 12/23/2015   Procedure: RIGHT BREAST MASTOPEXY;  Surgeon: Irene Limbo, MD;  Location: [place];  Service: Plastics;  Laterality: Right;   MICROLARYNGOSCOPY W/VOCAL CORD INJECTION  N/A 10/15/2016   Procedure: SUSPENDED DIRECT MICROLARYNGOSCOPY WITH VOCAL CORD INJECTION WITH JET VENTILATION;  Surgeon: Melida Quitter, MD;  Location: [facility];  Service: ENT;  Laterality: N/A;   PORTACATH PLACEMENT Right 06/24/2015   Procedure: INSERTION PORT-A-CATH;  Surgeon: Excell Seltzer, MD;  Location: [facility];  Service: General;  Laterality: Right;   portacath removal  09/05/2016   REMOVAL OF BILATERAL TISSUE EXPANDERS WITH PLACEMENT OF BILATERAL BREAST IMPLANTS Left 12/23/2015   Procedure: REMOVAL OF LEFT  TISSUE EXPANDERS WITH PLACEMENT OF LEFT  BREAST IMPLANTS;  Surgeon: Irene Limbo, MD;  Location: [facility];  Service: Plastics;  Laterality: Left;   TONSILLECTOMY     Social History   Occupational History   Not on file  Tobacco Use   Smoking status: Former Smoker    Packs/day: 1.00    Years: 25.00    Pack years: 25.00    Types: Cigarettes    Quit date: 10/09/2005    Years since quitting: 12.7   Smokeless tobacco: Never Used  Substance and Sexual Activity   Alcohol use: No    Frequency: Never   Drug use: No   Sexual activity: Not on file

## 2018-06-24 ENCOUNTER — Ambulatory Visit: Payer: BC Managed Care – PPO | Attending: Orthopaedic Surgery | Admitting: Physical Therapy

## 2018-06-24 ENCOUNTER — Other Ambulatory Visit: Payer: Self-pay

## 2018-06-24 ENCOUNTER — Ambulatory Visit: Payer: Self-pay | Admitting: Orthopaedic Surgery

## 2018-06-24 ENCOUNTER — Encounter: Payer: Self-pay | Admitting: Physical Therapy

## 2018-06-24 DIAGNOSIS — M5442 Lumbago with sciatica, left side: Secondary | ICD-10-CM | POA: Diagnosis not present

## 2018-06-24 DIAGNOSIS — M25562 Pain in left knee: Secondary | ICD-10-CM | POA: Insufficient documentation

## 2018-06-24 DIAGNOSIS — M6281 Muscle weakness (generalized): Secondary | ICD-10-CM | POA: Insufficient documentation

## 2018-06-24 DIAGNOSIS — G8929 Other chronic pain: Secondary | ICD-10-CM | POA: Insufficient documentation

## 2018-06-24 DIAGNOSIS — M25561 Pain in right knee: Secondary | ICD-10-CM | POA: Diagnosis not present

## 2018-06-24 DIAGNOSIS — R262 Difficulty in walking, not elsewhere classified: Secondary | ICD-10-CM

## 2018-06-24 NOTE — Therapy (Signed)
[facility] [address]  [place], [place], [address] Phone: [phone]   Fax:  [phone]  Physical Therapy Evaluation  Patient Details  Name: Heidi Walls MRN: [id number] Date of Birth: 20-Aug-1955 Referring Provider (PT): Joni Fears, MD   Encounter Date: 06/24/2018  PT End of Session - 06/24/18 1522    Visit Number  1    Number of Visits  13    Date for PT Re-Evaluation  08/05/18    Authorization Type  [employer]; VL: 74    Authorization - Visit Number  1    Authorization - Number of Visits  30    PT Start Time  2376    PT Stop Time  1354    PT Time Calculation (min)  51 min    Activity Tolerance  Patient tolerated treatment well;Patient limited by pain    Behavior During Therapy  [facility] for tasks assessed/performed       Past Medical History:  Diagnosis Date  . Anemia   . Anxiety    OCD  . Arthritis    knees and hips  . Asthma   . Breast cancer ([place])   . Breast cancer of upper-inner quadrant of left female breast ([place]) 05/26/2015  . GERD (gastroesophageal reflux disease)   . Hypertension   . Insomnia     Past Surgical History:  Procedure Laterality Date  . BREAST RECONSTRUCTION WITH PLACEMENT OF TISSUE EXPANDER AND FLEX HD (ACELLULAR HYDRATED DERMIS) Left 06/24/2015   Procedure: LEFT BREAST RECONSTRUCTION WITH PLACEMENT OF TISSUE EXPANDER AND  ACELLULAR HYDRATED DERMIS ([place]);  Surgeon: Irene Limbo, MD;  Location: [facility];  Service: Plastics;  Laterality: Left;  . KNEE SURGERY Right 2000   arthroscopy  . LIPOSUCTION WITH LIPOFILLING Left 12/23/2015   Procedure: LIPOFILLING TO LEFT CHEST FROM ABDOMEN  ;  Surgeon: Irene Limbo, MD;  Location: [facility];  Service: Plastics;  Laterality: Left;  Marland Kitchen MASTECTOMY W/ SENTINEL NODE BIOPSY Left 06/24/2015   Procedure: LEFT TOTAL MASTECTOMY WITH SENTINEL LYMPH NODE BIOPSY;  Surgeon: Excell Seltzer, MD;  Location:  [place];  Service: General;  Laterality: Left;  Marland Kitchen MASTOPEXY Right 12/23/2015   Procedure: RIGHT BREAST MASTOPEXY;  Surgeon: Irene Limbo, MD;  Location: [place] [facility];  Service: Plastics;  Laterality: Right;  . MICROLARYNGOSCOPY W/VOCAL CORD INJECTION N/A 10/15/2016   Procedure: SUSPENDED DIRECT MICROLARYNGOSCOPY WITH VOCAL CORD INJECTION WITH JET VENTILATION;  Surgeon: Melida Quitter, MD;  Location: [facility] [place];  Service: ENT;  Laterality: N/A;  . PORTACATH PLACEMENT Right 06/24/2015   Procedure: INSERTION PORT-A-CATH;  Surgeon: Excell Seltzer, MD;  Location: [place];  Service: General;  Laterality: Right;  . portacath removal  09/05/2016  . REMOVAL OF BILATERAL TISSUE EXPANDERS WITH PLACEMENT OF BILATERAL BREAST IMPLANTS Left 12/23/2015   Procedure: REMOVAL OF LEFT  TISSUE EXPANDERS WITH PLACEMENT OF LEFT  BREAST IMPLANTS;  Surgeon: Irene Limbo, MD;  Location: [facility];  Service: Plastics;  Laterality: Left;  . TONSILLECTOMY      There were no vitals filed for this visit.   Subjective Assessment - 06/24/18 1306    Subjective  Patient reports LBP of atleast 1 year duration, which has gotten progressively worse. Pain is located along B LB with radiation of pain to B buttocks, intermittently down to L posterior mid-calf. Also reports "painful skin" and neuropathy in L foot since chemo and numbness in R foot after  a chronic injury.  LBP worse with getting up from chair, bending forward, stairs. B knees hurt with steps, walking, rainy weather. Has fallen on the stairs and has trouble getting into a tub shower d/t neuropathy.    Pertinent History  HTN, osteoporosis, GERD, hx breast CA with B mastectomy 2017, asthma, anxiety, anemia, R knee arthroscopy 2000, L arm lymphedema    Limitations  Sitting;Lifting;Standing;Walking;House hold activities    How long can you sit comfortably?  30 min on cushion    How long can you stand  comfortably?  15-30 min    How long can you walk comfortably?  1/2 mile    Diagnostic tests  06/19/18 lumbar xray: There is some narrowing of the L5-S1 disc space.  There are anterior osteophytes at L3-L4 and to a lesser extent canal appears to be patent.  Minimal sclerosis about the SI joints. Some facet sclerosis at L4-5 and particularly L5-S1. 06/19/18 pelvis xray:The hip joints appear to be well-maintained very minimal subchondral cysts more on the right than the left hip but quite subtle.    Patient Stated Goals  get rid of pain    Currently in Pain?  Yes    Pain Location  Back    Pain Orientation  Right;Left;Lower    Pain Type  Chronic pain    Multiple Pain Sites  Yes    Pain Score  0    Pain Location  Knee    Pain Orientation  Right;Medial;Posterior    Pain Descriptors / Indicators  Aching    Pain Type  Chronic pain    Pain Score  0    Pain Location  Knee    Pain Orientation  Left;Medial;Lateral    Pain Descriptors / Indicators  Aching    Pain Type  Chronic pain         [facility] PT Assessment - 06/24/18 1327      Assessment   Medical Diagnosis  Chronic L sided LBP with L sciatica    Referring Provider (PT)  Joni Fears, MD    Onset Date/Surgical Date  06/23/17    Next MD Visit  not scheduled    Prior Therapy  yes      Precautions   Precautions  Fall   neuropathy, L arm lymphedema, osteoporosis, hx CA, asthma     Restrictions   Weight Bearing Restrictions  No      Balance Screen   Has the patient fallen in the past 6 months  Yes    How many times?  3    Has the patient had a decrease in activity level because of a fear of falling?   Yes    Is the patient reluctant to leave their home because of a fear of falling?   No      Home Environment   Living Environment  Private residence    Living Arrangements  Spouse/significant other;Non-relatives/Friends    Available Help at Discharge  Family    Type of [place] to enter    Entrance  Stairs-Number of Steps  3    Entrance Stairs-Rails  None    Home Layout  Two level    Alternate Level Stairs-Number of Steps  9    Alternate Level Stairs-Rails  Right      Prior Function   Level of Independence  Independent    Vocation  On disability    Leisure  walking  Cognition   Overall Cognitive Status  Within Functional Limits for tasks assessed      Observation/Other Assessments   Observations  L knee moderately edematous to region of pes anserine and lateral patellar facet; R knee edematous in area of pes anserine    Focus on Therapeutic Outcomes (FOTO)   Lumbar: 39 (61% limited, 48% predicted)      Sensation   Light Touch  Impaired by gross assessment   neuropathy in L foot, decreased sensation in R     Coordination   Gross Motor Movements are Fluid and Coordinated  Yes      Posture/Postural Control   Posture/Postural Control  Postural limitations    Postural Limitations  Rounded Shoulders;Forward head      ROM / Strength   AROM / PROM / Strength  AROM;Strength      AROM   AROM Assessment Site  Lumbar;Knee    Right/Left Knee  Right;Left    Right Knee Extension  --   next session   Right Knee Flexion  --   next session   Left Knee Extension  --   next session   Left Knee Flexion  --   next session   Lumbar Flexion  distal shin   moderate pain   Lumbar Extension  mildly limited   severe pain   Lumbar - Right Side Bend  distal thigh   moderate pain   Lumbar - Left Side Bend  distal thigh   moderate pain   Lumbar - Right Rotation  moderately limited   moderate pain   Lumbar - Left Rotation  moderately limited   moderate pain     Strength   Strength Assessment Site  Hip;Knee;Ankle    Right/Left Hip  Right;Left    Right Hip Flexion  4/5    Right Hip ABduction  4/5    Right Hip ADduction  4/5    Left Hip Extension  4-/5    Left Hip ABduction  4/5    Left Hip ADduction  4/5    Right/Left Knee  Right;Left    Right Knee Flexion  4/5   medial and  posterior knee pain   Right Knee Extension  4/5   medial and posterior knee pain   Left Knee Flexion  4/5   anterior knee pain   Left Knee Extension  4/5   anterior knee pain   Right/Left Ankle  Right;Left    Right Ankle Dorsiflexion  5/5   pain   Left Ankle Dorsiflexion  4+/5   pain     Flexibility   Soft Tissue Assessment /Muscle Length  yes    Piriformis  severely tight B      Palpation   Palpation comment  Palpable L Baker's cyst TTP; B knees TTP in area of pes anserine; L knee also TTP laterally and superior to patella; increased tone along R lumbar paraspinals, B superior glute, medial glute, and piriformis very TTP      Ambulation/Gait   Assistive device  None    Gait Pattern  Step-through pattern   slight hip drop on B sides   Ambulation Surface  Level;Indoor    Gait velocity  slightly decreased                Objective measurements completed on examination: See above findings.              PT Education - 06/24/18 1358    Education Details  prognosis, POC, HEP  Person(s) Educated  Patient    Methods  Explanation;Demonstration;Tactile cues;Verbal cues;Handout    Comprehension  Verbalized understanding;Returned demonstration       PT Short Term Goals - 06/24/18 1534      PT SHORT TERM GOAL #1   Title  Patient to be independent with initial HEP.    Time  3    Period  Weeks    Status  New    Target Date  07/15/18        PT Long Term Goals - 06/24/18 1535      PT LONG TERM GOAL #1   Title  Patient to be independent with advanced HEP.    Time  6    Period  Weeks    Status  New    Target Date  08/05/18      PT LONG TERM GOAL #2   Title  Patient to demonstrate B LE strength >=4+/5.    Time  6    Period  Weeks    Status  New    Target Date  08/05/18      PT LONG TERM GOAL #3   Title  Patient to demonstrate [facility] and pain-free lumbar AROM.    Time  6    Period  Weeks    Status  New    Target Date  08/05/18      PT LONG TERM  GOAL #4   Title  Patient to demonstrate B knee AROM WFL and without pain limiting.    Time  6    Period  Weeks    Status  New    Target Date  08/05/18      PT LONG TERM GOAL #5   Title  Patient to report 0/10 pain in average with STS.    Time  6    Period  Weeks    Status  New    Target Date  08/05/18      Additional Long Term Goals   Additional Long Term Goals  Yes      PT LONG TERM GOAL #6   Title  Patient to report 1 mile of walking before onset of pain.    Time  6    Period  Weeks    Status  New    Target Date  08/05/18             Plan - 06/24/18 1523    Clinical Impression Statement  atient is a 63y/o F, PMH significant for breast CA, presenting to OPPT with c/o chronic B LBP and B knee pain. LBP radiates to B buttocks and intermittently down to L posterior mid-calf. Endorses B foot paresthesias- patient also mentions hx of neuropathy s/p chemo. Admits to a couple falls this year d/t imbalance. LBP worse with getting up from chair, bending forward, stairs. B knee pain worse with steps, walking, rainy weather. Patient today with marked but symmetrical weakness in B LEs, edema in B knees, limited and painful lumbar AROM, decreased piriformis flexibility, and TTP over B knees, R LB, and B glutes. Educated patient on gentle stretching HEP and instructed on self-STM ball massage to B glutes for pain relief. Patient reported understanding. Would benefit from skilled PT services 2x/week for 6 weeks to address aforementioned impairments.    Personal Factors and Comorbidities  Age;Comorbidity 3+;Time since onset of injury/illness/exacerbation;Fitness;Past/Current Experience    Comorbidities  HTN, osteoporosis, GERD, hx breast CA with B mastectomy 2017, asthma, anxiety, anemia, R knee arthroscopy 2000, L  arm lymphedema    Examination-Activity Limitations  Bathing;Sit;Sleep;Bend;Squat;Caring for Others;Stairs;Carry;Stand;Toileting;Dressing;Transfers;Hygiene/Grooming;Lift;Locomotion  Level;Reach Overhead    Examination-Participation Restrictions  Church;Cleaning;Shop;Community Activity;Driving;Yard Work;Interpersonal Relationship;Laundry;Meal Prep    Stability/Clinical Decision Making  Evolving/Moderate complexity    Clinical Decision Making  Moderate    Rehab Potential  Good    PT Frequency  2x / week    PT Duration  6 weeks    PT Treatment/Interventions  ADLs/Self Care Home Management;Cryotherapy;Electrical Stimulation;Iontophoresis 4mg /ml Dexamethasone;Moist Heat;Ultrasound;Gait training;Stair training;Functional mobility training;Therapeutic activities;Therapeutic exercise;Balance training;Manual techniques;Orthotic Fit/Training;Patient/family education;Neuromuscular re-education;Passive range of motion;Dry needling;Energy conservation;Taping;Vasopneumatic Device    PT Next Visit Plan  reassess HEP, assess B knee AROM and patellar mobility    Consulted and Agree with Plan of Care  Patient       Patient will benefit from skilled therapeutic intervention in order to improve the following deficits and impairments:  Hypomobility, Increased edema, Decreased activity tolerance, Decreased strength, Pain, Difficulty walking, Decreased mobility, Decreased balance, Decreased range of motion, Improper body mechanics, Postural dysfunction, Impaired flexibility  Visit Diagnosis: 1. Chronic bilateral low back pain with left-sided sciatica   2. Chronic pain of right knee   3. Chronic pain of left knee   4. Muscle weakness (generalized)   5. Difficulty in walking, not elsewhere classified        Problem List Patient Active Problem List   Diagnosis Date Noted  . Bilateral hip pain 06/19/2018  . Pain of both hip joints 06/19/2018  . Chronic left-sided low back pain with left-sided sciatica 06/19/2018  . Elevated cholesterol 01/15/2018  . Arthritis of knee, left 07/02/2017  . Arthritis of right knee 01/29/2017  . Baker's cyst of knee, left 01/02/2017  . Perennial allergic  rhinitis with a probable nonallergic component 12/12/2016  . Neuropathy 11/19/2016  . Chronic pain 11/19/2016  . Dysphonia 10/03/2016  . Vocal cord atrophy 10/03/2016  . Moderate persistent asthma 07/10/2016  . Bronchitis 07/10/2016  . Panic attack 10/23/2015  . Anemia 10/22/2015  . Chemotherapy induced diarrhea 10/17/2015  . Osteoporosis 09/07/2015  . Generalized anxiety disorder 09/07/2015  . Osteoarthritis of right knee 09/07/2015  . GERD (gastroesophageal reflux disease) 09/07/2015  . Depression 09/07/2015  . Acquired absence of left breast and nipple 06/30/2015  . Breast cancer of upper-inner quadrant of left female breast ([place]) 05/26/2015    Janene Harvey, PT, DPT 06/24/18 3:51 PM   [facility] [address]  [place], [place], [address] Phone: [phone]   Fax:  [phone]  Name: BELLAMIA FERCH MRN: [id number] Date of Birth: 12/18/1955

## 2018-06-27 DIAGNOSIS — H40033 Anatomical narrow angle, bilateral: Secondary | ICD-10-CM | POA: Diagnosis not present

## 2018-07-01 ENCOUNTER — Ambulatory Visit: Payer: BC Managed Care – PPO | Admitting: Physical Therapy

## 2018-07-01 ENCOUNTER — Encounter: Payer: Self-pay | Admitting: Physical Therapy

## 2018-07-01 ENCOUNTER — Other Ambulatory Visit: Payer: Self-pay

## 2018-07-01 DIAGNOSIS — M25562 Pain in left knee: Secondary | ICD-10-CM | POA: Diagnosis not present

## 2018-07-01 DIAGNOSIS — M25561 Pain in right knee: Secondary | ICD-10-CM

## 2018-07-01 DIAGNOSIS — M6281 Muscle weakness (generalized): Secondary | ICD-10-CM

## 2018-07-01 DIAGNOSIS — G8929 Other chronic pain: Secondary | ICD-10-CM | POA: Diagnosis not present

## 2018-07-01 DIAGNOSIS — R262 Difficulty in walking, not elsewhere classified: Secondary | ICD-10-CM

## 2018-07-01 DIAGNOSIS — M5442 Lumbago with sciatica, left side: Secondary | ICD-10-CM | POA: Diagnosis not present

## 2018-07-01 NOTE — Therapy (Signed)
[facility] [address]  [place], [place], [address] Phone: [phone]   Fax:  [phone]  Physical Therapy Treatment  Patient Details  Name: Heidi Walls MRN: [id number] Date of Birth: 30-Aug-1955 Referring Provider (PT): Joni Fears, MD   Encounter Date: 07/01/2018  PT End of Session - 07/01/18 1158    Visit Number  2    Number of Visits  13    Date for PT Re-Evaluation  08/05/18    Authorization Type  BCBS; VL: 3    Authorization - Visit Number  2    Authorization - Number of Visits  30    Activity Tolerance  Patient tolerated treatment well    Behavior During Therapy  [facility] for tasks assessed/performed       Past Medical History:  Diagnosis Date  . Anemia   . Anxiety    OCD  . Arthritis    knees and hips  . Asthma   . Breast cancer ([place])   . Breast cancer of upper-inner quadrant of left female breast ([place]) 05/26/2015  . GERD (gastroesophageal reflux disease)   . Hypertension   . Insomnia     Past Surgical History:  Procedure Laterality Date  . BREAST RECONSTRUCTION WITH PLACEMENT OF TISSUE EXPANDER AND FLEX HD (ACELLULAR HYDRATED DERMIS) Left 06/24/2015   Procedure: LEFT BREAST RECONSTRUCTION WITH PLACEMENT OF TISSUE EXPANDER AND  ACELLULAR HYDRATED DERMIS (CORTIVA);  Surgeon: Irene Limbo, MD;  Location: [facility];  Service: Plastics;  Laterality: Left;  . KNEE SURGERY Right 2000   arthroscopy  . LIPOSUCTION WITH LIPOFILLING Left 12/23/2015   Procedure: LIPOFILLING TO LEFT CHEST FROM ABDOMEN  ;  Surgeon: Irene Limbo, MD;  Location: [facility];  Service: Plastics;  Laterality: Left;  Marland Kitchen MASTECTOMY W/ SENTINEL NODE BIOPSY Left 06/24/2015   Procedure: LEFT TOTAL MASTECTOMY WITH SENTINEL LYMPH NODE BIOPSY;  Surgeon: Excell Seltzer, MD;  Location: [facility];  Service: General;  Laterality: Left;  Marland Kitchen MASTOPEXY Right 12/23/2015   Procedure:  RIGHT BREAST MASTOPEXY;  Surgeon: Irene Limbo, MD;  Location: [facility];  Service: Plastics;  Laterality: Right;  . MICROLARYNGOSCOPY W/VOCAL CORD INJECTION N/A 10/15/2016   Procedure: SUSPENDED DIRECT MICROLARYNGOSCOPY WITH VOCAL CORD INJECTION WITH JET VENTILATION;  Surgeon: Melida Quitter, MD;  Location: [facility];  Service: ENT;  Laterality: N/A;  . PORTACATH PLACEMENT Right 06/24/2015   Procedure: INSERTION PORT-A-CATH;  Surgeon: Excell Seltzer, MD;  Location: [facility];  Service: General;  Laterality: Right;  . portacath removal  09/05/2016  . REMOVAL OF BILATERAL TISSUE EXPANDERS WITH PLACEMENT OF BILATERAL BREAST IMPLANTS Left 12/23/2015   Procedure: REMOVAL OF LEFT  TISSUE EXPANDERS WITH PLACEMENT OF LEFT  BREAST IMPLANTS;  Surgeon: Irene Limbo, MD;  Location: [facility];  Service: Plastics;  Laterality: Left;  . TONSILLECTOMY      There were no vitals filed for this visit.  Subjective Assessment - 07/01/18 1102    Subjective  Wearing small boot on R foot d/t hx of stress fx in R foot. Walked 2 miles today. Reports MD advised her to wear the boot when she walks if she is in pain. Reports she tried self-STM with a dryer ball as she doesnt have a tennis ball.    Pertinent History  HTN, osteoporosis, GERD, hx breast CA with B mastectomy 2017, asthma, anxiety, anemia, R knee arthroscopy 2000, L arm lymphedema    Diagnostic  tests  06/19/18 lumbar xray: There is some narrowing of the L5-S1 disc space.  There are anterior osteophytes at L3-L4 and to a lesser extent canal appears to be patent.  Minimal sclerosis about the SI joints. Some facet sclerosis at L4-5 and particularly L5-S1. 06/19/18 pelvis xray:The hip joints appear to be well-maintained very minimal subchondral cysts more on the right than the left hip but quite subtle.    Patient Stated Goals  get rid of pain    Currently in Pain?  Yes    Pain Score  3     Pain Location  Back     Pain Orientation  Right;Left;Lower    Pain Descriptors / Indicators  Aching    Pain Type  Chronic pain         [facility] PT Assessment - 07/01/18 0001      AROM   Right Knee Extension  3    Right Knee Flexion  100   pain   Left Knee Extension  2    Left Knee Flexion  87   pain     Palpation   Patella mobility  B patellae hypomobile and painful superior/inferior glide, R hypomobilie M/L glide                   [facility] Adult PT Treatment/Exercise - 07/01/18 0001      Exercises   Exercises  Lumbar;Knee/Hip      Lumbar Exercises: Stretches   Hip Flexor Stretch  Right;Left;1 rep;30 seconds    Hip Flexor Stretch Limitations  mod thomas with strap      Lumbar Exercises: Aerobic   Nustep  L1 x 6 min (LEs only)      Knee/Hip Exercises: Stretches   Piriformis Stretch  Right;Left;1 rep;30 seconds    Piriformis Stretch Limitations  supie KTOS   cues to avoid pushing into pain   Other Knee/Hip Stretches  figure 4 stretch B 30"    cues to avoid pushing into pain     Knee/Hip Exercises: Supine   Quad Sets  Strengthening;Right;Left;1 set;5 reps    Quad Sets Limitations  5x10" with towel roll each side    Bridges  Strengthening;Both;1 set;10 reps    Bridges Limitations  "tolerable"    Straight Leg Raises  Strengthening;Right;Left;1 set;10 reps    Straight Leg Raises Limitations  good TKE & no quad lag    Other Supine Knee/Hip Exercises  LTR x20 to tolerance      Manual Therapy   Manual Therapy  Soft tissue mobilization;Myofascial release    Manual therapy comments  prone    Soft tissue mobilization  STM to R & L piriformis, superior glute, glute med    Myofascial Release  manual TPR to B piriformis, R superior and lateral glute- very TTP and with palpable soft tissue restriction              PT Education - 07/01/18 1158    Education Details  update to HEP    Person(s) Educated  Patient    Methods  Explanation;Demonstration;Tactile cues;Verbal cues;Handout     Comprehension  Verbalized understanding;Returned demonstration       PT Short Term Goals - 07/01/18 1204      PT SHORT TERM GOAL #1   Title  Patient to be independent with initial HEP.    Time  3    Period  Weeks    Status  On-going    Target Date  07/15/18  PT Long Term Goals - 07/01/18 1204      PT LONG TERM GOAL #1   Title  Patient to be independent with advanced HEP.    Time  6    Period  Weeks    Status  On-going      PT LONG TERM GOAL #2   Title  Patient to demonstrate B LE strength >=4+/5.    Time  6    Period  Weeks    Status  On-going      PT LONG TERM GOAL #3   Title  Patient to demonstrate [facility] and pain-free lumbar AROM.    Time  6    Period  Weeks    Status  On-going      PT LONG TERM GOAL #4   Title  Patient to demonstrate B knee AROM WFL and without pain limiting.    Time  6    Period  Weeks    Status  On-going      PT LONG TERM GOAL #5   Title  Patient to report 0/10 pain in average with STS.    Time  6    Period  Weeks    Status  On-going      PT LONG TERM GOAL #6   Title  Patient to report 1 mile of walking before onset of pain.    Time  6    Period  Weeks    Status  On-going            Plan - 07/01/18 1159    Clinical Impression Statement  Patient arrived to session with small walking boot worn on R foot- reporting that she was advised to wear this if she has pain in her foot d/t current stress fx. Assessed B knee AROM- patient measured 2-87 degrees on L, 3-100 degrees on R LE with pain at end ranges of flexion on B sides. Introduced gentle quad stretching- patient demonstrating good TKE and quad activation with quad sets and SLR, however reporting discomfort in infrapatellar region. Spoke with patient about KT tape for pain relief- will trial this next session. Worked on piriformis stretching with reminders required to avoid pushing into pain. Able to tolerate all other ther-ex without complaints. Ended session with STM and manual  TPR to B piriformis and superior/latera glutes. Patient with considerable tenderness and soft tissue restriction in these areas. Reports mild relief at end of session. Updated HEP with exercises that were well-tolerated today. Patient reported understanding.    Comorbidities  HTN, osteoporosis, GERD, hx breast CA with B mastectomy 2017, asthma, anxiety, anemia, R knee arthroscopy 2000, L arm lymphedema    PT Treatment/Interventions  ADLs/Self Care Home Management;Cryotherapy;Electrical Stimulation;Iontophoresis 4mg /ml Dexamethasone;Moist Heat;Ultrasound;Gait training;Stair training;Functional mobility training;Therapeutic activities;Therapeutic exercise;Balance training;Manual techniques;Orthotic Fit/Training;Patient/family education;Neuromuscular re-education;Passive range of motion;Dry needling;Energy conservation;Taping;Vasopneumatic Device    PT Next Visit Plan  KT tape to B knees, quad & HS stretching, STM to B buttock, hip and core strengthening    Consulted and Agree with Plan of Care  Patient       Patient will benefit from skilled therapeutic intervention in order to improve the following deficits and impairments:  Hypomobility, Increased edema, Decreased activity tolerance, Decreased strength, Pain, Difficulty walking, Decreased mobility, Decreased balance, Decreased range of motion, Improper body mechanics, Postural dysfunction, Impaired flexibility  Visit Diagnosis: 1. Chronic bilateral low back pain with left-sided sciatica   2. Chronic pain of right knee   3. Chronic pain of left knee  4. Muscle weakness (generalized)   5. Difficulty in walking, not elsewhere classified        Problem List Patient Active Problem List   Diagnosis Date Noted  . Bilateral hip pain 06/19/2018  . Pain of both hip joints 06/19/2018  . Chronic left-sided low back pain with left-sided sciatica 06/19/2018  . Elevated cholesterol 01/15/2018  . Arthritis of knee, left 07/02/2017  . Arthritis of right  knee 01/29/2017  . Baker's cyst of knee, left 01/02/2017  . Perennial allergic rhinitis with a probable nonallergic component 12/12/2016  . Neuropathy 11/19/2016  . Chronic pain 11/19/2016  . Dysphonia 10/03/2016  . Vocal cord atrophy 10/03/2016  . Moderate persistent asthma 07/10/2016  . Bronchitis 07/10/2016  . Panic attack 10/23/2015  . Anemia 10/22/2015  . Chemotherapy induced diarrhea 10/17/2015  . Osteoporosis 09/07/2015  . Generalized anxiety disorder 09/07/2015  . Osteoarthritis of right knee 09/07/2015  . GERD (gastroesophageal reflux disease) 09/07/2015  . Depression 09/07/2015  . Acquired absence of left breast and nipple 06/30/2015  . Breast cancer of upper-inner quadrant of left female breast ([place]) 05/26/2015     Janene Harvey, PT, DPT 07/01/18 12:08 PM   [facility] [address]  [place], [place], [address] Phone: [phone]   Fax:  [phone]  Name: Heidi Walls MRN: [id number] Date of Birth: 1955/04/26

## 2018-07-02 NOTE — Assessment & Plan Note (Addendum)
Left simple mastectomy with reconstruction 06/24/2015: IDC grade 3, 2.5 cm, with high-grade DCIS, ALH, LVI present, margins negative, 0/1 sentinel node negative, , ER 100%, PR 5%, HER-2 positive ratio 1.56 with average copy #8.25, Ki-67 20%  Pathologic stage:T2 N0 stage II a Treatment plan: 1. Adjuvant chemotherapy with TCHP X 6 completed 10/30/17followed by Herceptin maintenance for 1 year which completed 08/07/2016 2. Followed by adjuvant hormonal therapy with anastrozole started 11/28/2015 ---------------------------------------------------------------------------------------------------------- Current treatment:  Echocardiogram 05/22/16 LVEF 60-65% Anastrozole started 11/28/15  Anastrozole Toxicities:  1.  Severe hot flashes 2. muscle stiffness and achiness  Memory loss  Bone density test 04/12/2016: T score -2.2. Osteopenia. She is taking Fosamax and calcium and vitamin D. Breast cancer surveillance: Right breast mammogram 06/12/2017: Benign at [facility] breast density category C  Anxiety: On antianxiety medications  Return to clinic in 1 year for follow-up

## 2018-07-04 ENCOUNTER — Encounter: Payer: Self-pay | Admitting: Physical Therapy

## 2018-07-04 ENCOUNTER — Other Ambulatory Visit: Payer: Self-pay

## 2018-07-04 ENCOUNTER — Ambulatory Visit: Payer: BC Managed Care – PPO | Admitting: Physical Therapy

## 2018-07-04 DIAGNOSIS — M6281 Muscle weakness (generalized): Secondary | ICD-10-CM

## 2018-07-04 DIAGNOSIS — G8929 Other chronic pain: Secondary | ICD-10-CM | POA: Diagnosis not present

## 2018-07-04 DIAGNOSIS — M5442 Lumbago with sciatica, left side: Secondary | ICD-10-CM | POA: Diagnosis not present

## 2018-07-04 DIAGNOSIS — R262 Difficulty in walking, not elsewhere classified: Secondary | ICD-10-CM

## 2018-07-04 DIAGNOSIS — M25561 Pain in right knee: Secondary | ICD-10-CM | POA: Diagnosis not present

## 2018-07-04 DIAGNOSIS — M25562 Pain in left knee: Secondary | ICD-10-CM | POA: Diagnosis not present

## 2018-07-04 NOTE — Therapy (Signed)
[facility] [address]  [place], [place], [address] Phone: [phone]   Fax:  [phone]  Physical Therapy Treatment  Patient Details  Name: Heidi Walls MRN: [id number] Date of Birth: October 12, 1955 Referring Provider (PT): Joni Fears, MD   Encounter Date: 07/04/2018  PT End of Session - 07/04/18 1044    Visit Number  3    Number of Visits  13    Date for PT Re-Evaluation  08/05/18    Authorization Type  BCBS; VL: 44    Authorization - Visit Number  3    Authorization - Number of Visits  30    PT Start Time  1000    PT Stop Time  9935    PT Time Calculation (min)  43 min    Activity Tolerance  Patient tolerated treatment well    Behavior During Therapy  [facility] for tasks assessed/performed       Past Medical History:  Diagnosis Date  . Anemia   . Anxiety    OCD  . Arthritis    knees and hips  . Asthma   . Breast cancer ([place])   . Breast cancer of upper-inner quadrant of left female breast ([place]) 05/26/2015  . GERD (gastroesophageal reflux disease)   . Hypertension   . Insomnia     Past Surgical History:  Procedure Laterality Date  . BREAST RECONSTRUCTION WITH PLACEMENT OF TISSUE EXPANDER AND FLEX HD (ACELLULAR HYDRATED DERMIS) Left 06/24/2015   Procedure: LEFT BREAST RECONSTRUCTION WITH PLACEMENT OF TISSUE EXPANDER AND  ACELLULAR HYDRATED DERMIS (CORTIVA);  Surgeon: Irene Limbo, MD;  Location: [facility];  Service: Plastics;  Laterality: Left;  . KNEE SURGERY Right 2000   arthroscopy  . LIPOSUCTION WITH LIPOFILLING Left 12/23/2015   Procedure: LIPOFILLING TO LEFT CHEST FROM ABDOMEN  ;  Surgeon: Irene Limbo, MD;  Location: [facility];  Service: Plastics;  Laterality: Left;  Marland Kitchen MASTECTOMY W/ SENTINEL NODE BIOPSY Left 06/24/2015   Procedure: LEFT TOTAL MASTECTOMY WITH SENTINEL LYMPH NODE BIOPSY;  Surgeon: Excell Seltzer, MD;  Location: [facility];   Service: General;  Laterality: Left;  Marland Kitchen MASTOPEXY Right 12/23/2015   Procedure: RIGHT BREAST MASTOPEXY;  Surgeon: Irene Limbo, MD;  Location: [facility];  Service: Plastics;  Laterality: Right;  . MICROLARYNGOSCOPY W/VOCAL CORD INJECTION N/A 10/15/2016   Procedure: SUSPENDED DIRECT MICROLARYNGOSCOPY WITH VOCAL CORD INJECTION WITH JET VENTILATION;  Surgeon: Melida Quitter, MD;  Location: [facility];  Service: ENT;  Laterality: N/A;  . PORTACATH PLACEMENT Right 06/24/2015   Procedure: INSERTION PORT-A-CATH;  Surgeon: Excell Seltzer, MD;  Location: [facility];  Service: General;  Laterality: Right;  . portacath removal  09/05/2016  . REMOVAL OF BILATERAL TISSUE EXPANDERS WITH PLACEMENT OF BILATERAL BREAST IMPLANTS Left 12/23/2015   Procedure: REMOVAL OF LEFT  TISSUE EXPANDERS WITH PLACEMENT OF LEFT  BREAST IMPLANTS;  Surgeon: Irene Limbo, MD;  Location: [facility];  Service: Plastics;  Laterality: Left;  . TONSILLECTOMY      There were no vitals filed for this visit.  Subjective Assessment - 07/04/18 0958    Subjective  Reports that her "butt bones" are feeling better. Her husband is helping her with the HEP.    Pertinent History  HTN, osteoporosis, GERD, hx breast CA with B mastectomy 2017, asthma, anxiety, anemia, R knee arthroscopy 2000, L arm lymphedema    Diagnostic tests  06/19/18 lumbar xray: There is  some narrowing of the L5-S1 disc space.  There are anterior osteophytes at L3-L4 and to a lesser extent canal appears to be patent.  Minimal sclerosis about the SI joints. Some facet sclerosis at L4-5 and particularly L5-S1. 06/19/18 pelvis xray:The hip joints appear to be well-maintained very minimal subchondral cysts more on the right than the left hip but quite subtle.    Patient Stated Goals  get rid of pain    Currently in Pain?  Yes    Pain Score  2     Pain Location  Back    Pain Orientation  Left    Pain Descriptors / Indicators   Aching    Pain Type  Chronic pain    Pain Score  3    Pain Location  Knee    Pain Orientation  Left;Right;Anterior    Pain Descriptors / Indicators  Aching    Pain Type  Chronic pain                       [facility] Adult PT Treatment/Exercise - 07/04/18 0001      Lumbar Exercises: Aerobic   Nustep  L3 x 6 min (LEs only)      Lumbar Exercises: Supine   Pelvic Tilt  10 reps    Pelvic Tilt Limitations  anterior/posterior pelvic tilts     Other Supine Lumbar Exercises  overhead yellow medball lift x10   good maintenance of posterior pelvic tilt   Other Supine Lumbar Exercises  LTR x20 to tolerance      Knee/Hip Exercises: Stretches   Piriformis Stretch  Right;Left;1 rep;30 seconds    Piriformis Stretch Limitations  supine KTOS   cues to avoid pushing into pain   Other Knee/Hip Stretches  figure 4 stretch B 30"    better tolerance     Knee/Hip Exercises: Standing   Terminal Knee Extension  Strengthening;Right;Left;1 set;10 reps;Theraband    Theraband Level (Terminal Knee Extension)  Level 4 (Blue)    Terminal Knee Extension Limitations  TKE at chair 10x3" each LE    Functional Squat  2 sets;10 reps    Functional Squat Limitations  mini squat at treadmill rail   cues for knee alignment; mild medial R knee pain   Other Standing Knee Exercises  sidestepping with yellow TB around toes 2x90ft      Knee/Hip Exercises: Sidelying   Clams  2x10 each side   manual cues to maintain hips forward            PT Education - 07/04/18 1044    Education Details  update to HEP    Person(s) Educated  Patient    Methods  Explanation;Demonstration;Tactile cues;Verbal cues;Handout    Comprehension  Verbalized understanding;Returned demonstration       PT Short Term Goals - 07/04/18 1049      PT SHORT TERM GOAL #1   Title  Patient to be independent with initial HEP.    Time  3    Period  Weeks    Status  Achieved    Target Date  07/15/18        PT Long Term Goals -  07/01/18 1204      PT LONG TERM GOAL #1   Title  Patient to be independent with advanced HEP.    Time  6    Period  Weeks    Status  On-going      PT LONG TERM GOAL #2   Title  Patient to demonstrate B LE strength >=4+/5.    Time  6    Period  Weeks    Status  On-going      PT LONG TERM GOAL #3   Title  Patient to demonstrate [facility] and pain-free lumbar AROM.    Time  6    Period  Weeks    Status  On-going      PT LONG TERM GOAL #4   Title  Patient to demonstrate B knee AROM WFL and without pain limiting.    Time  6    Period  Weeks    Status  On-going      PT LONG TERM GOAL #5   Title  Patient to report 0/10 pain in average with STS.    Time  6    Period  Weeks    Status  On-going      PT LONG TERM GOAL #6   Title  Patient to report 1 mile of walking before onset of pain.    Time  6    Period  Weeks    Status  On-going            Plan - 07/04/18 1044    Clinical Impression Statement  Patient arrived to session with report of improvement in buttock pain since last session. Patient mentioning benefit from [facility] and planning to go to massage therapist today. Introduced gentle lumbopelvic ROM today- patient with good understanding of pelvic tilts and able to maintain posterior pelvic tilt and belly contraction well with overhead medball reach. Introduced clamshells with manual assistance to maintain hips in neutral. Patient tolerated all ther-ex well, with the exception of mild but tolerable LBP with mini squats. Updated HEP with clamshells- patient reported understanding. No further complaints at end of session.    Comorbidities  HTN, osteoporosis, GERD, hx breast CA with B mastectomy 2017, asthma, anxiety, anemia, R knee arthroscopy 2000, L arm lymphedema    PT Treatment/Interventions  ADLs/Self Care Home Management;Cryotherapy;Electrical Stimulation;Iontophoresis 4mg /ml Dexamethasone;Moist Heat;Ultrasound;Gait training;Stair training;Functional mobility training;Therapeutic  activities;Therapeutic exercise;Balance training;Manual techniques;Orthotic Fit/Training;Patient/family education;Neuromuscular re-education;Passive range of motion;Dry needling;Energy conservation;Taping;Vasopneumatic Device    PT Next Visit Plan  KT tape to B knees, quad & HS stretching, STM to B buttock, hip and core strengthening    Consulted and Agree with Plan of Care  Patient       Patient will benefit from skilled therapeutic intervention in order to improve the following deficits and impairments:  Hypomobility, Increased edema, Decreased activity tolerance, Decreased strength, Pain, Difficulty walking, Decreased mobility, Decreased balance, Decreased range of motion, Improper body mechanics, Postural dysfunction, Impaired flexibility  Visit Diagnosis: 1. Chronic bilateral low back pain with left-sided sciatica   2. Chronic pain of right knee   3. Chronic pain of left knee   4. Muscle weakness (generalized)   5. Difficulty in walking, not elsewhere classified        Problem List Patient Active Problem List   Diagnosis Date Noted  . Bilateral hip pain 06/19/2018  . Pain of both hip joints 06/19/2018  . Chronic left-sided low back pain with left-sided sciatica 06/19/2018  . Elevated cholesterol 01/15/2018  . Arthritis of knee, left 07/02/2017  . Arthritis of right knee 01/29/2017  . Baker's cyst of knee, left 01/02/2017  . Perennial allergic rhinitis with a probable nonallergic component 12/12/2016  . Neuropathy 11/19/2016  . Chronic pain 11/19/2016  . Dysphonia 10/03/2016  . Vocal cord atrophy 10/03/2016  . Moderate persistent asthma 07/10/2016  .  Bronchitis 07/10/2016  . Panic attack 10/23/2015  . Anemia 10/22/2015  . Chemotherapy induced diarrhea 10/17/2015  . Osteoporosis 09/07/2015  . Generalized anxiety disorder 09/07/2015  . Osteoarthritis of right knee 09/07/2015  . GERD (gastroesophageal reflux disease) 09/07/2015  . Depression 09/07/2015  . Acquired absence  of left breast and nipple 06/30/2015  . Breast cancer of upper-inner quadrant of left female breast ([address]) 05/26/2015    Janene Harvey, PT, DPT 07/04/18 10:50 AM   [facility] [address]  [place], [place], [address] Phone: [phone]   Fax:  [phone]  Name: RAVONDA BRECHEEN MRN: [id number] Date of Birth: [birth year]

## 2018-07-05 DIAGNOSIS — K0499 Other diseases of pulp and periapical tissues: Secondary | ICD-10-CM | POA: Diagnosis not present

## 2018-07-07 ENCOUNTER — Other Ambulatory Visit: Payer: Self-pay

## 2018-07-07 DIAGNOSIS — C50212 Malignant neoplasm of upper-inner quadrant of left female breast: Secondary | ICD-10-CM

## 2018-07-07 DIAGNOSIS — Z170 Estrogen receptor positive status [ER+]: Secondary | ICD-10-CM

## 2018-07-07 NOTE — Progress Notes (Signed)
Patient Care Team: Terald Sleeper, PA-C as PCP - General (General Practice) Nicholas Lose, MD as Consulting Physician (Hematology and Oncology) Delice Bison, Charlestine Massed, NP as Nurse Practitioner (Hematology and Oncology) Irene Limbo, MD as Consulting Physician (Plastic Surgery) Excell Seltzer, MD as Consulting Physician (General Surgery)  DIAGNOSIS:    ICD-10-CM   1. Malignant neoplasm of upper-inner quadrant of left breast in female, estrogen receptor positive ([place])  C50.212    Z17.0     SUMMARY OF ONCOLOGIC HISTORY: Oncology History  Breast cancer of upper-inner quadrant of left female breast ([place])  05/26/2015 Initial Diagnosis   Screen detected left breast asymmetry (posterior 1.6 cm): Grade 2-3 IDC ER/PR positive HER-2 positive Ki-67 20% plus calcs (span 6.1 cm): High-grade DCIS with suspicious foci of invasion (4.2 cm apart)   06/24/2015 Surgery   Left simple mastectomy with reconstruction: IDC grade 3, 2.5 cm, with high-grade DCIS, ALH, LVI present, margins negative, 0/1 sentinel node negative, T2 N0 stage II a, ER 100%, PR 5%, HER-2 positive ratio 1.56 with average copy #8.25, Ki-67 20%   07/25/2015 - 11/03/2015 Chemotherapy   Adjuvant chemotherapy with TCH Perjeta 6 cycles followed by Herceptin maintenance for 1 year   11/28/2015 -  Anti-estrogen oral therapy   Adjuvant anastrozole 1 mg daily     CHIEF COMPLIANT: Follow-up on anastrozole therapy  INTERVAL HISTORY: Heidi Walls is a 63 y.o. with above-mentioned history of left breast cancer treated with mastectomy followed by adjuvant chemotherapy and is currently on anastrozole therapy. I last saw her a year ago. Mammogram on 06/12/17 showed no evidence of malignancy. She presents to the clinic today for annual follow-up.   REVIEW OF SYSTEMS:   Constitutional: Denies fevers, chills or abnormal weight loss Eyes: Denies blurriness of vision Ears, nose, mouth, throat, and face: Denies mucositis or sore throat  Respiratory: Denies cough, dyspnea or wheezes Cardiovascular: Denies palpitation, chest discomfort Gastrointestinal: Denies nausea, heartburn or change in bowel habits Skin: Denies abnormal skin rashes Lymphatics: Denies new lymphadenopathy or easy bruising Neurological: Denies numbness, tingling or new weaknesses Behavioral/Psych: Mood is stable, no new changes  Extremities: No lower extremity edema Breast: denies any pain or lumps or nodules in either breasts All other systems were reviewed with the patient and are negative.  I have reviewed the past medical history, past surgical history, social history and family history with the patient and they are unchanged from previous note.  ALLERGIES:  is allergic to codeine; nsaids; lisinopril; and sulfamethoxazole.  MEDICATIONS:  Current Outpatient Medications  Medication Sig Dispense Refill  . acetaminophen (TYLENOL) 325 MG tablet Take 650 mg by mouth daily.    Marland Kitchen albuterol (PROAIR HFA) 108 (90 Base) MCG/ACT inhaler Inhale 2 puffs into the lungs every 4 (four) hours as needed for wheezing or shortness of breath. 3 Inhaler 3  . alendronate (FOSAMAX) 70 MG tablet Take 1 tablet (70 mg total) by mouth every 7 (seven) days. Take with a full glass of water on an empty stomach. 12 tablet 3  . ALPRAZolam (XANAX) 0.5 MG tablet Take 1 tablet (0.5 mg total) by mouth 3 (three) times daily as needed. 90 tablet 5  . Azelastine HCl 0.15 % SOLN One spray each nostril 1-2 times a day as needed 30 mL 5  . B Complex-C (SUPER B COMPLEX PO) Take 1 tablet by mouth daily.    . budesonide-formoterol (SYMBICORT) 160-4.5 MCG/ACT inhaler 2 puffs twice daily with spacer device to prevent coughing or wheezing. 3 Inhaler 3  .  Calcium Carb-Cholecalciferol (CALCIUM 600 + D PO) Take 1 tablet by mouth daily.    . Carbinoxamine Maleate 4 MG TABS TAKE 1 TABLET EVERY 8 HOURS AS NEEDED FOR RUNNY NOSE OR ITCHING. 120 tablet 0  . diclofenac sodium (VOLTAREN) 1 % GEL Apply 4 g  topically 4 (four) times daily. 400 g 11  . DULoxetine (CYMBALTA) 60 MG capsule Take 1 capsule (60 mg total) by mouth daily. 90 capsule 3  . esomeprazole (NEXIUM) 20 MG capsule Take 1 capsule (20 mg total) by mouth daily. 90 capsule 3  . fluconazole (DIFLUCAN) 200 MG tablet 1 po q week x 4 weeks 12 tablet 3  . FOLIC ACID PO Take 1 tablet by mouth daily.    Marland Kitchen gabapentin (NEURONTIN) 600 MG tablet TAKE 1 TABLET BY MOUTH THREE TIMES A DAY 270 tablet 1  . letrozole (FEMARA) 2.5 MG tablet Take 1 tablet (2.5 mg total) by mouth daily. 90 tablet 3  . montelukast (SINGULAIR) 10 MG tablet Take 1 tablet (10 mg total) by mouth at bedtime. 90 tablet 3  . Multiple Vitamin (MULTIVITAMIN) tablet Take 1 tablet by mouth daily.    Marland Kitchen olmesartan-hydrochlorothiazide (BENICAR HCT) 20-12.5 MG tablet Take 1 tablet by mouth daily. 90 tablet 3  . olopatadine (PATANOL) 0.1 % ophthalmic solution Place 1 drop into both eyes 2 (two) times daily. 15 mL 3  . Omega-3 Fatty Acids (FISH OIL) 1200 MG CAPS Take 1,200 mg by mouth daily.    . Tiotropium Bromide Monohydrate (SPIRIVA RESPIMAT) 1.25 MCG/ACT AERS Inhale 2 puffs into the lungs daily. (Patient not taking: Reported on 06/24/2018) 4 g 5  . traMADol (ULTRAM) 50 MG tablet Take 1 tablet (50 mg total) by mouth every 6 (six) hours as needed for severe pain. 120 tablet 5  . valACYclovir (VALTREX) 1000 MG tablet Take 1,000 mg by mouth as needed.   6   No current facility-administered medications for this visit.     PHYSICAL EXAMINATION: ECOG PERFORMANCE STATUS: 1 - Symptomatic but completely ambulatory  Vitals:   07/08/18 0910  BP: (!) 148/75  Pulse: (!) 110  Resp: 17  Temp: 98.3 F (36.8 C)  SpO2: 99%   Filed Weights   07/08/18 0910  Weight: 163 lb 11.2 oz (74.3 kg)    GENERAL: alert, no distress and comfortable SKIN: skin color, texture, turgor are normal, no rashes or significant lesions EYES: normal, Conjunctiva are pink and non-injected, sclera clear OROPHARYNX:  no exudate, no erythema and lips, buccal mucosa, and tongue normal  NECK: supple, thyroid normal size, non-tender, without nodularity LYMPH: no palpable lymphadenopathy in the cervical, axillary or inguinal LUNGS: clear to auscultation and percussion with normal breathing effort HEART: regular rate & rhythm and no murmurs and no lower extremity edema ABDOMEN: abdomen soft, non-tender and normal bowel sounds MUSCULOSKELETAL: Diffuse muscle aches and pains throughout her body NEURO: alert & oriented x 3 with fluent speech, no focal motor/sensory deficits EXTREMITIES: Left foot fractures   LABORATORY DATA:  I have reviewed the data as listed CMP Latest Ref Rng & Units 01/13/2018 11/04/2017 10/03/2017  Glucose 65 - 99 mg/dL 110(H) 105(H) 110(H)  BUN 8 - 27 mg/dL 20 46(H) 32(H)  Creatinine 0.57 - 1.00 mg/dL 1.21(H) 1.57(H) 1.42(H)  Sodium 134 - 144 mmol/L 136 134 136  Potassium 3.5 - 5.2 mmol/L 4.4 5.2 4.7  Chloride 96 - 106 mmol/L 98 98 96  CO2 20 - 29 mmol/L '25 22 25  '$ Calcium 8.7 - 10.3 mg/dL 10.4(H)  9.3 10.2  Total Protein 6.0 - 8.5 g/dL 7.0 6.7 7.1  Total Bilirubin 0.0 - 1.2 mg/dL <0.2 <0.2 0.2  Alkaline Phos 39 - 117 IU/L 93 80 83  AST 0 - 40 IU/L 18 51(H) 24  ALT 0 - 32 IU/L 19 32 26    Lab Results  Component Value Date   WBC 7.1 07/08/2018   HGB 11.1 (L) 07/08/2018   HCT 35.0 (L) 07/08/2018   MCV 85.6 07/08/2018   PLT 287 07/08/2018   NEUTROABS 4.8 07/08/2018    ASSESSMENT & PLAN:  Breast cancer of upper-inner quadrant of left female breast ([place]) Left simple mastectomy with reconstruction 06/24/2015: IDC grade 3, 2.5 cm, with high-grade DCIS, ALH, LVI present, margins negative, 0/1 sentinel node negative, , ER 100%, PR 5%, HER-2 positive ratio 1.56 with average copy #8.25, Ki-67 20%  Pathologic stage:T2 N0 stage II a Treatment plan: 1. Adjuvant chemotherapy with TCHP X 6 completed 10/30/17followed by Herceptin maintenance for 1 year which completed 08/07/2016 2. Followed  by adjuvant hormonal therapy with anastrozole started 11/28/2015 ---------------------------------------------------------------------------------------------------------- Current treatment:  Echocardiogram 05/22/16 LVEF 60-65% Anastrozole started 11/28/15  Anastrozole Toxicities:  1.  Severe hot flashes 2. muscle stiffness and achiness  Memory loss  Because of this I recommended switching her to exemestane. She does not want to take tamoxifen because her neighbor takes it and does not like it.  Bone density test 04/12/2016: T score -2.2. Osteopenia. She is taking Fosamax and calcium and vitamin D. Left foot fractures from walking 5K: Currently on a boot  Breast cancer surveillance: Right breast mammogram 06/12/2017: Benign at [facility] breast density category C  Anxiety: On antianxiety medications  I will see her back in 3 months for at the oximetry video visit to evaluate toxicities related to exemestane.    No orders of the defined types were placed in this encounter.  The patient has a good understanding of the overall plan. she agrees with it. she will call with any problems that may develop before the next visit here.  Nicholas Lose, MD 07/08/2018  Heidi Walls am acting as scribe for Dr. Nicholas Lose.  I have reviewed the above documentation for accuracy and completeness, and I agree with the above.

## 2018-07-08 ENCOUNTER — Inpatient Hospital Stay: Payer: BC Managed Care – PPO | Attending: Hematology and Oncology | Admitting: Hematology and Oncology

## 2018-07-08 ENCOUNTER — Ambulatory Visit: Payer: BC Managed Care – PPO | Admitting: Physical Therapy

## 2018-07-08 ENCOUNTER — Encounter: Payer: Self-pay | Admitting: Physical Therapy

## 2018-07-08 ENCOUNTER — Other Ambulatory Visit: Payer: Self-pay

## 2018-07-08 ENCOUNTER — Inpatient Hospital Stay: Payer: BC Managed Care – PPO

## 2018-07-08 DIAGNOSIS — C50212 Malignant neoplasm of upper-inner quadrant of left female breast: Secondary | ICD-10-CM

## 2018-07-08 DIAGNOSIS — R262 Difficulty in walking, not elsewhere classified: Secondary | ICD-10-CM | POA: Diagnosis not present

## 2018-07-08 DIAGNOSIS — Z79899 Other long term (current) drug therapy: Secondary | ICD-10-CM

## 2018-07-08 DIAGNOSIS — M25561 Pain in right knee: Secondary | ICD-10-CM

## 2018-07-08 DIAGNOSIS — Z170 Estrogen receptor positive status [ER+]: Secondary | ICD-10-CM

## 2018-07-08 DIAGNOSIS — Z79811 Long term (current) use of aromatase inhibitors: Secondary | ICD-10-CM | POA: Insufficient documentation

## 2018-07-08 DIAGNOSIS — M6281 Muscle weakness (generalized): Secondary | ICD-10-CM | POA: Diagnosis not present

## 2018-07-08 DIAGNOSIS — M25562 Pain in left knee: Secondary | ICD-10-CM | POA: Diagnosis not present

## 2018-07-08 DIAGNOSIS — G8929 Other chronic pain: Secondary | ICD-10-CM | POA: Diagnosis not present

## 2018-07-08 DIAGNOSIS — M5442 Lumbago with sciatica, left side: Secondary | ICD-10-CM | POA: Diagnosis not present

## 2018-07-08 LAB — CMP (CANCER CENTER ONLY)
ALT: 20 U/L (ref 0–44)
AST: 19 U/L (ref 15–41)
Albumin: 4 g/dL (ref 3.5–5.0)
Alkaline Phosphatase: 112 U/L (ref 38–126)
Anion gap: 13 (ref 5–15)
BUN: 18 mg/dL (ref 8–23)
CO2: 28 mmol/L (ref 22–32)
Calcium: 9.9 mg/dL (ref 8.9–10.3)
Chloride: 98 mmol/L (ref 98–111)
Creatinine: 1.24 mg/dL — ABNORMAL HIGH (ref 0.44–1.00)
GFR, Est AFR Am: 54 mL/min — ABNORMAL LOW (ref >60)
GFR, Estimated: 47 mL/min — ABNORMAL LOW (ref >60)
Glucose, Bld: 84 mg/dL (ref 70–99)
Potassium: 3.1 mmol/L — ABNORMAL LOW (ref 3.5–5.1)
Sodium: 139 mmol/L (ref 135–145)
Total Bilirubin: 0.2 mg/dL — ABNORMAL LOW (ref 0.3–1.2)
Total Protein: 7.9 g/dL (ref 6.5–8.1)

## 2018-07-08 LAB — CBC WITH DIFFERENTIAL (CANCER CENTER ONLY)
Abs Immature Granulocytes: 0.02 10*3/uL (ref 0.00–0.07)
Basophils Absolute: 0 10*3/uL (ref 0.0–0.1)
Basophils Relative: 1 %
Eosinophils Absolute: 0.1 10*3/uL (ref 0.0–0.5)
Eosinophils Relative: 2 %
HCT: 35 % — ABNORMAL LOW (ref 36.0–46.0)
Hemoglobin: 11.1 g/dL — ABNORMAL LOW (ref 12.0–15.0)
Immature Granulocytes: 0 %
Lymphocytes Relative: 21 %
Lymphs Abs: 1.5 10*3/uL (ref 0.7–4.0)
MCH: 27.1 pg (ref 26.0–34.0)
MCHC: 31.7 g/dL (ref 30.0–36.0)
MCV: 85.6 fL (ref 80.0–100.0)
Monocytes Absolute: 0.6 10*3/uL (ref 0.1–1.0)
Monocytes Relative: 9 %
Neutro Abs: 4.8 10*3/uL (ref 1.7–7.7)
Neutrophils Relative %: 67 %
Platelet Count: 287 10*3/uL (ref 150–400)
RBC: 4.09 MIL/uL (ref 3.87–5.11)
RDW: 14.8 % (ref 11.5–15.5)
WBC Count: 7.1 10*3/uL (ref 4.0–10.5)
nRBC: 0 % (ref 0.0–0.2)

## 2018-07-08 MED ORDER — EXEMESTANE 25 MG PO TABS: 25.0000 mg | ORAL_TABLET | Freq: Every day | ORAL | 3 refills | Status: DC

## 2018-07-08 NOTE — Therapy (Signed)
[facility] [address]  [place], [place], [address] Phone: [phone]   Fax:  [phone]  Physical Therapy Treatment  Patient Details  Name: Heidi Walls MRN: [id number] Date of Birth: 1955/07/23 Referring Provider (PT): Joni Fears, MD   Encounter Date: 07/08/2018  PT End of Session - 07/08/18 1349    Visit Number  4    Number of Visits  13    Date for PT Re-Evaluation  08/05/18    Authorization Type  [employer]; VL: 55    Authorization - Visit Number  4    Authorization - Number of Visits  30    PT Start Time  6759    PT Stop Time  1346    PT Time Calculation (min)  48 min    Activity Tolerance  Patient tolerated treatment well    Behavior During Therapy  [facility] for tasks assessed/performed       Past Medical History:  Diagnosis Date  . Anemia   . Anxiety    OCD  . Arthritis    knees and hips  . Asthma   . Breast cancer ([place])   . Breast cancer of upper-inner quadrant of left female breast ([place]) 05/26/2015  . GERD (gastroesophageal reflux disease)   . Hypertension   . Insomnia     Past Surgical History:  Procedure Laterality Date  . BREAST RECONSTRUCTION WITH PLACEMENT OF TISSUE EXPANDER AND FLEX HD (ACELLULAR HYDRATED DERMIS) Left 06/24/2015   Procedure: LEFT BREAST RECONSTRUCTION WITH PLACEMENT OF TISSUE EXPANDER AND  ACELLULAR HYDRATED DERMIS (CORTIVA);  Surgeon: Irene Limbo, MD;  Location: [place];  Service: Plastics;  Laterality: Left;  . KNEE SURGERY Right 2000   arthroscopy  . LIPOSUCTION WITH LIPOFILLING Left 12/23/2015   Procedure: LIPOFILLING TO LEFT CHEST FROM ABDOMEN  ;  Surgeon: Irene Limbo, MD;  Location: [place];  Service: Plastics;  Laterality: Left;  Marland Kitchen MASTECTOMY W/ SENTINEL NODE BIOPSY Left 06/24/2015   Procedure: LEFT TOTAL MASTECTOMY WITH SENTINEL LYMPH NODE BIOPSY;  Surgeon: Excell Seltzer, MD;  Location: [place];   Service: General;  Laterality: Left;  Marland Kitchen MASTOPEXY Right 12/23/2015   Procedure: RIGHT BREAST MASTOPEXY;  Surgeon: Irene Limbo, MD;  Location: [place];  Service: Plastics;  Laterality: Right;  . MICROLARYNGOSCOPY W/VOCAL CORD INJECTION N/A 10/15/2016   Procedure: SUSPENDED DIRECT MICROLARYNGOSCOPY WITH VOCAL CORD INJECTION WITH JET VENTILATION;  Surgeon: Melida Quitter, MD;  Location: [place];  Service: ENT;  Laterality: N/A;  . PORTACATH PLACEMENT Right 06/24/2015   Procedure: INSERTION PORT-A-CATH;  Surgeon: Excell Seltzer, MD;  Location: [place];  Service: General;  Laterality: Right;  . portacath removal  09/05/2016  . REMOVAL OF BILATERAL TISSUE EXPANDERS WITH PLACEMENT OF BILATERAL BREAST IMPLANTS Left 12/23/2015   Procedure: REMOVAL OF LEFT  TISSUE EXPANDERS WITH PLACEMENT OF LEFT  BREAST IMPLANTS;  Surgeon: Irene Limbo, MD;  Location: [place];  Service: Plastics;  Laterality: Left;  . TONSILLECTOMY      There were no vitals filed for this visit.  Subjective Assessment - 07/08/18 1257    Subjective  Reports that she is getting better- hips don't hurt half as bad as they did. Has a tooth abcess and felt "terrible" over the weekend.    Pertinent History  HTN, osteoporosis, GERD, hx breast CA with B mastectomy 2017, asthma, anxiety, anemia, R knee arthroscopy 2000, L arm lymphedema  Diagnostic tests  06/19/18 lumbar xray: There is some narrowing of the L5-S1 disc space.  There are anterior osteophytes at L3-L4 and to a lesser extent canal appears to be patent.  Minimal sclerosis about the SI joints. Some facet sclerosis at L4-5 and particularly L5-S1. 06/19/18 pelvis xray:The hip joints appear to be well-maintained very minimal subchondral cysts more on the right than the left hip but quite subtle.    Patient Stated Goals  get rid of pain    Currently in Pain?  Yes    Pain Score  2     Pain Location  Back    Pain Orientation   Left;Right;Lower    Pain Descriptors / Indicators  Aching    Pain Type  Chronic pain                       [facility] Adult PT Treatment/Exercise - 07/08/18 0001      Exercises   Exercises  Lumbar;Knee/Hip      Lumbar Exercises: Aerobic   Nustep  L4 x 6 min (LEs only)      Lumbar Exercises: Standing   Row  Strengthening;Both;Theraband;15 reps    Theraband Level (Row)  Level 2 (Red)    Row Limitations  2x15; cues for core contraction and scap retraction      Lumbar Exercises: Seated   Sit to Stand  10 reps    Sit to Stand Limitations  STS hovering bottom over mat      Lumbar Exercises: Supine   Pelvic Tilt  10 reps    Pelvic Tilt Limitations  anterior/posterior pelvic tilts    cues to stop before pain   Dead Bug  10 reps    Dead Bug Limitations  orange pball over belly    good coordination   Other Supine Lumbar Exercises  overhead yellow medball lift x10   cues to decrease speed     Lumbar Exercises: Sidelying   Clam  Right;Left;10 reps    Clam Limitations  yellow TB    Hip Abduction  Right;Left;10 reps    Hip Abduction Weights (lbs)  good alignment      Knee/Hip Exercises: Stretches   Piriformis Stretch  Right;Left;1 rep;30 seconds    Piriformis Stretch Limitations  supine KTOS    Other Knee/Hip Stretches  figure 4 stretch B 30"       Knee/Hip Exercises: Seated   Other Seated Knee/Hip Exercises  sitting hip IR with yellow TB and ball squeeze x15      Knee/Hip Exercises: Sidelying   Hip ADduction  Strengthening;Right;Left;1 set;10 reps    Hip ADduction Limitations  opposite LE propped on bolster               PT Short Term Goals - 07/04/18 1049      PT SHORT TERM GOAL #1   Title  Patient to be independent with initial HEP.    Time  3    Period  Weeks    Status  Achieved    Target Date  07/15/18        PT Long Term Goals - 07/01/18 1204      PT LONG TERM GOAL #1   Title  Patient to be independent with advanced HEP.    Time  6     Period  Weeks    Status  On-going      PT LONG TERM GOAL #2   Title  Patient to demonstrate B LE strength >=  4+/5.    Time  6    Period  Weeks    Status  On-going      PT LONG TERM GOAL #3   Title  Patient to demonstrate [facility] and pain-free lumbar AROM.    Time  6    Period  Weeks    Status  On-going      PT LONG TERM GOAL #4   Title  Patient to demonstrate B knee AROM WFL and without pain limiting.    Time  6    Period  Weeks    Status  On-going      PT LONG TERM GOAL #5   Title  Patient to report 0/10 pain in average with STS.    Time  6    Period  Weeks    Status  On-going      PT LONG TERM GOAL #6   Title  Patient to report 1 mile of walking before onset of pain.    Time  6    Period  Weeks    Status  On-going            Plan - 07/08/18 1350    Clinical Impression Statement  Patient reporting that she has noticed significant improvement in B buttock and knee pain since starting PT. Patient better able to tolerate piriformis stretching- still with some mild pain with KTOS stretch. Worked on core stabilization with overhead medball lifts and deadbug- patient demonstrating good form but with c/o mild knee and rhomboid pain with full extension. Introduced scapular row with light resistance and cues for core contraction- patient requiring verbal cues for form. Worked on mini squat to mat surface with patient reporting decreased knee pain. No complaints at end of session.    Comorbidities  HTN, osteoporosis, GERD, hx breast CA with B mastectomy 2017, asthma, anxiety, anemia, R knee arthroscopy 2000, L arm lymphedema    PT Treatment/Interventions  ADLs/Self Care Home Management;Cryotherapy;Electrical Stimulation;Iontophoresis 4mg /ml Dexamethasone;Moist Heat;Ultrasound;Gait training;Stair training;Functional mobility training;Therapeutic activities;Therapeutic exercise;Balance training;Manual techniques;Orthotic Fit/Training;Patient/family education;Neuromuscular  re-education;Passive range of motion;Dry needling;Energy conservation;Taping;Vasopneumatic Device    PT Next Visit Plan  KT tape to B knees, quad & HS stretching, STM to B buttock, hip and core strengthening    Consulted and Agree with Plan of Care  Patient       Patient will benefit from skilled therapeutic intervention in order to improve the following deficits and impairments:  Hypomobility, Increased edema, Decreased activity tolerance, Decreased strength, Pain, Difficulty walking, Decreased mobility, Decreased balance, Decreased range of motion, Improper body mechanics, Postural dysfunction, Impaired flexibility  Visit Diagnosis: 1. Chronic bilateral low back pain with left-sided sciatica   2. Chronic pain of right knee   3. Chronic pain of left knee   4. Muscle weakness (generalized)   5. Difficulty in walking, not elsewhere classified        Problem List Patient Active Problem List   Diagnosis Date Noted  . Bilateral hip pain 06/19/2018  . Pain of both hip joints 06/19/2018  . Chronic left-sided low back pain with left-sided sciatica 06/19/2018  . Elevated cholesterol 01/15/2018  . Arthritis of knee, left 07/02/2017  . Arthritis of right knee 01/29/2017  . Baker's cyst of knee, left 01/02/2017  . Perennial allergic rhinitis with a probable nonallergic component 12/12/2016  . Neuropathy 11/19/2016  . Chronic pain 11/19/2016  . Dysphonia 10/03/2016  . Vocal cord atrophy 10/03/2016  . Moderate persistent asthma 07/10/2016  . Bronchitis 07/10/2016  . Panic  attack 10/23/2015  . Anemia 10/22/2015  . Chemotherapy induced diarrhea 10/17/2015  . Osteoporosis 09/07/2015  . Generalized anxiety disorder 09/07/2015  . Osteoarthritis of right knee 09/07/2015  . GERD (gastroesophageal reflux disease) 09/07/2015  . Depression 09/07/2015  . Acquired absence of left breast and nipple 06/30/2015  . Breast cancer of upper-inner quadrant of left female breast ([place]) 05/26/2015     Janene Harvey, PT, DPT 07/08/18 1:51 PM   [facility] [address]  [place], [place], [address] Phone: [phone]   Fax:  [phone]  Name: SKYANN GANIM MRN: [id number] Date of Birth: November 22, 1955

## 2018-07-09 ENCOUNTER — Telehealth: Payer: Self-pay | Admitting: Hematology and Oncology

## 2018-07-09 NOTE — Telephone Encounter (Signed)
I talk with patient regarding schedule  

## 2018-07-10 ENCOUNTER — Encounter: Payer: Self-pay | Admitting: Physician Assistant

## 2018-07-10 ENCOUNTER — Ambulatory Visit: Payer: PRIVATE HEALTH INSURANCE | Admitting: Podiatry

## 2018-07-10 DIAGNOSIS — Z853 Personal history of malignant neoplasm of breast: Secondary | ICD-10-CM | POA: Diagnosis not present

## 2018-07-10 DIAGNOSIS — J45909 Unspecified asthma, uncomplicated: Secondary | ICD-10-CM | POA: Diagnosis not present

## 2018-07-10 DIAGNOSIS — M8589 Other specified disorders of bone density and structure, multiple sites: Secondary | ICD-10-CM | POA: Diagnosis not present

## 2018-07-10 DIAGNOSIS — M84374A Stress fracture, right foot, initial encounter for fracture: Secondary | ICD-10-CM | POA: Diagnosis not present

## 2018-07-10 DIAGNOSIS — Z1231 Encounter for screening mammogram for malignant neoplasm of breast: Secondary | ICD-10-CM | POA: Diagnosis not present

## 2018-07-11 ENCOUNTER — Other Ambulatory Visit: Payer: Self-pay

## 2018-07-11 ENCOUNTER — Ambulatory Visit: Payer: BC Managed Care – PPO | Attending: Orthopaedic Surgery | Admitting: Physical Therapy

## 2018-07-11 ENCOUNTER — Encounter: Payer: Self-pay | Admitting: Physical Therapy

## 2018-07-11 DIAGNOSIS — M25562 Pain in left knee: Secondary | ICD-10-CM | POA: Insufficient documentation

## 2018-07-11 DIAGNOSIS — M6281 Muscle weakness (generalized): Secondary | ICD-10-CM | POA: Insufficient documentation

## 2018-07-11 DIAGNOSIS — M25561 Pain in right knee: Secondary | ICD-10-CM | POA: Diagnosis not present

## 2018-07-11 DIAGNOSIS — R262 Difficulty in walking, not elsewhere classified: Secondary | ICD-10-CM | POA: Insufficient documentation

## 2018-07-11 DIAGNOSIS — M5442 Lumbago with sciatica, left side: Secondary | ICD-10-CM | POA: Insufficient documentation

## 2018-07-11 DIAGNOSIS — G8929 Other chronic pain: Secondary | ICD-10-CM | POA: Insufficient documentation

## 2018-07-11 NOTE — Therapy (Signed)
[facility] [address]  [place], [place], [address] Phone: [phone]   Fax:  [phone]  Physical Therapy Treatment  Patient Details  Name: Heidi Walls MRN: [id number] Date of Birth: 06/03/1955 Referring Provider (PT): Joni Fears, MD   Encounter Date: 07/11/2018  PT End of Session - 07/11/18 1153    Visit Number  5    Number of Visits  13    Date for PT Re-Evaluation  08/05/18    Authorization Type  BCBS; VL: 67    Authorization - Visit Number  5    Authorization - Number of Visits  30    PT Start Time  5732    PT Stop Time  1148    PT Time Calculation (min)  52 min    Activity Tolerance  Patient tolerated treatment well    Behavior During Therapy  [facility] for tasks assessed/performed       Past Medical History:  Diagnosis Date  . Anemia   . Anxiety    OCD  . Arthritis    knees and hips  . Asthma   . Breast cancer ([place])   . Breast cancer of upper-inner quadrant of left female breast ([place]) 05/26/2015  . GERD (gastroesophageal reflux disease)   . Hypertension   . Insomnia     Past Surgical History:  Procedure Laterality Date  . BREAST RECONSTRUCTION WITH PLACEMENT OF TISSUE EXPANDER AND FLEX HD (ACELLULAR HYDRATED DERMIS) Left 06/24/2015   Procedure: LEFT BREAST RECONSTRUCTION WITH PLACEMENT OF TISSUE EXPANDER AND  ACELLULAR HYDRATED DERMIS (CORTIVA);  Surgeon: Irene Limbo, MD;  Location: [facility];  Service: Plastics;  Laterality: Left;  . KNEE SURGERY Right 2000   arthroscopy  . LIPOSUCTION WITH LIPOFILLING Left 12/23/2015   Procedure: LIPOFILLING TO LEFT CHEST FROM ABDOMEN  ;  Surgeon: Irene Limbo, MD;  Location: [place];  Service: Plastics;  Laterality: Left;  Marland Kitchen MASTECTOMY W/ SENTINEL NODE BIOPSY Left 06/24/2015   Procedure: LEFT TOTAL MASTECTOMY WITH SENTINEL LYMPH NODE BIOPSY;  Surgeon: Excell Seltzer, MD;  Location: [facility];   Service: General;  Laterality: Left;  Marland Kitchen MASTOPEXY Right 12/23/2015   Procedure: RIGHT BREAST MASTOPEXY;  Surgeon: Irene Limbo, MD;  Location: [facility];  Service: Plastics;  Laterality: Right;  . MICROLARYNGOSCOPY W/VOCAL CORD INJECTION N/A 10/15/2016   Procedure: SUSPENDED DIRECT MICROLARYNGOSCOPY WITH VOCAL CORD INJECTION WITH JET VENTILATION;  Surgeon: Melida Quitter, MD;  Location: [facility];  Service: ENT;  Laterality: N/A;  . PORTACATH PLACEMENT Right 06/24/2015   Procedure: INSERTION PORT-A-CATH;  Surgeon: Excell Seltzer, MD;  Location: [facility];  Service: General;  Laterality: Right;  . portacath removal  09/05/2016  . REMOVAL OF BILATERAL TISSUE EXPANDERS WITH PLACEMENT OF BILATERAL BREAST IMPLANTS Left 12/23/2015   Procedure: REMOVAL OF LEFT  TISSUE EXPANDERS WITH PLACEMENT OF LEFT  BREAST IMPLANTS;  Surgeon: Irene Limbo, MD;  Location: [facility];  Service: Plastics;  Laterality: Left;  . TONSILLECTOMY      There were no vitals filed for this visit.  Subjective Assessment - 07/11/18 1057    Subjective  Patient reports that she has not been as compliant with HEP as she has had several MD appointments this week.    Pertinent History  HTN, osteoporosis, GERD, hx breast CA with B mastectomy 2017, asthma, anxiety, anemia, R knee arthroscopy 2000, L arm lymphedema    Diagnostic tests  06/19/18  lumbar xray: There is some narrowing of the L5-S1 disc space.  There are anterior osteophytes at L3-L4 and to a lesser extent canal appears to be patent.  Minimal sclerosis about the SI joints. Some facet sclerosis at L4-5 and particularly L5-S1. 06/19/18 pelvis xray:The hip joints appear to be well-maintained very minimal subchondral cysts more on the right than the left hip but quite subtle.    Patient Stated Goals  get rid of pain    Currently in Pain?  Yes    Pain Score  3     Pain Location  Back    Pain Orientation  Left    Pain Descriptors  / Indicators  Aching    Pain Type  Chronic pain    Pain Score  3    Pain Location  Buttocks    Pain Orientation  Left    Pain Descriptors / Indicators  Aching    Pain Type  Chronic pain                       [facility] Adult PT Treatment/Exercise - 07/11/18 0001      Lumbar Exercises: Aerobic   Nustep  L3 x 6 min (LEs only)      Knee/Hip Exercises: Stretches   Sports administrator  Right;Left;1 rep;30 seconds    Quad Stretch Limitations  prone with strap    Piriformis Stretch  Right;Left;30 seconds;2 reps    Piriformis Stretch Limitations  supine KTOS    Other Knee/Hip Stretches  figure 4 stretch B 2x30"       Knee/Hip Exercises: Supine   Bridges  Strengthening;Both;1 set;10 reps    Bridges Limitations  marching bridge   reporting difficulty     Knee/Hip Exercises: Sidelying   Clams  x15 each side with yellow TB above knees   better form     Knee/Hip Exercises: Prone   Hip Extension  Strengthening;Right;Left;1 set;10 reps    Hip Extension Limitations  prone donkey kicks      Manual Therapy   Manual Therapy  Taping;Soft tissue mobilization;Myofascial release    Manual therapy comments  prone    Soft tissue mobilization  STM to R & L superior, lateral, and inferior glutes and piriformis- increased soft tissue restriction and tenderness throughout    Myofascial Release  manual TPR to STM to R & L superior, lateral, and inferior glutes and piriformis- increased soft tissue restriction L>R    Kinesiotex  IT sales professional  B chondromalacia pattern with 50% stretch on longer strips, 80% stretch over small trip over patellar tendon              PT Education - 07/11/18 1152    Education Details  edu on KT tape wear time, precautions, removal    Person(s) Educated  Patient    Methods  Explanation    Comprehension  Verbalized understanding       PT Short Term Goals - 07/04/18 1049      PT SHORT TERM GOAL #1   Title  Patient to be  independent with initial HEP.    Time  3    Period  Weeks    Status  Achieved    Target Date  07/15/18        PT Long Term Goals - 07/01/18 1204      PT LONG TERM GOAL #1   Title  Patient to be independent with advanced  HEP.    Time  6    Period  Weeks    Status  On-going      PT LONG TERM GOAL #2   Title  Patient to demonstrate B LE strength >=4+/5.    Time  6    Period  Weeks    Status  On-going      PT LONG TERM GOAL #3   Title  Patient to demonstrate [facility] and pain-free lumbar AROM.    Time  6    Period  Weeks    Status  On-going      PT LONG TERM GOAL #4   Title  Patient to demonstrate B knee AROM WFL and without pain limiting.    Time  6    Period  Weeks    Status  On-going      PT LONG TERM GOAL #5   Title  Patient to report 0/10 pain in average with STS.    Time  6    Period  Weeks    Status  On-going      PT LONG TERM GOAL #6   Title  Patient to report 1 mile of walking before onset of pain.    Time  6    Period  Weeks    Status  On-going            Plan - 07/11/18 1153    Clinical Impression Statement  Patient reporting decreased compliance with HEP this week d/t MD appointments. Introduced Museum/gallery exhibitions officer for [employer]. Patient reporting difficulty with this exercise d/t weakness. Able to tolerance increased reps and improved form with clamshells. Tolerated all other ther-ex without complaints. Patient received KT tape to B knees for pain relief and reported mild but instant improvement in pain. Ended session with STM and manual TPR to B superior, lateral, inferior glutes and piriformis; increased tenderness and soft tissue restriction L>R. Patient reported benefit from manual therapy and without complaints at end of session.    Comorbidities  HTN, osteoporosis, GERD, hx breast CA with B mastectomy 2017, asthma, anxiety, anemia, R knee arthroscopy 2000, L arm lymphedema    PT Treatment/Interventions  ADLs/Self Care Home  Management;Cryotherapy;Electrical Stimulation;Iontophoresis 4mg /ml Dexamethasone;Moist Heat;Ultrasound;Gait training;Stair training;Functional mobility training;Therapeutic activities;Therapeutic exercise;Balance training;Manual techniques;Orthotic Fit/Training;Patient/family education;Neuromuscular re-education;Passive range of motion;Dry needling;Energy conservation;Taping;Vasopneumatic Device    PT Next Visit Plan  assess benefit from taping, quad & HS stretching, STM to B buttock, hip and core strengthening    Consulted and Agree with Plan of Care  Patient       Patient will benefit from skilled therapeutic intervention in order to improve the following deficits and impairments:  Hypomobility, Increased edema, Decreased activity tolerance, Decreased strength, Pain, Difficulty walking, Decreased mobility, Decreased balance, Decreased range of motion, Improper body mechanics, Postural dysfunction, Impaired flexibility  Visit Diagnosis: 1. Chronic bilateral low back pain with left-sided sciatica   2. Chronic pain of right knee   3. Chronic pain of left knee   4. Muscle weakness (generalized)   5. Difficulty in walking, not elsewhere classified        Problem List Patient Active Problem List   Diagnosis Date Noted  . Bilateral hip pain 06/19/2018  . Pain of both hip joints 06/19/2018  . Chronic left-sided low back pain with left-sided sciatica 06/19/2018  . Elevated cholesterol 01/15/2018  . Arthritis of knee, left 07/02/2017  . Arthritis of right knee 01/29/2017  . Baker's cyst of knee, left 01/02/2017  . Perennial allergic  rhinitis with a probable nonallergic component 12/12/2016  . Neuropathy 11/19/2016  . Chronic pain 11/19/2016  . Dysphonia 10/03/2016  . Vocal cord atrophy 10/03/2016  . Moderate persistent asthma 07/10/2016  . Bronchitis 07/10/2016  . Panic attack 10/23/2015  . Anemia 10/22/2015  . Chemotherapy induced diarrhea 10/17/2015  . Osteoporosis 09/07/2015  .  Generalized anxiety disorder 09/07/2015  . Osteoarthritis of right knee 09/07/2015  . GERD (gastroesophageal reflux disease) 09/07/2015  . Depression 09/07/2015  . Acquired absence of left breast and nipple 06/30/2015  . Breast cancer of upper-inner quadrant of left female breast ([place]) 05/26/2015     Janene Harvey, PT, DPT 07/11/18 11:58 AM   [facility] [address]  [place], [place], [address] Phone: [phone]   Fax:  [phone]  Name: Heidi Walls MRN: [id number] Date of Birth: October 28, 1955

## 2018-07-15 ENCOUNTER — Other Ambulatory Visit: Payer: Self-pay

## 2018-07-15 ENCOUNTER — Ambulatory Visit: Payer: BC Managed Care – PPO | Admitting: Physical Therapy

## 2018-07-15 ENCOUNTER — Encounter: Payer: Self-pay | Admitting: Physical Therapy

## 2018-07-15 DIAGNOSIS — M6281 Muscle weakness (generalized): Secondary | ICD-10-CM

## 2018-07-15 DIAGNOSIS — R262 Difficulty in walking, not elsewhere classified: Secondary | ICD-10-CM

## 2018-07-15 DIAGNOSIS — G8929 Other chronic pain: Secondary | ICD-10-CM

## 2018-07-15 DIAGNOSIS — M5442 Lumbago with sciatica, left side: Secondary | ICD-10-CM | POA: Diagnosis not present

## 2018-07-15 DIAGNOSIS — M25562 Pain in left knee: Secondary | ICD-10-CM | POA: Diagnosis not present

## 2018-07-15 DIAGNOSIS — M25561 Pain in right knee: Secondary | ICD-10-CM | POA: Diagnosis not present

## 2018-07-15 NOTE — Therapy (Signed)
[facility] [address]  [place], [place], [address] Phone: [phone]   Fax:  [phone]  Physical Therapy Treatment  Patient Details  Name: Heidi Walls MRN: [id number] Date of Birth: 1955/04/21 Referring Provider (PT): Joni Fears, MD   Encounter Date: 07/15/2018  PT End of Session - 07/15/18 1153    Visit Number  6    Number of Visits  13    Date for PT Re-Evaluation  08/05/18    Authorization Type  BCBS; VL: 64    Authorization - Visit Number  6    Authorization - Number of Visits  30    PT Start Time  5465    PT Stop Time  1150    PT Time Calculation (min)  45 min    Activity Tolerance  Patient tolerated treatment well    Behavior During Therapy  [facility] for tasks assessed/performed       Past Medical History:  Diagnosis Date  . Anemia   . Anxiety    OCD  . Arthritis    knees and hips  . Asthma   . Breast cancer ([place])   . Breast cancer of upper-inner quadrant of left female breast ([place]) 05/26/2015  . GERD (gastroesophageal reflux disease)   . Hypertension   . Insomnia     Past Surgical History:  Procedure Laterality Date  . BREAST RECONSTRUCTION WITH PLACEMENT OF TISSUE EXPANDER AND FLEX HD (ACELLULAR HYDRATED DERMIS) Left 06/24/2015   Procedure: LEFT BREAST RECONSTRUCTION WITH PLACEMENT OF TISSUE EXPANDER AND  ACELLULAR HYDRATED DERMIS (CORTIVA);  Surgeon: Irene Limbo, MD;  Location: [facility];  Service: Plastics;  Laterality: Left;  . KNEE SURGERY Right 2000   arthroscopy  . LIPOSUCTION WITH LIPOFILLING Left 12/23/2015   Procedure: LIPOFILLING TO LEFT CHEST FROM ABDOMEN  ;  Surgeon: Irene Limbo, MD;  Location: [facility];  Service: Plastics;  Laterality: Left;  Marland Kitchen MASTECTOMY W/ SENTINEL NODE BIOPSY Left 06/24/2015   Procedure: LEFT TOTAL MASTECTOMY WITH SENTINEL LYMPH NODE BIOPSY;  Surgeon: Excell Seltzer, MD;  Location: [facility];   Service: General;  Laterality: Left;  Marland Kitchen MASTOPEXY Right 12/23/2015   Procedure: RIGHT BREAST MASTOPEXY;  Surgeon: Irene Limbo, MD;  Location: [facility];  Service: Plastics;  Laterality: Right;  . MICROLARYNGOSCOPY W/VOCAL CORD INJECTION N/A 10/15/2016   Procedure: SUSPENDED DIRECT MICROLARYNGOSCOPY WITH VOCAL CORD INJECTION WITH JET VENTILATION;  Surgeon: Melida Quitter, MD;  Location: [facility];  Service: ENT;  Laterality: N/A;  . PORTACATH PLACEMENT Right 06/24/2015   Procedure: INSERTION PORT-A-CATH;  Surgeon: Excell Seltzer, MD;  Location: [facility];  Service: General;  Laterality: Right;  . portacath removal  09/05/2016  . REMOVAL OF BILATERAL TISSUE EXPANDERS WITH PLACEMENT OF BILATERAL BREAST IMPLANTS Left 12/23/2015   Procedure: REMOVAL OF LEFT  TISSUE EXPANDERS WITH PLACEMENT OF LEFT  BREAST IMPLANTS;  Surgeon: Irene Limbo, MD;  Location: [facility];  Service: Plastics;  Laterality: Left;  . TONSILLECTOMY      There were no vitals filed for this visit.  Subjective Assessment - 07/15/18 1107    Subjective  Reporting tremendous improvement in knee pain with tape.    Pertinent History  HTN, osteoporosis, GERD, hx breast CA with B mastectomy 2017, asthma, anxiety, anemia, R knee arthroscopy 2000, L arm lymphedema    Diagnostic tests  06/19/18 lumbar xray: There is some narrowing of the L5-S1 disc space.  There are anterior osteophytes at L3-L4 and to a lesser extent canal appears to be patent.  Minimal sclerosis about the SI joints. Some facet sclerosis at L4-5 and particularly L5-S1. 06/19/18 pelvis xray:The hip joints appear to be well-maintained very minimal subchondral cysts more on the right than the left hip but quite subtle.    Patient Stated Goals  get rid of pain    Currently in Pain?  Yes    Pain Score  5     Pain Location  Back    Pain Orientation  Right;Left;Lower    Pain Descriptors / Indicators  Aching    Pain Score  3     Pain Location  Knee    Pain Orientation  Right;Left    Pain Descriptors / Indicators  Aching    Pain Type  Chronic pain                       [facility] Adult PT Treatment/Exercise - 07/15/18 0001      Lumbar Exercises: Stretches   Hip Flexor Stretch  Right;Left;1 rep;30 seconds    Hip Flexor Stretch Limitations  mod thomas with strap      Lumbar Exercises: Aerobic   Nustep  L4 x 6 min (LEs only)      Lumbar Exercises: Supine   Pelvic Tilt  10 reps    Pelvic Tilt Limitations  anterior/posterior pelvic tilts     Bridge  10 reps    Bridge Limitations  marching bridge       Lumbar Exercises: Quadruped   Madcat/Old Horse  10 reps    Madcat/Old Horse Limitations  cat/cow    good ROM   Other Quadruped Lumbar Exercises  child pose 10x3"   audible R knee cavitation     Knee/Hip Exercises: Standing   Hip Abduction  Stengthening;Right;Left;1 set;10 reps;Knee straight    Abduction Limitations  2#; cues for upright posture and core contraction    Functional Squat  1 set;15 reps    Functional Squat Limitations  TRX squat- depth to tolerance    Other Standing Knee Exercises  sidestepping with yellow TB around toes 4x length of counter top   TC's/VC's to avoid lateral trunk lean     Knee/Hip Exercises: Prone   Hip Extension  Strengthening;Right;Left;1 set;10 reps    Hip Extension Limitations  prone donkey kicks      Manual Therapy   Manual Therapy  Taping      Kinesiotix   Create Space  B chondromalacia pattern with 50% stretch on longer strips, 80% stretch over small trip over patellar tendon              PT Education - 07/15/18 1152    Education Details  step by step instruction on KT tape to B knees    Person(s) Educated  Patient    Methods  Explanation;Demonstration;Tactile cues;Verbal cues;Handout    Comprehension  Verbalized understanding;Returned demonstration       PT Short Term Goals - 07/04/18 1049      PT SHORT TERM GOAL #1   Title  Patient  to be independent with initial HEP.    Time  3    Period  Weeks    Status  Achieved    Target Date  07/15/18        PT Long Term Goals - 07/01/18 1204      PT LONG TERM GOAL #1   Title  Patient to be independent with  advanced HEP.    Time  6    Period  Weeks    Status  On-going      PT LONG TERM GOAL #2   Title  Patient to demonstrate B LE strength >=4+/5.    Time  6    Period  Weeks    Status  On-going      PT LONG TERM GOAL #3   Title  Patient to demonstrate [facility] and pain-free lumbar AROM.    Time  6    Period  Weeks    Status  On-going      PT LONG TERM GOAL #4   Title  Patient to demonstrate B knee AROM WFL and without pain limiting.    Time  6    Period  Weeks    Status  On-going      PT LONG TERM GOAL #5   Title  Patient to report 0/10 pain in average with STS.    Time  6    Period  Weeks    Status  On-going      PT LONG TERM GOAL #6   Title  Patient to report 1 mile of walking before onset of pain.    Time  6    Period  Weeks    Status  On-going            Plan - 07/15/18 1153    Clinical Impression Statement  Patient arrived to session with report of tremendous improvement in B knee pain after application of KT tape. Reporting improved ability to perform STS, stair climbing, and laundry. Worked on gentle lumbopelvic ROM with good tolerance. Able to perform glute strengthening exercises on mat with improved ease. Tolerated squats with TRX support with good form. Required manual and verbal cues to avoid lateral trunk lean with sidestepping. Ended session with KT tape to B knees with step-by-step instruction on application for carryover at home. Patient reporting instant relief at end of session.    Comorbidities  HTN, osteoporosis, GERD, hx breast CA with B mastectomy 2017, asthma, anxiety, anemia, R knee arthroscopy 2000, L arm lymphedema    PT Treatment/Interventions  ADLs/Self Care Home Management;Cryotherapy;Electrical Stimulation;Iontophoresis 4mg /ml  Dexamethasone;Moist Heat;Ultrasound;Gait training;Stair training;Functional mobility training;Therapeutic activities;Therapeutic exercise;Balance training;Manual techniques;Orthotic Fit/Training;Patient/family education;Neuromuscular re-education;Passive range of motion;Dry needling;Energy conservation;Taping;Vasopneumatic Device    PT Next Visit Plan  quad & HS stretching, STM to B buttock, hip and core strengthening    Consulted and Agree with Plan of Care  Patient       Patient will benefit from skilled therapeutic intervention in order to improve the following deficits and impairments:  Hypomobility, Increased edema, Decreased activity tolerance, Decreased strength, Pain, Difficulty walking, Decreased mobility, Decreased balance, Decreased range of motion, Improper body mechanics, Postural dysfunction, Impaired flexibility  Visit Diagnosis: 1. Chronic bilateral low back pain with left-sided sciatica   2. Chronic pain of right knee   3. Chronic pain of left knee   4. Muscle weakness (generalized)   5. Difficulty in walking, not elsewhere classified        Problem List Patient Active Problem List   Diagnosis Date Noted  . Bilateral hip pain 06/19/2018  . Pain of both hip joints 06/19/2018  . Chronic left-sided low back pain with left-sided sciatica 06/19/2018  . Elevated cholesterol 01/15/2018  . Arthritis of knee, left 07/02/2017  . Arthritis of right knee 01/29/2017  . Baker's cyst of knee, left 01/02/2017  . Perennial allergic rhinitis with a probable nonallergic  component 12/12/2016  . Neuropathy 11/19/2016  . Chronic pain 11/19/2016  . Dysphonia 10/03/2016  . Vocal cord atrophy 10/03/2016  . Moderate persistent asthma 07/10/2016  . Bronchitis 07/10/2016  . Panic attack 10/23/2015  . Anemia 10/22/2015  . Chemotherapy induced diarrhea 10/17/2015  . Osteoporosis 09/07/2015  . Generalized anxiety disorder 09/07/2015  . Osteoarthritis of right knee 09/07/2015  . GERD  (gastroesophageal reflux disease) 09/07/2015  . Depression 09/07/2015  . Acquired absence of left breast and nipple 06/30/2015  . Breast cancer of upper-inner quadrant of left female breast ([place]) 05/26/2015     Heidi Walls, PT, DPT 07/15/18 11:58 AM   [facility] [address]  [place], [place], [address] Phone: [phone]   Fax:  [phone]  Name: Heidi Walls MRN: [id number] Date of Birth: 11-19-1955

## 2018-07-16 ENCOUNTER — Telehealth: Payer: Self-pay | Admitting: Physician Assistant

## 2018-07-16 ENCOUNTER — Other Ambulatory Visit: Payer: Self-pay | Admitting: Physician Assistant

## 2018-07-16 MED ORDER — AMOXICILLIN-POT CLAVULANATE 875-125 MG PO TABS: 1.0000 | ORAL_TABLET | Freq: Two times a day (BID) | ORAL | 0 refills | Status: DC

## 2018-07-16 NOTE — Telephone Encounter (Signed)
Pt has taken a round of Amoxicillin and Clindamycin. She still has a gum infection. Her dentist is out of town until next Wednesday. Please advise

## 2018-07-16 NOTE — Telephone Encounter (Signed)
Is there a current infection?  And has she just taken an antibiotic in the past 7 to 10 days for it?  And if she had we can do a different one.  Or she has not taken it recently I can refill 1 of those.

## 2018-07-16 NOTE — Telephone Encounter (Signed)
She just finished clindamycin yesterday and took the amoxicillin right before. There is a current infection.

## 2018-07-16 NOTE — Telephone Encounter (Signed)
I have sent Augmentin to the pharmacy, 1 pill twice daily for 14 days

## 2018-07-16 NOTE — Progress Notes (Signed)
augmentin

## 2018-07-17 ENCOUNTER — Telehealth: Payer: Self-pay | Admitting: *Deleted

## 2018-07-17 ENCOUNTER — Ambulatory Visit: Payer: PRIVATE HEALTH INSURANCE | Admitting: Podiatry

## 2018-07-17 DIAGNOSIS — H40033 Anatomical narrow angle, bilateral: Secondary | ICD-10-CM | POA: Diagnosis not present

## 2018-07-17 NOTE — Telephone Encounter (Signed)
Patient aware Rx has been sent to the pharmacy.  Instructions given

## 2018-07-17 NOTE — Telephone Encounter (Addendum)
Prior Authorization for Tramadol HCL 50mg  tab-Approved  Approvedon July 9 Effective from 07/16/2018 through 08/14/2018.   If [employer] has not responded in 3 business days or if you have any questions about your submission, contact [facility] at [phone].

## 2018-07-18 ENCOUNTER — Ambulatory Visit: Payer: BC Managed Care – PPO | Admitting: Physical Therapy

## 2018-07-18 ENCOUNTER — Encounter: Payer: Self-pay | Admitting: Podiatry

## 2018-07-18 ENCOUNTER — Ambulatory Visit (INDEPENDENT_AMBULATORY_CARE_PROVIDER_SITE_OTHER): Payer: BC Managed Care – PPO

## 2018-07-18 ENCOUNTER — Ambulatory Visit (INDEPENDENT_AMBULATORY_CARE_PROVIDER_SITE_OTHER): Payer: BC Managed Care – PPO | Admitting: Podiatry

## 2018-07-18 ENCOUNTER — Other Ambulatory Visit: Payer: Self-pay

## 2018-07-18 VITALS — Temp 97.8°F

## 2018-07-18 DIAGNOSIS — S92321K Displaced fracture of second metatarsal bone, right foot, subsequent encounter for fracture with nonunion: Secondary | ICD-10-CM

## 2018-07-18 DIAGNOSIS — S92321D Displaced fracture of second metatarsal bone, right foot, subsequent encounter for fracture with routine healing: Secondary | ICD-10-CM | POA: Diagnosis not present

## 2018-07-18 DIAGNOSIS — G5761 Lesion of plantar nerve, right lower limb: Secondary | ICD-10-CM

## 2018-07-18 NOTE — Progress Notes (Signed)
Subjective:  Patient ID: Heidi Walls, female    DOB: Oct 06, 1955,  MRN: [id number]  Chief Complaint  Patient presents with  . Foot Problem    the right big toe bone hurts and i did hurt my 4 toes on the right about 3 weeks ago    63 y.o. female presents with the above complaint.  History as above.  Still having pain in the ball of hte foot and pain in toes 2, 3, and 4 in both feet but worse on right.  Review of Systems: Negative except as noted in the HPI. Denies N/V/F/Ch.  Past Medical History:  Diagnosis Date  . Anemia   . Anxiety    OCD  . Arthritis    knees and hips  . Asthma   . Breast cancer ([place])   . Breast cancer of upper-inner quadrant of left female breast ([place]) 05/26/2015  . GERD (gastroesophageal reflux disease)   . Hypertension   . Insomnia     Current Outpatient Medications:  .  acetaminophen (TYLENOL) 325 MG tablet, Take 650 mg by mouth daily., Disp: , Rfl:  .  albuterol (PROAIR HFA) 108 (90 Base) MCG/ACT inhaler, Inhale 2 puffs into the lungs every 4 (four) hours as needed for wheezing or shortness of breath., Disp: 3 Inhaler, Rfl: 3 .  alendronate (FOSAMAX) 70 MG tablet, Take 1 tablet (70 mg total) by mouth every 7 (seven) days. Take with a full glass of water on an empty stomach., Disp: 12 tablet, Rfl: 3 .  ALPRAZolam (XANAX) 0.5 MG tablet, Take 1 tablet (0.5 mg total) by mouth 3 (three) times daily as needed., Disp: 90 tablet, Rfl: 5 .  amoxicillin-clavulanate (AUGMENTIN) 875-125 MG tablet, Take 1 tablet by mouth 2 (two) times daily., Disp: 28 tablet, Rfl: 0 .  Azelastine HCl 0.15 % SOLN, One spray each nostril 1-2 times a day as needed, Disp: 30 mL, Rfl: 5 .  B Complex-C (SUPER B COMPLEX PO), Take 1 tablet by mouth daily., Disp: , Rfl:  .  budesonide-formoterol (SYMBICORT) 160-4.5 MCG/ACT inhaler, 2 puffs twice daily with spacer device to prevent coughing or wheezing., Disp: 3 Inhaler, Rfl: 3 .  Calcium Carb-Cholecalciferol (CALCIUM 600 + D PO), Take 1  tablet by mouth daily., Disp: , Rfl:  .  Carbinoxamine Maleate 4 MG TABS, TAKE 1 TABLET EVERY 8 HOURS AS NEEDED FOR RUNNY NOSE OR ITCHING., Disp: 120 tablet, Rfl: 0 .  diclofenac sodium (VOLTAREN) 1 % GEL, Apply 4 g topically 4 (four) times daily., Disp: 400 g, Rfl: 11 .  DULoxetine (CYMBALTA) 60 MG capsule, Take 1 capsule (60 mg total) by mouth daily., Disp: 90 capsule, Rfl: 3 .  esomeprazole (NEXIUM) 20 MG capsule, Take 1 capsule (20 mg total) by mouth daily., Disp: 90 capsule, Rfl: 3 .  exemestane (AROMASIN) 25 MG tablet, Take 1 tablet (25 mg total) by mouth daily after breakfast., Disp: 90 tablet, Rfl: 3 .  fluconazole (DIFLUCAN) 200 MG tablet, 1 po q week x 4 weeks, Disp: 12 tablet, Rfl: 3 .  FOLIC ACID PO, Take 1 tablet by mouth daily., Disp: , Rfl:  .  gabapentin (NEURONTIN) 600 MG tablet, TAKE 1 TABLET BY MOUTH THREE TIMES A DAY, Disp: 270 tablet, Rfl: 1 .  letrozole (FEMARA) 2.5 MG tablet, Take 1 tablet (2.5 mg total) by mouth daily., Disp: 90 tablet, Rfl: 3 .  montelukast (SINGULAIR) 10 MG tablet, Take 1 tablet (10 mg total) by mouth at bedtime., Disp: 90 tablet, Rfl: 3 .  Multiple Vitamin (MULTIVITAMIN) tablet, Take 1 tablet by mouth daily., Disp: , Rfl:  .  olmesartan-hydrochlorothiazide (BENICAR HCT) 20-12.5 MG tablet, Take 1 tablet by mouth daily., Disp: 90 tablet, Rfl: 3 .  olopatadine (PATANOL) 0.1 % ophthalmic solution, Place 1 drop into both eyes 2 (two) times daily., Disp: 15 mL, Rfl: 3 .  Omega-3 Fatty Acids (FISH OIL) 1200 MG CAPS, Take 1,200 mg by mouth daily., Disp: , Rfl:  .  Tiotropium Bromide Monohydrate (SPIRIVA RESPIMAT) 1.25 MCG/ACT AERS, Inhale 2 puffs into the lungs daily. (Patient not taking: Reported on 06/24/2018), Disp: 4 g, Rfl: 5 .  traMADol (ULTRAM) 50 MG tablet, Take 1 tablet (50 mg total) by mouth every 6 (six) hours as needed for severe pain., Disp: 120 tablet, Rfl: 5 .  valACYclovir (VALTREX) 1000 MG tablet, Take 1,000 mg by mouth as needed. , Disp: , Rfl: 6   Social History   Tobacco Use  Smoking Status Former Smoker  . Packs/day: 1.00  . Years: 25.00  . Pack years: 25.00  . Types: Cigarettes  . Quit date: 10/09/2005  . Years since quitting: 12.7  Smokeless Tobacco Never Used    Allergies  Allergen Reactions  . Codeine Shortness Of Breath    Pt reports this was 30 years ago.   . Nsaids   . Lisinopril Cough  . Sulfamethoxazole    Objective:   Vitals:   07/18/18 1006  Temp: 97.8 F (36.6 C)   There is no height or weight on file to calculate BMI. Constitutional Well developed. Well nourished.  Vascular Dorsalis pedis pulses palpable bilaterally. Posterior tibial pulses palpable bilaterally. Capillary refill normal to all digits.  No cyanosis or clubbing noted. Pedal hair growth normal.  Neurologic Normal speech. Oriented to person, place, and time. Epicritic sensation to light touch grossly present bilaterally.  Dermatologic Nails well groomed and normal in appearance. No open wounds. No skin lesions.  Orthopedic: Point tenderness right 2nd metatarsal base. POP Right 3rd interspace   Radiographs: Taken and reviewed fracture appears bridged but with evident fracture line with 58mm gapping. Assessment:   1. Displaced fracture of second metatarsal bone, right foot, subsequent encounter for fracture with nonunion    Plan:  Patient was evaluated and treated and all questions answered.  Right fracture of metatarsal base, right fourth metatarsal neck -XR taken as above. -Continue WBAT in normal shoe.  -At this point likely non-union. -Would avoid surgery given co-morbids -Rx bone stimulator  Interdigital Neuroma, right -Educated on etiology -Interspace injection delivered as below.  Procedure: Neuroma Injection Location: Right 3rd interspace Skin Prep: Alcohol. Injectate: 0.5 cc 0.5% marcaine plain, 0.5 cc dexamethasone phosphate. Disposition: Patient tolerated procedure well. Injection site dressed with a  band-aid.    Return in about 3 weeks (around 08/08/2018) for Neuroma, Right.

## 2018-07-21 ENCOUNTER — Telehealth: Payer: Self-pay | Admitting: *Deleted

## 2018-07-21 NOTE — Telephone Encounter (Signed)
-----   Message from Evelina Bucy, DPM sent at 07/18/2018  2:43 PM EDT ----- Can we order bone stim for right foot fracture non-union

## 2018-07-21 NOTE — Telephone Encounter (Signed)
I informed [facility] - T. Nicole Kindred of the orders and he stated he will pick up today.

## 2018-07-22 ENCOUNTER — Ambulatory Visit: Payer: BC Managed Care – PPO | Admitting: Physical Therapy

## 2018-07-22 ENCOUNTER — Encounter: Payer: Self-pay | Admitting: Physical Therapy

## 2018-07-22 ENCOUNTER — Other Ambulatory Visit: Payer: Self-pay

## 2018-07-22 DIAGNOSIS — M6281 Muscle weakness (generalized): Secondary | ICD-10-CM

## 2018-07-22 DIAGNOSIS — G8929 Other chronic pain: Secondary | ICD-10-CM

## 2018-07-22 DIAGNOSIS — R262 Difficulty in walking, not elsewhere classified: Secondary | ICD-10-CM

## 2018-07-22 DIAGNOSIS — M5442 Lumbago with sciatica, left side: Secondary | ICD-10-CM

## 2018-07-22 DIAGNOSIS — M25562 Pain in left knee: Secondary | ICD-10-CM

## 2018-07-22 DIAGNOSIS — M25561 Pain in right knee: Secondary | ICD-10-CM | POA: Diagnosis not present

## 2018-07-22 NOTE — Patient Instructions (Signed)

## 2018-07-22 NOTE — Therapy (Signed)
[facility] [address]  [place], [place], [address] Phone: [phone]   Fax:  [phone]  Physical Therapy Treatment  Patient Details  Name: DELANIA FERG MRN: [id number] Date of Birth: 06-19-1955 Referring Provider (PT): Joni Fears, MD   Encounter Date: 07/22/2018  PT End of Session - 07/22/18 1549    Visit Number  7    Number of Visits  13    Date for PT Re-Evaluation  08/05/18    Authorization Type  [employer]; VL: 20    Authorization - Visit Number  7    Authorization - Number of Visits  30    PT Start Time  2876    PT Stop Time  1538    PT Time Calculation (min)  46 min    Activity Tolerance  Patient tolerated treatment well    Behavior During Therapy  [facility] for tasks assessed/performed       Past Medical History:  Diagnosis Date  . Anemia   . Anxiety    OCD  . Arthritis    knees and hips  . Asthma   . Breast cancer ([place])   . Breast cancer of upper-inner quadrant of left female breast ([place]) 05/26/2015  . GERD (gastroesophageal reflux disease)   . Hypertension   . Insomnia     Past Surgical History:  Procedure Laterality Date  . BREAST RECONSTRUCTION WITH PLACEMENT OF TISSUE EXPANDER AND FLEX HD (ACELLULAR HYDRATED DERMIS) Left 06/24/2015   Procedure: LEFT BREAST RECONSTRUCTION WITH PLACEMENT OF TISSUE EXPANDER AND  ACELLULAR HYDRATED DERMIS (CORTIVA);  Surgeon: Irene Limbo, MD;  Location: [facility];  Service: Plastics;  Laterality: Left;  . KNEE SURGERY Right 2000   arthroscopy  . LIPOSUCTION WITH LIPOFILLING Left 12/23/2015   Procedure: LIPOFILLING TO LEFT CHEST FROM ABDOMEN  ;  Surgeon: Irene Limbo, MD;  Location: [facility];  Service: Plastics;  Laterality: Left;  Marland Kitchen MASTECTOMY W/ SENTINEL NODE BIOPSY Left 06/24/2015   Procedure: LEFT TOTAL MASTECTOMY WITH SENTINEL LYMPH NODE BIOPSY;  Surgeon: Excell Seltzer, MD;  Location: [facility];   Service: General;  Laterality: Left;  Marland Kitchen MASTOPEXY Right 12/23/2015   Procedure: RIGHT BREAST MASTOPEXY;  Surgeon: Irene Limbo, MD;  Location: [facility];  Service: Plastics;  Laterality: Right;  . MICROLARYNGOSCOPY W/VOCAL CORD INJECTION N/A 10/15/2016   Procedure: SUSPENDED DIRECT MICROLARYNGOSCOPY WITH VOCAL CORD INJECTION WITH JET VENTILATION;  Surgeon: Melida Quitter, MD;  Location: [facility];  Service: ENT;  Laterality: N/A;  . PORTACATH PLACEMENT Right 06/24/2015   Procedure: INSERTION PORT-A-CATH;  Surgeon: Excell Seltzer, MD;  Location: [facility];  Service: General;  Laterality: Right;  . portacath removal  09/05/2016  . REMOVAL OF BILATERAL TISSUE EXPANDERS WITH PLACEMENT OF BILATERAL BREAST IMPLANTS Left 12/23/2015   Procedure: REMOVAL OF LEFT  TISSUE EXPANDERS WITH PLACEMENT OF LEFT  BREAST IMPLANTS;  Surgeon: Irene Limbo, MD;  Location: [facility];  Service: Plastics;  Laterality: Left;  . TONSILLECTOMY      There were no vitals filed for this visit.  Subjective Assessment - 07/22/18 1452    Subjective  Reports that she is doing well today. Reporting slightly increased pain in R foot since she recevied at shot in it from her podiatrist. Reporting mild bruising still visible from tape application last session.    Pertinent History  HTN, osteoporosis, GERD, hx breast CA with B mastectomy 2017, asthma, anxiety, anemia,  R knee arthroscopy 2000, L arm lymphedema    Diagnostic tests  06/19/18 lumbar xray: There is some narrowing of the L5-S1 disc space.  There are anterior osteophytes at L3-L4 and to a lesser extent canal appears to be patent.  Minimal sclerosis about the SI joints. Some facet sclerosis at L4-5 and particularly L5-S1. 06/19/18 pelvis xray:The hip joints appear to be well-maintained very minimal subchondral cysts more on the right than the left hip but quite subtle.    Patient Stated Goals  get rid of pain    Currently in  Pain?  Yes    Pain Score  4     Pain Location  Buttocks    Pain Orientation  Right    Pain Descriptors / Indicators  Aching    Pain Type  Chronic pain    Pain Score  3    Pain Location  Knee    Pain Orientation  Right;Left    Pain Descriptors / Indicators  Aching    Pain Type  Chronic pain                       [facility] Adult PT Treatment/Exercise - 07/22/18 0001      Lumbar Exercises: Stretches   Piriformis Stretch  Right;Left;1 rep;30 seconds    Piriformis Stretch Limitations  KTOS     Figure 4 Stretch  1 rep;30 seconds;With overpressure      Lumbar Exercises: Aerobic   Stationary Bike  L1 x 74min      Lumbar Exercises: Supine   Bridge  10 reps    Bridge Limitations  straight leg bridge on orange pball    Bridge with March  10 reps      Knee/Hip Exercises: Sidelying   Hip ABduction  Strengthening;Right;Left;1 set;10 reps    Hip ABduction Limitations  1#; good alignment    Hip ADduction  Strengthening;Right;Left;1 set;10 reps    Hip ADduction Limitations  1#; opposite LE propped on bolster      Knee/Hip Exercises: Prone   Hip Extension  Strengthening;Right;Left;1 set;10 reps    Hip Extension Limitations  prone donkey kicks + abduction      Manual Therapy   Manual Therapy  Soft tissue mobilization;Myofascial release    Manual therapy comments  prone    Soft tissue mobilization  STM to R & L superior, lateral, and inferior glutes and piriformis- increased soft tissue restriction and tenderness throughout; worse on L    Myofascial Release  manual TPR to STM to R & L superior, lateral, and inferior glutes and piriformis- increased soft tissue restriction L>R               PT Short Term Goals - 07/04/18 1049      PT SHORT TERM GOAL #1   Title  Patient to be independent with initial HEP.    Time  3    Period  Weeks    Status  Achieved    Target Date  07/15/18        PT Long Term Goals - 07/01/18 1204      PT LONG TERM GOAL #1   Title   Patient to be independent with advanced HEP.    Time  6    Period  Weeks    Status  On-going      PT LONG TERM GOAL #2   Title  Patient to demonstrate B LE strength >=4+/5.    Time  6  Period  Weeks    Status  On-going      PT LONG TERM GOAL #3   Title  Patient to demonstrate [facility] and pain-free lumbar AROM.    Time  6    Period  Weeks    Status  On-going      PT LONG TERM GOAL #4   Title  Patient to demonstrate B knee AROM WFL and without pain limiting.    Time  6    Period  Weeks    Status  On-going      PT LONG TERM GOAL #5   Title  Patient to report 0/10 pain in average with STS.    Time  6    Period  Weeks    Status  On-going      PT LONG TERM GOAL #6   Title  Patient to report 1 mile of walking before onset of pain.    Time  6    Period  Weeks    Status  On-going            Plan - 07/22/18 1550    Clinical Impression Statement  Patient arrived to session with still slightly visible bruising over B knees from last KT tape application. Plan to increase length of time in between tape application and trial sensitive skin tape to avoid skin irritation. Patient agreeable. Worked on progressive hip strengthening this session with patient reporting good tolerance. Able to perform sidelying hip abduction and adduction with light resistance and good form. Patient also reporting improvement in her flexibility with figure 4 and KTOS stretches on B sides, R side still feeling slightly restricted. Ended session with STM and manual TPR to B glutes and piriformis- patient with palpable tender trigger points throughout, L >R. Ended session with report of relief after manual therapy. Administered DN edu sheet as patient may benefit from this modality.    Comorbidities  HTN, osteoporosis, GERD, hx breast CA with B mastectomy 2017, asthma, anxiety, anemia, R knee arthroscopy 2000, L arm lymphedema    PT Treatment/Interventions  ADLs/Self Care Home Management;Cryotherapy;Electrical  Stimulation;Iontophoresis 4mg /ml Dexamethasone;Moist Heat;Ultrasound;Gait training;Stair training;Functional mobility training;Therapeutic activities;Therapeutic exercise;Balance training;Manual techniques;Orthotic Fit/Training;Patient/family education;Neuromuscular re-education;Passive range of motion;Dry needling;Energy conservation;Taping;Vasopneumatic Device    PT Next Visit Plan  quad & HS stretching, STM to B buttock, hip and core strengthening    Consulted and Agree with Plan of Care  Patient       Patient will benefit from skilled therapeutic intervention in order to improve the following deficits and impairments:  Hypomobility, Increased edema, Decreased activity tolerance, Decreased strength, Pain, Difficulty walking, Decreased mobility, Decreased balance, Decreased range of motion, Improper body mechanics, Postural dysfunction, Impaired flexibility  Visit Diagnosis: 1. Chronic bilateral low back pain with left-sided sciatica   2. Chronic pain of right knee   3. Chronic pain of left knee   4. Muscle weakness (generalized)   5. Difficulty in walking, not elsewhere classified        Problem List Patient Active Problem List   Diagnosis Date Noted  . Bilateral hip pain 06/19/2018  . Pain of both hip joints 06/19/2018  . Chronic left-sided low back pain with left-sided sciatica 06/19/2018  . Elevated cholesterol 01/15/2018  . Arthritis of knee, left 07/02/2017  . Arthritis of right knee 01/29/2017  . Baker's cyst of knee, left 01/02/2017  . Perennial allergic rhinitis with a probable nonallergic component 12/12/2016  . Neuropathy 11/19/2016  . Chronic pain 11/19/2016  . Dysphonia 10/03/2016  .  Vocal cord atrophy 10/03/2016  . Moderate persistent asthma 07/10/2016  . Bronchitis 07/10/2016  . Panic attack 10/23/2015  . Anemia 10/22/2015  . Chemotherapy induced diarrhea 10/17/2015  . Osteoporosis 09/07/2015  . Generalized anxiety disorder 09/07/2015  . Osteoarthritis of  right knee 09/07/2015  . GERD (gastroesophageal reflux disease) 09/07/2015  . Depression 09/07/2015  . Acquired absence of left breast and nipple 06/30/2015  . Breast cancer of upper-inner quadrant of left female breast ([place]) 05/26/2015     Janene Harvey, PT, DPT 07/22/18 3:58 PM   [facility] [address]  [place], [place], [address] Phone: [phone]   Fax:  [phone]  Name: ISSABELA LESKO MRN: [id number] Date of Birth: February 02, 1955

## 2018-07-25 ENCOUNTER — Other Ambulatory Visit: Payer: Self-pay | Admitting: Physician Assistant

## 2018-07-29 ENCOUNTER — Ambulatory Visit: Payer: BC Managed Care – PPO | Admitting: Physical Therapy

## 2018-07-29 ENCOUNTER — Other Ambulatory Visit: Payer: Self-pay

## 2018-07-29 ENCOUNTER — Encounter: Payer: Self-pay | Admitting: Physical Therapy

## 2018-07-29 DIAGNOSIS — G8929 Other chronic pain: Secondary | ICD-10-CM | POA: Diagnosis not present

## 2018-07-29 DIAGNOSIS — M25562 Pain in left knee: Secondary | ICD-10-CM | POA: Diagnosis not present

## 2018-07-29 DIAGNOSIS — R262 Difficulty in walking, not elsewhere classified: Secondary | ICD-10-CM

## 2018-07-29 DIAGNOSIS — M6281 Muscle weakness (generalized): Secondary | ICD-10-CM | POA: Diagnosis not present

## 2018-07-29 DIAGNOSIS — M25561 Pain in right knee: Secondary | ICD-10-CM

## 2018-07-29 DIAGNOSIS — M5442 Lumbago with sciatica, left side: Secondary | ICD-10-CM | POA: Diagnosis not present

## 2018-07-29 NOTE — Therapy (Signed)
[facility] [address]  [place], [place], [address] Phone: [phone]   Fax:  [phone]  Physical Therapy Treatment  Patient Details  Name: Heidi Walls MRN: [id number] Date of Birth: 10/12/1955 Referring Provider (PT): Joni Fears, MD   Encounter Date: 07/29/2018  PT End of Session - 07/29/18 1152    Visit Number  8    Number of Visits  13    Date for PT Re-Evaluation  08/05/18    Authorization Type  BCBS; VL: 33    Authorization - Visit Number  8    Authorization - Number of Visits  30    PT Start Time  6389    PT Stop Time  1147    PT Time Calculation (min)  44 min    Activity Tolerance  Patient tolerated treatment well    Behavior During Therapy  [facility] for tasks assessed/performed       Past Medical History:  Diagnosis Date  . Anemia   . Anxiety    OCD  . Arthritis    knees and hips  . Asthma   . Breast cancer ([place])   . Breast cancer of upper-inner quadrant of left female breast ([place]) 05/26/2015  . GERD (gastroesophageal reflux disease)   . Hypertension   . Insomnia     Past Surgical History:  Procedure Laterality Date  . BREAST RECONSTRUCTION WITH PLACEMENT OF TISSUE EXPANDER AND FLEX HD (ACELLULAR HYDRATED DERMIS) Left 06/24/2015   Procedure: LEFT BREAST RECONSTRUCTION WITH PLACEMENT OF TISSUE EXPANDER AND  ACELLULAR HYDRATED DERMIS (CORTIVA);  Surgeon: Irene Limbo, MD;  Location: [facility];  Service: Plastics;  Laterality: Left;  . KNEE SURGERY Right 2000   arthroscopy  . LIPOSUCTION WITH LIPOFILLING Left 12/23/2015   Procedure: LIPOFILLING TO LEFT CHEST FROM ABDOMEN  ;  Surgeon: Irene Limbo, MD;  Location: [place];  Service: Plastics;  Laterality: Left;  Marland Kitchen MASTECTOMY W/ SENTINEL NODE BIOPSY Left 06/24/2015   Procedure: LEFT TOTAL MASTECTOMY WITH SENTINEL LYMPH NODE BIOPSY;  Surgeon: Excell Seltzer, MD;  Location: [place];   Service: General;  Laterality: Left;  Marland Kitchen MASTOPEXY Right 12/23/2015   Procedure: RIGHT BREAST MASTOPEXY;  Surgeon: Irene Limbo, MD;  Location: [facility];  Service: Plastics;  Laterality: Right;  . MICROLARYNGOSCOPY W/VOCAL CORD INJECTION N/A 10/15/2016   Procedure: SUSPENDED DIRECT MICROLARYNGOSCOPY WITH VOCAL CORD INJECTION WITH JET VENTILATION;  Surgeon: Melida Quitter, MD;  Location: [facility];  Service: ENT;  Laterality: N/A;  . PORTACATH PLACEMENT Right 06/24/2015   Procedure: INSERTION PORT-A-CATH;  Surgeon: Excell Seltzer, MD;  Location: [facility];  Service: General;  Laterality: Right;  . portacath removal  09/05/2016  . REMOVAL OF BILATERAL TISSUE EXPANDERS WITH PLACEMENT OF BILATERAL BREAST IMPLANTS Left 12/23/2015   Procedure: REMOVAL OF LEFT  TISSUE EXPANDERS WITH PLACEMENT OF LEFT  BREAST IMPLANTS;  Surgeon: Irene Limbo, MD;  Location: [facility];  Service: Plastics;  Laterality: Left;  . TONSILLECTOMY      There were no vitals filed for this visit.  Subjective Assessment - 07/29/18 1104    Subjective  Reports that she has been having more knee pain d/t helping to move things for a friend, requiring walking up/down the stairs a lot.    Pertinent History  HTN, osteoporosis, GERD, hx breast CA with B mastectomy 2017, asthma, anxiety, anemia, R knee arthroscopy 2000, L arm lymphedema  Diagnostic tests  06/19/18 lumbar xray: There is some narrowing of the L5-S1 disc space.  There are anterior osteophytes at L3-L4 and to a lesser extent canal appears to be patent.  Minimal sclerosis about the SI joints. Some facet sclerosis at L4-5 and particularly L5-S1. 06/19/18 pelvis xray:The hip joints appear to be well-maintained very minimal subchondral cysts more on the right than the left hip but quite subtle.    Patient Stated Goals  get rid of pain    Currently in Pain?  Yes    Pain Score  3     Pain Location  Back    Pain Orientation   Right;Left;Lower    Pain Descriptors / Indicators  Aching    Pain Type  Chronic pain    Pain Score  5    Pain Location  Knee    Pain Orientation  Right;Left    Pain Descriptors / Indicators  Aching    Pain Type  Chronic pain                       [facility] Adult PT Treatment/Exercise - 07/29/18 0001      Exercises   Exercises  Lumbar;Knee/Hip      Lumbar Exercises: Aerobic   Nustep  L4 x 6 min (LEs only)      Lumbar Exercises: Standing   Other Standing Lumbar Exercises  resisted trunk rotation with red TB x10 each LE   cues for control     Lumbar Exercises: Supine   Other Supine Lumbar Exercises  paloff press hooklying 10x with red TB      Knee/Hip Exercises: Stretches   Piriformis Stretch  Right;Left;30 seconds;1 rep    Piriformis Stretch Limitations  sitting KTOS    Other Knee/Hip Stretches  sitting figure 4 stretch B x30"       Knee/Hip Exercises: Standing   Other Standing Knee Exercises  [employer] the worlds with yellow TB around ankles at counter top x10 each LE   cues to correct trunk lean      Knee/Hip Exercises: Seated   Sit to Sand  1 set;without UE support;10 reps   yellow TB around knees; mini squat to mat     Knee/Hip Exercises: Sidelying   Clams  x10 each side with red TB above knees      [employer]   Create Space  B chondromalacia pattern with 50% stretch on longer strips, 80% stretch over small trip over patellar tendon    using sensitive skin variety            PT Education - 07/29/18 1152    Education Details  administered red TB for clamshells at home    Person(s) Educated  Patient    Methods  Explanation;Demonstration;Tactile cues;Verbal cues    Comprehension  Verbalized understanding;Returned demonstration       PT Short Term Goals - 07/04/18 1049      PT SHORT TERM GOAL #1   Title  Patient to be independent with initial HEP.    Time  3    Period  Weeks    Status  Achieved    Target Date  07/15/18        PT  Long Term Goals - 07/01/18 1204      PT LONG TERM GOAL #1   Title  Patient to be independent with advanced HEP.    Time  6    Period  Weeks    Status  On-going      PT LONG TERM GOAL #2   Title  Patient to demonstrate B LE strength >=4+/5.    Time  6    Period  Weeks    Status  On-going      PT LONG TERM GOAL #3   Title  Patient to demonstrate [facility] and pain-free lumbar AROM.    Time  6    Period  Weeks    Status  On-going      PT LONG TERM GOAL #4   Title  Patient to demonstrate B knee AROM WFL and without pain limiting.    Time  6    Period  Weeks    Status  On-going      PT LONG TERM GOAL #5   Title  Patient to report 0/10 pain in average with STS.    Time  6    Period  Weeks    Status  On-going      PT LONG TERM GOAL #6   Title  Patient to report 1 mile of walking before onset of pain.    Time  6    Period  Weeks    Status  On-going            Plan - 07/29/18 1152    Clinical Impression Statement  Patient arrive to session with report of increased B knee pain after increased walking up/down stairs over the weekend. Patient reporting limited HEP compliance d/t exercising in the pool. Explained to patient benefit of increased HEP compliance for max benefit. Patient tolerated progressive core and LE strengthening with intermittent cues to correct compensatory movements. Patient tolerated shallow mini squat without c/o knee pain and with good form. Able to perform clamshells with increased banded resistance. Ended session with sensitive skin KT tape to B knees as patient reporting dramatic pain relief with this modality. Advised patient to remove tape immediately if skin irritation occurs. Patrienr reported understanding. Plan for DN to glutes and piriformis next session- patient agreeable and reported understanding of edu sheet administered last session.    Comorbidities  HTN, osteoporosis, GERD, hx breast CA with B mastectomy 2017, asthma, anxiety, anemia, R knee  arthroscopy 2000, L arm lymphedema    PT Treatment/Interventions  ADLs/Self Care Home Management;Cryotherapy;Electrical Stimulation;Iontophoresis 4mg /ml Dexamethasone;Moist Heat;Ultrasound;Gait training;Stair training;Functional mobility training;Therapeutic activities;Therapeutic exercise;Balance training;Manual techniques;Orthotic Fit/Training;Patient/family education;Neuromuscular re-education;Passive range of motion;Dry needling;Energy conservation;Taping;Vasopneumatic Device    PT Next Visit Plan  quad & HS stretching, STM to B buttock, hip and core strengthening    Consulted and Agree with Plan of Care  Patient       Patient will benefit from skilled therapeutic intervention in order to improve the following deficits and impairments:  Hypomobility, Increased edema, Decreased activity tolerance, Decreased strength, Pain, Difficulty walking, Decreased mobility, Decreased balance, Decreased range of motion, Improper body mechanics, Postural dysfunction, Impaired flexibility  Visit Diagnosis: 1. Chronic bilateral low back pain with left-sided sciatica   2. Chronic pain of right knee   3. Chronic pain of left knee   4. Muscle weakness (generalized)   5. Difficulty in walking, not elsewhere classified        Problem List Patient Active Problem List   Diagnosis Date Noted  . Bilateral hip pain 06/19/2018  . Pain of both hip joints 06/19/2018  . Chronic left-sided low back pain with left-sided sciatica 06/19/2018  . Elevated cholesterol 01/15/2018  . Arthritis of knee, left 07/02/2017  . Arthritis of right knee 01/29/2017  .  Baker's cyst of knee, left 01/02/2017  . Perennial allergic rhinitis with a probable nonallergic component 12/12/2016  . Neuropathy 11/19/2016  . Chronic pain 11/19/2016  . Dysphonia 10/03/2016  . Vocal cord atrophy 10/03/2016  . Moderate persistent asthma 07/10/2016  . Bronchitis 07/10/2016  . Panic attack 10/23/2015  . Anemia 10/22/2015  . Chemotherapy  induced diarrhea 10/17/2015  . Osteoporosis 09/07/2015  . Generalized anxiety disorder 09/07/2015  . Osteoarthritis of right knee 09/07/2015  . GERD (gastroesophageal reflux disease) 09/07/2015  . Depression 09/07/2015  . Acquired absence of left breast and nipple 06/30/2015  . Breast cancer of upper-inner quadrant of left female breast ([place]) 05/26/2015    Janene Harvey, PT, DPT 07/29/18 11:58 AM    [facility] [address]  [place], [place], [address] Phone: [phone]   Fax:  [phone]  Name: HYDEE FLEECE MRN: [id number] Date of Birth: Nov 17, 1955

## 2018-08-01 ENCOUNTER — Encounter: Payer: Self-pay | Admitting: Physical Therapy

## 2018-08-01 ENCOUNTER — Other Ambulatory Visit: Payer: Self-pay

## 2018-08-01 ENCOUNTER — Ambulatory Visit: Payer: BC Managed Care – PPO | Admitting: Physical Therapy

## 2018-08-01 DIAGNOSIS — G8929 Other chronic pain: Secondary | ICD-10-CM | POA: Diagnosis not present

## 2018-08-01 DIAGNOSIS — M6281 Muscle weakness (generalized): Secondary | ICD-10-CM

## 2018-08-01 DIAGNOSIS — M25561 Pain in right knee: Secondary | ICD-10-CM | POA: Diagnosis not present

## 2018-08-01 DIAGNOSIS — R262 Difficulty in walking, not elsewhere classified: Secondary | ICD-10-CM | POA: Diagnosis not present

## 2018-08-01 DIAGNOSIS — M25562 Pain in left knee: Secondary | ICD-10-CM

## 2018-08-01 DIAGNOSIS — M5442 Lumbago with sciatica, left side: Secondary | ICD-10-CM | POA: Diagnosis not present

## 2018-08-01 NOTE — Therapy (Signed)
[facility] [address]  [place], [place], [address] Phone: [phone]   Fax:  [phone]  Physical Therapy Treatment  Patient Details  Name: Heidi Walls MRN: [id number] Date of Birth: 02-16-1955 Referring Provider (PT): Joni Fears, MD   Encounter Date: 08/01/2018  PT End of Session - 08/01/18 1156    Visit Number  9    Number of Visits  13    Date for PT Re-Evaluation  08/05/18    Authorization Type  BCBS; VL: 35    Authorization - Visit Number  9    Authorization - Number of Visits  30    PT Start Time  0932    PT Stop Time  1017    PT Time Calculation (min)  45 min    Activity Tolerance  Patient tolerated treatment well    Behavior During Therapy  [facility] for tasks assessed/performed       Past Medical History:  Diagnosis Date  . Anemia   . Anxiety    OCD  . Arthritis    knees and hips  . Asthma   . Breast cancer ([place])   . Breast cancer of upper-inner quadrant of left female breast ([place]) 05/26/2015  . GERD (gastroesophageal reflux disease)   . Hypertension   . Insomnia     Past Surgical History:  Procedure Laterality Date  . BREAST RECONSTRUCTION WITH PLACEMENT OF TISSUE EXPANDER AND FLEX HD (ACELLULAR HYDRATED DERMIS) Left 06/24/2015   Procedure: LEFT BREAST RECONSTRUCTION WITH PLACEMENT OF TISSUE EXPANDER AND  ACELLULAR HYDRATED DERMIS (CORTIVA);  Surgeon: Irene Limbo, MD;  Location: [place] [facility];  Service: Plastics;  Laterality: Left;  . KNEE SURGERY Right 2000   arthroscopy  . LIPOSUCTION WITH LIPOFILLING Left 12/23/2015   Procedure: LIPOFILLING TO LEFT CHEST FROM ABDOMEN  ;  Surgeon: Irene Limbo, MD;  Location: [facility];  Service: Plastics;  Laterality: Left;  Marland Kitchen MASTECTOMY W/ SENTINEL NODE BIOPSY Left 06/24/2015   Procedure: LEFT TOTAL MASTECTOMY WITH SENTINEL LYMPH NODE BIOPSY;  Surgeon: Excell Seltzer, MD;  Location: [facility];   Service: General;  Laterality: Left;  Marland Kitchen MASTOPEXY Right 12/23/2015   Procedure: RIGHT BREAST MASTOPEXY;  Surgeon: Irene Limbo, MD;  Location: [facility];  Service: Plastics;  Laterality: Right;  . MICROLARYNGOSCOPY W/VOCAL CORD INJECTION N/A 10/15/2016   Procedure: SUSPENDED DIRECT MICROLARYNGOSCOPY WITH VOCAL CORD INJECTION WITH JET VENTILATION;  Surgeon: Melida Quitter, MD;  Location: [facility];  Service: ENT;  Laterality: N/A;  . PORTACATH PLACEMENT Right 06/24/2015   Procedure: INSERTION PORT-A-CATH;  Surgeon: Excell Seltzer, MD;  Location: [facility];  Service: General;  Laterality: Right;  . portacath removal  09/05/2016  . REMOVAL OF BILATERAL TISSUE EXPANDERS WITH PLACEMENT OF BILATERAL BREAST IMPLANTS Left 12/23/2015   Procedure: REMOVAL OF LEFT  TISSUE EXPANDERS WITH PLACEMENT OF LEFT  BREAST IMPLANTS;  Surgeon: Irene Limbo, MD;  Location: [facility];  Service: Plastics;  Laterality: Left;  . TONSILLECTOMY      There were no vitals filed for this visit.  Subjective Assessment - 08/01/18 0934    Subjective  Reports that the sensitive skin tape did not stay on or work as good as the other one. Has been performing HEP. Did some twisting exercises in the pool and her L lateral hip/side is sore.    Pertinent History  HTN, osteoporosis, GERD, hx breast CA with B mastectomy 2017, asthma,  anxiety, anemia, R knee arthroscopy 2000, L arm lymphedema    Diagnostic tests  06/19/18 lumbar xray: There is some narrowing of the L5-S1 disc space.  There are anterior osteophytes at L3-L4 and to a lesser extent canal appears to be patent.  Minimal sclerosis about the SI joints. Some facet sclerosis at L4-5 and particularly L5-S1. 06/19/18 pelvis xray:The hip joints appear to be well-maintained very minimal subchondral cysts more on the right than the left hip but quite subtle.    Patient Stated Goals  get rid of pain    Currently in Pain?  Yes    Pain  Score  3     Pain Location  Back    Pain Orientation  Right;Left;Lower    Pain Descriptors / Indicators  Aching    Pain Type  Chronic pain    Pain Score  3    Pain Location  Buttocks    Pain Orientation  Left    Pain Descriptors / Indicators  Aching    Pain Type  Chronic pain    Pain Score  4    Pain Location  Knee    Pain Orientation  Right;Left    Pain Descriptors / Indicators  Aching    Pain Type  Chronic pain                       [facility] Adult PT Treatment/Exercise - 08/01/18 0001      Lumbar Exercises: Aerobic   Nustep  L4 x 6 min (LEs only)      Knee/Hip Exercises: Stretches   Piriformis Stretch  Right;Left;30 seconds;2 reps    Piriformis Stretch Limitations  supine KTOS    Other Knee/Hip Stretches  supine figure 4 stretch B 2x30"       Knee/Hip Exercises: Seated   Other Seated Knee/Hip Exercises  sitting hip IR with yellow TB and ball squeeze x10      Knee/Hip Exercises: Supine   Other Supine Knee/Hip Exercises  supine clam with yellow TB x10      Manual Therapy   Manual Therapy  Soft tissue mobilization;Myofascial release    Soft tissue mobilization  STM/DTM to L glutes, esp glute medius & minimus, and piriformis after DN - palpable reduction in muscle tension & ttp     Myofascial Release  manual TPR to L lateral glute medius/minimus & medial/lateral piriformis       Trigger Point Dry Needling - 08/01/18 0001    Consent Given?  Yes    Education Handout Provided  Yes    Muscles Treated Back/Hip  Gluteus minimus;Gluteus medius;Piriformis   Left   Gluteus Minimus Response  Twitch response elicited;Palpable increased muscle length    Gluteus Medius Response  Twitch response elicited;Palpable increased muscle length    Piriformis Response  Twitch response elicited;Palpable increased muscle length           PT Education - 08/01/18 1155    Education Details  edu on post-DN instructions    Person(s) Educated  Patient    Methods  Explanation     Comprehension  Verbalized understanding       PT Short Term Goals - 07/04/18 1049      PT SHORT TERM GOAL #1   Title  Patient to be independent with initial HEP.    Time  3    Period  Weeks    Status  Achieved    Target Date  07/15/18  PT Long Term Goals - 07/01/18 1204      PT LONG TERM GOAL #1   Title  Patient to be independent with advanced HEP.    Time  6    Period  Weeks    Status  On-going      PT LONG TERM GOAL #2   Title  Patient to demonstrate B LE strength >=4+/5.    Time  6    Period  Weeks    Status  On-going      PT LONG TERM GOAL #3   Title  Patient to demonstrate [facility] and pain-free lumbar AROM.    Time  6    Period  Weeks    Status  On-going      PT LONG TERM GOAL #4   Title  Patient to demonstrate B knee AROM WFL and without pain limiting.    Time  6    Period  Weeks    Status  On-going      PT LONG TERM GOAL #5   Title  Patient to report 0/10 pain in average with STS.    Time  6    Period  Weeks    Status  On-going      PT LONG TERM GOAL #6   Title  Patient to report 1 mile of walking before onset of pain.    Time  6    Period  Weeks    Status  On-going            Plan - 08/01/18 1156    Clinical Impression Statement  Patient reporting L lateral hip/flank pain after performing "twisting exercises" in the pool since last session. Patient still agreeable to DN today. Tolerated DN to L buttock well and with report of relief. Followed manual therapy with piriformis stretching- patient continued to report pain relief on L side. Also tolerated gentle hip rotation strengthening with light banded resistance with good ROM. Patient without complaints at end of session.    Comorbidities  HTN, osteoporosis, GERD, hx breast CA with B mastectomy 2017, asthma, anxiety, anemia, R knee arthroscopy 2000, L arm lymphedema    PT Treatment/Interventions  ADLs/Self Care Home Management;Cryotherapy;Electrical Stimulation;Iontophoresis 4mg /ml  Dexamethasone;Moist Heat;Ultrasound;Gait training;Stair training;Functional mobility training;Therapeutic activities;Therapeutic exercise;Balance training;Manual techniques;Orthotic Fit/Training;Patient/family education;Neuromuscular re-education;Passive range of motion;Dry needling;Energy conservation;Taping;Vasopneumatic Device    PT Next Visit Plan  quad & HS stretching, STM to B buttock, hip and core strengthening    Consulted and Agree with Plan of Care  Patient       Patient will benefit from skilled therapeutic intervention in order to improve the following deficits and impairments:  Hypomobility, Increased edema, Decreased activity tolerance, Decreased strength, Pain, Difficulty walking, Decreased mobility, Decreased balance, Decreased range of motion, Improper body mechanics, Postural dysfunction, Impaired flexibility  Visit Diagnosis: 1. Chronic bilateral low back pain with left-sided sciatica   2. Chronic pain of right knee   3. Chronic pain of left knee   4. Muscle weakness (generalized)   5. Difficulty in walking, not elsewhere classified        Problem List Patient Active Problem List   Diagnosis Date Noted  . Bilateral hip pain 06/19/2018  . Pain of both hip joints 06/19/2018  . Chronic left-sided low back pain with left-sided sciatica 06/19/2018  . Elevated cholesterol 01/15/2018  . Arthritis of knee, left 07/02/2017  . Arthritis of right knee 01/29/2017  . Baker's cyst of knee, left 01/02/2017  . Perennial allergic rhinitis with a  probable nonallergic component 12/12/2016  . Neuropathy 11/19/2016  . Chronic pain 11/19/2016  . Dysphonia 10/03/2016  . Vocal cord atrophy 10/03/2016  . Moderate persistent asthma 07/10/2016  . Bronchitis 07/10/2016  . Panic attack 10/23/2015  . Anemia 10/22/2015  . Chemotherapy induced diarrhea 10/17/2015  . Osteoporosis 09/07/2015  . Generalized anxiety disorder 09/07/2015  . Osteoarthritis of right knee 09/07/2015  . GERD  (gastroesophageal reflux disease) 09/07/2015  . Depression 09/07/2015  . Acquired absence of left breast and nipple 06/30/2015  . Breast cancer of upper-inner quadrant of left female breast ([employer]) 05/26/2015     Heidi Walls, PT, DPT 08/01/18 11:59 AM   [facility] [address]  [place], [place], [address] Phone: [phone]   Fax:  [phone]  Name: Heidi Walls MRN: [id number] Date of Birth: 1955-10-11

## 2018-08-05 ENCOUNTER — Encounter: Payer: Self-pay | Admitting: Physical Therapy

## 2018-08-05 ENCOUNTER — Ambulatory Visit: Payer: BC Managed Care – PPO | Admitting: Physical Therapy

## 2018-08-05 ENCOUNTER — Other Ambulatory Visit: Payer: Self-pay

## 2018-08-05 DIAGNOSIS — R262 Difficulty in walking, not elsewhere classified: Secondary | ICD-10-CM

## 2018-08-05 DIAGNOSIS — M25561 Pain in right knee: Secondary | ICD-10-CM | POA: Diagnosis not present

## 2018-08-05 DIAGNOSIS — M6281 Muscle weakness (generalized): Secondary | ICD-10-CM

## 2018-08-05 DIAGNOSIS — G8929 Other chronic pain: Secondary | ICD-10-CM | POA: Diagnosis not present

## 2018-08-05 DIAGNOSIS — M25562 Pain in left knee: Secondary | ICD-10-CM | POA: Diagnosis not present

## 2018-08-05 DIAGNOSIS — M5442 Lumbago with sciatica, left side: Secondary | ICD-10-CM | POA: Diagnosis not present

## 2018-08-05 NOTE — Therapy (Addendum)
[facility] [address]  [place], [place], [address] Phone: [phone]   Fax:  [phone]  Physical Therapy Treatment  Patient Details  Name: Heidi Walls MRN: [id number] Date of Birth: [birth year] Referring Provider (PT): Joni Fears, MD   Encounter Date: 08/05/2018  PT End of Session - 08/05/18 1623    Visit Number  10    Number of Visits  13    Date for PT Re-Evaluation  08/05/18    Authorization Type  BC[employer]; VL: 28    Authorization - Visit Number  10    Authorization - Number of Visits  30    PT Start Time  6568    PT Stop Time  1444    PT Time Calculation (min)  42 min    Activity Tolerance  Patient tolerated treatment well    Behavior During Therapy  [facility] for tasks assessed/performed       Past Medical History:  Diagnosis Date  . Anemia   . Anxiety    OCD  . Arthritis    knees and hips  . Asthma   . Breast cancer ([place])   . Breast cancer of upper-inner quadrant of left female breast ([place]) 05/26/2015  . GERD (gastroesophageal reflux disease)   . Hypertension   . Insomnia     Past Surgical History:  Procedure Laterality Date  . BREAST RECONSTRUCTION WITH PLACEMENT OF TISSUE EXPANDER AND FLEX HD (ACELLULAR HYDRATED DERMIS) Left 06/24/2015   Procedure: LEFT BREAST RECONSTRUCTION WITH PLACEMENT OF TISSUE EXPANDER AND  ACELLULAR HYDRATED DERMIS (CORTIVA);  Surgeon: Irene Limbo, MD;  Location: [place];  Service: Plastics;  Laterality: Left;  . KNEE SURGERY Right 2000   arthroscopy  . LIPOSUCTION WITH LIPOFILLING Left 12/23/2015   Procedure: LIPOFILLING TO LEFT CHEST FROM ABDOMEN  ;  Surgeon: Irene Limbo, MD;  Location: [place];  Service: Plastics;  Laterality: Left;  Marland Kitchen MASTECTOMY W/ SENTINEL NODE BIOPSY Left 06/24/2015   Procedure: LEFT TOTAL MASTECTOMY WITH SENTINEL LYMPH NODE BIOPSY;  Surgeon: Excell Seltzer, MD;  Location: [place];  Service: General;  Laterality: Left;  Marland Kitchen MASTOPEXY Right 12/23/2015   Procedure: RIGHT BREAST MASTOPEXY;  Surgeon: Irene Limbo, MD;  Location: [place];  Service: Plastics;  Laterality: Right;  . MICROLARYNGOSCOPY W/VOCAL CORD INJECTION N/A 10/15/2016   Procedure: SUSPENDED DIRECT MICROLARYNGOSCOPY WITH VOCAL CORD INJECTION WITH JET VENTILATION;  Surgeon: Melida Quitter, MD;  Location: [place];  Service: ENT;  Laterality: N/A;  . PORTACATH PLACEMENT Right 06/24/2015   Procedure: INSERTION PORT-A-CATH;  Surgeon: Excell Seltzer, MD;  Location: [place];  Service: General;  Laterality: Right;  . portacath removal  09/05/2016  . REMOVAL OF BILATERAL TISSUE EXPANDERS WITH PLACEMENT OF BILATERAL BREAST IMPLANTS Left 12/23/2015   Procedure: REMOVAL OF LEFT  TISSUE EXPANDERS WITH PLACEMENT OF LEFT  BREAST IMPLANTS;  Surgeon: Irene Limbo, MD;  Location: [place];  Service: Plastics;  Laterality: Left;  . TONSILLECTOMY      There were no vitals filed for this visit.  Subjective Assessment - 08/05/18 1404    Subjective  Reports "remarkable" progress with DN. Has been swimming consistenlty and believes this is also helping. Reports 50-75% improvement since initial eval.    Pertinent History  HTN, osteoporosis, GERD, hx breast CA with B mastectomy 2017, asthma, anxiety, anemia, R knee arthroscopy 2000, L arm lymphedema    Diagnostic tests  06/19/18 lumbar xray: There is some narrowing of the L5-S1 disc space.  There are anterior osteophytes at L3-L4 and to a lesser extent canal appears to be patent.  Minimal sclerosis about the SI joints. Some facet sclerosis at L4-5 and particularly L5-S1. 06/19/18 pelvis xray:The hip joints appear to be well-maintained very minimal subchondral cysts more on the right than the left hip but quite subtle.    Patient Stated Goals  get rid of pain    Currently in Pain?  Yes    Pain Score  3     Pain Location   Back    Pain Orientation  Right;Left;Lower    Pain Descriptors / Indicators  Aching    Pain Type  Chronic pain    Pain Score  3    Pain Location  Buttocks    Pain Orientation  Left    Pain Descriptors / Indicators  Aching    Pain Type  Chronic pain    Pain Score  3    Pain Location  Knee    Pain Orientation  Left    Pain Descriptors / Indicators  Aching    Pain Type  Chronic pain         [facility] PT Assessment - 08/05/18 0001      Observation/Other Assessments   Focus on Therapeutic Outcomes (FOTO)   Lumbar: 57 (43% limited, 48% predicted)      AROM   Right Knee Extension  1    Right Knee Flexion  132    Left Knee Extension  2    Left Knee Flexion  130    Lumbar Flexion  toes   mild L buttock pain   Lumbar Extension  WFL   mild pain in LB   Lumbar - Right Side Bend  jt line   moderate LBP   Lumbar - Left Side Bend  jt line    moderate LBP   Lumbar - Right Rotation  mildly limited   moderate LBP   Lumbar - Left Rotation  moderately limited   moderate LBP     Strength   Right Hip Flexion  4+/5    Right Hip ABduction  4+/5    Right Hip ADduction  4+/5    Left Hip Flexion  4+/5    Left Hip ABduction  4+/5    Left Hip ADduction  4+/5    Right Knee Flexion  4+/5    Right Knee Extension  4+/5    Left Knee Flexion  4+/5    Left Knee Extension  4+/5    Right Ankle Dorsiflexion  5/5    Right Ankle Plantar Flexion  4+/5    Left Ankle Dorsiflexion  4+/5    Left Ankle Plantar Flexion  4+/5                   [facility] Adult PT Treatment/Exercise - 08/05/18 0001      Lumbar Exercises: Aerobic   Recumbent Bike  L1 x 6 min             PT Education - 08/05/18 1623    Education Details  update/consolidation of HEP, discussion on objective progress with PT    Person(s) Educated  Patient    Methods  Explanation;Demonstration;Tactile cues;Verbal cues;Handout    Comprehension  Verbalized understanding;Returned demonstration       PT Short Term Goals -  08/05/18 1413      PT SHORT TERM GOAL #1   Title  Patient  to be independent with initial HEP.    Time  3    Period  Weeks    Status  Achieved    Target Date  07/15/18        PT Long Term Goals - 08/05/18 1413      PT LONG TERM GOAL #1   Title  Patient to be independent with advanced HEP.    Time  6    Period  Weeks    Status  Achieved      PT LONG TERM GOAL #2   Title  Patient to demonstrate B LE strength >=4+/5.    Time  6    Period  Weeks    Status  Achieved      PT LONG TERM GOAL #3   Title  Patient to demonstrate [facility] and pain-free lumbar AROM.    Time  6    Period  Weeks    Status  Partially Met   still with pain with certain movements, but improved. Demonstrating improvement in flexion, extension, B side bending, and R rotation     PT LONG TERM GOAL #4   Title  Patient to demonstrate B knee AROM WFL and without pain limiting.    Time  6    Period  Weeks    Status  Achieved      PT LONG TERM GOAL #5   Title  Patient to report 0/10 pain in average with STS.    Time  6    Period  Weeks    Status  Partially Met   6/10 pain in B knees and 4/10 in LB     PT LONG TERM GOAL #6   Title  Patient to report 1 mile of walking before onset of pain.    Time  6    Period  Weeks    Status  Partially Met   reporting 0.75 of a mile before onset of pain in L buttock           Plan - 08/05/18 1631    Clinical Impression Statement  Patient arrived to session with report of "remarkable" progress after DN last session. Reports 50-75% improvement in symptoms since initial visit, with the combination of PT and swimming contributing to her progress. Patient has shown progress towards all goals at this time. Demonstrating improvement in lumbar flexion, extension, B side bending, and R rotation. Patient still reporting pain with certain planes of movement, but improved. Strength testing revealed all muscle groups at least scoring 4+/5. Patient has also demonstrated incredible  improvements in B knee AROM, now also completely pain-free with testing. Patient still reporting 6/10 pain in B knees and 4/10 in LB with STS, but reports that this is improved from before. Now able to walk a  of a mile before onset of pain in L buttock. Reviewed and consolidated HEP with patient for continued benefit- patient reported understanding. Patient placed on 30 day hold with PT at this time d/t satisfaction with current progress.    Comorbidities  HTN, osteoporosis, GERD, hx breast CA with B mastectomy 2017, asthma, anxiety, anemia, R knee arthroscopy 2000, L arm lymphedema    PT Treatment/Interventions  ADLs/Self Care Home Management;Cryotherapy;Electrical Stimulation;Iontophoresis 44m/ml Dexamethasone;Moist Heat;Ultrasound;Gait training;Stair training;Functional mobility training;Therapeutic activities;Therapeutic exercise;Balance training;Manual techniques;Orthotic Fit/Training;Patient/family education;Neuromuscular re-education;Passive range of motion;Dry needling;Energy conservation;Taping;Vasopneumatic Device    PT Next Visit Plan  30 day hold at this time    Consulted and Agree with Plan of Care  Patient  Patient will benefit from skilled therapeutic intervention in order to improve the following deficits and impairments:  Hypomobility, Increased edema, Decreased activity tolerance, Decreased strength, Pain, Difficulty walking, Decreased mobility, Decreased balance, Decreased range of motion, Improper body mechanics, Postural dysfunction, Impaired flexibility  Visit Diagnosis: 1. Chronic bilateral low back pain with left-sided sciatica   2. Chronic pain of right knee   3. Chronic pain of left knee   4. Muscle weakness (generalized)   5. Difficulty in walking, not elsewhere classified        Problem List Patient Active Problem List   Diagnosis Date Noted  . Bilateral hip pain 06/19/2018  . Pain of both hip joints 06/19/2018  . Chronic left-sided low back pain with  left-sided sciatica 06/19/2018  . Elevated cholesterol 01/15/2018  . Arthritis of knee, left 07/02/2017  . Arthritis of right knee 01/29/2017  . Baker's cyst of knee, left 01/02/2017  . Perennial allergic rhinitis with a probable nonallergic component 12/12/2016  . Neuropathy 11/19/2016  . Chronic pain 11/19/2016  . Dysphonia 10/03/2016  . Vocal cord atrophy 10/03/2016  . Moderate persistent asthma 07/10/2016  . Bronchitis 07/10/2016  . Panic attack 10/23/2015  . Anemia 10/22/2015  . Chemotherapy induced diarrhea 10/17/2015  . Osteoporosis 09/07/2015  . Generalized anxiety disorder 09/07/2015  . Osteoarthritis of right knee 09/07/2015  . GERD (gastroesophageal reflux disease) 09/07/2015  . Depression 09/07/2015  . Acquired absence of left breast and nipple 06/30/2015  . Breast cancer of upper-inner quadrant of left female breast ([place]) 05/26/2015     Janene Harvey, PT, DPT 08/05/18 4:33 PM   [facility] [address]  [place], [place], [address] Phone: [phone]   Fax:  [phone]  Name: ZHANNA MELIN MRN: [id number] Date of Birth: [birth year]  PHYSICAL THERAPY DISCHARGE SUMMARY  Visits from Start of Care: 10  Current functional level related to goals / functional outcomes: See above clinical impression; patient did not return after being placed on 30 day hold    Remaining deficits: Pain with lumbar AROM and sit to stand, limited walking tolerance    Education / Equipment: HEP  Plan: Patient agrees to discharge.  Patient goals were partially met. Patient is being discharged due to being pleased with the current functional level.  ?????     Janene Harvey, PT, DPT 09/17/18 11:21 AM

## 2018-08-08 ENCOUNTER — Other Ambulatory Visit: Payer: Self-pay

## 2018-08-08 ENCOUNTER — Ambulatory Visit: Payer: BC Managed Care – PPO | Admitting: Podiatry

## 2018-08-08 DIAGNOSIS — S92321K Displaced fracture of second metatarsal bone, right foot, subsequent encounter for fracture with nonunion: Secondary | ICD-10-CM | POA: Diagnosis not present

## 2018-08-08 DIAGNOSIS — G5761 Lesion of plantar nerve, right lower limb: Secondary | ICD-10-CM

## 2018-08-08 NOTE — Progress Notes (Signed)
Subjective:  Patient ID: Heidi Walls, female    DOB: March 11, 1955,  MRN: [id number]  Chief Complaint  Patient presents with  . Neuroma    right 3 week follow up    63 y.o. female presents with the above complaint.  History as above. Received bone stimulator but hasn't started using it.  Review of Systems: Negative except as noted in the HPI. Denies N/V/F/Ch.  Past Medical History:  Diagnosis Date  . Anemia   . Anxiety    OCD  . Arthritis    knees and hips  . Asthma   . Breast cancer ([place])   . Breast cancer of upper-inner quadrant of left female breast ([place]) 05/26/2015  . GERD (gastroesophageal reflux disease)   . Hypertension   . Insomnia     Current Outpatient Medications:  .  acetaminophen (TYLENOL) 325 MG tablet, Take 650 mg by mouth daily., Disp: , Rfl:  .  albuterol (PROAIR HFA) 108 (90 Base) MCG/ACT inhaler, Inhale 2 puffs into the lungs every 4 (four) hours as needed for wheezing or shortness of breath., Disp: 3 Inhaler, Rfl: 3 .  alendronate (FOSAMAX) 70 MG tablet, Take 1 tablet (70 mg total) by mouth every 7 (seven) days. Take with a full glass of water on an empty stomach., Disp: 12 tablet, Rfl: 3 .  ALPRAZolam (XANAX) 0.5 MG tablet, Take 1 tablet (0.5 mg total) by mouth 3 (three) times daily as needed., Disp: 90 tablet, Rfl: 5 .  amoxicillin (AMOXIL) 500 MG tablet, Take 500 mg by mouth 3 (three) times daily., Disp: , Rfl:  .  amoxicillin-clavulanate (AUGMENTIN) 875-125 MG tablet, Take 1 tablet by mouth 2 (two) times daily., Disp: 28 tablet, Rfl: 0 .  Azelastine HCl 0.15 % SOLN, One spray each nostril 1-2 times a day as needed, Disp: 30 mL, Rfl: 5 .  B Complex-C (SUPER B COMPLEX PO), Take 1 tablet by mouth daily., Disp: , Rfl:  .  budesonide-formoterol (SYMBICORT) 160-4.5 MCG/ACT inhaler, 2 puffs twice daily with spacer device to prevent coughing or wheezing., Disp: 3 Inhaler, Rfl: 3 .  Calcium Carb-Cholecalciferol (CALCIUM 600 + D PO), Take 1 tablet by mouth daily.,  Disp: , Rfl:  .  Carbinoxamine Maleate 4 MG TABS, TAKE 1 TABLET EVERY 8 HOURS AS NEEDED FOR RUNNY NOSE OR ITCHING., Disp: 120 tablet, Rfl: 0 .  clindamycin (CLEOCIN) 150 MG capsule, TAKE ONE CAPSULE 4 TIMES DAILY UNTIL COMPLETE., Disp: , Rfl:  .  diclofenac sodium (VOLTAREN) 1 % GEL, Apply 4 g topically 4 (four) times daily., Disp: 400 g, Rfl: 11 .  DULoxetine (CYMBALTA) 60 MG capsule, Take 1 capsule (60 mg total) by mouth daily., Disp: 90 capsule, Rfl: 3 .  esomeprazole (NEXIUM) 20 MG capsule, Take 1 capsule (20 mg total) by mouth daily., Disp: 90 capsule, Rfl: 3 .  exemestane (AROMASIN) 25 MG tablet, Take 1 tablet (25 mg total) by mouth daily after breakfast., Disp: 90 tablet, Rfl: 3 .  fluconazole (DIFLUCAN) 150 MG tablet, TAKE 1 TABLET BY MOUTH EVERY WEEK FOR 4 WEEKS, Disp: , Rfl:  .  fluconazole (DIFLUCAN) 200 MG tablet, 1 po q week x 4 weeks, Disp: 12 tablet, Rfl: 3 .  FOLIC ACID PO, Take 1 tablet by mouth daily., Disp: , Rfl:  .  gabapentin (NEURONTIN) 600 MG tablet, TAKE 1 TABLET BY MOUTH THREE TIMES A DAY, Disp: 270 tablet, Rfl: 1 .  letrozole (FEMARA) 2.5 MG tablet, Take 1 tablet (2.5 mg total) by mouth daily.,  Disp: 90 tablet, Rfl: 3 .  montelukast (SINGULAIR) 10 MG tablet, Take 1 tablet (10 mg total) by mouth at bedtime., Disp: 90 tablet, Rfl: 3 .  Multiple Vitamin (MULTIVITAMIN) tablet, Take 1 tablet by mouth daily., Disp: , Rfl:  .  olmesartan-hydrochlorothiazide (BENICAR HCT) 20-12.5 MG tablet, Take 1 tablet by mouth daily., Disp: 90 tablet, Rfl: 3 .  olopatadine (PATANOL) 0.1 % ophthalmic solution, Place 1 drop into both eyes 2 (two) times daily., Disp: 15 mL, Rfl: 3 .  Omega-3 Fatty Acids (FISH OIL) 1200 MG CAPS, Take 1,200 mg by mouth daily., Disp: , Rfl:  .  Tiotropium Bromide Monohydrate (SPIRIVA RESPIMAT) 1.25 MCG/ACT AERS, Inhale 2 puffs into the lungs daily. (Patient not taking: Reported on 06/24/2018), Disp: 4 g, Rfl: 5 .  traMADol (ULTRAM) 50 MG tablet, Take 1 tablet (50 mg  total) by mouth every 6 (six) hours as needed for severe pain., Disp: 120 tablet, Rfl: 5 .  valACYclovir (VALTREX) 1000 MG tablet, Take 1,000 mg by mouth as needed. , Disp: , Rfl: 6 .  valACYclovir (VALTREX) 500 MG tablet, Take 500 mg by mouth 2 (two) times daily., Disp: , Rfl:   Social History   Tobacco Use  Smoking Status Former Smoker  . Packs/day: 1.00  . Years: 25.00  . Pack years: 25.00  . Types: Cigarettes  . Quit date: 10/09/2005  . Years since quitting: 12.8  Smokeless Tobacco Never Used    Allergies  Allergen Reactions  . Codeine Shortness Of Breath    Pt reports this was 30 years ago.   . Nsaids   . Lisinopril Cough  . Sulfamethoxazole    Objective:   There were no vitals filed for this visit. There is no height or weight on file to calculate BMI. Constitutional Well developed. Well nourished.  Vascular Dorsalis pedis pulses palpable bilaterally. Posterior tibial pulses palpable bilaterally. Capillary refill normal to all digits.  No cyanosis or clubbing noted. Pedal hair growth normal.  Neurologic Normal speech. Oriented to person, place, and time. Epicritic sensation to light touch grossly present bilaterally.  Dermatologic Nails well groomed and normal in appearance. No open wounds. No skin lesions.  Orthopedic: Point tenderness right 2nd metatarsal base. POP Right 3rd interspace   Radiographs: none today Assessment:   1. Displaced fracture of second metatarsal bone, right foot, subsequent encounter for fracture with nonunion   2. Morton neuroma, right    Plan:  Patient was evaluated and treated and all questions answered.  Right fracture of metatarsal base, right fourth metatarsal neck -Minimal pain today. -Advised to start bone stim.  Interdigital Neuroma, right -Repeat injection as below  Procedure: Neuroma Injection Location: Right 3rd interspace Skin Prep: Alcohol. Injectate: 0.5 cc 0.5% marcaine plain, 0.5 cc dexamethasone phosphate.  Disposition: Patient tolerated procedure well. Injection site dressed with a band-aid.   Return in about 6 weeks (around 09/19/2018) for Fracture non-union f/u, neuroma.

## 2018-08-18 ENCOUNTER — Ambulatory Visit (INDEPENDENT_AMBULATORY_CARE_PROVIDER_SITE_OTHER): Payer: BC Managed Care – PPO | Admitting: Physician Assistant

## 2018-08-18 DIAGNOSIS — G629 Polyneuropathy, unspecified: Secondary | ICD-10-CM | POA: Diagnosis not present

## 2018-08-18 DIAGNOSIS — M8580 Other specified disorders of bone density and structure, unspecified site: Secondary | ICD-10-CM | POA: Diagnosis not present

## 2018-08-19 ENCOUNTER — Encounter: Payer: Self-pay | Admitting: Physician Assistant

## 2018-08-19 ENCOUNTER — Other Ambulatory Visit: Payer: Self-pay | Admitting: Family Medicine

## 2018-08-19 ENCOUNTER — Other Ambulatory Visit: Payer: Self-pay | Admitting: Physician Assistant

## 2018-08-19 DIAGNOSIS — F411 Generalized anxiety disorder: Secondary | ICD-10-CM

## 2018-08-19 DIAGNOSIS — B354 Tinea corporis: Secondary | ICD-10-CM

## 2018-08-19 NOTE — Progress Notes (Signed)
Telephone visit  Subjective: TI:RWER scan, neuropathy PCP: Terald Sleeper, PA-C [id number] Heidi Walls is a 63 y.o. female calls for telephone consult today. Patient provides verbal consent for consult held via phone.  Patient is identified with 2 separate identifiers.  At this time the entire area is on COVID-19 social distancing and stay home orders are in place.  Patient is of higher risk and therefore we are performing this by a virtual method.  Location of patient: home Location of provider: [facility] Others present for call: no  This patient is having a follow-up on some chronic medical conditions.  Her most recent DEXA does show her bone to be in osteopenic phase and not osteoporosis.  We are going to stop the Fosamax for now.  It is giving her GI upset.  She does not really qualify for moving onto Prolia because she has not had any fractures.  We will continue to watch her DEXA she is to try to keep doing weightbearing exercise and taking her calcium and vitamin D.  She has seen her oncologist lately and everything is good and stable.  She does continue however with chronic neuropathy secondary to her medication.  All her other medications are good and stable now.  We will continue them and plan to recheck her in the next 3 to 6 months.   ROS: Per HPI  Allergies  Allergen Reactions  . Codeine Shortness Of Breath    Pt reports this was 30 years ago.   . Nsaids   . Lisinopril Cough  . Sulfamethoxazole    Past Medical History:  Diagnosis Date  . Anemia   . Anxiety    OCD  . Arthritis    knees and hips  . Asthma   . Breast cancer ([place])   . Breast cancer of upper-inner quadrant of left female breast ([place]) 05/26/2015  . GERD (gastroesophageal reflux disease)   . Hypertension   . Insomnia     Current Outpatient Medications:  .  acetaminophen (TYLENOL) 325 MG tablet, Take 650 mg by mouth daily., Disp: , Rfl:  .  albuterol (PROAIR HFA) 108 (90 Base) MCG/ACT inhaler, Inhale  2 puffs into the lungs every 4 (four) hours as needed for wheezing or shortness of breath., Disp: 3 Inhaler, Rfl: 3 .  alendronate (FOSAMAX) 70 MG tablet, Take 1 tablet (70 mg total) by mouth every 7 (seven) days. Take with a full glass of water on an empty stomach., Disp: 12 tablet, Rfl: 3 .  ALPRAZolam (XANAX) 0.5 MG tablet, Take 1 tablet (0.5 mg total) by mouth 3 (three) times daily as needed., Disp: 90 tablet, Rfl: 5 .  Azelastine HCl 0.15 % SOLN, One spray each nostril 1-2 times a day as needed, Disp: 30 mL, Rfl: 5 .  B Complex-C (SUPER B COMPLEX PO), Take 1 tablet by mouth daily., Disp: , Rfl:  .  budesonide-formoterol (SYMBICORT) 160-4.5 MCG/ACT inhaler, 2 puffs twice daily with spacer device to prevent coughing or wheezing., Disp: 3 Inhaler, Rfl: 3 .  Calcium Carb-Cholecalciferol (CALCIUM 600 + D PO), Take 1 tablet by mouth daily., Disp: , Rfl:  .  Carbinoxamine Maleate 4 MG TABS, TAKE 1 TABLET EVERY 8 HOURS AS NEEDED FOR RUNNY NOSE OR ITCHING., Disp: 120 tablet, Rfl: 0 .  diclofenac sodium (VOLTAREN) 1 % GEL, Apply 4 g topically 4 (four) times daily., Disp: 400 g, Rfl: 11 .  DULoxetine (CYMBALTA) 60 MG capsule, Take 1 capsule (60 mg total) by  mouth daily., Disp: 90 capsule, Rfl: 3 .  esomeprazole (NEXIUM) 20 MG capsule, Take 1 capsule (20 mg total) by mouth daily., Disp: 90 capsule, Rfl: 3 .  exemestane (AROMASIN) 25 MG tablet, Take 1 tablet (25 mg total) by mouth daily after breakfast., Disp: 90 tablet, Rfl: 3 .  fluconazole (DIFLUCAN) 150 MG tablet, TAKE 1 TABLET BY MOUTH EVERY WEEK FOR 4 WEEKS, Disp: , Rfl:  .  fluconazole (DIFLUCAN) 200 MG tablet, 1 po q week x 4 weeks, Disp: 12 tablet, Rfl: 3 .  FOLIC ACID PO, Take 1 tablet by mouth daily., Disp: , Rfl:  .  gabapentin (NEURONTIN) 600 MG tablet, TAKE 1 TABLET BY MOUTH THREE TIMES A DAY, Disp: 270 tablet, Rfl: 1 .  letrozole (FEMARA) 2.5 MG tablet, Take 1 tablet (2.5 mg total) by mouth daily., Disp: 90 tablet, Rfl: 3 .  montelukast  (SINGULAIR) 10 MG tablet, Take 1 tablet (10 mg total) by mouth at bedtime., Disp: 90 tablet, Rfl: 3 .  Multiple Vitamin (MULTIVITAMIN) tablet, Take 1 tablet by mouth daily., Disp: , Rfl:  .  olmesartan-hydrochlorothiazide (BENICAR HCT) 20-12.5 MG tablet, Take 1 tablet by mouth daily., Disp: 90 tablet, Rfl: 3 .  olopatadine (PATANOL) 0.1 % ophthalmic solution, Place 1 drop into both eyes 2 (two) times daily., Disp: 15 mL, Rfl: 3 .  Omega-3 Fatty Acids (FISH OIL) 1200 MG CAPS, Take 1,200 mg by mouth daily., Disp: , Rfl:  .  Tiotropium Bromide Monohydrate (SPIRIVA RESPIMAT) 1.25 MCG/ACT AERS, Inhale 2 puffs into the lungs daily. (Patient not taking: Reported on 06/24/2018), Disp: 4 g, Rfl: 5 .  traMADol (ULTRAM) 50 MG tablet, Take 1 tablet (50 mg total) by mouth every 6 (six) hours as needed for severe pain., Disp: 120 tablet, Rfl: 5 .  valACYclovir (VALTREX) 1000 MG tablet, Take 1,000 mg by mouth as needed. , Disp: , Rfl: 6 .  valACYclovir (VALTREX) 500 MG tablet, Take 500 mg by mouth 2 (two) times daily., Disp: , Rfl:   Assessment/ Plan: 63 y.o. female   1. Osteopenia determined by x-ray Monitor DEXA 2 years Continue vitamin D and calcium  2. Neuropathy Continue gabapentin at this time Follow-up as needed   No follow-ups on file.  Continue all other maintenance medications as listed above.  Start time: 5:15 PM End time: 5:27 PM  No orders of the defined types were placed in this encounter.   Particia Nearing PA-C [facility] [phone]

## 2018-08-20 ENCOUNTER — Other Ambulatory Visit: Payer: Self-pay | Admitting: Physician Assistant

## 2018-08-20 ENCOUNTER — Other Ambulatory Visit: Payer: Self-pay | Admitting: *Deleted

## 2018-08-20 DIAGNOSIS — G629 Polyneuropathy, unspecified: Secondary | ICD-10-CM

## 2018-08-20 DIAGNOSIS — G893 Neoplasm related pain (acute) (chronic): Secondary | ICD-10-CM

## 2018-08-20 MED ORDER — GABAPENTIN 600 MG PO TABS: 600.0000 mg | ORAL_TABLET | Freq: Three times a day (TID) | ORAL | 0 refills | Status: DC

## 2018-08-20 MED ORDER — ESOMEPRAZOLE MAGNESIUM 20 MG PO CPDR: 20.0000 mg | DELAYED_RELEASE_CAPSULE | Freq: Every day | ORAL | 0 refills | Status: AC

## 2018-08-20 NOTE — Telephone Encounter (Signed)
What is the name of the medication? Alprazolam 0.5 mg and Ketoconazole cream. RX was denied and was seen on 8-11  Have you contacted your pharmacy to request a refill? NO  Which pharmacy would you like this sent to? [facility] [address] in [facility]   Patient notified that their request is being sent to the clinical staff for review and that they should receive a call once it is complete. If they do not receive a call within 24 hours they can check with their pharmacy or our office.

## 2018-08-20 NOTE — Telephone Encounter (Signed)
The alprazolam was refilled on May 2020, with 5 refills.  We should not have to approve that one.

## 2018-08-20 NOTE — Telephone Encounter (Signed)
I denied RF. 08/18/18 no dx to support Alprazolam RF Ketoconazole not on current med list

## 2018-08-21 ENCOUNTER — Other Ambulatory Visit: Payer: Self-pay | Admitting: Allergy and Immunology

## 2018-08-21 ENCOUNTER — Other Ambulatory Visit: Payer: Self-pay | Admitting: Physician Assistant

## 2018-08-21 DIAGNOSIS — F411 Generalized anxiety disorder: Secondary | ICD-10-CM

## 2018-08-21 MED ORDER — ALPRAZOLAM 0.5 MG PO TABS: 0.5000 mg | ORAL_TABLET | Freq: Three times a day (TID) | ORAL | 5 refills | Status: DC | PRN

## 2018-08-21 MED ORDER — KETOCONAZOLE 2 % EX CREA: 1.0000 "application " | TOPICAL_CREAM | Freq: Every day | CUTANEOUS | 0 refills | Status: DC

## 2018-08-21 NOTE — Telephone Encounter (Signed)
Pt now has decent insurance & is not using the [facility] where the alprazolam was sent to on 05/27/18  The Ketoconazole is for a spot of ringworm that has come back on the finger on her left hand

## 2018-09-01 DIAGNOSIS — Z853 Personal history of malignant neoplasm of breast: Secondary | ICD-10-CM | POA: Diagnosis not present

## 2018-09-01 DIAGNOSIS — Z9012 Acquired absence of left breast and nipple: Secondary | ICD-10-CM | POA: Diagnosis not present

## 2018-09-06 ENCOUNTER — Other Ambulatory Visit: Payer: Self-pay | Admitting: Physician Assistant

## 2018-09-06 MED ORDER — VALACYCLOVIR HCL 500 MG PO TABS: 500.0000 mg | ORAL_TABLET | Freq: Two times a day (BID) | ORAL | 5 refills | Status: DC

## 2018-09-08 ENCOUNTER — Other Ambulatory Visit: Payer: Self-pay

## 2018-09-08 ENCOUNTER — Telehealth: Payer: Self-pay | Admitting: Allergy and Immunology

## 2018-09-08 MED ORDER — CARBINOXAMINE MALEATE 4 MG PO TABS: ORAL_TABLET | ORAL | 0 refills | Status: DC

## 2018-09-08 NOTE — Telephone Encounter (Signed)
PT called to get refill of carbinoxamine sent to same pharmacy.

## 2018-09-08 NOTE — Telephone Encounter (Signed)
Informed pt she is due for an office visit made her and appt with anne fnp for Thursday and sent in 1 refill

## 2018-09-11 ENCOUNTER — Other Ambulatory Visit: Payer: Self-pay

## 2018-09-11 ENCOUNTER — Ambulatory Visit (INDEPENDENT_AMBULATORY_CARE_PROVIDER_SITE_OTHER): Payer: BC Managed Care – PPO | Admitting: Family Medicine

## 2018-09-11 ENCOUNTER — Encounter: Payer: Self-pay | Admitting: Family Medicine

## 2018-09-11 VITALS — BP 120/80 | HR 112 | Resp 16 | Ht 63.0 in | Wt 163.6 lb

## 2018-09-11 DIAGNOSIS — B370 Candidal stomatitis: Secondary | ICD-10-CM | POA: Insufficient documentation

## 2018-09-11 DIAGNOSIS — J4540 Moderate persistent asthma, uncomplicated: Secondary | ICD-10-CM | POA: Diagnosis not present

## 2018-09-11 DIAGNOSIS — J3089 Other allergic rhinitis: Secondary | ICD-10-CM

## 2018-09-11 DIAGNOSIS — M79676 Pain in unspecified toe(s): Secondary | ICD-10-CM

## 2018-09-11 DIAGNOSIS — K219 Gastro-esophageal reflux disease without esophagitis: Secondary | ICD-10-CM | POA: Diagnosis not present

## 2018-09-11 MED ORDER — BUDESONIDE-FORMOTEROL FUMARATE 160-4.5 MCG/ACT IN AERO: INHALATION_SPRAY | RESPIRATORY_TRACT | 1 refills | Status: DC

## 2018-09-11 MED ORDER — PAZEO 0.7 % OP SOLN: 1.0000 [drp] | Freq: Every day | OPHTHALMIC | 5 refills | Status: DC

## 2018-09-11 MED ORDER — FAMOTIDINE 20 MG PO TABS: 20.0000 mg | ORAL_TABLET | Freq: Two times a day (BID) | ORAL | 5 refills | Status: AC

## 2018-09-11 MED ORDER — AZELASTINE HCL 0.15 % NA SOLN: NASAL | 5 refills | Status: DC

## 2018-09-11 MED ORDER — CARBINOXAMINE MALEATE 4 MG PO TABS: ORAL_TABLET | ORAL | 5 refills | Status: DC

## 2018-09-11 MED ORDER — ALBUTEROL SULFATE HFA 108 (90 BASE) MCG/ACT IN AERS: 2.0000 | INHALATION_SPRAY | RESPIRATORY_TRACT | 1 refills | Status: DC | PRN

## 2018-09-11 MED ORDER — MONTELUKAST SODIUM 10 MG PO TABS: 10.0000 mg | ORAL_TABLET | Freq: Every day | ORAL | 3 refills | Status: DC

## 2018-09-11 NOTE — Progress Notes (Addendum)
[address] [place] [address] Dept: [phone]  FOLLOW UP NOTE  Patient ID: Heidi Walls, female    DOB: Nov 07, 1955  Age: 63 y.o. MRN: [id number] Date of Office Visit: 09/11/2018  Assessment  Chief Complaint: Asthma  HPI Heidi Walls is a 63 year old female who presents to the clinic for a follow up visit. She was last seen in the clinic on 01/29/2018 by Dr. Verlin Fester for evaluation of asthma, allergic rhinitis, and reflux. She reports that her asthma has been moderately well controlled with no shortness of breath and occasional wheeze occurring mostly at night. She continues montelukast 10 mg once a day and Symbicort 160-2 puffs twice a day with a spacer. She reports infrequent use of albuterol. Despite using a spacer and rinsing diligently after using an inhaler with a corticosteroid, she reports frequent episodes of oral candidiasis for which she takes fluconazole as needed as prescribed by her primary care provider. Allergic rhinitis is reported as not well controlled with "stuffy head" and occasional cough with clear mucus. She continues azelastine, saline nasal rinses, and carbinoxamine as needed. Allergic conjunctivitis is reported as not well controlled over the last few days with symptoms including red, itchy eyes with clear crust on her eyelashes in the morning. She reports an improvement after beginning Patanol over the last day or two. She reports reflux is not well controlled with heartburn several nights a week occurring mostly when lying down at night for which she is currently taking esomeprazole 20 mg once a day with partial relief of symptoms. Her current medications are listed in the chart.    Drug Allergies:  Allergies  Allergen Reactions  . Codeine Shortness Of Breath    Pt reports this was 30 years ago.   . Nsaids   . Lisinopril Cough  . Sulfamethoxazole     Physical Exam: BP 120/80   Pulse (!) 112   Resp 16   Ht 5\' 3"  (1.6 m)   Wt 163 lb 9.6 oz  (74.2 kg)   SpO2 97%   BMI 28.98 kg/m    Physical Exam Vitals signs reviewed.  Constitutional:      Appearance: Normal appearance.  HENT:     Head: Normocephalic and atraumatic.     Right Ear: Tympanic membrane normal.     Left Ear: Tympanic membrane normal.     Nose:     Comments: Bilateral nares slightly erythematous with clear nasal drainage noted.  Ears normal. Eyes normal.    Mouth/Throat:     Comments: Pharynx slightly erythematous with a small white patch on the back of her tongue. No bleeding noted Eyes:     Conjunctiva/sclera: Conjunctivae normal.  Neck:     Musculoskeletal: Normal range of motion and neck supple.  Cardiovascular:     Rate and Rhythm: Normal rate and regular rhythm.     Heart sounds: Normal heart sounds. No murmur.  Pulmonary:     Effort: Pulmonary effort is normal.     Breath sounds: Normal breath sounds.     Comments: Lungs clear to auscultation Musculoskeletal: Normal range of motion.  Skin:    General: Skin is warm and dry.  Neurological:     Mental Status: She is alert and oriented to person, place, and time.  Psychiatric:        Mood and Affect: Mood normal.        Behavior: Behavior normal.        Thought Content: Thought content  normal.        Judgment: Judgment normal.    Diagnostics: FVC 2.38, FEV1 1.97. Predicted FVC 3.13, predicted FEV1 2.41. Spirometry indicates mild restriction. This is consistent with previous spirometry readings.   Assessment and Plan: 1. Moderate persistent asthma without complication   2. Perinnial allergic rhinitis with a probable non-allergic component   3. Gastroesophageal reflux disease, esophagitis presence not specified   4. Oral candidiasis     Meds ordered this encounter  Medications  . montelukast (SINGULAIR) 10 MG tablet    Sig: Take 1 tablet (10 mg total) by mouth at bedtime.    Dispense:  90 tablet    Refill:  3  . DISCONTD: Carbinoxamine Maleate 4 MG TABS    Sig: 1 tablet q 8 hours for  runny nose or itching    Dispense:  120 tablet    Refill:  5    NO MORE REFILLS UNTIL PATIENT IS  SEEN IN CLINIC  . Azelastine HCl 0.15 % SOLN    Sig: One spray each nostril 1-2 times a day as needed    Dispense:  30 mL    Refill:  5  . albuterol (PROAIR HFA) 108 (90 Base) MCG/ACT inhaler    Sig: Inhale 2 puffs into the lungs every 4 (four) hours as needed for wheezing or shortness of breath.    Dispense:  18 g    Refill:  1  . budesonide-formoterol (SYMBICORT) 160-4.5 MCG/ACT inhaler    Sig: 2 puffs twice daily with spacer device to prevent coughing or wheezing.    Dispense:  3 Inhaler    Refill:  1    Symbicort coupon [id number] please dispense 90 day supply  . Olopatadine HCl (PAZEO) 0.7 % SOLN    Sig: Apply 1 drop to eye daily. For itchy eyes    Dispense:  2.5 mL    Refill:  5  . famotidine (PEPCID) 20 MG tablet    Sig: Take 1 tablet (20 mg total) by mouth 2 (two) times daily.    Dispense:  60 tablet    Refill:  5  . Carbinoxamine Maleate 4 MG TABS    Sig: 1 tablet q 8 hours for runny nose or itching    Dispense:  120 tablet    Refill:  5    Patient Instructions  Asthma Continue montelukast 10 mg once a day to prevent cough or wheeze Continue Symbicort 160-2 puffs twice a day with a spacer to prevent cough or wheeze. Continue ProAir 2 puffs every 4 hours as needed for cough or wheeze  Allergic rhinitis Begin Nasacort 1-2 sprays in each nostril once a day as needed for a stuffy nose Continue carbinoxamine 4 mg once every 8 hours as needed for nasal symptoms. Continue azelastine 1-2 sprays in each nostril twice a day as needed for a runny nose or sneezing Continue nasal saline rinses as needed for nasal symptoms  Allergic conjunctivitis Begin Pazeo eye drops once a day as needed for red, itchy eyes. This will replace Patanol  Reflux Begin famotidine 20 mg twice a day for reflux Continue esomeprazole 20 mg once a day as previously  prescribed Continue dietary and lifestyle modifications as listed below  Thrush Continue to use a spacer and rinse your mouth with water and spit without swallowing each time you use Symbicort. Continue the treatment plan with Diflucan as ordered by your primary care provider  Call the clinic if this treatment plan is not working well for  you  Follow up in 6 months or sooner if needed   Return in about 6 months (around 03/11/2019), or if symptoms worsen or fail to improve.    Thank you for the opportunity to care for this patient.  Please do not hesitate to contact me with questions.  Gareth Morgan, FNP [facility] [place]  ________________________________________________  I have provided oversight concerning [name]'s evaluation and treatment of this patient's health issues addressed during today's encounter.  I agree with the assessment and therapeutic plan as outlined in the note.   Signed,   R Edgar Frisk, MD

## 2018-09-11 NOTE — Patient Instructions (Addendum)
Asthma Continue montelukast 10 mg once a day to prevent cough or wheeze Continue Symbicort 160-2 puffs twice a day with a spacer to prevent cough or wheeze. Continue ProAir 2 puffs every 4 hours as needed for cough or wheeze  Allergic rhinitis Begin Nasacort 1-2 sprays in each nostril once a day as needed for a stuffy nose Continue carbinoxamine 4 mg once every 8 hours as needed for nasal symptoms. Continue azelastine 1-2 sprays in each nostril twice a day as needed for a runny nose or sneezing Continue nasal saline rinses as needed for nasal symptoms  Allergic conjunctivitis Begin Pazeo eye drops once a day as needed for red, itchy eyes. This will replace Patanol  Reflux Begin famotidine 20 mg twice a day for reflux Continue esomeprazole 20 mg once a day as previously prescribed Continue dietary and lifestyle modifications as listed below  Thrush Continue to use a spacer and rinse your mouth with water and spit without swallowing each time you use Symbicort. Continue the treatment plan with Diflucan as ordered by your primary care provider  Call the clinic if this treatment plan is not working well for you  Follow up in 6 months or sooner if needed   Lifestyle Changes for Controlling GERD When you have GERD, stomach acid feels as if it's backing up toward your mouth. Whether or not you take medication to control your GERD, your symptoms can often be improved with lifestyle changes.   Raise Your Head  Reflux is more likely to strike when you're lying down flat, because stomach fluid can  flow backward more easily. Raising the head of your bed 4-6 inches can help. To do this:  Slide blocks or books under the legs at the head of your bed. Or, place a wedge under  the mattress. Many foam stores can make a suitable wedge for you. The wedge  should run from your waist to the top of your head.  Don't just prop your head on several pillows. This increases pressure on  your  stomach. It can make GERD worse.  Watch Your Eating Habits Certain foods may increase the acid in your stomach or relax the lower esophageal sphincter, making GERD more likely. It's best to avoid the following:  Coffee, tea, and carbonated drinks (with and without caffeine)  Fatty, fried, or spicy food  Mint, chocolate, onions, and tomatoes  Any other foods that seem to irritate your stomach or cause you pain  Relieve the Pressure  Eat smaller meals, even if you have to eat more often.  Don't lie down right after you eat. Wait a few hours for your stomach to empty.  Avoid tight belts and tight-fitting clothes.  Lose excess weight.  Tobacco and Alcohol  Avoid smoking tobacco and drinking alcohol. They can make GERD symptoms worse.

## 2018-09-19 ENCOUNTER — Ambulatory Visit: Payer: BC Managed Care – PPO | Admitting: Podiatry

## 2018-09-19 ENCOUNTER — Ambulatory Visit (INDEPENDENT_AMBULATORY_CARE_PROVIDER_SITE_OTHER): Payer: BC Managed Care – PPO

## 2018-09-19 ENCOUNTER — Other Ambulatory Visit: Payer: Self-pay

## 2018-09-19 DIAGNOSIS — S92321K Displaced fracture of second metatarsal bone, right foot, subsequent encounter for fracture with nonunion: Secondary | ICD-10-CM | POA: Diagnosis not present

## 2018-09-19 DIAGNOSIS — G5761 Lesion of plantar nerve, right lower limb: Secondary | ICD-10-CM | POA: Diagnosis not present

## 2018-10-06 ENCOUNTER — Telehealth: Payer: Self-pay | Admitting: Hematology and Oncology

## 2018-10-06 NOTE — Telephone Encounter (Signed)
Returned patient's phone call regarding rescheduling 09/30 appointment, per patient's request appointment has been moved to 10/06.

## 2018-10-07 NOTE — Progress Notes (Signed)
Subjective:  Patient ID: Heidi Walls, female    DOB: 08-10-1955,  MRN: [id number]  Chief Complaint  Patient presents with  . Fracture    follow up right foot    63 y.o. female presents with the above complaint. States the bone stim is helping and she is having less pain at the fracture area.  States that injections are helping minimally but it still causes her pain.  Review of Systems: Negative except as noted in the HPI. Denies N/V/F/Ch.  Past Medical History:  Diagnosis Date  . Anemia   . Anxiety    OCD  . Arthritis    knees and hips  . Asthma   . Breast cancer ([place])   . Breast cancer of upper-inner quadrant of left female breast ([place]) 05/26/2015  . GERD (gastroesophageal reflux disease)   . Hypertension   . Insomnia     Current Outpatient Medications:  .  acetaminophen (TYLENOL) 325 MG tablet, Take 650 mg by mouth 2 (two) times daily. , Disp: , Rfl:  .  albuterol (PROAIR HFA) 108 (90 Base) MCG/ACT inhaler, Inhale 2 puffs into the lungs every 4 (four) hours as needed for wheezing or shortness of breath., Disp: 18 g, Rfl: 1 .  alendronate (FOSAMAX) 70 MG tablet, Take 1 tablet (70 mg total) by mouth every 7 (seven) days. Take with a full glass of water on an empty stomach. (Patient not taking: Reported on 09/11/2018), Disp: 12 tablet, Rfl: 3 .  ALPRAZolam (XANAX) 0.5 MG tablet, Take 1 tablet (0.5 mg total) by mouth 3 (three) times daily as needed. (Patient taking differently: Take 0.5 mg by mouth daily. ), Disp: 90 tablet, Rfl: 5 .  Azelastine HCl 0.15 % SOLN, One spray each nostril 1-2 times a day as needed, Disp: 30 mL, Rfl: 5 .  B Complex-C (SUPER B COMPLEX PO), Take 1 tablet by mouth daily., Disp: , Rfl:  .  budesonide-formoterol (SYMBICORT) 160-4.5 MCG/ACT inhaler, 2 puffs twice daily with spacer device to prevent coughing or wheezing., Disp: 3 Inhaler, Rfl: 1 .  Calcium Carb-Cholecalciferol (CALCIUM 600 + D PO), Take 1 tablet by mouth daily., Disp: , Rfl:  .  Carbinoxamine  Maleate 4 MG TABS, 1 tablet q 8 hours for runny nose or itching, Disp: 120 tablet, Rfl: 5 .  diclofenac sodium (VOLTAREN) 1 % GEL, Apply 4 g topically 4 (four) times daily. (Patient not taking: Reported on 09/11/2018), Disp: 400 g, Rfl: 11 .  DULoxetine (CYMBALTA) 60 MG capsule, Take 1 capsule (60 mg total) by mouth daily., Disp: 90 capsule, Rfl: 3 .  esomeprazole (NEXIUM) 20 MG capsule, Take 1 capsule (20 mg total) by mouth daily., Disp: 90 capsule, Rfl: 0 .  exemestane (AROMASIN) 25 MG tablet, Take 1 tablet (25 mg total) by mouth daily after breakfast. (Patient not taking: Reported on 09/11/2018), Disp: 90 tablet, Rfl: 3 .  famotidine (PEPCID) 20 MG tablet, Take 1 tablet (20 mg total) by mouth 2 (two) times daily., Disp: 60 tablet, Rfl: 5 .  fluconazole (DIFLUCAN) 200 MG tablet, 1 po q week x 4 weeks, Disp: 12 tablet, Rfl: 3 .  FOLIC ACID PO, Take 1 tablet by mouth daily., Disp: , Rfl:  .  gabapentin (NEURONTIN) 600 MG tablet, Take 1 tablet (600 mg total) by mouth 3 (three) times daily., Disp: 270 tablet, Rfl: 0 .  ketoconazole (NIZORAL) 2 % cream, Apply 1 application topically daily., Disp: 30 g, Rfl: 0 .  letrozole (FEMARA) 2.5 MG tablet, Take  1 tablet (2.5 mg total) by mouth daily., Disp: 90 tablet, Rfl: 3 .  montelukast (SINGULAIR) 10 MG tablet, Take 1 tablet (10 mg total) by mouth at bedtime., Disp: 90 tablet, Rfl: 3 .  Multiple Vitamin (MULTIVITAMIN) tablet, Take 1 tablet by mouth daily., Disp: , Rfl:  .  olmesartan-hydrochlorothiazide (BENICAR HCT) 20-12.5 MG tablet, Take 1 tablet by mouth daily., Disp: 90 tablet, Rfl: 3 .  olopatadine (PATANOL) 0.1 % ophthalmic solution, Place 1 drop into both eyes 2 (two) times daily., Disp: 15 mL, Rfl: 3 .  Olopatadine HCl (PAZEO) 0.7 % SOLN, Apply 1 drop to eye daily. For itchy eyes, Disp: 2.5 mL, Rfl: 5 .  Omega-3 Fatty Acids (FISH OIL) 1200 MG CAPS, Take 1,200 mg by mouth daily., Disp: , Rfl:  .  Probiotic Product (PROBIOTIC DAILY PO), Take by mouth.,  Disp: , Rfl:  .  Tiotropium Bromide Monohydrate (SPIRIVA RESPIMAT) 1.25 MCG/ACT AERS, Inhale 2 puffs into the lungs daily. (Patient not taking: Reported on 06/24/2018), Disp: 4 g, Rfl: 5 .  traMADol (ULTRAM) 50 MG tablet, Take 1 tablet (50 mg total) by mouth every 6 (six) hours as needed for severe pain., Disp: 120 tablet, Rfl: 5 .  valACYclovir (VALTREX) 500 MG tablet, Take 1 tablet (500 mg total) by mouth 2 (two) times daily., Disp: 20 tablet, Rfl: 5  Social History   Tobacco Use  Smoking Status Former Smoker  . Packs/day: 1.00  . Years: 25.00  . Pack years: 25.00  . Types: Cigarettes  . Quit date: 10/09/2005  . Years since quitting: 13.0  Smokeless Tobacco Never Used    Allergies  Allergen Reactions  . Codeine Shortness Of Breath    Pt reports this was 30 years ago.   . Nsaids   . Lisinopril Cough  . Sulfamethoxazole    Objective:   There were no vitals filed for this visit. There is no height or weight on file to calculate BMI. Constitutional Well developed. Well nourished.  Vascular Dorsalis pedis pulses palpable bilaterally. Posterior tibial pulses palpable bilaterally. Capillary refill normal to all digits.  No cyanosis or clubbing noted. Pedal hair growth normal.  Neurologic Normal speech. Oriented to person, place, and time. Epicritic sensation to light touch grossly present bilaterally.  Dermatologic Nails well groomed and normal in appearance. No open wounds. No skin lesions.  Orthopedic: Point tenderness right 2nd metatarsal base. POP Right 3rd interspace   Radiographs: taken and reviewed incomplete healing of the 2nd metatarsal fracture noted. Assessment:   1. Displaced fracture of second metatarsal bone, right foot, subsequent encounter for fracture with nonunion   2. Morton neuroma, right    Plan:  Patient was evaluated and treated and all questions answered.  Right fracture of metatarsal base, right fourth metatarsal neck -Continue bone stim.  -XR reviewed as above.  Interdigital Neuroma, right -Final injection as below  Procedure: Neuroma Injection Location: Right 3rd interspace Skin Prep: Alcohol. Injectate: 0.5 cc 0.5% marcaine plain, 0.5 cc dexamethasone phosphate. Disposition: Patient tolerated procedure well. Injection site dressed with a band-aid.    Return in about 6 weeks (around 10/31/2018) for Fracture, neuroma f/u right foot .

## 2018-10-08 ENCOUNTER — Ambulatory Visit: Payer: BC Managed Care – PPO | Admitting: Hematology and Oncology

## 2018-10-13 ENCOUNTER — Other Ambulatory Visit: Payer: Self-pay

## 2018-10-13 ENCOUNTER — Ambulatory Visit: Payer: BC Managed Care – PPO | Admitting: Podiatry

## 2018-10-13 ENCOUNTER — Telehealth: Payer: Self-pay | Admitting: Hematology and Oncology

## 2018-10-13 ENCOUNTER — Ambulatory Visit (INDEPENDENT_AMBULATORY_CARE_PROVIDER_SITE_OTHER): Payer: BC Managed Care – PPO

## 2018-10-13 DIAGNOSIS — S93402A Sprain of unspecified ligament of left ankle, initial encounter: Secondary | ICD-10-CM

## 2018-10-13 DIAGNOSIS — M79672 Pain in left foot: Secondary | ICD-10-CM

## 2018-10-13 NOTE — Telephone Encounter (Signed)
Left message for patient to verify webex visit for pre reg °

## 2018-10-13 NOTE — Progress Notes (Signed)
Patient Care Team: Terald Sleeper, PA-C as PCP - General (General Practice) Nicholas Lose, MD as Consulting Physician (Hematology and Oncology) Delice Bison, Charlestine Massed, NP as Nurse Practitioner (Hematology and Oncology) Irene Limbo, MD as Consulting Physician (Plastic Surgery) Excell Seltzer, MD as Consulting Physician (General Surgery)  DIAGNOSIS:    ICD-10-CM   1. Malignant neoplasm of upper-inner quadrant of left breast in female, estrogen receptor positive ([place])  C50.212    Z17.0     SUMMARY OF ONCOLOGIC HISTORY: Oncology History  Breast cancer of upper-inner quadrant of left female breast ([place])  05/26/2015 Initial Diagnosis   Screen detected left breast asymmetry (posterior 1.6 cm): Grade 2-3 IDC ER/PR positive HER-2 positive Ki-67 20% plus calcs (span 6.1 cm): High-grade DCIS with suspicious foci of invasion (4.2 cm apart)   06/24/2015 Surgery   Left simple mastectomy with reconstruction: IDC grade 3, 2.5 cm, with high-grade DCIS, ALH, LVI present, margins negative, 0/1 sentinel node negative, T2 N0 stage II a, ER 100%, PR 5%, HER-2 positive ratio 1.56 with average copy #8.25, Ki-67 20%   07/25/2015 - 11/03/2015 Chemotherapy   Adjuvant chemotherapy with TCH [place] 6 cycles followed by Herceptin maintenance for 1 year   11/28/2015 -  Anti-estrogen oral therapy   Adjuvant anastrozole 1 mg daily; switched to exemestane on 07/08/18     CHIEF COMPLIANT: Follow-up on exemestane therapy for toxicity check  INTERVAL HISTORY: Heidi Walls is a 63 y.o. with above-mentioned history of left breast cancer treated with mastectomy followed by adjuvant chemotherapy and is currently on Letrozole therapy after she could not tolerate anastrozole. She presents to the clinic today for follow-up. Memory issues and body aches and pains. She hurt her foot recently and is having a hard time with pain and discomfort. She has applied for disability.  REVIEW OF SYSTEMS:    Constitutional: Denies fevers, chills or abnormal weight loss Eyes: Denies blurriness of vision Ears, nose, mouth, throat, and face: Denies mucositis or sore throat Respiratory: Denies cough, dyspnea or wheezes Cardiovascular: Denies palpitation, chest discomfort Gastrointestinal: Denies nausea, heartburn or change in bowel habits Skin: Denies abnormal skin rashes Lymphatics: Denies new lymphadenopathy or easy bruising Neurological: Denies numbness, tingling or new weaknesses Behavioral/Psych: Mood is stable, no new changes  Extremities: Foot pain from recent trauma Breast: denies any pain or lumps or nodules in either breasts All other systems were reviewed with the patient and are negative.  I have reviewed the past medical history, past surgical history, social history and family history with the patient and they are unchanged from previous note.  ALLERGIES:  is allergic to codeine; nsaids; lisinopril; and sulfamethoxazole.  MEDICATIONS:  Current Outpatient Medications  Medication Sig Dispense Refill  . acetaminophen (TYLENOL) 325 MG tablet Take 650 mg by mouth 2 (two) times daily.     Marland Kitchen albuterol (PROAIR HFA) 108 (90 Base) MCG/ACT inhaler Inhale 2 puffs into the lungs every 4 (four) hours as needed for wheezing or shortness of breath. 18 g 1  . alendronate (FOSAMAX) 70 MG tablet Take 1 tablet (70 mg total) by mouth every 7 (seven) days. Take with a full glass of water on an empty stomach. 12 tablet 3  . ALPRAZolam (XANAX) 0.5 MG tablet Take 1 tablet (0.5 mg total) by mouth 3 (three) times daily as needed. (Patient taking differently: Take 0.5 mg by mouth daily. ) 90 tablet 5  . Azelastine HCl 0.15 % SOLN One spray each nostril 1-2 times a day as needed 30 mL  5  . B Complex-C (SUPER B COMPLEX PO) Take 1 tablet by mouth daily.    . budesonide-formoterol (SYMBICORT) 160-4.5 MCG/ACT inhaler 2 puffs twice daily with spacer device to prevent coughing or wheezing. 3 Inhaler 1  . Calcium  Carb-Cholecalciferol (CALCIUM 600 + D PO) Take 1 tablet by mouth daily.    . Carbinoxamine Maleate 4 MG TABS 1 tablet q 8 hours for runny nose or itching 120 tablet 5  . diclofenac sodium (VOLTAREN) 1 % GEL Apply 4 g topically 4 (four) times daily. 400 g 11  . DULoxetine (CYMBALTA) 60 MG capsule Take 1 capsule (60 mg total) by mouth daily. 90 capsule 3  . esomeprazole (NEXIUM) 20 MG capsule Take 1 capsule (20 mg total) by mouth daily. 90 capsule 0  . exemestane (AROMASIN) 25 MG tablet Take 1 tablet (25 mg total) by mouth daily after breakfast. 90 tablet 3  . famotidine (PEPCID) 20 MG tablet Take 1 tablet (20 mg total) by mouth 2 (two) times daily. 60 tablet 5  . fluconazole (DIFLUCAN) 200 MG tablet 1 po q week x 4 weeks 12 tablet 3  . FOLIC ACID PO Take 1 tablet by mouth daily.    Marland Kitchen gabapentin (NEURONTIN) 600 MG tablet Take 1 tablet (600 mg total) by mouth 3 (three) times daily. 270 tablet 0  . HYDROcodone-acetaminophen (NORCO/VICODIN) 5-325 MG tablet TAKE 1 TABLET BY MOUTH EVERY 4 TO 6 HOURS AS NEEDED FOR PAIN    . ketoconazole (NIZORAL) 2 % cream Apply 1 application topically daily. 30 g 0  . letrozole (FEMARA) 2.5 MG tablet Take 1 tablet (2.5 mg total) by mouth daily. 90 tablet 3  . montelukast (SINGULAIR) 10 MG tablet Take 1 tablet (10 mg total) by mouth at bedtime. 90 tablet 3  . Multiple Vitamin (MULTIVITAMIN) tablet Take 1 tablet by mouth daily.    Marland Kitchen olmesartan-hydrochlorothiazide (BENICAR HCT) 20-12.5 MG tablet Take 1 tablet by mouth daily. 90 tablet 3  . olopatadine (PATANOL) 0.1 % ophthalmic solution Place 1 drop into both eyes 2 (two) times daily. 15 mL 3  . Olopatadine HCl (PAZEO) 0.7 % SOLN Apply 1 drop to eye daily. For itchy eyes 2.5 mL 5  . Omega-3 Fatty Acids (FISH OIL) 1200 MG CAPS Take 1,200 mg by mouth daily.    . Probiotic Product (PROBIOTIC DAILY PO) Take by mouth.    . Tiotropium Bromide Monohydrate (SPIRIVA RESPIMAT) 1.25 MCG/ACT AERS Inhale 2 puffs into the lungs daily. 4  g 5  . traMADol (ULTRAM) 50 MG tablet Take 1 tablet (50 mg total) by mouth every 6 (six) hours as needed for severe pain. 120 tablet 5  . valACYclovir (VALTREX) 500 MG tablet Take 1 tablet (500 mg total) by mouth 2 (two) times daily. 20 tablet 5   No current facility-administered medications for this visit.     PHYSICAL EXAMINATION: ECOG PERFORMANCE STATUS: 1 - Symptomatic but completely ambulatory No signs or symptoms of breast cancer recurrence.  LABORATORY DATA:  I have reviewed the data as listed CMP Latest Ref Rng & Units 07/08/2018 01/13/2018 11/04/2017  Glucose 70 - 99 mg/dL 84 110(H) 105(H)  BUN 8 - 23 mg/dL 18 20 46(H)  Creatinine 0.44 - 1.00 mg/dL 1.24(H) 1.21(H) 1.57(H)  Sodium 135 - 145 mmol/L 139 136 134  Potassium 3.5 - 5.1 mmol/L 3.1(L) 4.4 5.2  Chloride 98 - 111 mmol/L 98 98 98  CO2 22 - 32 mmol/L _0 Calcium 8.9 - 10.3 mg/dL 9.9  10.4(H) 9.3  Total Protein 6.5 - 8.1 g/dL 7.9 7.0 6.7  Total Bilirubin 0.3 - 1.2 mg/dL 0.2(L) <0.2 <0.2  Alkaline Phos 38 - 126 U/L 112 93 80  AST 15 - 41 U/L 19 18 51(H)  ALT 0 - 44 U/L 20 19 32    Lab Results  Component Value Date   WBC 7.1 07/08/2018   HGB 11.1 (L) 07/08/2018   HCT 35.0 (L) 07/08/2018   MCV 85.6 07/08/2018   PLT 287 07/08/2018   NEUTROABS 4.8 07/08/2018    ASSESSMENT & PLAN:  Breast cancer of upper-inner quadrant of left female breast ([place]) Left simple mastectomy with reconstruction 06/24/2015: IDC grade 3, 2.5 cm, with high-grade DCIS, ALH, LVI present, margins negative, 0/1 sentinel node negative, , ER 100%, PR 5%, HER-2 positive ratio 1.56 with average copy #8.25, Ki-67 20%  Pathologic stage:T2 N0 stage II a Treatment plan: 1. Adjuvant chemotherapy with TCHP X 6 completed 10/30/17followed by Herceptin maintenance for 1 year which completed 08/07/2016 2. Followed by adjuvant hormonal therapy with anastrozole started 11/28/2015  ---------------------------------------------------------------------------------------------------------- Current treatment:  Echocardiogram 05/22/16 LVEF 60-65% Anastrozole started 11/28/15 switched to letrozole 07/08/2018  Letrozole toxicities: 1. Memory problems 2. Body aches and pains  She does not want to take tamoxifen because her neighbor takes it and does not like it.  Bone density test 04/12/2016: T score -2.2. Osteopenia. She is taking Fosamax and calcium and vitamin D. Left foot fractures from walking 5K  Breast cancer surveillance:  Right breast mammogram 06/2018: Benign at [facility] breast density category C  Anxiety: On antianxiety medications  I will see her back in 1 year for follow-up    No orders of the defined types were placed in this encounter.  The patient has a good understanding of the overall plan. she agrees with it. she will call with any problems that may develop before the next visit here.  Nicholas Lose, MD 10/14/2018  Julious Oka Dorshimer am acting as scribe for Dr. Nicholas Lose.  I have reviewed the above documentation for accuracy and completeness, and I agree with the above.

## 2018-10-14 ENCOUNTER — Inpatient Hospital Stay: Payer: BC Managed Care – PPO | Attending: Hematology and Oncology | Admitting: Hematology and Oncology

## 2018-10-14 ENCOUNTER — Other Ambulatory Visit: Payer: Self-pay | Admitting: Podiatry

## 2018-10-14 DIAGNOSIS — Z9221 Personal history of antineoplastic chemotherapy: Secondary | ICD-10-CM

## 2018-10-14 DIAGNOSIS — R413 Other amnesia: Secondary | ICD-10-CM | POA: Insufficient documentation

## 2018-10-14 DIAGNOSIS — M8580 Other specified disorders of bone density and structure, unspecified site: Secondary | ICD-10-CM | POA: Insufficient documentation

## 2018-10-14 DIAGNOSIS — R419 Unspecified symptoms and signs involving cognitive functions and awareness: Secondary | ICD-10-CM

## 2018-10-14 DIAGNOSIS — X58XXXA Exposure to other specified factors, initial encounter: Secondary | ICD-10-CM | POA: Insufficient documentation

## 2018-10-14 DIAGNOSIS — S92902A Unspecified fracture of left foot, initial encounter for closed fracture: Secondary | ICD-10-CM | POA: Insufficient documentation

## 2018-10-14 DIAGNOSIS — R52 Pain, unspecified: Secondary | ICD-10-CM | POA: Diagnosis not present

## 2018-10-14 DIAGNOSIS — Z170 Estrogen receptor positive status [ER+]: Secondary | ICD-10-CM | POA: Diagnosis not present

## 2018-10-14 DIAGNOSIS — S93402A Sprain of unspecified ligament of left ankle, initial encounter: Secondary | ICD-10-CM

## 2018-10-14 DIAGNOSIS — C50212 Malignant neoplasm of upper-inner quadrant of left female breast: Secondary | ICD-10-CM | POA: Insufficient documentation

## 2018-10-14 DIAGNOSIS — Z79811 Long term (current) use of aromatase inhibitors: Secondary | ICD-10-CM | POA: Diagnosis not present

## 2018-10-14 DIAGNOSIS — F419 Anxiety disorder, unspecified: Secondary | ICD-10-CM | POA: Insufficient documentation

## 2018-10-14 DIAGNOSIS — Z9012 Acquired absence of left breast and nipple: Secondary | ICD-10-CM | POA: Diagnosis not present

## 2018-10-14 MED ORDER — LETROZOLE 2.5 MG PO TABS: 2.5000 mg | ORAL_TABLET | Freq: Every day | ORAL | 3 refills | Status: DC

## 2018-10-14 NOTE — Assessment & Plan Note (Signed)
Left simple mastectomy with reconstruction 06/24/2015: IDC grade 3, 2.5 cm, with high-grade DCIS, ALH, LVI present, margins negative, 0/1 sentinel node negative, , ER 100%, PR 5%, HER-2 positive ratio 1.56 with average copy #8.25, Ki-67 20%  Pathologic stage:T2 N0 stage II a Treatment plan: 1. Adjuvant chemotherapy with TCHP X 6 completed 10/30/17followed by Herceptin maintenance for 1 year which completed 08/07/2016 2. Followed by adjuvant hormonal therapy with anastrozole started 11/28/2015 ---------------------------------------------------------------------------------------------------------- Current treatment:  Echocardiogram 05/22/16 LVEF 60-65% Anastrozole started 11/28/15 switched to exemestane 07/08/2018  Exemestane toxicities:   She does not want to take tamoxifen because her neighbor takes it and does not like it.  Bone density test 04/12/2016: T score -2.2. Osteopenia. She is taking Fosamax and calcium and vitamin D. Left foot fractures from walking 5K  Breast cancer surveillance: Right breast mammogram 06/12/2017: Benign at [facility] breast density category C  Anxiety: On antianxiety medications  I will see her back in 6 months for follow-up

## 2018-10-16 NOTE — Progress Notes (Signed)
   Subjective:  63 y.o. female presenting today with a chief complaint of severe left ankle pain that began earlier today secondary to an injury. She states she twisted the ankle and felt a popping sensation. She has not had any treatment or taken anything for relief. Moving the ankle and bearing weight increases the pain. Patient is here for further evaluation and treatment.   Past Medical History:  Diagnosis Date  . Anemia   . Anxiety    OCD  . Arthritis    knees and hips  . Asthma   . Breast cancer ([place])   . Breast cancer of upper-inner quadrant of left female breast ([place]) 05/26/2015  . GERD (gastroesophageal reflux disease)   . Hypertension   . Insomnia      Objective / Physical Exam:  General:  The patient is alert and oriented x3 in no acute distress. Dermatology:  Skin is warm, dry and supple bilateral lower extremities. Negative for open lesions or macerations. Vascular:  Palpable pedal pulses bilaterally. No edema or erythema noted. Capillary refill within normal limits. Neurological:  Epicritic and protective threshold grossly intact bilaterally.  Musculoskeletal Exam:  Pain on palpation, edema and ecchymosis noted to the left lateral ankle. Range of motion within normal limits to all pedal and ankle joints bilateral. Muscle strength 5/5 in all groups bilateral.   Radiographic Exam:  Normal osseous mineralization. Joint spaces preserved. No fracture/dislocation/boney destruction.    Assessment: 1. Ankle sprain left, initial encounter   Plan of Care:  1. Patient was evaluated. X-Rays reviewed.  2. Patient has CAM boot at home. Weightbearing for four weeks.  3. Cannot take oral NSAIDs due to CKD.  4. Ace wrap applied.  5. Return to clinic in 4 weeks with Dr. March Rummage.    Edrick Kins, DPM [facility]  Dr. Edrick Kins, [address]                                        [place], [place] [address]                Office [phone]   Fax [phone]

## 2018-10-22 ENCOUNTER — Other Ambulatory Visit: Payer: Self-pay | Admitting: Physician Assistant

## 2018-10-22 ENCOUNTER — Telehealth: Payer: Self-pay | Admitting: Physician Assistant

## 2018-10-22 DIAGNOSIS — M25551 Pain in right hip: Secondary | ICD-10-CM

## 2018-10-22 DIAGNOSIS — M2550 Pain in unspecified joint: Secondary | ICD-10-CM

## 2018-10-22 NOTE — Telephone Encounter (Signed)
Patient is aware and verbalized understanding.

## 2018-10-22 NOTE — Telephone Encounter (Signed)
I am going to place an order for an arthritis profile to be drawn.  This will check for rheumatoid factor, uric acid, lupus factor.  If this is positive she is here to have the rheumatology appointment.

## 2018-10-23 ENCOUNTER — Other Ambulatory Visit: Payer: Self-pay

## 2018-10-23 ENCOUNTER — Other Ambulatory Visit: Payer: BC Managed Care – PPO

## 2018-10-23 DIAGNOSIS — M25551 Pain in right hip: Secondary | ICD-10-CM | POA: Diagnosis not present

## 2018-10-23 DIAGNOSIS — M25552 Pain in left hip: Secondary | ICD-10-CM

## 2018-10-23 DIAGNOSIS — M2550 Pain in unspecified joint: Secondary | ICD-10-CM | POA: Diagnosis not present

## 2018-10-26 LAB — ARTHRITIS PANEL
Basophils Absolute: 0 10*3/uL (ref 0.0–0.2)
Basos: 1 %
EOS (ABSOLUTE): 0.1 10*3/uL (ref 0.0–0.4)
Eos: 1 %
Hematocrit: 34.1 % (ref 34.0–46.6)
Hemoglobin: 11 g/dL — ABNORMAL LOW (ref 11.1–15.9)
Immature Grans (Abs): 0 10*3/uL (ref 0.0–0.1)
Immature Granulocytes: 0 %
Lymphocytes Absolute: 1.7 10*3/uL (ref 0.7–3.1)
Lymphs: 22 %
MCH: 27.4 pg (ref 26.6–33.0)
MCHC: 32.3 g/dL (ref 31.5–35.7)
MCV: 85 fL (ref 79–97)
Monocytes Absolute: 0.5 10*3/uL (ref 0.1–0.9)
Monocytes: 6 %
Neutrophils Absolute: 5.4 10*3/uL (ref 1.4–7.0)
Neutrophils: 70 %
Platelets: 369 10*3/uL (ref 150–450)
RBC: 4.02 x10E6/uL (ref 3.77–5.28)
RDW: 14 % (ref 11.7–15.4)
Rheumatoid fact SerPl-aCnc: <10 IU/mL (ref 0.0–13.9)
Sed Rate: 71 mm/hr — ABNORMAL HIGH (ref 0–40)
Uric Acid: 6.8 mg/dL (ref 2.5–7.1)
WBC: 7.7 10*3/uL (ref 3.4–10.8)

## 2018-10-26 LAB — ANA,IFA RA DIAG PNL W/RFLX TIT/PATN
ANA Titer 1: NEGATIVE
Cyclic Citrullin Peptide Ab: 3 units (ref 0–19)

## 2018-10-27 ENCOUNTER — Other Ambulatory Visit: Payer: Self-pay | Admitting: *Deleted

## 2018-10-27 DIAGNOSIS — M2550 Pain in unspecified joint: Secondary | ICD-10-CM

## 2018-10-27 DIAGNOSIS — M791 Myalgia, unspecified site: Secondary | ICD-10-CM

## 2018-11-02 ENCOUNTER — Other Ambulatory Visit: Payer: Self-pay | Admitting: Physician Assistant

## 2018-11-02 DIAGNOSIS — G629 Polyneuropathy, unspecified: Secondary | ICD-10-CM

## 2018-11-02 DIAGNOSIS — G893 Neoplasm related pain (acute) (chronic): Secondary | ICD-10-CM

## 2018-11-07 ENCOUNTER — Ambulatory Visit: Payer: BC Managed Care – PPO | Admitting: Podiatry

## 2018-11-11 NOTE — Progress Notes (Deleted)
Office Visit Note  Patient: Heidi Walls             Date of Birth: [birth year]           MRN: [id number]             PCP: Terald Sleeper, PA-C Referring: Terald Sleeper, PA-C Visit Date: 11/25/2018 Occupation: @GUAROCC @  Subjective:  No chief complaint on file.   History of Present Illness: Heidi Walls is a 63 y.o. female ***   Activities of Daily Living:  Patient reports morning stiffness for *** {minute/hour:19697}.   Patient {ACTIONS;DENIES/REPORTS:21021675::"Denies"} nocturnal pain.  Difficulty dressing/grooming: {ACTIONS;DENIES/REPORTS:21021675::"Denies"} Difficulty climbing stairs: {ACTIONS;DENIES/REPORTS:21021675::"Denies"} Difficulty getting out of chair: {ACTIONS;DENIES/REPORTS:21021675::"Denies"} Difficulty using hands for taps, buttons, cutlery, and/or writing: {ACTIONS;DENIES/REPORTS:21021675::"Denies"}  No Rheumatology ROS completed.   PMFS History:  Patient Active Problem List   Diagnosis Date Noted  . Oral candidiasis 09/11/2018  . Bilateral hip pain 06/19/2018  . Pain of both hip joints 06/19/2018  . Chronic left-sided low back pain with left-sided sciatica 06/19/2018  . Elevated cholesterol 01/15/2018  . Arthritis of knee, left 07/02/2017  . Arthritis of right knee 01/29/2017  . Baker's cyst of knee, left 01/02/2017  . Perennial allergic rhinitis with a probable nonallergic component 12/12/2016  . Neuropathy 11/19/2016  . Chronic pain 11/19/2016  . Dysphonia 10/03/2016  . Vocal cord atrophy 10/03/2016  . Moderate persistent asthma 07/10/2016  . Bronchitis 07/10/2016  . Panic attack 10/23/2015  . Anemia 10/22/2015  . Chemotherapy induced diarrhea 10/17/2015  . Osteoporosis 09/07/2015  . Generalized anxiety disorder 09/07/2015  . Osteoarthritis of right knee 09/07/2015  . Gastroesophageal reflux disease 09/07/2015  . Depression 09/07/2015  . Acquired absence of left breast and nipple 06/30/2015  . Breast cancer of upper-inner quadrant of  left female breast ([place]) 05/26/2015    Past Medical History:  Diagnosis Date  . Anemia   . Anxiety    OCD  . Arthritis    knees and hips  . Asthma   . Breast cancer ([place])   . Breast cancer of upper-inner quadrant of left female breast ([place]) 05/26/2015  . GERD (gastroesophageal reflux disease)   . Hypertension   . Insomnia     Family History  Problem Relation Age of Onset  . Heart failure Mother   . Diabetes Mother   . Hypertension Mother   . Hypertension Father   . Breast cancer Maternal Grandmother   . Allergic rhinitis Sister   . Sinusitis Sister   . Allergic rhinitis Daughter   . Allergic rhinitis Son   . Sinusitis Son   . Bronchitis Grandchild   . Asthma Neg Hx   . Eczema Neg Hx   . Urticaria Neg Hx   . Immunodeficiency Neg Hx   . Angioedema Neg Hx    Past Surgical History:  Procedure Laterality Date  . BREAST RECONSTRUCTION WITH PLACEMENT OF TISSUE EXPANDER AND FLEX HD (ACELLULAR HYDRATED DERMIS) Left 06/24/2015   Procedure: LEFT BREAST RECONSTRUCTION WITH PLACEMENT OF TISSUE EXPANDER AND  ACELLULAR HYDRATED DERMIS (CORTIVA);  Surgeon: Irene Limbo, MD;  Location: [place];  Service: Plastics;  Laterality: Left;  . KNEE SURGERY Right 2000   arthroscopy  . LIPOSUCTION WITH LIPOFILLING Left 12/23/2015   Procedure: LIPOFILLING TO LEFT CHEST FROM ABDOMEN  ;  Surgeon: Irene Limbo, MD;  Location: [place];  Service: Plastics;  Laterality: Left;  Marland Kitchen MASTECTOMY W/ SENTINEL NODE BIOPSY Left 06/24/2015   Procedure: LEFT TOTAL  MASTECTOMY WITH SENTINEL LYMPH NODE BIOPSY;  Surgeon: Excell Seltzer, MD;  Location: [place];  Service: General;  Laterality: Left;  Marland Kitchen MASTOPEXY Right 12/23/2015   Procedure: RIGHT BREAST MASTOPEXY;  Surgeon: Irene Limbo, MD;  Location: [place];  Service: Plastics;  Laterality: Right;  . MICROLARYNGOSCOPY W/VOCAL CORD INJECTION N/A 10/15/2016   Procedure: SUSPENDED DIRECT  MICROLARYNGOSCOPY WITH VOCAL CORD INJECTION WITH JET VENTILATION;  Surgeon: Melida Quitter, MD;  Location: [place];  Service: ENT;  Laterality: N/A;  . PORTACATH PLACEMENT Right 06/24/2015   Procedure: INSERTION PORT-A-CATH;  Surgeon: Excell Seltzer, MD;  Location: [place];  Service: General;  Laterality: Right;  . portacath removal  09/05/2016  . REMOVAL OF BILATERAL TISSUE EXPANDERS WITH PLACEMENT OF BILATERAL BREAST IMPLANTS Left 12/23/2015   Procedure: REMOVAL OF LEFT  TISSUE EXPANDERS WITH PLACEMENT OF LEFT  BREAST IMPLANTS;  Surgeon: Irene Limbo, MD;  Location: [place];  Service: Plastics;  Laterality: Left;  . TONSILLECTOMY     Social History   Social History Narrative  . Not on file   Immunization History  Administered Date(s) Administered  . Influenza Inj Mdck Quad Pf 11/03/2016  . Influenza,inj,Quad PF,6+ Mos 10/17/2015, 10/03/2017, 09/11/2018     Objective: Vital Signs: There were no vitals taken for this visit.   Physical Exam   Musculoskeletal Exam: ***  CDAI Exam: CDAI Score: - Patient Global: -; Provider Global: - Swollen: -; Tender: - Joint Exam   No joint exam has been documented for this visit   There is currently no information documented on the homunculus. Go to the Rheumatology activity and complete the homunculus joint exam.  Investigation: Findings:  10/23/18: ANA-, Anti-CCP 3, RF<10, sed rate 71, uric acid 6.8   Imaging: Dg Ankle 2 Views Left  Result Date: 10/14/2018 Please see detailed radiograph report in office note.  Dg Foot Complete Left  Result Date: 10/14/2018 Please see detailed radiograph report in office note.   Recent Labs: Lab Results  Component Value Date   WBC 7.7 10/23/2018   HGB 11.0 (L) 10/23/2018   PLT 369 10/23/2018   NA 139 07/08/2018   K 3.1 (L) 07/08/2018   CL 98 07/08/2018   CO2 28 07/08/2018   GLUCOSE 84 07/08/2018   BUN 18 07/08/2018   CREATININE 1.24 (H) 07/08/2018    BILITOT 0.2 (L) 07/08/2018   ALKPHOS 112 07/08/2018   AST 19 07/08/2018   ALT 20 07/08/2018   PROT 7.9 07/08/2018   ALBUMIN 4.0 07/08/2018   CALCIUM 9.9 07/08/2018   GFRAA 54 (L) 07/08/2018    Speciality Comments: No specialty comments available.  Procedures:  No procedures performed Allergies: Codeine, Nsaids, Lisinopril, and Sulfamethoxazole   Assessment / Plan:     Visit Diagnoses: No diagnosis found.  Orders: No orders of the defined types were placed in this encounter.  No orders of the defined types were placed in this encounter.   Face-to-face time spent with patient was *** minutes. Greater than 50% of time was spent in counseling and coordination of care.  Follow-Up Instructions: No follow-ups on file.   Ofilia Neas, PA-C  Note - This record has been created using Dragon software.  Chart creation errors have been sought, but may not always  have been located. Such creation errors do not reflect on  the standard of medical care.

## 2018-11-25 ENCOUNTER — Ambulatory Visit: Payer: BC Managed Care – PPO | Admitting: Rheumatology

## 2018-11-27 ENCOUNTER — Ambulatory Visit: Payer: Self-pay

## 2018-11-27 ENCOUNTER — Ambulatory Visit: Payer: BC Managed Care – PPO | Admitting: Orthopaedic Surgery

## 2018-11-27 ENCOUNTER — Other Ambulatory Visit: Payer: Self-pay

## 2018-11-27 ENCOUNTER — Ambulatory Visit (INDEPENDENT_AMBULATORY_CARE_PROVIDER_SITE_OTHER): Payer: BC Managed Care – PPO

## 2018-11-27 ENCOUNTER — Encounter: Payer: Self-pay | Admitting: Orthopaedic Surgery

## 2018-11-27 ENCOUNTER — Other Ambulatory Visit: Payer: Self-pay | Admitting: Podiatry

## 2018-11-27 ENCOUNTER — Ambulatory Visit: Payer: BC Managed Care – PPO | Admitting: Podiatry

## 2018-11-27 VITALS — Ht 62.5 in | Wt 160.0 lb

## 2018-11-27 DIAGNOSIS — M1712 Unilateral primary osteoarthritis, left knee: Secondary | ICD-10-CM | POA: Diagnosis not present

## 2018-11-27 DIAGNOSIS — G8929 Other chronic pain: Secondary | ICD-10-CM

## 2018-11-27 DIAGNOSIS — G5763 Lesion of plantar nerve, bilateral lower limbs: Secondary | ICD-10-CM | POA: Diagnosis not present

## 2018-11-27 DIAGNOSIS — S82892A Other fracture of left lower leg, initial encounter for closed fracture: Secondary | ICD-10-CM | POA: Diagnosis not present

## 2018-11-27 DIAGNOSIS — S82839A Other fracture of upper and lower end of unspecified fibula, initial encounter for closed fracture: Secondary | ICD-10-CM

## 2018-11-27 DIAGNOSIS — S82832A Other fracture of upper and lower end of left fibula, initial encounter for closed fracture: Secondary | ICD-10-CM | POA: Diagnosis not present

## 2018-11-27 DIAGNOSIS — S92321K Displaced fracture of second metatarsal bone, right foot, subsequent encounter for fracture with nonunion: Secondary | ICD-10-CM

## 2018-11-27 DIAGNOSIS — M25561 Pain in right knee: Secondary | ICD-10-CM | POA: Diagnosis not present

## 2018-11-27 DIAGNOSIS — S93402A Sprain of unspecified ligament of left ankle, initial encounter: Secondary | ICD-10-CM

## 2018-11-27 DIAGNOSIS — M1711 Unilateral primary osteoarthritis, right knee: Secondary | ICD-10-CM | POA: Diagnosis not present

## 2018-11-27 NOTE — Progress Notes (Signed)
Office Visit Note   Patient: Heidi Walls           Date of Birth: 1955/06/29           MRN: [id number] Visit Date: 11/27/2018              Requested by: Terald Sleeper, PA-C [address] [place],  [place] [address] PCP: Terald Sleeper, PA-C   Assessment & Plan: Visit Diagnoses:  1. Chronic pain of right knee   2. Arthritis of knee, left   3. Arthritis of right knee     Plan: Osteoarthritis both right and left knee of moderate degree.  Long discussion regarding diagnosis and treatment options.  She actually has an appointment to see a rheumatologist, Dr. Lenna Gilford, tomorrow.  Dr. Lenna Gilford can counsel her on appropriate medicines as she has difficulty with NSAIDs related to renal issues.  I had suggested cortisone or possibly viscosupplementation.  We also briefly discussed knee replacement as definitive treatment.  I do not think she is ready for that nor does she needed at this point  Follow-Up Instructions: Return if symptoms worsen or fail to improve.   Orders:  Orders Placed This Encounter  Procedures  . XR KNEE 3 VIEW RIGHT   No orders of the defined types were placed in this encounter.     Procedures: No procedures performed   Clinical Data: No additional findings.   Subjective: Chief Complaint  Patient presents with  . Right Knee - Pain  Patient presents today for right knee pain. She said that it started many years ago. Her pain is located medially and locks up. She has been wearing a brace consistently and states that it has helped with her swelling. She is only allowed to take Tramadol for pain due to issues with kidneys. She had arthroscopic right knee surgery 23 years ago.   HPI  Review of Systems   Objective: Vital Signs: Ht 5' 2.5" (1.588 m)   Wt 160 lb (72.6 kg)   BMI 28.80 kg/m   Physical Exam Constitutional:      Appearance: She is well-developed.  Eyes:     Pupils: Pupils are equal, round, and reactive to light.  Pulmonary:     Effort:  Pulmonary effort is normal.  Skin:    General: Skin is warm and dry.  Neurological:     Mental Status: She is alert and oriented to person, place, and time.  Psychiatric:        Behavior: Behavior normal.     Ortho Exam awake alert and oriented x3.  Comfortable sitting.  Mild medial joint pain both knees with possibly very small effusion.  Some patellar crepitation.  Skin intact.  Full and slight hyperextension of both knees and flexed over 105 degrees without instability.  No distal edema.  Neurologically intact Specialty Comments:  No specialty comments available.  Imaging: Dg Ankle 2 Views Left  Result Date: 11/27/2018 Please see detailed radiograph report in office note.  Dg Foot Complete Left  Result Date: 11/27/2018 Please see detailed radiograph report in office note.  Xr Knee 3 View Right  Result Date: 11/27/2018 Films of the right knee demonstrate tricompartmental degenerative changes predominantly medial compartment where there is irregularity of the femoral joint surface narrowing of the joint and peripheral osteophytes.  There are also with subchondral sclerosis.  There are degenerative changes in the medial compartment and patellofemoral joint as well.  Films are consistent with moderate osteoarthritis without CPPD or  acute change    PMFS History: Patient Active Problem List   Diagnosis Date Noted  . Oral candidiasis 09/11/2018  . Bilateral hip pain 06/19/2018  . Pain of both hip joints 06/19/2018  . Chronic left-sided low back pain with left-sided sciatica 06/19/2018  . Elevated cholesterol 01/15/2018  . Arthritis of knee, left 07/02/2017  . Arthritis of right knee 01/29/2017  . Baker's cyst of knee, left 01/02/2017  . Perennial allergic rhinitis with a probable nonallergic component 12/12/2016  . Neuropathy 11/19/2016  . Chronic pain 11/19/2016  . Dysphonia 10/03/2016  . Vocal cord atrophy 10/03/2016  . Moderate persistent asthma 07/10/2016  .  Bronchitis 07/10/2016  . Panic attack 10/23/2015  . Anemia 10/22/2015  . Chemotherapy induced diarrhea 10/17/2015  . Osteoporosis 09/07/2015  . Generalized anxiety disorder 09/07/2015  . Osteoarthritis of right knee 09/07/2015  . Gastroesophageal reflux disease 09/07/2015  . Depression 09/07/2015  . Acquired absence of left breast and nipple 06/30/2015  . Breast cancer of upper-inner quadrant of left female breast ([facility]) 05/26/2015   Past Medical History:  Diagnosis Date  . Anemia   . Anxiety    OCD  . Arthritis    knees and hips  . Asthma   . Breast cancer ([facility])   . Breast cancer of upper-inner quadrant of left female breast ([facility]) 05/26/2015  . GERD (gastroesophageal reflux disease)   . Hypertension   . Insomnia     Family History  Problem Relation Age of Onset  . Heart failure Mother   . Diabetes Mother   . Hypertension Mother   . Hypertension Father   . Breast cancer Maternal Grandmother   . Allergic rhinitis Sister   . Sinusitis Sister   . Allergic rhinitis Daughter   . Allergic rhinitis Son   . Sinusitis Son   . Bronchitis Grandchild   . Asthma Neg Hx   . Eczema Neg Hx   . Urticaria Neg Hx   . Immunodeficiency Neg Hx   . Angioedema Neg Hx     Past Surgical History:  Procedure Laterality Date  . BREAST RECONSTRUCTION WITH PLACEMENT OF TISSUE EXPANDER AND FLEX HD (ACELLULAR HYDRATED DERMIS) Left 06/24/2015   Procedure: LEFT BREAST RECONSTRUCTION WITH PLACEMENT OF TISSUE EXPANDER AND  ACELLULAR HYDRATED DERMIS (CORTIVA);  Surgeon: Irene Limbo, MD;  Location: [facility];  Service: Plastics;  Laterality: Left;  . KNEE SURGERY Right 2000   arthroscopy  . LIPOSUCTION WITH LIPOFILLING Left 12/23/2015   Procedure: LIPOFILLING TO LEFT CHEST FROM ABDOMEN  ;  Surgeon: Irene Limbo, MD;  Location: [facility];  Service: Plastics;  Laterality: Left;  Marland Kitchen MASTECTOMY W/ SENTINEL NODE BIOPSY Left 06/24/2015   Procedure: LEFT TOTAL MASTECTOMY  WITH SENTINEL LYMPH NODE BIOPSY;  Surgeon: Excell Seltzer, MD;  Location: [facility];  Service: General;  Laterality: Left;  Marland Kitchen MASTOPEXY Right 12/23/2015   Procedure: RIGHT BREAST MASTOPEXY;  Surgeon: Irene Limbo, MD;  Location: [facility];  Service: Plastics;  Laterality: Right;  . MICROLARYNGOSCOPY W/VOCAL CORD INJECTION N/A 10/15/2016   Procedure: SUSPENDED DIRECT MICROLARYNGOSCOPY WITH VOCAL CORD INJECTION WITH JET VENTILATION;  Surgeon: Melida Quitter, MD;  Location: [facility];  Service: ENT;  Laterality: N/A;  . PORTACATH PLACEMENT Right 06/24/2015   Procedure: INSERTION PORT-A-CATH;  Surgeon: Excell Seltzer, MD;  Location: [facility];  Service: General;  Laterality: Right;  . portacath removal  09/05/2016  . REMOVAL OF BILATERAL TISSUE EXPANDERS WITH PLACEMENT OF BILATERAL BREAST  IMPLANTS Left 12/23/2015   Procedure: REMOVAL OF LEFT  TISSUE EXPANDERS WITH PLACEMENT OF LEFT  BREAST IMPLANTS;  Surgeon: Irene Limbo, MD;  Location: [facility];  Service: Plastics;  Laterality: Left;  . TONSILLECTOMY     Social History   Occupational History  . Not on file  Tobacco Use  . Smoking status: Former Smoker    Packs/day: 1.00    Years: 25.00    Pack years: 25.00    Types: Cigarettes    Quit date: 10/09/2005    Years since quitting: 13.1  . Smokeless tobacco: Never Used  Substance and Sexual Activity  . Alcohol use: No    Frequency: Never  . Drug use: No  . Sexual activity: Not on file

## 2018-11-28 DIAGNOSIS — M2550 Pain in unspecified joint: Secondary | ICD-10-CM | POA: Diagnosis not present

## 2018-11-28 DIAGNOSIS — M797 Fibromyalgia: Secondary | ICD-10-CM | POA: Diagnosis not present

## 2018-11-28 DIAGNOSIS — L409 Psoriasis, unspecified: Secondary | ICD-10-CM | POA: Diagnosis not present

## 2018-11-28 DIAGNOSIS — M545 Low back pain: Secondary | ICD-10-CM | POA: Diagnosis not present

## 2018-12-09 DIAGNOSIS — M9903 Segmental and somatic dysfunction of lumbar region: Secondary | ICD-10-CM | POA: Diagnosis not present

## 2018-12-09 DIAGNOSIS — M9901 Segmental and somatic dysfunction of cervical region: Secondary | ICD-10-CM | POA: Diagnosis not present

## 2018-12-09 DIAGNOSIS — M545 Low back pain: Secondary | ICD-10-CM | POA: Diagnosis not present

## 2018-12-09 DIAGNOSIS — M25562 Pain in left knee: Secondary | ICD-10-CM | POA: Diagnosis not present

## 2018-12-11 ENCOUNTER — Ambulatory Visit (INDEPENDENT_AMBULATORY_CARE_PROVIDER_SITE_OTHER): Payer: BC Managed Care – PPO | Admitting: Podiatry

## 2018-12-11 ENCOUNTER — Other Ambulatory Visit: Payer: Self-pay

## 2018-12-11 DIAGNOSIS — M545 Low back pain: Secondary | ICD-10-CM | POA: Diagnosis not present

## 2018-12-11 DIAGNOSIS — S93402A Sprain of unspecified ligament of left ankle, initial encounter: Secondary | ICD-10-CM | POA: Diagnosis not present

## 2018-12-11 DIAGNOSIS — S82839A Other fracture of upper and lower end of unspecified fibula, initial encounter for closed fracture: Secondary | ICD-10-CM | POA: Diagnosis not present

## 2018-12-11 DIAGNOSIS — G5763 Lesion of plantar nerve, bilateral lower limbs: Secondary | ICD-10-CM

## 2018-12-11 DIAGNOSIS — M9903 Segmental and somatic dysfunction of lumbar region: Secondary | ICD-10-CM | POA: Diagnosis not present

## 2018-12-11 DIAGNOSIS — M9901 Segmental and somatic dysfunction of cervical region: Secondary | ICD-10-CM | POA: Diagnosis not present

## 2018-12-11 DIAGNOSIS — M25562 Pain in left knee: Secondary | ICD-10-CM | POA: Diagnosis not present

## 2018-12-16 DIAGNOSIS — M25562 Pain in left knee: Secondary | ICD-10-CM | POA: Diagnosis not present

## 2018-12-16 DIAGNOSIS — M545 Low back pain: Secondary | ICD-10-CM | POA: Diagnosis not present

## 2018-12-16 DIAGNOSIS — M9901 Segmental and somatic dysfunction of cervical region: Secondary | ICD-10-CM | POA: Diagnosis not present

## 2018-12-16 DIAGNOSIS — M9903 Segmental and somatic dysfunction of lumbar region: Secondary | ICD-10-CM | POA: Diagnosis not present

## 2018-12-17 ENCOUNTER — Ambulatory Visit: Payer: BC Managed Care – PPO | Admitting: Rheumatology

## 2018-12-18 DIAGNOSIS — M9903 Segmental and somatic dysfunction of lumbar region: Secondary | ICD-10-CM | POA: Diagnosis not present

## 2018-12-18 DIAGNOSIS — M9901 Segmental and somatic dysfunction of cervical region: Secondary | ICD-10-CM | POA: Diagnosis not present

## 2018-12-18 DIAGNOSIS — M25562 Pain in left knee: Secondary | ICD-10-CM | POA: Diagnosis not present

## 2018-12-18 DIAGNOSIS — M545 Low back pain: Secondary | ICD-10-CM | POA: Diagnosis not present

## 2018-12-21 NOTE — Progress Notes (Signed)
Subjective:  Patient ID: Heidi Walls, female    DOB: 1955/08/16,  MRN: [id number]  Chief Complaint  Patient presents with  . Ankle Pain    Pt states ankle sprain has not improved in her left ankle, still lots of pain and swelling.  . Neuroma    Pt states right foot neuroma is still very painful, injections help but only temporarily.    63 y.o. female presents with the above complaint.  History above confirmed with patient Review of Systems: Negative except as noted in the HPI. Denies N/V/F/Ch.  Past Medical History:  Diagnosis Date  . Anemia   . Anxiety    OCD  . Arthritis    knees and hips  . Asthma   . Breast cancer ([facility])   . Breast cancer of upper-inner quadrant of left female breast ([facility]) 05/26/2015  . GERD (gastroesophageal reflux disease)   . Hypertension   . Insomnia     Current Outpatient Medications:  .  acetaminophen (TYLENOL) 325 MG tablet, Take 650 mg by mouth 2 (two) times daily. , Disp: , Rfl:  .  albuterol (PROAIR HFA) 108 (90 Base) MCG/ACT inhaler, Inhale 2 puffs into the lungs every 4 (four) hours as needed for wheezing or shortness of breath., Disp: 18 g, Rfl: 1 .  alendronate (FOSAMAX) 70 MG tablet, Take 1 tablet (70 mg total) by mouth every 7 (seven) days. Take with a full glass of water on an empty stomach., Disp: 12 tablet, Rfl: 3 .  ALPRAZolam (XANAX) 0.5 MG tablet, Take 1 tablet (0.5 mg total) by mouth 3 (three) times daily as needed. (Patient taking differently: Take 0.5 mg by mouth daily. ), Disp: 90 tablet, Rfl: 5 .  Azelastine HCl 0.15 % SOLN, One spray each nostril 1-2 times a day as needed, Disp: 30 mL, Rfl: 5 .  B Complex-C (SUPER B COMPLEX PO), Take 1 tablet by mouth daily., Disp: , Rfl:  .  budesonide-formoterol (SYMBICORT) 160-4.5 MCG/ACT inhaler, 2 puffs twice daily with spacer device to prevent coughing or wheezing., Disp: 3 Inhaler, Rfl: 1 .  Calcium Carb-Cholecalciferol (CALCIUM 600 + D PO), Take 1 tablet by mouth daily., Disp: , Rfl:  .   Carbinoxamine Maleate 4 MG TABS, 1 tablet q 8 hours for runny nose or itching, Disp: 120 tablet, Rfl: 5 .  diclofenac sodium (VOLTAREN) 1 % GEL, Apply 4 g topically 4 (four) times daily., Disp: 400 g, Rfl: 11 .  DULoxetine (CYMBALTA) 60 MG capsule, Take 1 capsule (60 mg total) by mouth daily., Disp: 90 capsule, Rfl: 3 .  esomeprazole (NEXIUM) 20 MG capsule, Take 1 capsule (20 mg total) by mouth daily., Disp: 90 capsule, Rfl: 0 .  famotidine (PEPCID) 20 MG tablet, Take 1 tablet (20 mg total) by mouth 2 (two) times daily., Disp: 60 tablet, Rfl: 5 .  fluconazole (DIFLUCAN) 150 MG tablet, TAKE 1 TABLET WEEKLY FOR 4 WEEKS, Disp: , Rfl:  .  fluconazole (DIFLUCAN) 200 MG tablet, 1 po q week x 4 weeks, Disp: 12 tablet, Rfl: 3 .  FOLIC ACID PO, Take 1 tablet by mouth daily., Disp: , Rfl:  .  gabapentin (NEURONTIN) 600 MG tablet, TAKE 1 TABLET BY MOUTH THREE TIMES A DAY, Disp: 270 tablet, Rfl: 0 .  HYDROcodone-acetaminophen (NORCO/VICODIN) 5-325 MG tablet, TAKE 1 TABLET BY MOUTH EVERY 4 TO 6 HOURS AS NEEDED FOR PAIN, Disp: , Rfl:  .  ketoconazole (NIZORAL) 2 % cream, Apply 1 application topically daily., Disp: 30 g, Rfl:  0 .  letrozole (FEMARA) 2.5 MG tablet, Take 1 tablet (2.5 mg total) by mouth daily., Disp: 90 tablet, Rfl: 3 .  montelukast (SINGULAIR) 10 MG tablet, Take 1 tablet (10 mg total) by mouth at bedtime., Disp: 90 tablet, Rfl: 3 .  Multiple Vitamin (MULTIVITAMIN) tablet, Take 1 tablet by mouth daily., Disp: , Rfl:  .  olmesartan-hydrochlorothiazide (BENICAR HCT) 20-12.5 MG tablet, Take 1 tablet by mouth daily., Disp: 90 tablet, Rfl: 3 .  olopatadine (PATANOL) 0.1 % ophthalmic solution, Place 1 drop into both eyes 2 (two) times daily., Disp: 15 mL, Rfl: 3 .  Olopatadine HCl (PAZEO) 0.7 % SOLN, Apply 1 drop to eye daily. For itchy eyes, Disp: 2.5 mL, Rfl: 5 .  Omega-3 Fatty Acids (FISH OIL) 1200 MG CAPS, Take 1,200 mg by mouth daily., Disp: , Rfl:  .  Probiotic Product (PROBIOTIC DAILY PO), Take by  mouth., Disp: , Rfl:  .  Tiotropium Bromide Monohydrate (SPIRIVA RESPIMAT) 1.25 MCG/ACT AERS, Inhale 2 puffs into the lungs daily., Disp: 4 g, Rfl: 5 .  traMADol (ULTRAM) 50 MG tablet, Take 1 tablet (50 mg total) by mouth every 6 (six) hours as needed for severe pain., Disp: 120 tablet, Rfl: 5 .  valACYclovir (VALTREX) 500 MG tablet, Take 1 tablet (500 mg total) by mouth 2 (two) times daily., Disp: 20 tablet, Rfl: 5  Social History   Tobacco Use  Smoking Status Former Smoker  . Packs/day: 1.00  . Years: 25.00  . Pack years: 25.00  . Types: Cigarettes  . Quit date: 10/09/2005  . Years since quitting: 13.2  Smokeless Tobacco Never Used    Allergies  Allergen Reactions  . Codeine Shortness Of Breath    Pt reports this was 30 years ago.   . Nsaids   . Lisinopril Cough  . Sulfamethoxazole    Objective:   There were no vitals filed for this visit. There is no height or weight on file to calculate BMI. Constitutional Well developed. Well nourished.  Vascular Dorsalis pedis pulses palpable bilaterally. Posterior tibial pulses palpable bilaterally. Capillary refill normal to all digits.  No cyanosis or clubbing noted. Pedal hair growth normal.  Neurologic Normal speech. Oriented to person, place, and time. Epicritic sensation to light touch grossly present bilaterally.  Dermatologic Nails well groomed and normal in appearance. No open wounds. No skin lesions.  Orthopedic: Point tenderness right 2nd metatarsal base. POP bilateral 3rd interspace Pain distal aspect of the left ankle about the fibula   Radiographs: taken and reviewed progressive healing of the second metatarsal base fracture, avulsion fracture of the left fibula noted Assessment:   1. Avulsion fracture of distal end of fibula   2. Severe ankle sprain, left, initial encounter   3. Displaced fracture of second metatarsal bone, right foot, subsequent encounter for fracture with nonunion   4. Morton's  metatarsalgia, neuralgia, or neuroma, bilateral    Plan:  Patient was evaluated and treated and all questions answered.  Right fracture of metatarsal base, right fourth metatarsal neck -Continue bone stim. -XR reviewed as above.  Avulsion fracture of left fibula -X-ray reviewed with patient -Discussed she can use the bone stim of this area -Immobilized in cam boot  Interdigital Neuroma, bilat -Start sclerosing injection   Procedure: Sclerosing Nerve Injection Location: Bilateral 3rd interspace Skin Prep: Alcohol. Injectate: 1.5 cc 4% sclerosing alcohol injection Disposition: Patient tolerated procedure well. Injection site dressed with a band-aid.    No follow-ups on file.

## 2018-12-23 DIAGNOSIS — M9903 Segmental and somatic dysfunction of lumbar region: Secondary | ICD-10-CM | POA: Diagnosis not present

## 2018-12-23 DIAGNOSIS — M25562 Pain in left knee: Secondary | ICD-10-CM | POA: Diagnosis not present

## 2018-12-23 DIAGNOSIS — M9901 Segmental and somatic dysfunction of cervical region: Secondary | ICD-10-CM | POA: Diagnosis not present

## 2018-12-23 DIAGNOSIS — M545 Low back pain: Secondary | ICD-10-CM | POA: Diagnosis not present

## 2018-12-25 DIAGNOSIS — M9901 Segmental and somatic dysfunction of cervical region: Secondary | ICD-10-CM | POA: Diagnosis not present

## 2018-12-25 DIAGNOSIS — M25562 Pain in left knee: Secondary | ICD-10-CM | POA: Diagnosis not present

## 2018-12-25 DIAGNOSIS — M9903 Segmental and somatic dysfunction of lumbar region: Secondary | ICD-10-CM | POA: Diagnosis not present

## 2018-12-25 DIAGNOSIS — M545 Low back pain: Secondary | ICD-10-CM | POA: Diagnosis not present

## 2018-12-26 ENCOUNTER — Other Ambulatory Visit: Payer: Self-pay

## 2018-12-26 ENCOUNTER — Ambulatory Visit: Payer: BC Managed Care – PPO | Admitting: Podiatry

## 2018-12-26 DIAGNOSIS — G5763 Lesion of plantar nerve, bilateral lower limbs: Secondary | ICD-10-CM | POA: Diagnosis not present

## 2018-12-26 NOTE — Progress Notes (Signed)
Subjective:  Patient ID: Heidi Walls, female    DOB: 1955/12/14,  MRN: [id number]  No chief complaint on file.   63 y.o. female presents with the above complaint. States the foot and ankle are feeling better with use of the bone stim. States the injections help with the nerve burning pain.  Past Medical History:  Diagnosis Date  . Anemia   . Anxiety    OCD  . Arthritis    knees and hips  . Asthma   . Breast cancer ([facility])   . Breast cancer of upper-inner quadrant of left female breast ([facility]) 05/26/2015  . GERD (gastroesophageal reflux disease)   . Hypertension   . Insomnia     Current Outpatient Medications:  .  acetaminophen (TYLENOL) 325 MG tablet, Take 650 mg by mouth 2 (two) times daily. , Disp: , Rfl:  .  albuterol (PROAIR HFA) 108 (90 Base) MCG/ACT inhaler, Inhale 2 puffs into the lungs every 4 (four) hours as needed for wheezing or shortness of breath., Disp: 18 g, Rfl: 1 .  alendronate (FOSAMAX) 70 MG tablet, Take 1 tablet (70 mg total) by mouth every 7 (seven) days. Take with a full glass of water on an empty stomach., Disp: 12 tablet, Rfl: 3 .  ALPRAZolam (XANAX) 0.5 MG tablet, Take 1 tablet (0.5 mg total) by mouth 3 (three) times daily as needed. (Patient taking differently: Take 0.5 mg by mouth daily. ), Disp: 90 tablet, Rfl: 5 .  Azelastine HCl 0.15 % SOLN, One spray each nostril 1-2 times a day as needed, Disp: 30 mL, Rfl: 5 .  B Complex-C (SUPER B COMPLEX PO), Take 1 tablet by mouth daily., Disp: , Rfl:  .  budesonide-formoterol (SYMBICORT) 160-4.5 MCG/ACT inhaler, 2 puffs twice daily with spacer device to prevent coughing or wheezing., Disp: 3 Inhaler, Rfl: 1 .  Calcium Carb-Cholecalciferol (CALCIUM 600 + D PO), Take 1 tablet by mouth daily., Disp: , Rfl:  .  Carbinoxamine Maleate 4 MG TABS, 1 tablet q 8 hours for runny nose or itching, Disp: 120 tablet, Rfl: 5 .  diclofenac sodium (VOLTAREN) 1 % GEL, Apply 4 g topically 4 (four) times daily., Disp: 400 g, Rfl: 11 .   DULoxetine (CYMBALTA) 60 MG capsule, Take 1 capsule (60 mg total) by mouth daily., Disp: 90 capsule, Rfl: 3 .  esomeprazole (NEXIUM) 20 MG capsule, Take 1 capsule (20 mg total) by mouth daily., Disp: 90 capsule, Rfl: 0 .  famotidine (PEPCID) 20 MG tablet, Take 1 tablet (20 mg total) by mouth 2 (two) times daily., Disp: 60 tablet, Rfl: 5 .  fluconazole (DIFLUCAN) 150 MG tablet, TAKE 1 TABLET WEEKLY FOR 4 WEEKS, Disp: , Rfl:  .  fluconazole (DIFLUCAN) 200 MG tablet, 1 po q week x 4 weeks, Disp: 12 tablet, Rfl: 3 .  FOLIC ACID PO, Take 1 tablet by mouth daily., Disp: , Rfl:  .  gabapentin (NEURONTIN) 600 MG tablet, TAKE 1 TABLET BY MOUTH THREE TIMES A DAY, Disp: 270 tablet, Rfl: 0 .  HYDROcodone-acetaminophen (NORCO/VICODIN) 5-325 MG tablet, TAKE 1 TABLET BY MOUTH EVERY 4 TO 6 HOURS AS NEEDED FOR PAIN, Disp: , Rfl:  .  ketoconazole (NIZORAL) 2 % cream, Apply 1 application topically daily., Disp: 30 g, Rfl: 0 .  letrozole (FEMARA) 2.5 MG tablet, Take 1 tablet (2.5 mg total) by mouth daily., Disp: 90 tablet, Rfl: 3 .  montelukast (SINGULAIR) 10 MG tablet, Take 1 tablet (10 mg total) by mouth at bedtime., Disp: 90  tablet, Rfl: 3 .  Multiple Vitamin (MULTIVITAMIN) tablet, Take 1 tablet by mouth daily., Disp: , Rfl:  .  olmesartan-hydrochlorothiazide (BENICAR HCT) 20-12.5 MG tablet, Take 1 tablet by mouth daily., Disp: 90 tablet, Rfl: 3 .  olopatadine (PATANOL) 0.1 % ophthalmic solution, Place 1 drop into both eyes 2 (two) times daily., Disp: 15 mL, Rfl: 3 .  Olopatadine HCl (PAZEO) 0.7 % SOLN, Apply 1 drop to eye daily. For itchy eyes, Disp: 2.5 mL, Rfl: 5 .  Omega-3 Fatty Acids (FISH OIL) 1200 MG CAPS, Take 1,200 mg by mouth daily., Disp: , Rfl:  .  Probiotic Product (PROBIOTIC DAILY PO), Take by mouth., Disp: , Rfl:  .  Tiotropium Bromide Monohydrate (SPIRIVA RESPIMAT) 1.25 MCG/ACT AERS, Inhale 2 puffs into the lungs daily., Disp: 4 g, Rfl: 5 .  traMADol (ULTRAM) 50 MG tablet, Take 1 tablet (50 mg  total) by mouth every 6 (six) hours as needed for severe pain., Disp: 120 tablet, Rfl: 5 .  valACYclovir (VALTREX) 500 MG tablet, Take 1 tablet (500 mg total) by mouth 2 (two) times daily., Disp: 20 tablet, Rfl: 5  Social History   Tobacco Use  Smoking Status Former Smoker  . Packs/day: 1.00  . Years: 25.00  . Pack years: 25.00  . Types: Cigarettes  . Quit date: 10/09/2005  . Years since quitting: 13.2  Smokeless Tobacco Never Used    Allergies  Allergen Reactions  . Codeine Shortness Of Breath    Pt reports this was 30 years ago.   . Nsaids   . Lisinopril Cough  . Sulfamethoxazole    Objective:   There were no vitals filed for this visit. There is no height or weight on file to calculate BMI. Constitutional Well developed. Well nourished.  Vascular Dorsalis pedis pulses palpable bilaterally. Posterior tibial pulses palpable bilaterally. Capillary refill normal to all digits.  No cyanosis or clubbing noted. Pedal hair growth normal.  Neurologic Normal speech. Oriented to person, place, and time. Epicritic sensation to light touch grossly present bilaterally.  Dermatologic Nails well groomed and normal in appearance. No open wounds. No skin lesions.  Orthopedic: Point tenderness right 2nd metatarsal base. POP bilateral 3rd interspace Pain distal aspect of the left ankle about the fibula   Radiographs: None  Assessment:   1. Morton's metatarsalgia, neuralgia, or neuroma, bilateral   2. Avulsion fracture of distal end of fibula   3. Severe ankle sprain, left, initial encounter    Plan:  Patient was evaluated and treated and all questions answered.  Right fracture of metatarsal base, right fourth metatarsal neck -Continue bone stim. -No pain today  Avulsion fracture of left fibula -Continue CAM and bone stimulator  Interdigital Neuroma, bilat -Start sclerosing injection   Procedure: Neurolysis Location: Bilateral 3rd interspace Skin Prep:  Alcohol. Injectate: 4% alcohol sclerosing injection. Disposition: Patient tolerated procedure well. Injection site dressed with a band-aid.  Return in about 2 weeks (around 12/25/2018) for Sclerosing injection f/u, ankle fracture f/u left with XRs .

## 2018-12-28 ENCOUNTER — Other Ambulatory Visit: Payer: Self-pay | Admitting: Physician Assistant

## 2018-12-28 DIAGNOSIS — G629 Polyneuropathy, unspecified: Secondary | ICD-10-CM

## 2018-12-28 DIAGNOSIS — G893 Neoplasm related pain (acute) (chronic): Secondary | ICD-10-CM

## 2018-12-30 ENCOUNTER — Ambulatory Visit (HOSPITAL_BASED_OUTPATIENT_CLINIC_OR_DEPARTMENT_OTHER): Admit: 2018-12-30 | Payer: BC Managed Care – PPO | Admitting: Plastic Surgery

## 2018-12-30 ENCOUNTER — Encounter (HOSPITAL_BASED_OUTPATIENT_CLINIC_OR_DEPARTMENT_OTHER): Payer: Self-pay

## 2018-12-30 DIAGNOSIS — M25562 Pain in left knee: Secondary | ICD-10-CM | POA: Diagnosis not present

## 2018-12-30 DIAGNOSIS — M9901 Segmental and somatic dysfunction of cervical region: Secondary | ICD-10-CM | POA: Diagnosis not present

## 2018-12-30 DIAGNOSIS — M545 Low back pain: Secondary | ICD-10-CM | POA: Diagnosis not present

## 2018-12-30 DIAGNOSIS — M9903 Segmental and somatic dysfunction of lumbar region: Secondary | ICD-10-CM | POA: Diagnosis not present

## 2018-12-30 SURGERY — REPLACEMENT, IMPLANT, BREAST
Anesthesia: General | Site: Chest | Laterality: Right

## 2019-01-05 ENCOUNTER — Telehealth: Payer: Self-pay | Admitting: *Deleted

## 2019-01-05 ENCOUNTER — Other Ambulatory Visit: Payer: Self-pay | Admitting: Physician Assistant

## 2019-01-05 ENCOUNTER — Other Ambulatory Visit: Payer: Self-pay

## 2019-01-05 DIAGNOSIS — G893 Neoplasm related pain (acute) (chronic): Secondary | ICD-10-CM

## 2019-01-05 DIAGNOSIS — G629 Polyneuropathy, unspecified: Secondary | ICD-10-CM

## 2019-01-05 MED ORDER — BUDESONIDE-FORMOTEROL FUMARATE 80-4.5 MCG/ACT IN AERO: 2.0000 | INHALATION_SPRAY | Freq: Two times a day (BID) | RESPIRATORY_TRACT | 5 refills | Status: DC

## 2019-01-05 MED ORDER — TRAMADOL HCL 50 MG PO TABS: 50.0000 mg | ORAL_TABLET | Freq: Four times a day (QID) | ORAL | 0 refills | Status: DC | PRN

## 2019-01-05 NOTE — Telephone Encounter (Signed)
Patient called stating that she was prescribed both Spiriva and Symbicort. She was using the Symbicort which she thinks works well. However, it causes bad thrush, even when rinsing her mouth out with mouth wash and water after each use. She switched to Spiriva and it makes her cough really bad and she feels as though it's not helping. Patient really does not want to switch back to Symbicort but would like advice on what to do. Pharmacy is [facility] on [address].

## 2019-01-05 NOTE — Telephone Encounter (Signed)
Called patient and let her know of Dr.Bobbitt's instructions. Patient voiced understanding. Called in RX for Symbicort 80.

## 2019-01-05 NOTE — Telephone Encounter (Signed)
The patient had previously been on Symbicort 160 g.  Let us try Symbicort 80 g, 2 inhalations twice daily.  Please ensure that she is using a spacer device and using it correctly.  In addition, she should take the Symbicort in the morning and in the evening prior to brushing her teeth and rinsing her mouth. Thank you.

## 2019-01-06 DIAGNOSIS — M9903 Segmental and somatic dysfunction of lumbar region: Secondary | ICD-10-CM | POA: Diagnosis not present

## 2019-01-06 DIAGNOSIS — M545 Low back pain: Secondary | ICD-10-CM | POA: Diagnosis not present

## 2019-01-06 DIAGNOSIS — M9901 Segmental and somatic dysfunction of cervical region: Secondary | ICD-10-CM | POA: Diagnosis not present

## 2019-01-06 DIAGNOSIS — M25562 Pain in left knee: Secondary | ICD-10-CM | POA: Diagnosis not present

## 2019-01-07 ENCOUNTER — Ambulatory Visit: Payer: BC Managed Care – PPO | Admitting: Family Medicine

## 2019-01-08 ENCOUNTER — Other Ambulatory Visit: Payer: Self-pay

## 2019-01-08 ENCOUNTER — Ambulatory Visit (INDEPENDENT_AMBULATORY_CARE_PROVIDER_SITE_OTHER): Payer: BC Managed Care – PPO | Admitting: Podiatry

## 2019-01-08 DIAGNOSIS — G5763 Lesion of plantar nerve, bilateral lower limbs: Secondary | ICD-10-CM

## 2019-01-08 NOTE — Progress Notes (Signed)
Subjective:  Patient ID: Heidi Walls, female    DOB: 06/16/1955,  MRN: [id number]  Chief Complaint  Patient presents with  . Follow-up    sclerosing injection , bilateral. pt states that she is doing good and that the injection helped a lot     63 y.o. female presents with the above complaint. States the injections are helping a lot and getting less painful after each injection.  Past Medical History:  Diagnosis Date  . Anemia   . Anxiety    OCD  . Arthritis    knees and hips  . Asthma   . Breast cancer ([place])   . Breast cancer of upper-inner quadrant of left female breast ([place]) 05/26/2015  . GERD (gastroesophageal reflux disease)   . Hypertension   . Insomnia     Current Outpatient Medications:  .  acetaminophen (TYLENOL) 325 MG tablet, Take 650 mg by mouth 2 (two) times daily. , Disp: , Rfl:  .  albuterol (PROAIR HFA) 108 (90 Base) MCG/ACT inhaler, Inhale 2 puffs into the lungs every 4 (four) hours as needed for wheezing or shortness of breath., Disp: 18 g, Rfl: 1 .  alendronate (FOSAMAX) 70 MG tablet, Take 1 tablet (70 mg total) by mouth every 7 (seven) days. Take with a full glass of water on an empty stomach., Disp: 12 tablet, Rfl: 3 .  ALPRAZolam (XANAX) 0.5 MG tablet, Take 1 tablet (0.5 mg total) by mouth 3 (three) times daily as needed. (Patient taking differently: Take 0.5 mg by mouth daily. ), Disp: 90 tablet, Rfl: 5 .  Azelastine HCl 0.15 % SOLN, One spray each nostril 1-2 times a day as needed, Disp: 30 mL, Rfl: 5 .  B Complex-C (SUPER B COMPLEX PO), Take 1 tablet by mouth daily., Disp: , Rfl:  .  budesonide-formoterol (SYMBICORT) 160-4.5 MCG/ACT inhaler, 2 puffs twice daily with spacer device to prevent coughing or wheezing., Disp: 3 Inhaler, Rfl: 1 .  budesonide-formoterol (SYMBICORT) 80-4.5 MCG/ACT inhaler, Inhale 2 puffs into the lungs 2 (two) times daily., Disp: 1 Inhaler, Rfl: 5 .  Calcium Carb-Cholecalciferol (CALCIUM 600 + D PO), Take 1 tablet by mouth daily.,  Disp: , Rfl:  .  Carbinoxamine Maleate 4 MG TABS, 1 tablet q 8 hours for runny nose or itching, Disp: 120 tablet, Rfl: 5 .  diclofenac sodium (VOLTAREN) 1 % GEL, Apply 4 g topically 4 (four) times daily., Disp: 400 g, Rfl: 11 .  DULoxetine (CYMBALTA) 60 MG capsule, Take 1 capsule (60 mg total) by mouth daily., Disp: 90 capsule, Rfl: 3 .  esomeprazole (NEXIUM) 20 MG capsule, Take 1 capsule (20 mg total) by mouth daily., Disp: 90 capsule, Rfl: 0 .  famotidine (PEPCID) 20 MG tablet, Take 1 tablet (20 mg total) by mouth 2 (two) times daily., Disp: 60 tablet, Rfl: 5 .  fluconazole (DIFLUCAN) 150 MG tablet, TAKE 1 TABLET WEEKLY FOR 4 WEEKS, Disp: , Rfl:  .  fluconazole (DIFLUCAN) 200 MG tablet, 1 po q week x 4 weeks, Disp: 12 tablet, Rfl: 3 .  FOLIC ACID PO, Take 1 tablet by mouth daily., Disp: , Rfl:  .  gabapentin (NEURONTIN) 600 MG tablet, TAKE 1 TABLET BY MOUTH THREE TIMES A DAY, Disp: 270 tablet, Rfl: 0 .  ketoconazole (NIZORAL) 2 % cream, Apply 1 application topically daily., Disp: 30 g, Rfl: 0 .  letrozole (FEMARA) 2.5 MG tablet, Take 1 tablet (2.5 mg total) by mouth daily., Disp: 90 tablet, Rfl: 3 .  montelukast (SINGULAIR)  10 MG tablet, Take 1 tablet (10 mg total) by mouth at bedtime., Disp: 90 tablet, Rfl: 3 .  Multiple Vitamin (MULTIVITAMIN) tablet, Take 1 tablet by mouth daily., Disp: , Rfl:  .  olmesartan-hydrochlorothiazide (BENICAR HCT) 20-12.5 MG tablet, Take 1 tablet by mouth daily., Disp: 90 tablet, Rfl: 3 .  olopatadine (PATANOL) 0.1 % ophthalmic solution, Place 1 drop into both eyes 2 (two) times daily., Disp: 15 mL, Rfl: 3 .  Olopatadine HCl (PAZEO) 0.7 % SOLN, Apply 1 drop to eye daily. For itchy eyes, Disp: 2.5 mL, Rfl: 5 .  Omega-3 Fatty Acids (FISH OIL) 1200 MG CAPS, Take 1,200 mg by mouth daily., Disp: , Rfl:  .  Probiotic Product (PROBIOTIC DAILY PO), Take by mouth., Disp: , Rfl:  .  Tiotropium Bromide Monohydrate (SPIRIVA RESPIMAT) 1.25 MCG/ACT AERS, Inhale 2 puffs into the  lungs daily., Disp: 4 g, Rfl: 5 .  traMADol (ULTRAM) 50 MG tablet, Take 1 tablet (50 mg total) by mouth every 6 (six) hours as needed for severe pain., Disp: 120 tablet, Rfl: 0 .  valACYclovir (VALTREX) 500 MG tablet, Take 1 tablet (500 mg total) by mouth 2 (two) times daily., Disp: 20 tablet, Rfl: 5  Social History   Tobacco Use  Smoking Status Former Smoker  . Packs/day: 1.00  . Years: 25.00  . Pack years: 25.00  . Types: Cigarettes  . Quit date: 10/09/2005  . Years since quitting: 13.2  Smokeless Tobacco Never Used    Allergies  Allergen Reactions  . Codeine Shortness Of Breath    Pt reports this was 30 years ago.   . Nsaids   . Lisinopril Cough  . Sulfamethoxazole    Objective:   There were no vitals filed for this visit. There is no height or weight on file to calculate BMI. Constitutional Well developed. Well nourished.  Vascular Dorsalis pedis pulses palpable bilaterally. Posterior tibial pulses palpable bilaterally. Capillary refill normal to all digits.  No cyanosis or clubbing noted. Pedal hair growth normal.  Neurologic Normal speech. Oriented to person, place, and time. Epicritic sensation to light touch grossly present bilaterally.  Dermatologic Nails well groomed and normal in appearance. No open wounds. No skin lesions.  Orthopedic: Point tenderness right 2nd metatarsal base. POP bilateral 3rd interspace Pain distal aspect of the left ankle about the fibula   Radiographs: None  Assessment:   1. Morton's metatarsalgia, neuralgia, or neuroma, bilateral    Plan:  Patient was evaluated and treated and all questions answered.  Right fracture of metatarsal base, right fourth metatarsal neck -Continue bone stim.  Avulsion fracture of left fibula -Continue CAM and bone stimulator  Interdigital Neuroma, bilat -Sclerosinginjection #3 as below  Procedure: Neurolysis Location: Bilateral 3rd interspace Skin Prep: Alcohol. Injectate: 4% alcohol  sclerosing injection. Disposition: Patient tolerated procedure well. Injection site dressed with a band-aid.   Return in about 2 weeks (around 01/22/2019) for Sclerosing injection f/u .

## 2019-01-11 NOTE — Progress Notes (Signed)
Subjective:  Patient ID: Heidi Walls, female    DOB: [birth year],  MRN: [id number]  Chief Complaint  Patient presents with  . Foot Injury    Pt states doing alright but occasionally has pain "when I move wrong"  . Neuroma    Pt states bilateral tenderness post-injection with left being more tender than the right.    64 y.o. female presents with the above complaint.  History above confirmed with patient  Past Medical History:  Diagnosis Date  . Anemia   . Anxiety    OCD  . Arthritis    knees and hips  . Asthma   . Breast cancer ([place])   . Breast cancer of upper-inner quadrant of left female breast ([place]) 05/26/2015  . GERD (gastroesophageal reflux disease)   . Hypertension   . Insomnia     Current Outpatient Medications:  .  acetaminophen (TYLENOL) 325 MG tablet, Take 650 mg by mouth 2 (two) times daily. , Disp: , Rfl:  .  albuterol (PROAIR HFA) 108 (90 Base) MCG/ACT inhaler, Inhale 2 puffs into the lungs every 4 (four) hours as needed for wheezing or shortness of breath., Disp: 18 g, Rfl: 1 .  alendronate (FOSAMAX) 70 MG tablet, Take 1 tablet (70 mg total) by mouth every 7 (seven) days. Take with a full glass of water on an empty stomach., Disp: 12 tablet, Rfl: 3 .  ALPRAZolam (XANAX) 0.5 MG tablet, Take 1 tablet (0.5 mg total) by mouth 3 (three) times daily as needed. (Patient taking differently: Take 0.5 mg by mouth daily. ), Disp: 90 tablet, Rfl: 5 .  Azelastine HCl 0.15 % SOLN, One spray each nostril 1-2 times a day as needed, Disp: 30 mL, Rfl: 5 .  B Complex-C (SUPER B COMPLEX PO), Take 1 tablet by mouth daily., Disp: , Rfl:  .  budesonide-formoterol (SYMBICORT) 160-4.5 MCG/ACT inhaler, 2 puffs twice daily with spacer device to prevent coughing or wheezing., Disp: 3 Inhaler, Rfl: 1 .  Calcium Carb-Cholecalciferol (CALCIUM 600 + D PO), Take 1 tablet by mouth daily., Disp: , Rfl:  .  Carbinoxamine Maleate 4 MG TABS, 1 tablet q 8 hours for runny nose or itching, Disp: 120  tablet, Rfl: 5 .  diclofenac sodium (VOLTAREN) 1 % GEL, Apply 4 g topically 4 (four) times daily., Disp: 400 g, Rfl: 11 .  DULoxetine (CYMBALTA) 60 MG capsule, Take 1 capsule (60 mg total) by mouth daily., Disp: 90 capsule, Rfl: 3 .  esomeprazole (NEXIUM) 20 MG capsule, Take 1 capsule (20 mg total) by mouth daily., Disp: 90 capsule, Rfl: 0 .  famotidine (PEPCID) 20 MG tablet, Take 1 tablet (20 mg total) by mouth 2 (two) times daily., Disp: 60 tablet, Rfl: 5 .  fluconazole (DIFLUCAN) 150 MG tablet, TAKE 1 TABLET WEEKLY FOR 4 WEEKS, Disp: , Rfl:  .  fluconazole (DIFLUCAN) 200 MG tablet, 1 po q week x 4 weeks, Disp: 12 tablet, Rfl: 3 .  FOLIC ACID PO, Take 1 tablet by mouth daily., Disp: , Rfl:  .  gabapentin (NEURONTIN) 600 MG tablet, TAKE 1 TABLET BY MOUTH THREE TIMES A DAY, Disp: 270 tablet, Rfl: 0 .  ketoconazole (NIZORAL) 2 % cream, Apply 1 application topically daily., Disp: 30 g, Rfl: 0 .  letrozole (FEMARA) 2.5 MG tablet, Take 1 tablet (2.5 mg total) by mouth daily., Disp: 90 tablet, Rfl: 3 .  montelukast (SINGULAIR) 10 MG tablet, Take 1 tablet (10 mg total) by mouth at bedtime., Disp: 90 tablet, Rfl:  3 .  Multiple Vitamin (MULTIVITAMIN) tablet, Take 1 tablet by mouth daily., Disp: , Rfl:  .  olmesartan-hydrochlorothiazide (BENICAR HCT) 20-12.5 MG tablet, Take 1 tablet by mouth daily., Disp: 90 tablet, Rfl: 3 .  olopatadine (PATANOL) 0.1 % ophthalmic solution, Place 1 drop into both eyes 2 (two) times daily., Disp: 15 mL, Rfl: 3 .  Olopatadine HCl (PAZEO) 0.7 % SOLN, Apply 1 drop to eye daily. For itchy eyes, Disp: 2.5 mL, Rfl: 5 .  Omega-3 Fatty Acids (FISH OIL) 1200 MG CAPS, Take 1,200 mg by mouth daily., Disp: , Rfl:  .  Probiotic Product (PROBIOTIC DAILY PO), Take by mouth., Disp: , Rfl:  .  Tiotropium Bromide Monohydrate (SPIRIVA RESPIMAT) 1.25 MCG/ACT AERS, Inhale 2 puffs into the lungs daily., Disp: 4 g, Rfl: 5 .  valACYclovir (VALTREX) 500 MG tablet, Take 1 tablet (500 mg total) by  mouth 2 (two) times daily., Disp: 20 tablet, Rfl: 5 .  budesonide-formoterol (SYMBICORT) 80-4.5 MCG/ACT inhaler, Inhale 2 puffs into the lungs 2 (two) times daily., Disp: 1 Inhaler, Rfl: 5 .  traMADol (ULTRAM) 50 MG tablet, Take 1 tablet (50 mg total) by mouth every 6 (six) hours as needed for severe pain., Disp: 120 tablet, Rfl: 0  Social History   Tobacco Use  Smoking Status Former Smoker  . Packs/day: 1.00  . Years: 25.00  . Pack years: 25.00  . Types: Cigarettes  . Quit date: 10/09/2005  . Years since quitting: 13.2  Smokeless Tobacco Never Used    Allergies  Allergen Reactions  . Codeine Shortness Of Breath    Pt reports this was 30 years ago.   . Nsaids   . Lisinopril Cough  . Sulfamethoxazole    Objective:   There were no vitals filed for this visit. There is no height or weight on file to calculate BMI. Constitutional Well developed. Well nourished.  Vascular Dorsalis pedis pulses palpable bilaterally. Posterior tibial pulses palpable bilaterally. Capillary refill normal to all digits.  No cyanosis or clubbing noted. Pedal hair growth normal.  Neurologic Normal speech. Oriented to person, place, and time. Epicritic sensation to light touch grossly present bilaterally.  Dermatologic Nails well groomed and normal in appearance. No open wounds. No skin lesions.  Orthopedic: Point tenderness right 2nd metatarsal base. POP bilateral 3rd interspace Pain distal aspect of the left ankle about the fibula   Radiographs: None  Assessment:   1. Morton's metatarsalgia, neuralgia, or neuroma, bilateral    Plan:  Patient was evaluated and treated and all questions answered.  Right fracture of metatarsal base, right fourth metatarsal neck -Continue bone stim. -No pain today  Avulsion fracture of left fibula -Continue CAM and bone stimulator  Interdigital Neuroma, bilat -sclerosing injection #2  Procedure: Neurolysis Location: Bilateral 3rd interspace Skin  Prep: Alcohol. Injectate: 4% alcohol sclerosing injection. Disposition: Patient tolerated procedure well. Injection site dressed with a band-aid.   Return in about 2 weeks (around 01/09/2019) for sclerosing injection bilat.

## 2019-01-12 DIAGNOSIS — M9903 Segmental and somatic dysfunction of lumbar region: Secondary | ICD-10-CM | POA: Diagnosis not present

## 2019-01-12 DIAGNOSIS — M25562 Pain in left knee: Secondary | ICD-10-CM | POA: Diagnosis not present

## 2019-01-12 DIAGNOSIS — M545 Low back pain: Secondary | ICD-10-CM | POA: Diagnosis not present

## 2019-01-12 DIAGNOSIS — M9901 Segmental and somatic dysfunction of cervical region: Secondary | ICD-10-CM | POA: Diagnosis not present

## 2019-01-14 ENCOUNTER — Ambulatory Visit: Payer: BC Managed Care – PPO | Admitting: Orthopaedic Surgery

## 2019-01-14 ENCOUNTER — Other Ambulatory Visit: Payer: Self-pay

## 2019-01-14 ENCOUNTER — Encounter: Payer: Self-pay | Admitting: Orthopaedic Surgery

## 2019-01-14 VITALS — Ht 62.5 in | Wt 160.0 lb

## 2019-01-14 DIAGNOSIS — G8929 Other chronic pain: Secondary | ICD-10-CM

## 2019-01-14 DIAGNOSIS — M25561 Pain in right knee: Secondary | ICD-10-CM

## 2019-01-14 DIAGNOSIS — M1711 Unilateral primary osteoarthritis, right knee: Secondary | ICD-10-CM | POA: Diagnosis not present

## 2019-01-14 DIAGNOSIS — M25562 Pain in left knee: Secondary | ICD-10-CM | POA: Diagnosis not present

## 2019-01-14 DIAGNOSIS — M9903 Segmental and somatic dysfunction of lumbar region: Secondary | ICD-10-CM | POA: Diagnosis not present

## 2019-01-14 DIAGNOSIS — M9901 Segmental and somatic dysfunction of cervical region: Secondary | ICD-10-CM | POA: Diagnosis not present

## 2019-01-14 DIAGNOSIS — M545 Low back pain: Secondary | ICD-10-CM | POA: Diagnosis not present

## 2019-01-14 NOTE — Progress Notes (Signed)
Office Visit Note   Patient: Heidi Walls           Date of Birth: Aug 26, 1955           MRN: [id number] Visit Date: 01/14/2019              Requested by: Terald Sleeper, PA-C [address] [place],  [place] [address] PCP: Terald Sleeper, PA-C   Assessment & Plan: Visit Diagnoses:  1. Chronic pain of right knee   2. Unilateral primary osteoarthritis, right knee     Plan: Given the failure of conservative treatment I do feel it is worthwhile obtaining an MRI of her right knee so we can better assess the cartilage that then make a better determination of what the best treatment modalities will be for her to decrease her knee pain.  Given the fact that she is having mechanical symptoms with locking and catching and with her plain films showing still a well-maintained joint space I feel an MRI is warranted to assess for meniscal tear as well as assistive medial compartment cartilage.  She will get a follow-up appointment with [place] when she knows when she is having an MRI.  Follow-Up Instructions: No follow-ups on file.   Orders:  No orders of the defined types were placed in this encounter.  No orders of the defined types were placed in this encounter.     Procedures: No procedures performed   Clinical Data: No additional findings.   Subjective: Chief Complaint  Patient presents with  . Right Knee - Pain  The patient is a very pleasant and active 64 year old who comes in with chronic right knee pain.  She actually reports that arthroscopic intervention was performed on that right knee about 20 years ago.  This was for a meniscal tear.  She is a very active individual and does swim during the warmer months.  She reports medial knee pain.  She points to the medial joint line and the posterior medial aspect of her knee as a source of her pain.  Hurts with weightbearing activities.  It hurts with pivoting.  She does get locking catching in that knee.  She also has grinding at the  patellofemoral joint with popping in her knee she states.  She has had numerous steroid injections in her knee at this point is interested in any other modalities that can help her knee.  HPI  Review of Systems She currently denies any headache, chest pain, shortness of breath, fever, chills, nausea, vomiting  Objective: Vital Signs: Ht 5' 2.5" (1.588 m)   Wt 160 lb (72.6 kg)   BMI 28.80 kg/m   Physical Exam She is alert and oriented x3 and in no acute distress Ortho Exam Examination of her right knee does show neutral alignment.  She slightly hyperextends.  Her pain is posterior medially.  She does have a positive McMurray's exam to the medial compartment of her knee.  Her Lachman's exam is negative.  She has significant patellofemoral crepitation. Specialty Comments:  No specialty comments available.  Imaging: No results found. X-rays independently reviewed of her right knee shows significant patellofemoral arthritic changes.  However the medial lateral joint lines are still well-maintained.  There is a slight small osteophyte off of the medial femoral condyle.  PMFS History: Patient Active Problem List   Diagnosis Date Noted  . Oral candidiasis 09/11/2018  . Bilateral hip pain 06/19/2018  . Pain of both hip joints 06/19/2018  . Chronic left-sided  low back pain with left-sided sciatica 06/19/2018  . Elevated cholesterol 01/15/2018  . Arthritis of knee, left 07/02/2017  . Arthritis of right knee 01/29/2017  . Baker's cyst of knee, left 01/02/2017  . Perennial allergic rhinitis with a probable nonallergic component 12/12/2016  . Neuropathy 11/19/2016  . Chronic pain 11/19/2016  . Dysphonia 10/03/2016  . Vocal cord atrophy 10/03/2016  . Moderate persistent asthma 07/10/2016  . Bronchitis 07/10/2016  . Panic attack 10/23/2015  . Anemia 10/22/2015  . Chemotherapy induced diarrhea 10/17/2015  . Osteoporosis 09/07/2015  . Generalized anxiety disorder 09/07/2015  .  Osteoarthritis of right knee 09/07/2015  . Gastroesophageal reflux disease 09/07/2015  . Depression 09/07/2015  . Acquired absence of left breast and nipple 06/30/2015  . Breast cancer of upper-inner quadrant of left female breast ([place]) 05/26/2015   Past Medical History:  Diagnosis Date  . Anemia   . Anxiety    OCD  . Arthritis    knees and hips  . Asthma   . Breast cancer ([place])   . Breast cancer of upper-inner quadrant of left female breast ([place]) 05/26/2015  . GERD (gastroesophageal reflux disease)   . Hypertension   . Insomnia     Family History  Problem Relation Age of Onset  . Heart failure Mother   . Diabetes Mother   . Hypertension Mother   . Hypertension Father   . Breast cancer Maternal Grandmother   . Allergic rhinitis Sister   . Sinusitis Sister   . Allergic rhinitis Daughter   . Allergic rhinitis Son   . Sinusitis Son   . Bronchitis Grandchild   . Asthma Neg Hx   . Eczema Neg Hx   . Urticaria Neg Hx   . Immunodeficiency Neg Hx   . Angioedema Neg Hx     Past Surgical History:  Procedure Laterality Date  . BREAST RECONSTRUCTION WITH PLACEMENT OF TISSUE EXPANDER AND FLEX HD (ACELLULAR HYDRATED DERMIS) Left 06/24/2015   Procedure: LEFT BREAST RECONSTRUCTION WITH PLACEMENT OF TISSUE EXPANDER AND  ACELLULAR HYDRATED DERMIS (CORTIVA);  Surgeon: Irene Limbo, MD;  Location: [facility];  Service: Plastics;  Laterality: Left;  . KNEE SURGERY Right 2000   arthroscopy  . LIPOSUCTION WITH LIPOFILLING Left 12/23/2015   Procedure: LIPOFILLING TO LEFT CHEST FROM ABDOMEN  ;  Surgeon: Irene Limbo, MD;  Location: [facility];  Service: Plastics;  Laterality: Left;  Marland Kitchen MASTECTOMY W/ SENTINEL NODE BIOPSY Left 06/24/2015   Procedure: LEFT TOTAL MASTECTOMY WITH SENTINEL LYMPH NODE BIOPSY;  Surgeon: Excell Seltzer, MD;  Location: [facility];  Service: General;  Laterality: Left;  Marland Kitchen MASTOPEXY Right 12/23/2015   Procedure: RIGHT  BREAST MASTOPEXY;  Surgeon: Irene Limbo, MD;  Location: [facility];  Service: Plastics;  Laterality: Right;  . MICROLARYNGOSCOPY W/VOCAL CORD INJECTION N/A 10/15/2016   Procedure: SUSPENDED DIRECT MICROLARYNGOSCOPY WITH VOCAL CORD INJECTION WITH JET VENTILATION;  Surgeon: Melida Quitter, MD;  Location: [facility];  Service: ENT;  Laterality: N/A;  . PORTACATH PLACEMENT Right 06/24/2015   Procedure: INSERTION PORT-A-CATH;  Surgeon: Excell Seltzer, MD;  Location: [facility];  Service: General;  Laterality: Right;  . portacath removal  09/05/2016  . REMOVAL OF BILATERAL TISSUE EXPANDERS WITH PLACEMENT OF BILATERAL BREAST IMPLANTS Left 12/23/2015   Procedure: REMOVAL OF LEFT  TISSUE EXPANDERS WITH PLACEMENT OF LEFT  BREAST IMPLANTS;  Surgeon: Irene Limbo, MD;  Location: [facility];  Service: Plastics;  Laterality: Left;  . TONSILLECTOMY  Social History   Occupational History  . Not on file  Tobacco Use  . Smoking status: Former Smoker    Packs/day: 1.00    Years: 25.00    Pack years: 25.00    Types: Cigarettes    Quit date: 10/09/2005    Years since quitting: 13.2  . Smokeless tobacco: Never Used  Substance and Sexual Activity  . Alcohol use: No  . Drug use: No  . Sexual activity: Not on file

## 2019-01-19 ENCOUNTER — Telehealth: Payer: Self-pay | Admitting: Orthopaedic Surgery

## 2019-01-19 NOTE — Telephone Encounter (Signed)
Called patient left voicemail message to schedule an MRI review with Dr Ninfa Linden or Artis Delay    MRI scheduled 01/24/2019

## 2019-01-20 ENCOUNTER — Telehealth: Payer: Self-pay | Admitting: Orthopaedic Surgery

## 2019-01-20 NOTE — Telephone Encounter (Signed)
Called patient left voicemail message to return call to schedule an appointment with Dr Blackman or Gil for MRI review 

## 2019-01-21 ENCOUNTER — Telehealth: Payer: Self-pay | Admitting: Orthopaedic Surgery

## 2019-01-21 NOTE — Telephone Encounter (Signed)
Called patient left voice mail message to return call

## 2019-01-22 ENCOUNTER — Ambulatory Visit: Payer: BC Managed Care – PPO | Admitting: Podiatry

## 2019-01-22 DIAGNOSIS — M9901 Segmental and somatic dysfunction of cervical region: Secondary | ICD-10-CM | POA: Diagnosis not present

## 2019-01-22 DIAGNOSIS — M9903 Segmental and somatic dysfunction of lumbar region: Secondary | ICD-10-CM | POA: Diagnosis not present

## 2019-01-22 DIAGNOSIS — M545 Low back pain: Secondary | ICD-10-CM | POA: Diagnosis not present

## 2019-01-22 DIAGNOSIS — M25562 Pain in left knee: Secondary | ICD-10-CM | POA: Diagnosis not present

## 2019-01-23 ENCOUNTER — Ambulatory Visit (INDEPENDENT_AMBULATORY_CARE_PROVIDER_SITE_OTHER): Payer: BC Managed Care – PPO | Admitting: Podiatry

## 2019-01-23 ENCOUNTER — Other Ambulatory Visit: Payer: Self-pay

## 2019-01-23 DIAGNOSIS — G5763 Lesion of plantar nerve, bilateral lower limbs: Secondary | ICD-10-CM

## 2019-01-23 NOTE — Progress Notes (Signed)
  Subjective:  Patient ID: Heidi Walls, female    DOB: 10-Mar-1955,  MRN: [id number]  Chief Complaint  Patient presents with  . Neuroma    Pt states sclerosing injections have been very effective. No new concerns.     64 y.o. female presents with the above complaint. History confirmed with patient. Hx as above.  Objective:  Physical Exam: warm, good capillary refill, no trophic changes or ulcerative lesions, normal DP and PT pulses and normal sensory exam. Bilat Foot: tenderness between the 3rd and 4th metatarsal head    Assessment:   1. Morton's metatarsalgia, neuralgia, or neuroma, bilateral      Plan:  Patient was evaluated and treated and all questions answered.  Morton Neuroma -Injection delivered to the affected interspaces. Sclerosing injection #4  Procedure: Sclerosing Nerve Injection Location: Bilateral 3rd interspace Skin Prep: Alcohol. Injectate: 1.5 cc 4% sclerosing alcohol injection Disposition: Patient tolerated procedure well. Injection site dressed with a band-aid.   Return in about 2 weeks (around 02/06/2019) for Sclerosing injection.

## 2019-01-24 ENCOUNTER — Ambulatory Visit
Admission: RE | Admit: 2019-01-24 | Discharge: 2019-01-24 | Disposition: A | Discharge status: Home / Self Care | Payer: BC Managed Care – PPO | Source: Ambulatory Visit | Attending: Orthopaedic Surgery | Admitting: Orthopaedic Surgery

## 2019-01-24 DIAGNOSIS — G8929 Other chronic pain: Secondary | ICD-10-CM

## 2019-01-24 DIAGNOSIS — M25461 Effusion, right knee: Secondary | ICD-10-CM | POA: Diagnosis not present

## 2019-01-24 DIAGNOSIS — M1711 Unilateral primary osteoarthritis, right knee: Secondary | ICD-10-CM

## 2019-01-26 DIAGNOSIS — M25562 Pain in left knee: Secondary | ICD-10-CM | POA: Diagnosis not present

## 2019-01-26 DIAGNOSIS — M9901 Segmental and somatic dysfunction of cervical region: Secondary | ICD-10-CM | POA: Diagnosis not present

## 2019-01-26 DIAGNOSIS — M545 Low back pain: Secondary | ICD-10-CM | POA: Diagnosis not present

## 2019-01-26 DIAGNOSIS — M9903 Segmental and somatic dysfunction of lumbar region: Secondary | ICD-10-CM | POA: Diagnosis not present

## 2019-01-27 ENCOUNTER — Other Ambulatory Visit: Payer: Self-pay

## 2019-01-27 ENCOUNTER — Ambulatory Visit: Payer: BC Managed Care – PPO | Admitting: Physician Assistant

## 2019-01-27 ENCOUNTER — Telehealth: Payer: Self-pay | Admitting: Radiology

## 2019-01-27 ENCOUNTER — Encounter: Payer: Self-pay | Admitting: Physician Assistant

## 2019-01-27 DIAGNOSIS — M1711 Unilateral primary osteoarthritis, right knee: Secondary | ICD-10-CM | POA: Diagnosis not present

## 2019-01-27 MED ORDER — METHYLPREDNISOLONE ACETATE 40 MG/ML IJ SUSP
40.0000 mg | INTRAMUSCULAR | Status: AC | PRN
  Administered 2019-01-27: 14:00:00 40 mg via INTRA_ARTICULAR

## 2019-01-27 MED ORDER — LIDOCAINE HCL 1 % IJ SOLN
3.0000 mL | INTRAMUSCULAR | Status: AC | PRN
  Administered 2019-01-27: 3 mL

## 2019-01-27 NOTE — Telephone Encounter (Signed)
Submit for VOB.  Thank you

## 2019-01-27 NOTE — Telephone Encounter (Signed)
Request supplemental injection right knee

## 2019-01-27 NOTE — Progress Notes (Signed)
   Procedure Note  Patient: Heidi Walls             Date of Birth: 06-28-1955           MRN: [id number]             Visit Date: 01/27/2019  HPI: Mrs. Marcoe comes in today follow-up status post right knee MRI.  She continues to have pain in the right knee medial aspect.  She also continues to have a locking catching-like sensation in the knee. Right knee MRI images from 01/24/2019 are reviewed with the patient.  Right knee was found to have tricompartmental changes with moderate lateral and medial compartmental changes.  Patellofemoral joint with had advanced degenerative changes with full-thickness cartilage loss and joint space narrowing.  Medial meniscus with some degenerative changes but no discrete tear.  Lateral meniscus mild degenerative changes but no discrete tear.  Physical exam: Right knee full extension full flexion.  Tenderness along the medial joint line.  Significant patellofemoral crepitus.  Procedures: Visit Diagnoses:  1. Primary osteoarthritis of right knee     Large Joint Inj on 01/27/2019 1:38 PM Indications: pain Details: 22 G 1.5 in needle, anterolateral approach  Arthrogram: No  Medications: 3 mL lidocaine 1 %; 40 mg methylPREDNISolone acetate 40 MG/ML Outcome: tolerated well, no immediate complications Procedure, treatment alternatives, risks and benefits explained, specific risks discussed. Consent was given by the patient. Immediately prior to procedure a time out was called to verify the correct patient, procedure, equipment, support staff and site/side marked as required. Patient was prepped and draped in the usual sterile fashion.     Plan: We will have her back for supplemental injection in the future once approved.  Handout on Synvisc 1 was given.  She would like to try all conservative treatment prior to the considering any type of surgical intervention.  She understands that surgical intervention would be a total knee arthroplasty if she fails all  conservative treatment.

## 2019-01-28 ENCOUNTER — Telehealth: Payer: Self-pay

## 2019-01-28 DIAGNOSIS — M9903 Segmental and somatic dysfunction of lumbar region: Secondary | ICD-10-CM | POA: Diagnosis not present

## 2019-01-28 DIAGNOSIS — M25562 Pain in left knee: Secondary | ICD-10-CM | POA: Diagnosis not present

## 2019-01-28 DIAGNOSIS — M9901 Segmental and somatic dysfunction of cervical region: Secondary | ICD-10-CM | POA: Diagnosis not present

## 2019-01-28 DIAGNOSIS — M545 Low back pain: Secondary | ICD-10-CM | POA: Diagnosis not present

## 2019-01-28 NOTE — Telephone Encounter (Signed)
NOTED

## 2019-01-28 NOTE — Telephone Encounter (Signed)
Submitted for VOB for right knee synvisc one injection

## 2019-01-29 ENCOUNTER — Telehealth: Payer: Self-pay

## 2019-01-29 NOTE — Telephone Encounter (Signed)
Approved for Synvisc one right knee Dr. Margarito Liner and bill $140 copay required PA required Approval ref # [id number] Auth from 01/28/19-01/28/20

## 2019-01-29 NOTE — Telephone Encounter (Signed)
Lvm for pt to call back to schedule appointment for synvisc one injection right knee   See prior note..will have $140 copay

## 2019-02-02 ENCOUNTER — Other Ambulatory Visit: Payer: Self-pay

## 2019-02-03 ENCOUNTER — Ambulatory Visit (INDEPENDENT_AMBULATORY_CARE_PROVIDER_SITE_OTHER): Payer: BC Managed Care – PPO | Admitting: Physician Assistant

## 2019-02-03 ENCOUNTER — Telehealth: Payer: Self-pay

## 2019-02-03 ENCOUNTER — Encounter: Payer: Self-pay | Admitting: Physician Assistant

## 2019-02-03 DIAGNOSIS — E7800 Pure hypercholesterolemia, unspecified: Secondary | ICD-10-CM

## 2019-02-03 DIAGNOSIS — F411 Generalized anxiety disorder: Secondary | ICD-10-CM

## 2019-02-03 DIAGNOSIS — G893 Neoplasm related pain (acute) (chronic): Secondary | ICD-10-CM

## 2019-02-03 DIAGNOSIS — Z0000 Encounter for general adult medical examination without abnormal findings: Secondary | ICD-10-CM | POA: Diagnosis not present

## 2019-02-03 DIAGNOSIS — G629 Polyneuropathy, unspecified: Secondary | ICD-10-CM

## 2019-02-03 DIAGNOSIS — F331 Major depressive disorder, recurrent, moderate: Secondary | ICD-10-CM

## 2019-02-03 MED ORDER — CYCLOBENZAPRINE HCL 10 MG PO TABS: 10.0000 mg | ORAL_TABLET | Freq: Three times a day (TID) | ORAL | 5 refills | Status: DC | PRN

## 2019-02-03 MED ORDER — OMEPRAZOLE 20 MG PO CPDR: 20.0000 mg | DELAYED_RELEASE_CAPSULE | Freq: Every day | ORAL | 3 refills | Status: DC

## 2019-02-03 MED ORDER — OLMESARTAN MEDOXOMIL-HCTZ 20-12.5 MG PO TABS: 1.0000 | ORAL_TABLET | Freq: Every day | ORAL | 3 refills | Status: DC

## 2019-02-03 MED ORDER — GABAPENTIN 800 MG PO TABS: 800.0000 mg | ORAL_TABLET | Freq: Three times a day (TID) | ORAL | 5 refills | Status: AC

## 2019-02-03 MED ORDER — ALPRAZOLAM 0.5 MG PO TABS: 0.5000 mg | ORAL_TABLET | Freq: Two times a day (BID) | ORAL | 5 refills | Status: DC | PRN

## 2019-02-03 MED ORDER — DULOXETINE HCL 60 MG PO CPEP: 60.0000 mg | ORAL_CAPSULE | Freq: Every day | ORAL | 3 refills | Status: AC

## 2019-02-03 MED ORDER — FLUCONAZOLE 150 MG PO TABS: 150.0000 mg | ORAL_TABLET | ORAL | 5 refills | Status: DC

## 2019-02-03 NOTE — Telephone Encounter (Signed)
Left 2nd voice mail for pt to call back to schedule appointment for synvisc one injection

## 2019-02-03 NOTE — Progress Notes (Signed)
   Telephone visit  Subjective: CC:recheck on chronic conditions PCP: ,  S, PA-C HPI:Heidi Walls is a 64 y.o. female calls for telephone consult today. Patient provides verbal consent for consult held via phone.  Patient is identified with 2 separate identifiers.  At this time the entire area is on COVID-19 social distancing and stay home orders are in place.  Patient is of higher risk and therefore we are performing this by a virtual method.  Location of patient: home Location of provider: [facility] Others present for call: no  This patient is having a telephone visit for her follow-up on chronic medical conditions.  She has a son that was exposed to Covid who lives in her home.  Therefore we have started into a telephone visit this visit is for her neuropathy, she had a status post breast cancer and chemotherapy that caused the neuropathy.  She also is dealing with depression. To have elevated cholesterol, generalized anxiety disorder.  She is still under the care of.  Her defect and fracture.  It is healing well.  She deals with pain from osteoarthritis of the knees.  There has been discussion about surgery.  Come for lab work in order placed and she will come when she is clear of the Covid infection.  Also needs some refills on some of her medications that include gabapentin and Flexeril.  She has been doing well with those for her pain and neuropathy.      ROS: Per HPI  Allergies  Allergen Reactions  . Codeine Shortness Of Breath    Pt reports this was 30 years ago.   . Nsaids   . Lisinopril Cough  . Sulfamethoxazole    Past Medical History:  Diagnosis Date  . Anemia   . Anxiety    OCD  . Arthritis    knees and hips  . Asthma   . Breast cancer (HCC)   . Breast cancer of upper-inner quadrant of left female breast (HCC) 05/26/2015  . GERD (gastroesophageal reflux disease)   . Hypertension   . Insomnia     Current Outpatient Medications:  .   acetaminophen (TYLENOL) 325 MG tablet, Take 650 mg by mouth 2 (two) times daily. , Disp: , Rfl:  .  albuterol (PROAIR HFA) 108 (90 Base) MCG/ACT inhaler, Inhale 2 puffs into the lungs every 4 (four) hours as needed for wheezing or shortness of breath., Disp: 18 g, Rfl: 1 .  alendronate (FOSAMAX) 70 MG tablet, Take 1 tablet (70 mg total) by mouth every 7 (seven) days. Take with a full glass of water on an empty stomach., Disp: 12 tablet, Rfl: 3 .  ALPRAZolam (XANAX) 0.5 MG tablet, Take 1 tablet (0.5 mg total) by mouth 2 (two) times daily as needed for anxiety., Disp: 60 tablet, Rfl: 5 .  Azelastine HCl 0.15 % SOLN, One spray each nostril 1-2 times a day as needed, Disp: 30 mL, Rfl: 5 .  azithromycin (ZITHROMAX Z-PAK) 250 MG tablet, Take as directed, Disp: 6 each, Rfl: 0 .  B Complex-C (SUPER B COMPLEX PO), Take 1 tablet by mouth daily., Disp: , Rfl:  .  budesonide-formoterol (SYMBICORT) 160-4.5 MCG/ACT inhaler, 2 puffs twice daily with spacer device to prevent coughing or wheezing., Disp: 3 Inhaler, Rfl: 1 .  budesonide-formoterol (SYMBICORT) 80-4.5 MCG/ACT inhaler, Inhale 2 puffs into the lungs 2 (two) times daily., Disp: 1 Inhaler, Rfl: 5 .  Calcium Carb-Cholecalciferol (CALCIUM 600 + D PO), Take 1 tablet by mouth daily.,   Disp: , Rfl:  .  Carbinoxamine Maleate 4 MG TABS, 1 tablet q 8 hours for runny nose or itching, Disp: 120 tablet, Rfl: 5 .  cyclobenzaprine (FLEXERIL) 10 MG tablet, Take 1 tablet (10 mg total) by mouth 3 (three) times daily as needed for muscle spasms., Disp: 60 tablet, Rfl: 5 .  diclofenac sodium (VOLTAREN) 1 % GEL, Apply 4 g topically 4 (four) times daily., Disp: 400 g, Rfl: 11 .  DULoxetine (CYMBALTA) 60 MG capsule, Take 1 capsule (60 mg total) by mouth daily., Disp: 90 capsule, Rfl: 3 .  esomeprazole (NEXIUM) 20 MG capsule, Take 1 capsule (20 mg total) by mouth daily., Disp: 90 capsule, Rfl: 0 .  famotidine (PEPCID) 20 MG tablet, Take 1 tablet (20 mg total) by mouth 2 (two)  times daily., Disp: 60 tablet, Rfl: 5 .  fluconazole (DIFLUCAN) 150 MG tablet, Take 1 tablet (150 mg total) by mouth every other day. 1 po q week x 4 weeks, Disp: 12 tablet, Rfl: 5 .  FOLIC ACID PO, Take 1 tablet by mouth daily., Disp: , Rfl:  .  gabapentin (NEURONTIN) 800 MG tablet, Take 1 tablet (800 mg total) by mouth 3 (three) times daily., Disp: 90 tablet, Rfl: 5 .  ketoconazole (NIZORAL) 2 % cream, Apply 1 application topically daily., Disp: 30 g, Rfl: 0 .  letrozole (FEMARA) 2.5 MG tablet, Take 1 tablet (2.5 mg total) by mouth daily., Disp: 90 tablet, Rfl: 3 .  montelukast (SINGULAIR) 10 MG tablet, Take 1 tablet (10 mg total) by mouth at bedtime., Disp: 90 tablet, Rfl: 3 .  Multiple Vitamin (MULTIVITAMIN) tablet, Take 1 tablet by mouth daily., Disp: , Rfl:  .  olmesartan-hydrochlorothiazide (BENICAR HCT) 20-12.5 MG tablet, Take 1 tablet by mouth daily., Disp: 90 tablet, Rfl: 3 .  olopatadine (PATANOL) 0.1 % ophthalmic solution, Place 1 drop into both eyes 2 (two) times daily., Disp: 15 mL, Rfl: 3 .  Olopatadine HCl (PAZEO) 0.7 % SOLN, Apply 1 drop to eye daily. For itchy eyes, Disp: 2.5 mL, Rfl: 5 .  Omega-3 Fatty Acids (FISH OIL) 1200 MG CAPS, Take 1,200 mg by mouth daily., Disp: , Rfl:  .  omeprazole (PRILOSEC) 20 MG capsule, Take 1 capsule (20 mg total) by mouth daily., Disp: 90 capsule, Rfl: 3 .  predniSONE (STERAPRED UNI-PAK 21 TAB) 10 MG (21) TBPK tablet, As directed x 6 days, Disp: 21 tablet, Rfl: 0 .  Probiotic Product (PROBIOTIC DAILY PO), Take by mouth., Disp: , Rfl:  .  Tiotropium Bromide Monohydrate (SPIRIVA RESPIMAT) 1.25 MCG/ACT AERS, Inhale 2 puffs into the lungs daily., Disp: 4 g, Rfl: 5 .  traMADol (ULTRAM) 50 MG tablet, Take 1 tablet (50 mg total) by mouth every 6 (six) hours as needed for severe pain., Disp: 120 tablet, Rfl: 0 .  valACYclovir (VALTREX) 500 MG tablet, Take 1 tablet (500 mg total) by mouth 2 (two) times daily., Disp: 20 tablet, Rfl: 5  Assessment/ Plan 64  y.o. female   1. Generalized anxiety disorder - ALPRAZolam (XANAX) 0.5 MG tablet; Take 1 tablet (0.5 mg total) by mouth 2 (two) times daily as needed for anxiety.  Dispense: 60 tablet; Refill: 5 - ToxASSURE Select 13 (MW), Urine; Future  2. Chronic pain due to neoplasm - gabapentin (NEURONTIN) 800 MG tablet; Take 1 tablet (800 mg total) by mouth 3 (three) times daily.  Dispense: 90 tablet; Refill: 5 - cyclobenzaprine (FLEXERIL) 10 MG tablet; Take 1 tablet (10 mg total) by mouth 3 (three) times   daily as needed for muscle spasms.  Dispense: 60 tablet; Refill: 5  3. Neuropathy - gabapentin (NEURONTIN) 800 MG tablet; Take 1 tablet (800 mg total) by mouth 3 (three) times daily.  Dispense: 90 tablet; Refill: 5 - DULoxetine (CYMBALTA) 60 MG capsule; Take 1 capsule (60 mg total) by mouth daily.  Dispense: 90 capsule; Refill: 3  4. Moderate episode of recurrent major depressive disorder (HCC) - DULoxetine (CYMBALTA) 60 MG capsule; Take 1 capsule (60 mg total) by mouth daily.  Dispense: 90 capsule; Refill: 3  5. Well adult exam - CBC with Differential/Platelet; Future - CMP14+EGFR; Future - Lipid panel; Future - TSH; Future  6. Elevated cholesterol - Lipid panel; Future   Return in about 4 weeks (around 03/03/2019) for recheck medications.  Continue all other maintenance medications as listed above.  Start time: 9:30 Am End time: 9:46 AM  Meds ordered this encounter  Medications  . fluconazole (DIFLUCAN) 150 MG tablet    Sig: Take 1 tablet (150 mg total) by mouth every other day. 1 po q week x 4 weeks    Dispense:  12 tablet    Refill:  5    Order Specific Question:   Supervising Provider    Answer:   Janora Norlander [[id number]]  . ALPRAZolam (XANAX) 0.5 MG tablet    Sig: Take 1 tablet (0.5 mg total) by mouth 2 (two) times daily as needed for anxiety.    Dispense:  60 tablet    Refill:  5    This request is for a new prescription for a controlled substance as required by  Federal/State law.    Order Specific Question:   Supervising Provider    Answer:   Janora Norlander [[id number]]  . gabapentin (NEURONTIN) 800 MG tablet    Sig: Take 1 tablet (800 mg total) by mouth 3 (three) times daily.    Dispense:  90 tablet    Refill:  5    Order Specific Question:   Supervising Provider    Answer:   Janora Norlander [[id number]]  . DULoxetine (CYMBALTA) 60 MG capsule    Sig: Take 1 capsule (60 mg total) by mouth daily.    Dispense:  90 capsule    Refill:  3    Order Specific Question:   Supervising Provider    Answer:   Janora Norlander [[id number]]  . cyclobenzaprine (FLEXERIL) 10 MG tablet    Sig: Take 1 tablet (10 mg total) by mouth 3 (three) times daily as needed for muscle spasms.    Dispense:  60 tablet    Refill:  5    Order Specific Question:   Supervising Provider    Answer:   Janora Norlander [[id number]]  . olmesartan-hydrochlorothiazide (BENICAR HCT) 20-12.5 MG tablet    Sig: Take 1 tablet by mouth daily.    Dispense:  90 tablet    Refill:  3    Order Specific Question:   Supervising Provider    Answer:   Janora Norlander [[id number]]  . omeprazole (PRILOSEC) 20 MG capsule    Sig: Take 1 capsule (20 mg total) by mouth daily.    Dispense:  90 capsule    Refill:  3    Order Specific Question:   Supervising Provider    Answer:   Janora Norlander [[id number]]    Particia Nearing PA-C [place] [phone]

## 2019-02-06 ENCOUNTER — Other Ambulatory Visit: Payer: Self-pay | Admitting: Physician Assistant

## 2019-02-06 ENCOUNTER — Telehealth: Payer: Self-pay

## 2019-02-06 ENCOUNTER — Ambulatory Visit: Payer: BC Managed Care – PPO | Admitting: Podiatry

## 2019-02-06 MED ORDER — AZITHROMYCIN 250 MG PO TABS: ORAL_TABLET | ORAL | 0 refills | Status: DC

## 2019-02-06 MED ORDER — PREDNISONE 10 MG (21) PO TBPK: ORAL_TABLET | ORAL | 0 refills | Status: DC

## 2019-02-06 NOTE — Telephone Encounter (Signed)
Please call back patient.  Did she get tested for COVID? Any fevers or chills?   Given her symptoms, I recommend that she increase her Symbicort 160 2 puffs twice a day. Rinse mouth after use.  Use the albuterol 2 puffs before using Symbicort for the next few days.  May use albuterol rescue inhaler 2 puffs or nebulizer every 4 to 6 hours as needed for shortness of breath, chest tightness, coughing, and wheezing. May use albuterol rescue inhaler 2 puffs 5 to 15 minutes prior to strenuous physical activities. Monitor frequency of use.   Continue Singulair 10mg  daily.  If she doesn't want to go to pharmacy then does she not want me to refill her nasal spray?  Make sure she is taking her other medications.  If not better over the weekend go to Urgent care or ER. Otherwise, can you have her do a televisit with Webb Silversmith on Monday?  Allergic rhinitis Continue carbinoxamine 4 mg once every 8 hours as needed for nasal symptoms. Continue azelastine 1-2 sprays in each nostril twice a day as needed for a runny nose or sneezing Continue nasal saline rinses twice a day.

## 2019-02-06 NOTE — Progress Notes (Signed)
z-pak 

## 2019-02-06 NOTE — Telephone Encounter (Signed)
Patient feels ears and sinuses are "squeeky".  Some cough and hoarsness.  Severe nasal congestion and facial pressure.  No sore throat or headache.  Some wheeze but no chest tightness.  All clear drainage.  Taking Mucinex every 12 hours and carbinoxamine every 8 hours as prescribed.  Using Astelin.  Likes Nasacort but is out and does not want to go to the pharmacy due to possible COVID exposure 10 days ago. Also using saline nasal rinse daily. Patient concerned about use of Symbicort.  On 01/05/2019, Dr. Verlin Fester decreased her dose from Symbicort 160 mcg to Symbicort 80 mcg 2 puffs BID.  Patient has 6 inhalers of Symbicort 160 mcg on hand at home.  When she tried to fill Symbicort 80 mcg, it cost her over $100.  Patient has modified her dose and is taking Symbicort 160 mcg 1 puff twice a day (she may do an additional puff in the evening if her asthma flares. Please advise the correct dose.  Patient prefers to use the Symbicort 160 mcg she has at home.

## 2019-02-06 NOTE — Telephone Encounter (Signed)
Pt. Has not had a covid test nor has she  had no fever or chills. Pt. Hasn't been out of the house in 10- 14 days except to go the foot/chiroprator dr. Martin Majestic over your instructions with pt. Pt.'s nasal mucus is clear with dry cough. Pt. Does state she has pain where her sinuses are and her head hurts when she sneezes. Pt. States she was like this in mid December. I Did make her a televist with Webb Silversmith for Monday morning.

## 2019-02-09 ENCOUNTER — Encounter: Payer: Self-pay | Admitting: Family Medicine

## 2019-02-09 ENCOUNTER — Other Ambulatory Visit: Payer: Self-pay

## 2019-02-09 ENCOUNTER — Ambulatory Visit (INDEPENDENT_AMBULATORY_CARE_PROVIDER_SITE_OTHER): Payer: BC Managed Care – PPO | Admitting: Family Medicine

## 2019-02-09 VITALS — Ht 63.0 in | Wt 160.0 lb

## 2019-02-09 DIAGNOSIS — J4540 Moderate persistent asthma, uncomplicated: Secondary | ICD-10-CM | POA: Diagnosis not present

## 2019-02-09 DIAGNOSIS — K219 Gastro-esophageal reflux disease without esophagitis: Secondary | ICD-10-CM | POA: Diagnosis not present

## 2019-02-09 DIAGNOSIS — J019 Acute sinusitis, unspecified: Secondary | ICD-10-CM | POA: Diagnosis not present

## 2019-02-09 DIAGNOSIS — H1010 Acute atopic conjunctivitis, unspecified eye: Secondary | ICD-10-CM

## 2019-02-09 DIAGNOSIS — J3089 Other allergic rhinitis: Secondary | ICD-10-CM

## 2019-02-09 MED ORDER — TRIAMCINOLONE ACETONIDE 55 MCG/ACT NA AERO: 2.0000 | INHALATION_SPRAY | Freq: Every day | NASAL | 5 refills | Status: DC

## 2019-02-09 NOTE — Progress Notes (Signed)
RE: Heidi Walls MRN: [id number] DOB: [birth year] Date of Telemedicine Visit: 02/09/2019  Referring provider: Terald Sleeper, PA-C Primary care provider: Terald Sleeper, PA-C  Chief Complaint: Sinus Problem   Telemedicine Follow Up Visit via Telephone: I connected with Heidi Walls for a follow up on 02/09/19 by telephone and verified that I am speaking with the correct person using two identifiers.   I discussed the limitations, risks, security and privacy concerns of performing an evaluation and management service by telephone and the availability of in person appointments. I also discussed with the patient that there may be a patient responsible charge related to this service. The patient expressed understanding and agreed to proceed.  Patient is at home Provider is at the office.  Visit start time: 10:54 Visit end time: 11:33 Insurance consent/check in by: Anderson Malta T Medical consent and medical assistant/nurse: Gerre Pebbles  History of Present Illness: She is a 64 y.o. female, who is being followed for astham, allergic rhinitis, allergic conjunctivitis, reflux, and chronic thrush. Her previous allergy office visit was on 09/11/2018 with Gareth Morgan, [place].  Heidi Walls went to her primary care provider 4 days ago and received azithromycin 5-day pack and prednisone taper dose.  She has 1 day left of azithromycin and approximately 5 to 6 days of prednisone taper left.  She reports these medications have not improved her sinus congestion.  At today's visit, she reports her asthma has been moderately well controlled with some shortness of breath while outside and with activity, a little wheeze while outside and with activity, and frequent coughing which is a combination of dry and clear productive.  She reports these symptoms are worse in the cold weather.  She continues montelukast 10 mg once a day, Symbicort 160 mg 2 puffs twice a day with a spacer, and albuterol 1-2 times a day.  Allergic rhinitis  is reported as none not well controlled with clear sinus drainage, occasional thick postnasal drainage, and sinus pressure occurring behind both eyes.  She is currently out of Nasacort.  She continues azelastine daily, saline rinses daily, and carbinoxamine every 8 hours.  She reports sinus rinses are producing only clear drainage.  She reports reflux is moderately well controlled with famotidine 20 mg twice a day as well as omeprazole once a day.  Her current medications are listed in the chart.  Assessment and Plan: [name] is a 65 y.o. female with: Patient Instructions  Acute sinusitis Continue azithromycin as previously prescribed Continue prednisone taper as previously prescribed Continue nasal saline spray at least once a day Begin Nasacort 2 sprays twice a day If, after implementing the treatment plan above, you have no improvement in your symptoms we will need to consider a different antibiotic, Augmentin. Call the clinic with an update on Wednesday.   Asthma Continue montelukast 10 mg once a day to prevent cough or wheeze Continue Symbicort 160-2 puffs twice a day with a spacer to prevent cough or wheeze. Continue ProAir 2 puffs every 4 hours as needed for cough or wheeze  Allergic rhinitis Begin Nasacort 1-2 sprays in each nostril once a day as needed for a stuffy nose Begin cetirizine 10 mg once a day as needed for a runny nose. This will replace carbinoxamine Continue azelastine 1-2 sprays in each nostril twice a day as needed for a runny nose or sneezing Continue nasal saline rinses as needed for nasal symptoms  Allergic conjunctivitis Continue Pazeo eye drops once a day as needed for red, itchy  eyes. This will replace Patanol  Reflux Continue famotidine 20 mg twice a day for reflux Continue esomeprazole 20 mg once a day as previously prescribed Continue dietary and lifestyle modifications as listed below  Call the clinic if this treatment plan is not working well for  you  Follow up in 2 weeks or sooner if needed.     Return in about 2 weeks (around 02/23/2019), or if symptoms worsen or fail to improve.  Meds ordered this encounter  Medications  . triamcinolone (NASACORT) 55 MCG/ACT AERO nasal inhaler    Sig: Place 2 sprays into the nose daily.    Dispense:  1 Inhaler    Refill:  5    Medication List:  Current Outpatient Medications  Medication Sig Dispense Refill  . acetaminophen (TYLENOL) 325 MG tablet Take 650 mg by mouth 2 (two) times daily.     Marland Kitchen albuterol (PROAIR HFA) 108 (90 Base) MCG/ACT inhaler Inhale 2 puffs into the lungs every 4 (four) hours as needed for wheezing or shortness of breath. 18 g 1  . alendronate (FOSAMAX) 70 MG tablet Take 1 tablet (70 mg total) by mouth every 7 (seven) days. Take with a full glass of water on an empty stomach. 12 tablet 3  . ALPRAZolam (XANAX) 0.5 MG tablet Take 1 tablet (0.5 mg total) by mouth 2 (two) times daily as needed for anxiety. 60 tablet 5  . Azelastine HCl 0.15 % SOLN One spray each nostril 1-2 times a day as needed 30 mL 5  . azithromycin (ZITHROMAX Z-PAK) 250 MG tablet Take as directed 6 each 0  . B Complex-C (SUPER B COMPLEX PO) Take 1 tablet by mouth daily.    . budesonide-formoterol (SYMBICORT) 160-4.5 MCG/ACT inhaler 2 puffs twice daily with spacer device to prevent coughing or wheezing. 3 Inhaler 1  . Calcium Carb-Cholecalciferol (CALCIUM 600 + D PO) Take 1 tablet by mouth daily.    . Carbinoxamine Maleate 4 MG TABS 1 tablet q 8 hours for runny nose or itching 120 tablet 5  . cyclobenzaprine (FLEXERIL) 10 MG tablet Take 1 tablet (10 mg total) by mouth 3 (three) times daily as needed for muscle spasms. 60 tablet 5  . DULoxetine (CYMBALTA) 60 MG capsule Take 1 capsule (60 mg total) by mouth daily. 90 capsule 3  . famotidine (PEPCID) 20 MG tablet Take 1 tablet (20 mg total) by mouth 2 (two) times daily. 60 tablet 5  . fluconazole (DIFLUCAN) 150 MG tablet Take 1 tablet (150 mg total) by mouth  every other day. 1 po q week x 4 weeks 12 tablet 5  . gabapentin (NEURONTIN) 800 MG tablet Take 1 tablet (800 mg total) by mouth 3 (three) times daily. 90 tablet 5  . letrozole (FEMARA) 2.5 MG tablet Take 1 tablet (2.5 mg total) by mouth daily. 90 tablet 3  . montelukast (SINGULAIR) 10 MG tablet Take 1 tablet (10 mg total) by mouth at bedtime. 90 tablet 3  . Multiple Vitamin (MULTIVITAMIN) tablet Take 1 tablet by mouth daily.    Marland Kitchen olmesartan-hydrochlorothiazide (BENICAR HCT) 20-12.5 MG tablet Take 1 tablet by mouth daily. 90 tablet 3  . Olopatadine HCl (PAZEO) 0.7 % SOLN Apply 1 drop to eye daily. For itchy eyes 2.5 mL 5  . omeprazole (PRILOSEC) 20 MG capsule Take 1 capsule (20 mg total) by mouth daily. 90 capsule 3  . predniSONE (STERAPRED UNI-PAK 21 TAB) 10 MG (21) TBPK tablet As directed x 6 days 21 tablet 0  .  Probiotic Product (PROBIOTIC DAILY PO) Take by mouth.    . traMADol (ULTRAM) 50 MG tablet Take 1 tablet (50 mg total) by mouth every 6 (six) hours as needed for severe pain. 120 tablet 0  . valACYclovir (VALTREX) 500 MG tablet Take 1 tablet (500 mg total) by mouth 2 (two) times daily. 20 tablet 5  . budesonide-formoterol (SYMBICORT) 80-4.5 MCG/ACT inhaler Inhale 2 puffs into the lungs 2 (two) times daily. (Patient not taking: Reported on 02/09/2019) 1 Inhaler 5  . diclofenac sodium (VOLTAREN) 1 % GEL Apply 4 g topically 4 (four) times daily. (Patient not taking: Reported on 02/09/2019) 400 g 11  . esomeprazole (NEXIUM) 20 MG capsule Take 1 capsule (20 mg total) by mouth daily. (Patient not taking: Reported on 02/09/2019) 90 capsule 0  . FOLIC ACID PO Take 1 tablet by mouth daily.    Marland Kitchen ketoconazole (NIZORAL) 2 % cream Apply 1 application topically daily. (Patient not taking: Reported on 02/09/2019) 30 g 0  . olopatadine (PATANOL) 0.1 % ophthalmic solution Place 1 drop into both eyes 2 (two) times daily. (Patient not taking: Reported on 02/09/2019) 15 mL 3  . Omega-3 Fatty Acids (FISH OIL) 1200 MG  CAPS Take 1,200 mg by mouth daily.    . Tiotropium Bromide Monohydrate (SPIRIVA RESPIMAT) 1.25 MCG/ACT AERS Inhale 2 puffs into the lungs daily. (Patient not taking: Reported on 02/09/2019) 4 g 5  . triamcinolone (NASACORT) 55 MCG/ACT AERO nasal inhaler Place 2 sprays into the nose daily. 1 Inhaler 5   No current facility-administered medications for this visit.   Allergies: Allergies  Allergen Reactions  . Codeine Shortness Of Breath    Pt reports this was 30 years ago.   . Nsaids   . Lisinopril Cough  . Sulfamethoxazole    I reviewed her past medical history, social history, family history, and environmental history and no significant changes have been reported from previous visit on 09/11/2018.  Objective: Physical Exam Not obtained as encounter was done via telephone.   Previous notes and tests were reviewed.  I discussed the assessment and treatment plan with the patient. The patient was provided an opportunity to ask questions and all were answered. The patient agreed with the plan and demonstrated an understanding of the instructions.   The patient was advised to call back or seek an in-person evaluation if the symptoms worsen or if the condition fails to improve as anticipated.  I provided 39 minutes of non-face-to-face time during this encounter.  It was my pleasure to participate in Wing care today. Please feel free to contact me with any questions or concerns.   Sincerely,  Gareth Morgan, FNP

## 2019-02-09 NOTE — Patient Instructions (Addendum)
Acute sinusitis Continue azithromycin as previously prescribed Continue prednisone taper as previously prescribed Continue nasal saline spray at least once a day Begin Nasacort 2 sprays twice a day If, after implementing the treatment plan above, you have no improvement in your symptoms we will need to consider a different antibiotic, Augmentin. Call the clinic with an update on Wednesday.   Asthma Continue montelukast 10 mg once a day to prevent cough or wheeze Continue Symbicort 160-2 puffs twice a day with a spacer to prevent cough or wheeze. Continue ProAir 2 puffs every 4 hours as needed for cough or wheeze  Allergic rhinitis Begin Nasacort 1-2 sprays in each nostril once a day as needed for a stuffy nose Begin cetirizine 10 mg once a day as needed for a runny nose. This will replace carbinoxamine Continue azelastine 1-2 sprays in each nostril twice a day as needed for a runny nose or sneezing Continue nasal saline rinses as needed for nasal symptoms  Allergic conjunctivitis Continue Pazeo eye drops once a day as needed for red, itchy eyes. This will replace Patanol  Reflux Continue famotidine 20 mg twice a day for reflux Continue esomeprazole 20 mg once a day as previously prescribed Continue dietary and lifestyle modifications as listed below  Call the clinic if this treatment plan is not working well for you  Follow up in 2 weeks or sooner if needed.   Lifestyle Changes for Controlling GERD When you have GERD, stomach acid feels as if it's backing up toward your mouth. Whether or not you take medication to control your GERD, your symptoms can often be improved with lifestyle changes.   Raise Your Head  Reflux is more likely to strike when you're lying down flat, because stomach fluid can  flow backward more easily. Raising the head of your bed 4-6 inches can help. To do this:  Slide blocks or books under the legs at the head of your bed. Or, place a wedge under  the  mattress. Many foam stores can make a suitable wedge for you. The wedge  should run from your waist to the top of your head.  Don't just prop your head on several pillows. This increases pressure on your  stomach. It can make GERD worse.  Watch Your Eating Habits Certain foods may increase the acid in your stomach or relax the lower esophageal sphincter, making GERD more likely. It's best to avoid the following:  Coffee, tea, and carbonated drinks (with and without caffeine)  Fatty, fried, or spicy food  Mint, chocolate, onions, and tomatoes  Any other foods that seem to irritate your stomach or cause you pain  Relieve the Pressure  Eat smaller meals, even if you have to eat more often.  Don't lie down right after you eat. Wait a few hours for your stomach to empty.  Avoid tight belts and tight-fitting clothes.  Lose excess weight.  Tobacco and Alcohol  Avoid smoking tobacco and drinking alcohol. They can make GERD symptoms worse.

## 2019-02-12 ENCOUNTER — Telehealth: Payer: Self-pay

## 2019-02-12 NOTE — Telephone Encounter (Signed)
Ambs, Kathrine Cords, FNP  [facility]  Can you please call this patient and find out if her sinuses are doing better with the new treatment? Thank you   pt stated her sinuses are about the same, she was positive to covid (tested Monday, found out wed). Taking all meds

## 2019-02-14 DIAGNOSIS — R110 Nausea: Secondary | ICD-10-CM | POA: Diagnosis not present

## 2019-02-14 DIAGNOSIS — R0600 Dyspnea, unspecified: Secondary | ICD-10-CM | POA: Diagnosis not present

## 2019-02-14 DIAGNOSIS — R05 Cough: Secondary | ICD-10-CM | POA: Diagnosis not present

## 2019-02-14 DIAGNOSIS — R0789 Other chest pain: Secondary | ICD-10-CM | POA: Diagnosis not present

## 2019-02-14 DIAGNOSIS — R079 Chest pain, unspecified: Secondary | ICD-10-CM | POA: Diagnosis not present

## 2019-02-14 DIAGNOSIS — E860 Dehydration: Secondary | ICD-10-CM | POA: Diagnosis not present

## 2019-02-14 DIAGNOSIS — R000 Tachycardia, unspecified: Secondary | ICD-10-CM | POA: Diagnosis not present

## 2019-02-14 DIAGNOSIS — R0602 Shortness of breath: Secondary | ICD-10-CM | POA: Diagnosis not present

## 2019-02-14 DIAGNOSIS — U071 COVID-19: Secondary | ICD-10-CM | POA: Diagnosis not present

## 2019-02-16 NOTE — Telephone Encounter (Signed)
Can you please check in on this patient and ask how her sinuses are doing, and if she has any other covid related symptoms? Thank you

## 2019-02-16 NOTE — Telephone Encounter (Signed)
She reports some trouble with breathing, however, this has improved dramatically. She continues Symbicort 160-2 puffs twice a day with a spacer, montelukast 10 mg once a day, and albuterol as needed. She reports she is resting at home with her husband and will call the clinic with woresning symptoms.

## 2019-02-16 NOTE — Telephone Encounter (Signed)
She was in hospital sat with covid with breathing issues. Congestion in head and ears some what better. Feeling better.

## 2019-02-25 ENCOUNTER — Other Ambulatory Visit: Payer: Self-pay | Admitting: Physician Assistant

## 2019-03-11 ENCOUNTER — Ambulatory Visit: Payer: BC Managed Care – PPO | Admitting: Allergy and Immunology

## 2019-03-16 ENCOUNTER — Telehealth: Payer: Self-pay | Admitting: Physician Assistant

## 2019-03-16 NOTE — Telephone Encounter (Signed)
Lvm for pt to call me back  If pt calls back please schedule for Gel injection. She will have a $140 copay

## 2019-03-16 NOTE — Telephone Encounter (Signed)
Left another voice mail

## 2019-03-16 NOTE — Telephone Encounter (Signed)
Patient called back in regards to getting gel injection. Please call patient to set appt [phone]. She stated no vm received .

## 2019-03-17 DIAGNOSIS — M9903 Segmental and somatic dysfunction of lumbar region: Secondary | ICD-10-CM | POA: Diagnosis not present

## 2019-03-17 DIAGNOSIS — M9901 Segmental and somatic dysfunction of cervical region: Secondary | ICD-10-CM | POA: Diagnosis not present

## 2019-03-17 DIAGNOSIS — M545 Low back pain: Secondary | ICD-10-CM | POA: Diagnosis not present

## 2019-03-17 DIAGNOSIS — M25562 Pain in left knee: Secondary | ICD-10-CM | POA: Diagnosis not present

## 2019-03-20 DIAGNOSIS — M9901 Segmental and somatic dysfunction of cervical region: Secondary | ICD-10-CM | POA: Diagnosis not present

## 2019-03-20 DIAGNOSIS — M25562 Pain in left knee: Secondary | ICD-10-CM | POA: Diagnosis not present

## 2019-03-20 DIAGNOSIS — M9903 Segmental and somatic dysfunction of lumbar region: Secondary | ICD-10-CM | POA: Diagnosis not present

## 2019-03-20 DIAGNOSIS — M545 Low back pain: Secondary | ICD-10-CM | POA: Diagnosis not present

## 2019-03-24 ENCOUNTER — Other Ambulatory Visit: Payer: Self-pay | Admitting: Family Medicine

## 2019-03-24 ENCOUNTER — Telehealth: Payer: Self-pay | Admitting: Radiology

## 2019-03-24 NOTE — Telephone Encounter (Signed)
Which gel injection was this patient approved for? She is scheduled to come in on 3/17 however we are short on Synvisc One injections, just want to make sure this will not affect her. Thanks!

## 2019-03-24 NOTE — Telephone Encounter (Signed)
Left message for patient to call and reschedule appointment on 3/17. Synvisc one supplies will not be in office.

## 2019-03-24 NOTE — Telephone Encounter (Signed)
Synvisc One

## 2019-03-25 ENCOUNTER — Other Ambulatory Visit: Payer: Self-pay

## 2019-03-25 ENCOUNTER — Encounter: Payer: Self-pay | Admitting: Physician Assistant

## 2019-03-25 ENCOUNTER — Ambulatory Visit: Payer: BC Managed Care – PPO | Admitting: Physician Assistant

## 2019-03-25 DIAGNOSIS — M545 Low back pain: Secondary | ICD-10-CM | POA: Diagnosis not present

## 2019-03-25 DIAGNOSIS — M9903 Segmental and somatic dysfunction of lumbar region: Secondary | ICD-10-CM | POA: Diagnosis not present

## 2019-03-25 DIAGNOSIS — M25562 Pain in left knee: Secondary | ICD-10-CM | POA: Diagnosis not present

## 2019-03-25 DIAGNOSIS — M1711 Unilateral primary osteoarthritis, right knee: Secondary | ICD-10-CM | POA: Diagnosis not present

## 2019-03-25 DIAGNOSIS — M9901 Segmental and somatic dysfunction of cervical region: Secondary | ICD-10-CM | POA: Diagnosis not present

## 2019-03-25 MED ORDER — HYLAN G-F 20 48 MG/6ML IX SOSY
48.0000 mg | PREFILLED_SYRINGE | INTRA_ARTICULAR | Status: AC | PRN
  Administered 2019-03-25: 48 mg via INTRA_ARTICULAR

## 2019-03-25 MED ORDER — LIDOCAINE HCL 1 % IJ SOLN
5.0000 mL | INTRAMUSCULAR | Status: AC | PRN
  Administered 2019-03-25: 5 mL

## 2019-03-25 NOTE — Progress Notes (Signed)
   Procedure Note  Patient: Heidi Walls             Date of Birth: 10-26-1955           MRN: [id number]             Visit Date: 03/25/2019 HPI: Heidi Walls comes in today for Synvisc injection right knee.  Having anterior medial and posterior pain in the knee.  Difficulty bending the knee.  No new injury.  Physical exam: Right knee slight effusion.  No abnormal warmth erythema.  Tenderness along medial joint line.  Significant patellofemoral crepitus.  Procedures: Visit Diagnoses:  1. Arthritis of right knee     Large Joint Inj on 03/25/2019 3:16 PM Indications: pain Details: 22 G 1.5 in needle, anterolateral approach  Arthrogram: No  Medications: 48 mg Hylan 48 MG/6ML; 5 mL lidocaine 1 % Aspirate: 8 mL yellow and blood-tinged Outcome: tolerated well, no immediate complications Procedure, treatment alternatives, risks and benefits explained, specific risks discussed. Consent was given by the patient. Immediately prior to procedure a time out was called to verify the correct patient, procedure, equipment, support staff and site/side marked as required. Patient was prepped and draped in the usual sterile fashion.     Plan: She will follow up with [place] on as-needed basis.  She knows to wait at least 6 months between Synvisc 1 injections in the right knee.  Questions encouraged and answered.

## 2019-03-31 DIAGNOSIS — M9903 Segmental and somatic dysfunction of lumbar region: Secondary | ICD-10-CM | POA: Diagnosis not present

## 2019-03-31 DIAGNOSIS — M545 Low back pain: Secondary | ICD-10-CM | POA: Diagnosis not present

## 2019-03-31 DIAGNOSIS — M9901 Segmental and somatic dysfunction of cervical region: Secondary | ICD-10-CM | POA: Diagnosis not present

## 2019-03-31 DIAGNOSIS — M25562 Pain in left knee: Secondary | ICD-10-CM | POA: Diagnosis not present

## 2019-04-01 ENCOUNTER — Telehealth: Payer: Self-pay | Admitting: Physician Assistant

## 2019-04-01 ENCOUNTER — Ambulatory Visit: Payer: BC Managed Care – PPO | Admitting: Family Medicine

## 2019-04-01 ENCOUNTER — Telehealth: Payer: Self-pay

## 2019-04-01 ENCOUNTER — Other Ambulatory Visit: Payer: Self-pay

## 2019-04-01 ENCOUNTER — Encounter: Payer: Self-pay | Admitting: Family Medicine

## 2019-04-01 VITALS — BP 108/60 | HR 110 | Temp 98.6°F | Resp 20 | Ht 62.5 in | Wt 165.0 lb

## 2019-04-01 DIAGNOSIS — H1010 Acute atopic conjunctivitis, unspecified eye: Secondary | ICD-10-CM

## 2019-04-01 DIAGNOSIS — J4540 Moderate persistent asthma, uncomplicated: Secondary | ICD-10-CM | POA: Diagnosis not present

## 2019-04-01 DIAGNOSIS — K219 Gastro-esophageal reflux disease without esophagitis: Secondary | ICD-10-CM | POA: Diagnosis not present

## 2019-04-01 DIAGNOSIS — J3089 Other allergic rhinitis: Secondary | ICD-10-CM

## 2019-04-01 NOTE — Telephone Encounter (Signed)
Patient called.  She would like a call back to discuss her knee pain.   Call back number: [phone]

## 2019-04-01 NOTE — Telephone Encounter (Signed)
LMOM for patient that I was returning her call and to call back and leave message if she still has questions

## 2019-04-01 NOTE — Progress Notes (Addendum)
[address] [place] [address] Dept: [phone]  FOLLOW UP NOTE  Patient ID: Heidi Walls, female    DOB: March 17, 1955  Age: 64 y.o. MRN: [id number] Date of Office Visit: 04/01/2019  Assessment  Chief Complaint: Allergic Rhinitis  (doing okay), Asthma, Laryngitis, and Cough  HPI Heidi Walls is a 64 year old female who presents to the clinic today for follow-up.  She was last seen in this clinic on 02/09/2019 for evaluation of asthma, allergic rhinitis, allergic conjunctivitis, reflux, and thrush.  At today's visit she reports her asthma has been well controlled with no shortness of breath, cough, or wheeze with activity or rest.  She continues Symbicort 160-1 puff in the morning and 1 puff in the evening with a spacer.  She reports that occasionally she uses 1 puff in the afternoon as well.  She rarely uses her albuterol.  Allergic rhinitis is reported as moderately well controlled with nasal congestion as the main symptom for which she uses saline rinses once a day, Nasacort as needed, carbinoxamine twice a day, and Mucinex for serious congestion.  Allergic conjunctivitis is reported as well controlled with Pazeo as needed.  Reflux is reported as well controlled with famotidine 20 mg twice a day and omeprazole 20 mg once a day.  Her current medications are listed in the chart.   Drug Allergies:  Allergies  Allergen Reactions  . Codeine Shortness Of Breath    Pt reports this was 30 years ago.   . Nsaids   . Lisinopril Cough  . Sulfamethoxazole     Physical Exam: BP 108/60 (BP Location: Right Arm, Patient Position: Sitting, Cuff Size: Normal)   Pulse (!) 110   Temp 98.6 F (37 C) (Oral)   Resp 20   Ht 5' 2.5" (1.588 m)   Wt 165 lb (74.8 kg)   BMI 29.70 kg/m    Physical Exam Vitals reviewed.  Constitutional:      Appearance: Normal appearance.  HENT:     Head: Normocephalic and atraumatic.     Right Ear: Tympanic membrane normal.     Left Ear: Tympanic  membrane normal.     Nose:     Comments: Bilateral nares slightly erythematous with clear nasal drainage noted.  Pharynx with erythema and cobblestoning noted.  No exudate noted.  Ears normal.  Eyes normal. Eyes:     Conjunctiva/sclera: Conjunctivae normal.  Cardiovascular:     Rate and Rhythm: Normal rate and regular rhythm.     Heart sounds: Normal heart sounds. No murmur.  Pulmonary:     Effort: Pulmonary effort is normal.     Breath sounds: Normal breath sounds.     Comments: Lungs clear to auscultation Musculoskeletal:        General: Normal range of motion.     Cervical back: Normal range of motion and neck supple.  Skin:    General: Skin is warm and dry.  Neurological:     Mental Status: She is alert and oriented to person, place, and time.  Psychiatric:        Mood and Affect: Mood normal.        Behavior: Behavior normal.        Thought Content: Thought content normal.        Judgment: Judgment normal.     Diagnostics: FVC 2.42, FEV1 1.94.  Predicted FVC 3.10, predicted FEV1 2.38.  Spirometry indicates mild restriction.  This is consistent with previous spirometry readings.  Assessment and Plan: 1.  Moderate persistent asthma without complication   2. Perinnial allergic rhinitis with a probable non-allergic component   3. Seasonal allergic conjunctivitis   4. Gastroesophageal reflux disease, unspecified whether esophagitis present     Patient Instructions  Asthma Continue montelukast 10 mg once a day to prevent cough or wheeze Continue Symbicort 160-2 puffs twice a day with a spacer to prevent cough or wheeze. Continue ProAir 2 puffs every 4 hours as needed for cough or wheeze  Allergic rhinitis Begin Nasacort 1-2 sprays in each nostril once a day as needed for a stuffy nose Continue carbinoxamine twice a day as needed for a runny nose or itch Continue azelastine 1-2 sprays in each nostril twice a day as needed for a runny nose or sneezing Continue nasal saline  rinses as needed for nasal symptoms  Allergic conjunctivitis Continue Pazeo eye drops once a day as needed for red, itchy eyes.   Reflux Continue famotidine 20 mg twice a day for reflux Continue omeprazole 20 mg once a day as previously prescribed Continue dietary and lifestyle modifications as listed below  Call the clinic if this treatment plan is not working well for you  Follow up in 6 months or sooner if needed.   Return in about 6 months (around 10/02/2019), or if symptoms worsen or fail to improve.    Thank you for the opportunity to care for this patient.  Please do not hesitate to contact me with questions.  Gareth Morgan, FNP [facility]  ________________________________________________  I have provided oversight concerning [name]'s evaluation and treatment of this patient's health issues addressed during today's encounter.  I agree with the assessment and therapeutic plan as outlined in the note.   Signed,   R Edgar Frisk, MD

## 2019-04-01 NOTE — Patient Instructions (Addendum)
Asthma Continue montelukast 10 mg once a day to prevent cough or wheeze Continue Symbicort 160-2 puffs twice a day with a spacer to prevent cough or wheeze. Continue ProAir 2 puffs every 4 hours as needed for cough or wheeze  Allergic rhinitis Begin Nasacort 1-2 sprays in each nostril once a day as needed for a stuffy nose Continue carbinoxamine twice a day as needed for a runny nose or itch Continue azelastine 1-2 sprays in each nostril twice a day as needed for a runny nose or sneezing Continue nasal saline rinses as needed for nasal symptoms  Allergic conjunctivitis Continue Pazeo eye drops once a day as needed for red, itchy eyes.  Reflux Continue famotidine 20 mg twice a day for reflux Continue omeprazole 20 mg once a day as previously prescribed Continue dietary and lifestyle modifications as listed below  Call the clinic if this treatment plan is not working well for you  Follow up in 6 months or sooner if needed.   Lifestyle Changes for Controlling GERD When you have GERD, stomach acid feels as if it's backing up toward your mouth. Whether or not you take medication to control your GERD, your symptoms can often be improved with lifestyle changes.   Raise Your Head  Reflux is more likely to strike when you're lying down flat, because stomach fluid can  flow backward more easily. Raising the head of your bed 4-6 inches can help. To do this:  Slide blocks or books under the legs at the head of your bed. Or, place a wedge under  the mattress. Many foam stores can make a suitable wedge for you. The wedge  should run from your waist to the top of your head.  Don't just prop your head on several pillows. This increases pressure on your  stomach. It can make GERD worse.  Watch Your Eating Habits Certain foods may increase the acid in your stomach or relax the lower esophageal sphincter, making GERD more likely. It's best to avoid the following:  Coffee, tea, and  carbonated drinks (with and without caffeine)  Fatty, fried, or spicy food  Mint, chocolate, onions, and tomatoes  Any other foods that seem to irritate your stomach or cause you pain  Relieve the Pressure  Eat smaller meals, even if you have to eat more often.  Don't lie down right after you eat. Wait a few hours for your stomach to empty.  Avoid tight belts and tight-fitting clothes.  Lose excess weight.  Tobacco and Alcohol  Avoid smoking tobacco and drinking alcohol. They can make GERD symptoms worse.

## 2019-04-01 NOTE — Telephone Encounter (Signed)
Ambs, Kathrine Cords, FNP  [facility]  Can you please call this patient and ask if she is taking montelukast 10 mg. She indicated she was taking it on review of meds and then said she wasn't taking this because it made her feel wired up. Thank you   lm for pt to call us back

## 2019-04-02 ENCOUNTER — Telehealth: Payer: Self-pay | Admitting: Physician Assistant

## 2019-04-02 NOTE — Telephone Encounter (Signed)
Patient called and stated that the 6 month shot did not help any. She is still hurting daily. Patient sated she don't want to make an appt at this time.  Please call patient to advise. [phone]

## 2019-04-02 NOTE — Telephone Encounter (Signed)
Called and left message.

## 2019-04-02 NOTE — Telephone Encounter (Signed)
fyi

## 2019-04-03 ENCOUNTER — Encounter: Payer: Self-pay | Admitting: Podiatry

## 2019-04-03 ENCOUNTER — Ambulatory Visit (INDEPENDENT_AMBULATORY_CARE_PROVIDER_SITE_OTHER): Payer: BC Managed Care – PPO | Admitting: Podiatry

## 2019-04-03 ENCOUNTER — Other Ambulatory Visit: Payer: Self-pay

## 2019-04-03 VITALS — Temp 98.4°F

## 2019-04-03 DIAGNOSIS — G5763 Lesion of plantar nerve, bilateral lower limbs: Secondary | ICD-10-CM

## 2019-04-03 NOTE — Progress Notes (Signed)
  Subjective:  Patient ID: Heidi Walls, female    DOB: 1955/03/31,  MRN: [id number]  Chief Complaint  Patient presents with  . Neuroma    B/ l feet are much better,denies any pain today    64 y.o. female presents with the above complaint. History confirmed with patient. No having any pain at either foot today.  Objective:  Physical Exam: warm, good capillary refill, no trophic changes or ulcerative lesions, normal DP and PT pulses and normal sensory exam. Bilat Foot:no tenderness between the 3rd and 4th metatarsal head    Assessment:   1. Morton's metatarsalgia, neuralgia, or neuroma, bilateral     Plan:  Patient was evaluated and treated and all questions answered.  Morton Neuroma -Resolved. No injection today -F/u as needed should issues persist or recur.  No follow-ups on file.

## 2019-04-07 DIAGNOSIS — M9901 Segmental and somatic dysfunction of cervical region: Secondary | ICD-10-CM | POA: Diagnosis not present

## 2019-04-07 DIAGNOSIS — M545 Low back pain: Secondary | ICD-10-CM | POA: Diagnosis not present

## 2019-04-07 DIAGNOSIS — M9903 Segmental and somatic dysfunction of lumbar region: Secondary | ICD-10-CM | POA: Diagnosis not present

## 2019-04-07 DIAGNOSIS — M25562 Pain in left knee: Secondary | ICD-10-CM | POA: Diagnosis not present

## 2019-04-13 ENCOUNTER — Encounter: Payer: Self-pay | Admitting: Orthopaedic Surgery

## 2019-04-13 ENCOUNTER — Ambulatory Visit: Payer: BC Managed Care – PPO | Admitting: Orthopaedic Surgery

## 2019-04-13 ENCOUNTER — Other Ambulatory Visit: Payer: Self-pay

## 2019-04-13 DIAGNOSIS — M1711 Unilateral primary osteoarthritis, right knee: Secondary | ICD-10-CM | POA: Diagnosis not present

## 2019-04-13 NOTE — Progress Notes (Signed)
The patient is about 2 and half weeks to 3 weeks and 2 a Synvisc 1 injection in her right knee.  She states the injection really has not helped at all yet.  She does have known osteoarthritis in that right knee.  We had a long and thorough discussion about giving the Synvisc 1 injection more time.  I relayed to her that most studies show that the injection may not even start helping for 6 to 8 weeks.  I did have her demonstrate quad strengthening exercises back to me.  There is no knee joint effusion of her right knee.  Her pain is mainly posterior medial which is corresponding with findings on plain films and her MRI.  She will continue work on quad strengthening exercises and avoiding treadmills and keeping her mobility on a flat surface.  We can always repeat injection like this in 6 months from now if needed.  All question concerns were answered and addressed.  Follow-up is as needed.

## 2019-04-15 ENCOUNTER — Other Ambulatory Visit: Payer: Self-pay | Admitting: *Deleted

## 2019-04-15 DIAGNOSIS — F411 Generalized anxiety disorder: Secondary | ICD-10-CM

## 2019-04-20 DIAGNOSIS — M9901 Segmental and somatic dysfunction of cervical region: Secondary | ICD-10-CM | POA: Diagnosis not present

## 2019-04-20 DIAGNOSIS — M9903 Segmental and somatic dysfunction of lumbar region: Secondary | ICD-10-CM | POA: Diagnosis not present

## 2019-04-20 DIAGNOSIS — M25562 Pain in left knee: Secondary | ICD-10-CM | POA: Diagnosis not present

## 2019-04-20 DIAGNOSIS — M545 Low back pain: Secondary | ICD-10-CM | POA: Diagnosis not present

## 2019-04-23 DIAGNOSIS — M25562 Pain in left knee: Secondary | ICD-10-CM | POA: Diagnosis not present

## 2019-04-23 DIAGNOSIS — M545 Low back pain: Secondary | ICD-10-CM | POA: Diagnosis not present

## 2019-04-23 DIAGNOSIS — M9903 Segmental and somatic dysfunction of lumbar region: Secondary | ICD-10-CM | POA: Diagnosis not present

## 2019-04-23 DIAGNOSIS — M9901 Segmental and somatic dysfunction of cervical region: Secondary | ICD-10-CM | POA: Diagnosis not present

## 2019-04-29 DIAGNOSIS — M9903 Segmental and somatic dysfunction of lumbar region: Secondary | ICD-10-CM | POA: Diagnosis not present

## 2019-04-29 DIAGNOSIS — M9901 Segmental and somatic dysfunction of cervical region: Secondary | ICD-10-CM | POA: Diagnosis not present

## 2019-04-29 DIAGNOSIS — M545 Low back pain: Secondary | ICD-10-CM | POA: Diagnosis not present

## 2019-04-29 DIAGNOSIS — M25562 Pain in left knee: Secondary | ICD-10-CM | POA: Diagnosis not present

## 2019-05-11 ENCOUNTER — Telehealth: Payer: Self-pay | Admitting: Family Medicine

## 2019-05-11 NOTE — Telephone Encounter (Signed)
Can you please have her increase Symbicort 160 to 2 puffs twice a day with a spacer, and continue saline nasal rinses, Nasacort, carbinoxamine, Mucinex, and famotidine. She can take Delsym for the cough. Please have her call the clinic for an appointment or go to ED for a fever or worsening symptoms. Thank you

## 2019-05-11 NOTE — Telephone Encounter (Signed)
Patient informed. Will follow instructions from Webb Silversmith and will let us know if not doing any better.

## 2019-05-11 NOTE — Telephone Encounter (Signed)
Please advise 

## 2019-05-11 NOTE — Telephone Encounter (Signed)
On friday, a deep cough started, started taking mucinex. Thinks she may have bronchitis, and wants to know if she should be doing anything else extra?

## 2019-05-15 DIAGNOSIS — M9901 Segmental and somatic dysfunction of cervical region: Secondary | ICD-10-CM | POA: Diagnosis not present

## 2019-05-15 DIAGNOSIS — M9903 Segmental and somatic dysfunction of lumbar region: Secondary | ICD-10-CM | POA: Diagnosis not present

## 2019-05-15 DIAGNOSIS — M25562 Pain in left knee: Secondary | ICD-10-CM | POA: Diagnosis not present

## 2019-05-15 DIAGNOSIS — M545 Low back pain: Secondary | ICD-10-CM | POA: Diagnosis not present

## 2019-05-21 DIAGNOSIS — J3089 Other allergic rhinitis: Secondary | ICD-10-CM | POA: Diagnosis not present

## 2019-05-21 DIAGNOSIS — F339 Major depressive disorder, recurrent, unspecified: Secondary | ICD-10-CM | POA: Insufficient documentation

## 2019-05-21 DIAGNOSIS — I10 Essential (primary) hypertension: Secondary | ICD-10-CM | POA: Diagnosis not present

## 2019-05-21 DIAGNOSIS — R490 Dysphonia: Secondary | ICD-10-CM | POA: Insufficient documentation

## 2019-05-21 DIAGNOSIS — F419 Anxiety disorder, unspecified: Secondary | ICD-10-CM | POA: Insufficient documentation

## 2019-05-21 DIAGNOSIS — J4540 Moderate persistent asthma, uncomplicated: Secondary | ICD-10-CM | POA: Diagnosis not present

## 2019-05-21 DIAGNOSIS — M8949 Other hypertrophic osteoarthropathy, multiple sites: Secondary | ICD-10-CM | POA: Insufficient documentation

## 2019-05-21 DIAGNOSIS — M159 Polyosteoarthritis, unspecified: Secondary | ICD-10-CM | POA: Insufficient documentation

## 2019-05-21 DIAGNOSIS — K219 Gastro-esophageal reflux disease without esophagitis: Secondary | ICD-10-CM | POA: Diagnosis not present

## 2019-05-22 DIAGNOSIS — M545 Low back pain: Secondary | ICD-10-CM | POA: Diagnosis not present

## 2019-05-22 DIAGNOSIS — M25562 Pain in left knee: Secondary | ICD-10-CM | POA: Diagnosis not present

## 2019-05-22 DIAGNOSIS — M9901 Segmental and somatic dysfunction of cervical region: Secondary | ICD-10-CM | POA: Diagnosis not present

## 2019-05-22 DIAGNOSIS — M9903 Segmental and somatic dysfunction of lumbar region: Secondary | ICD-10-CM | POA: Diagnosis not present

## 2019-05-25 DIAGNOSIS — Z1321 Encounter for screening for nutritional disorder: Secondary | ICD-10-CM | POA: Diagnosis not present

## 2019-05-25 DIAGNOSIS — Z13220 Encounter for screening for lipoid disorders: Secondary | ICD-10-CM | POA: Diagnosis not present

## 2019-05-25 DIAGNOSIS — Z87891 Personal history of nicotine dependence: Secondary | ICD-10-CM | POA: Diagnosis not present

## 2019-05-25 DIAGNOSIS — I10 Essential (primary) hypertension: Secondary | ICD-10-CM | POA: Diagnosis not present

## 2019-05-25 DIAGNOSIS — Z131 Encounter for screening for diabetes mellitus: Secondary | ICD-10-CM | POA: Diagnosis not present

## 2019-05-26 DIAGNOSIS — M25562 Pain in left knee: Secondary | ICD-10-CM | POA: Diagnosis not present

## 2019-05-26 DIAGNOSIS — M545 Low back pain: Secondary | ICD-10-CM | POA: Diagnosis not present

## 2019-05-26 DIAGNOSIS — M9901 Segmental and somatic dysfunction of cervical region: Secondary | ICD-10-CM | POA: Diagnosis not present

## 2019-05-26 DIAGNOSIS — M9903 Segmental and somatic dysfunction of lumbar region: Secondary | ICD-10-CM | POA: Diagnosis not present

## 2019-05-28 ENCOUNTER — Telehealth: Payer: Self-pay | Admitting: Orthopaedic Surgery

## 2019-05-28 NOTE — Telephone Encounter (Signed)
Received vm from patient wanting records & xrays. IC,lmvm. Advised need to sign release form and once we receive that we can begin getting records ready for her.

## 2019-05-29 DIAGNOSIS — M9903 Segmental and somatic dysfunction of lumbar region: Secondary | ICD-10-CM | POA: Diagnosis not present

## 2019-05-29 DIAGNOSIS — M545 Low back pain: Secondary | ICD-10-CM | POA: Diagnosis not present

## 2019-05-29 DIAGNOSIS — M25562 Pain in left knee: Secondary | ICD-10-CM | POA: Diagnosis not present

## 2019-05-29 DIAGNOSIS — M9901 Segmental and somatic dysfunction of cervical region: Secondary | ICD-10-CM | POA: Diagnosis not present

## 2019-06-01 ENCOUNTER — Telehealth: Payer: Self-pay | Admitting: Orthopaedic Surgery

## 2019-06-01 ENCOUNTER — Telehealth: Payer: Self-pay | Admitting: Family Medicine

## 2019-06-01 MED ORDER — AIRDUO DIGIHALER 55-14 MCG/ACT IN AEPB: 1.0000 | INHALATION_SPRAY | Freq: Two times a day (BID) | RESPIRATORY_TRACT | 5 refills | Status: DC

## 2019-06-01 NOTE — Telephone Encounter (Signed)
There is no savings cards for symbicort can we try brezetri or another option please?

## 2019-06-01 NOTE — Telephone Encounter (Signed)
Per anne air duo 55/14 1 puff q 12 hours PT INFOREMED

## 2019-06-01 NOTE — Telephone Encounter (Signed)
Received vm from patient stating that she needs records&xays,and has left previous msg and hasn't heard back. Ic,na, lmvm, advised I called and left msg 5/20 at 12:24 advised that a records release form needs to completed. I stated that again today on the vm. Once we receive the release form we can proceed on getting records & xrays ready.

## 2019-06-01 NOTE — Telephone Encounter (Signed)
Patient was using symbicort 160 4.5 was switched to symbicort  80 1.5 it is $100 a month. Asking for advice on supplementing the cost.

## 2019-06-02 DIAGNOSIS — M9903 Segmental and somatic dysfunction of lumbar region: Secondary | ICD-10-CM | POA: Diagnosis not present

## 2019-06-02 DIAGNOSIS — M9901 Segmental and somatic dysfunction of cervical region: Secondary | ICD-10-CM | POA: Diagnosis not present

## 2019-06-02 DIAGNOSIS — M25562 Pain in left knee: Secondary | ICD-10-CM | POA: Diagnosis not present

## 2019-06-02 DIAGNOSIS — M545 Low back pain: Secondary | ICD-10-CM | POA: Diagnosis not present

## 2019-06-03 ENCOUNTER — Ambulatory Visit: Payer: BC Managed Care – PPO | Admitting: Allergy and Immunology

## 2019-06-03 ENCOUNTER — Telehealth: Payer: Self-pay | Admitting: Family Medicine

## 2019-06-03 MED ORDER — CARBINOXAMINE MALEATE 4 MG PO TABS: ORAL_TABLET | ORAL | 5 refills | Status: DC

## 2019-06-03 NOTE — Telephone Encounter (Signed)
PT spoke with [facility], insurance has not responded yet for airduo digihaler so [facility] will be faxing authorization request to our office. PT also needs carbinoxamine refilled

## 2019-06-03 NOTE — Telephone Encounter (Signed)
Thank you Amy!  ................

## 2019-06-03 NOTE — Telephone Encounter (Signed)
Sent in refill for carbinoxamine.  Last OV 04/01/19 with Gareth Morgan.  No fax from [facility] regarding PA for Airduo digihaler.  Will contact [facility] and give Savings Card from [employer] for Digihalers. John, pharmacist at [facility] ran card and patient copay is now $35 for AirDuo Digihaler. He will get rx ready for patient to pick up this afternoon. Called patient with information and informed Webb Silversmith.

## 2019-06-05 ENCOUNTER — Encounter: Payer: Self-pay | Admitting: Family Medicine

## 2019-06-05 ENCOUNTER — Other Ambulatory Visit: Payer: Self-pay

## 2019-06-05 ENCOUNTER — Ambulatory Visit: Payer: BC Managed Care – PPO | Admitting: Family Medicine

## 2019-06-05 VITALS — BP 128/70 | HR 116 | Temp 98.1°F | Resp 16

## 2019-06-05 DIAGNOSIS — H1010 Acute atopic conjunctivitis, unspecified eye: Secondary | ICD-10-CM

## 2019-06-05 DIAGNOSIS — J3089 Other allergic rhinitis: Secondary | ICD-10-CM | POA: Diagnosis not present

## 2019-06-05 DIAGNOSIS — K219 Gastro-esophageal reflux disease without esophagitis: Secondary | ICD-10-CM | POA: Diagnosis not present

## 2019-06-05 DIAGNOSIS — H6981 Other specified disorders of Eustachian tube, right ear: Secondary | ICD-10-CM

## 2019-06-05 DIAGNOSIS — J4540 Moderate persistent asthma, uncomplicated: Secondary | ICD-10-CM

## 2019-06-05 MED ORDER — AIRDUO DIGIHALER 55-14 MCG/ACT IN AEPB: INHALATION_SPRAY | RESPIRATORY_TRACT | 5 refills | Status: DC

## 2019-06-05 MED ORDER — CARBINOXAMINE MALEATE 4 MG PO TABS: ORAL_TABLET | ORAL | 3 refills | Status: DC

## 2019-06-05 MED ORDER — AZELASTINE HCL 0.1 % NA SOLN: NASAL | 5 refills | Status: DC

## 2019-06-05 NOTE — Progress Notes (Signed)
[address] [place] [address] Dept: [phone]  FOLLOW UP NOTE  Patient ID: Heidi Walls, female    DOB: 29-Jan-1955  Age: 64 y.o. MRN: [id number] Date of Office Visit: 06/05/2019  Assessment  Chief Complaint: Allergic Rhinitis , Asthma, and Hoarse  HPI Heidi Walls is a 64 year old female who presents to the clinic for a follow-up visit.  She was last seen in this clinic on 04/01/2019 for evaluation of asthma, allergic rhinitis, allergic conjunctivitis, and reflux.  At today's visit she reports that her asthma has been not well controlled with occasional shortness of breath and cough that sounds " croupy" for the last 2 weeks.  She has started using montelukast 10 mg for the last 4 days and continues albuterol as needed.  Symbicort has become unaffordable for her at this time and she is currently waiting on pharmacy approval of air duo 55/14 inhaler.  Allergic rhinitis is reported as poorly controlled with increased postnasal drainage for which she continues saline rinses twice a day starting 3 days ago and Nasonex.  She is currently out of carbinoxamine and this medication has become not affordable for her.  Allergic conjunctivitis is reported as well controlled with no medical intervention at this time.  Reflux is reported as well controlled with famotidine twice a day and omeprazole 20 mg once a day.  She reports bilateral ear fullness with out hearing changes or ear pain.  Her current medications are listed in the chart.   Drug Allergies:  Allergies  Allergen Reactions  . Codeine Shortness Of Breath    Pt reports this was 30 years ago.   . Nsaids   . Lisinopril Cough  . Sulfamethoxazole     Physical Exam: BP 128/70 (BP Location: Right Arm, Patient Position: Sitting, Cuff Size: Normal)   Pulse (!) 116   Temp 98.1 F (36.7 C) (Oral)   Resp 16   SpO2 98%    Physical Exam Vitals reviewed.  Constitutional:      Appearance: Normal appearance.  HENT:     Head:  Normocephalic and atraumatic.     Right Ear: Tympanic membrane normal.     Left Ear: Tympanic membrane normal.     Nose:     Comments: Bilateral nares erythematous with clear nasal drainage noted.  Pharynx with cobblestone appearance.  Right TM with clear effusion left TM normal.  Eyes normal. Eyes:     Conjunctiva/sclera: Conjunctivae normal.  Cardiovascular:     Rate and Rhythm: Normal rate and regular rhythm.     Heart sounds: Normal heart sounds. No murmur.  Pulmonary:     Effort: Pulmonary effort is normal.     Comments: Lungs clear to auscultation Musculoskeletal:        General: Normal range of motion.     Cervical back: Normal range of motion and neck supple.  Skin:    General: Skin is warm and dry.  Neurological:     Mental Status: She is alert and oriented to person, place, and time.  Psychiatric:        Mood and Affect: Mood normal.        Behavior: Behavior normal.        Thought Content: Thought content normal.        Judgment: Judgment normal.     Diagnostics: FVC 2.34, FEV1 1.92.  Predicted FVC 3.10, predicted FEV1 2.38.  Spirometry indicates mild restriction.  This is consistent with previous spirometry readings  Assessment and Plan:  1. Moderate persistent asthma without complication   2. Perinnial allergic rhinitis with a probable non-allergic component   3. Seasonal allergic conjunctivitis   4. Gastroesophageal reflux disease, unspecified whether esophagitis present   5. Dysfunction of right eustachian tube     Meds ordered this encounter  Medications  . azelastine (ASTELIN) 0.1 % nasal spray    Sig: 1-2 sprays per nostril twice daily for runny nose    Dispense:  30 mL    Refill:  5  . Carbinoxamine Maleate 4 MG TABS    Sig: One tablet every 8 hours for runny nose    Dispense:  90 tablet    Refill:  3  . Fluticasone-Salmeterol,sensor, (AIRDUO DIGIHALER) 55-14 MCG/ACT AEPB    Sig: 1 puff twice daily to prevent coughing or wheezing.    Dispense:  1  each    Refill:  5    Patient Instructions  Asthma Continue montelukast 10 mg once a day to prevent cough or wheeze Continue sample of AirDuo 1 puff twice a dayto prevent cough or wheeze. Continue ProAir 2 puffs every 4 hours as needed for cough or wheeze  Allergic rhinitis Continue Nasacort 1-2 sprays in each nostril once a day as needed for a stuffy nose Continue RyVent 6 mg twice a day as needed for a runny nose or itch Continue azelastine 1-2 sprays in each nostril twice a day as needed for a runny nose or sneezing Continue nasal saline rinses as needed for nasal symptoms If this treatment plan is not effective, begin prednisone 10 mg tablets. Take 2 tablets once a day for 4 days, then take 1 tablet once on the 5th day, then stop  Allergic conjunctivitis Some over the counter eye drops include Pataday one drop in each eye once a day as needed for red, itchy eyes OR Zaditor one drop in each eye twice a day as needed for red itchy eyes.  Reflux Continue famotidine 20 mg twice a day for reflux Continue omeprazole 20 mg once a day as previously prescribed Continue dietary and lifestyle modifications as listed below  Call the clinic if this treatment plan is not working well for you  Follow up in 2 months or sooner if needed.    Return in about 2 months (around 08/05/2019), or if symptoms worsen or fail to improve.    Thank you for the opportunity to care for this patient.  Please do not hesitate to contact me with questions.  Gareth Morgan, FNP [facility]

## 2019-06-05 NOTE — Patient Instructions (Signed)
Asthma Continue montelukast 10 mg once a day to prevent cough or wheeze Continue sample of AirDuo 1 puff twice a dayto prevent cough or wheeze. Continue ProAir 2 puffs every 4 hours as needed for cough or wheeze  Allergic rhinitis Continue Nasacort 1-2 sprays in each nostril once a day as needed for a stuffy nose Continue RyVent 6 mg twice a day as needed for a runny nose or itch Continue azelastine 1-2 sprays in each nostril twice a day as needed for a runny nose or sneezing Continue nasal saline rinses as needed for nasal symptoms If this treatment plan is not effective, begin prednisone 10 mg tablets. Take 2 tablets once a day for 4 days, then take 1 tablet once on the 5th day, then stop  Allergic conjunctivitis Some over the counter eye drops include Pataday one drop in each eye once a day as needed for red, itchy eyes OR Zaditor one drop in each eye twice a day as needed for red itchy eyes.  Reflux Continue famotidine 20 mg twice a day for reflux Continue omeprazole 20 mg once a day as previously prescribed Continue dietary and lifestyle modifications as listed below  Call the clinic if this treatment plan is not working well for you  Follow up in 2 months or sooner if needed.   Lifestyle Changes for Controlling GERD When you have GERD, stomach acid feels as if it's backing up toward your mouth. Whether or not you take medication to control your GERD, your symptoms can often be improved with lifestyle changes.   Raise Your Head  Reflux is more likely to strike when you're lying down flat, because stomach fluid can  flow backward more easily. Raising the head of your bed 4-6 inches can help. To do this:  Slide blocks or books under the legs at the head of your bed. Or, place a wedge under  the mattress. Many foam stores can make a suitable wedge for you. The wedge  should run from your waist to the top of your head.  Don't just prop your head on several pillows. This  increases pressure on your  stomach. It can make GERD worse.  Watch Your Eating Habits Certain foods may increase the acid in your stomach or relax the lower esophageal sphincter, making GERD more likely. It's best to avoid the following:  Coffee, tea, and carbonated drinks (with and without caffeine)  Fatty, fried, or spicy food  Mint, chocolate, onions, and tomatoes  Any other foods that seem to irritate your stomach or cause you pain  Relieve the Pressure  Eat smaller meals, even if you have to eat more often.  Don't lie down right after you eat. Wait a few hours for your stomach to empty.  Avoid tight belts and tight-fitting clothes.  Lose excess weight.  Tobacco and Alcohol  Avoid smoking tobacco and drinking alcohol. They can make GERD symptoms worse.

## 2019-06-09 ENCOUNTER — Telehealth: Payer: Self-pay | Admitting: Orthopaedic Surgery

## 2019-06-09 NOTE — Telephone Encounter (Signed)
Receive vm from patient checking on her request for records to go to [facility]. IC,lmvm advised we do have her release form, she just completed late last week,she has it dated 5/29, likely was 5/28. I advised her it is missing a fax number and [facility] is very large medical facility. Asked if she could call back and provide fax number, otherwise I would mail records to the address that she wrote in on the release form.

## 2019-06-10 DIAGNOSIS — M25562 Pain in left knee: Secondary | ICD-10-CM | POA: Diagnosis not present

## 2019-06-10 DIAGNOSIS — M9901 Segmental and somatic dysfunction of cervical region: Secondary | ICD-10-CM | POA: Diagnosis not present

## 2019-06-10 DIAGNOSIS — M9903 Segmental and somatic dysfunction of lumbar region: Secondary | ICD-10-CM | POA: Diagnosis not present

## 2019-06-10 DIAGNOSIS — M545 Low back pain: Secondary | ICD-10-CM | POA: Diagnosis not present

## 2019-06-11 ENCOUNTER — Telehealth: Payer: Self-pay | Admitting: Orthopaedic Surgery

## 2019-06-11 NOTE — Telephone Encounter (Signed)
Received another vm from patient about her request for records to [place], got her voice mail. I left another vm, advised I was out of the office yesterday and that left her a msg on 6/1 advising that I was going to be out of the office 6/2. I again asked her to provide [place] with a fax number. Otherwise will prepare records to be mailed to address provided on her release form. I advised she will need to get the MRI CD from [place].

## 2019-06-11 NOTE — Telephone Encounter (Signed)
CD made 

## 2019-06-11 NOTE — Telephone Encounter (Signed)
Please copy xrays to CD. I am preparing records also and will mail. Let me know when ready. Thanks!

## 2019-06-16 DIAGNOSIS — Z9012 Acquired absence of left breast and nipple: Secondary | ICD-10-CM | POA: Diagnosis not present

## 2019-06-16 DIAGNOSIS — N6489 Other specified disorders of breast: Secondary | ICD-10-CM | POA: Diagnosis not present

## 2019-06-17 DIAGNOSIS — M9903 Segmental and somatic dysfunction of lumbar region: Secondary | ICD-10-CM | POA: Diagnosis not present

## 2019-06-17 DIAGNOSIS — M9901 Segmental and somatic dysfunction of cervical region: Secondary | ICD-10-CM | POA: Diagnosis not present

## 2019-06-17 DIAGNOSIS — M25562 Pain in left knee: Secondary | ICD-10-CM | POA: Diagnosis not present

## 2019-06-17 DIAGNOSIS — M545 Low back pain: Secondary | ICD-10-CM | POA: Diagnosis not present

## 2019-06-24 DIAGNOSIS — M25562 Pain in left knee: Secondary | ICD-10-CM | POA: Diagnosis not present

## 2019-06-24 DIAGNOSIS — M9903 Segmental and somatic dysfunction of lumbar region: Secondary | ICD-10-CM | POA: Diagnosis not present

## 2019-06-24 DIAGNOSIS — M9901 Segmental and somatic dysfunction of cervical region: Secondary | ICD-10-CM | POA: Diagnosis not present

## 2019-06-24 DIAGNOSIS — M545 Low back pain: Secondary | ICD-10-CM | POA: Diagnosis not present

## 2019-07-03 DIAGNOSIS — M25562 Pain in left knee: Secondary | ICD-10-CM | POA: Diagnosis not present

## 2019-07-03 DIAGNOSIS — M9903 Segmental and somatic dysfunction of lumbar region: Secondary | ICD-10-CM | POA: Diagnosis not present

## 2019-07-03 DIAGNOSIS — M545 Low back pain: Secondary | ICD-10-CM | POA: Diagnosis not present

## 2019-07-03 DIAGNOSIS — M9901 Segmental and somatic dysfunction of cervical region: Secondary | ICD-10-CM | POA: Diagnosis not present

## 2019-07-09 DIAGNOSIS — H2513 Age-related nuclear cataract, bilateral: Secondary | ICD-10-CM | POA: Diagnosis not present

## 2019-07-15 DIAGNOSIS — Z1231 Encounter for screening mammogram for malignant neoplasm of breast: Secondary | ICD-10-CM | POA: Diagnosis not present

## 2019-07-20 DIAGNOSIS — M7121 Synovial cyst of popliteal space [Baker], right knee: Secondary | ICD-10-CM | POA: Diagnosis not present

## 2019-07-20 DIAGNOSIS — M1711 Unilateral primary osteoarthritis, right knee: Secondary | ICD-10-CM | POA: Diagnosis not present

## 2019-07-21 ENCOUNTER — Other Ambulatory Visit: Payer: Self-pay | Admitting: *Deleted

## 2019-07-21 DIAGNOSIS — G893 Neoplasm related pain (acute) (chronic): Secondary | ICD-10-CM

## 2019-07-21 MED ORDER — CYCLOBENZAPRINE HCL 10 MG PO TABS: 10.0000 mg | ORAL_TABLET | Freq: Three times a day (TID) | ORAL | 0 refills | Status: DC | PRN

## 2019-07-21 NOTE — Telephone Encounter (Signed)
Needs OV to est care

## 2019-07-31 ENCOUNTER — Encounter: Payer: Self-pay | Admitting: Podiatry

## 2019-07-31 ENCOUNTER — Other Ambulatory Visit: Payer: Self-pay

## 2019-07-31 ENCOUNTER — Ambulatory Visit (INDEPENDENT_AMBULATORY_CARE_PROVIDER_SITE_OTHER): Payer: BC Managed Care – PPO

## 2019-07-31 ENCOUNTER — Ambulatory Visit: Payer: BC Managed Care – PPO | Admitting: Podiatry

## 2019-07-31 DIAGNOSIS — M7752 Other enthesopathy of left foot: Secondary | ICD-10-CM

## 2019-07-31 DIAGNOSIS — G629 Polyneuropathy, unspecified: Secondary | ICD-10-CM

## 2019-07-31 DIAGNOSIS — S8262XK Displaced fracture of lateral malleolus of left fibula, subsequent encounter for closed fracture with nonunion: Secondary | ICD-10-CM

## 2019-07-31 DIAGNOSIS — G5762 Lesion of plantar nerve, left lower limb: Secondary | ICD-10-CM

## 2019-07-31 DIAGNOSIS — G5782 Other specified mononeuropathies of left lower limb: Secondary | ICD-10-CM

## 2019-07-31 DIAGNOSIS — M810 Age-related osteoporosis without current pathological fracture: Secondary | ICD-10-CM

## 2019-07-31 DIAGNOSIS — M7750 Other enthesopathy of unspecified foot: Secondary | ICD-10-CM

## 2019-07-31 NOTE — Progress Notes (Signed)
  Subjective:  Patient ID: Heidi Walls, female    DOB: May 17, 1955,  MRN: [id number]  Chief Complaint  Patient presents with  . Foot Problem    the left foot hurts from the center to the 3rd toe over on the out side    64 y.o. female presents with the above complaint. History confirmed with patient. No having any pain at either foot today.  Objective:  Physical Exam: warm, good capillary refill, no trophic changes or ulcerative lesions, normal DP and PT pulses and normal sensory exam.  Tenderness noted at the segment of the left fibula no tenderness around the peroneal tendons, tenderness noted at the left third interspace with Mulder's click local numbness of the digits  Assessment:   1. 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 Osteoporosis, unspecified osteoporosis type, unspecified pathological fracture presence   4. Neuropathy     Plan:  Patient was evaluated and treated and all questions answered.  Morton Neuroma left -Discussed continued injection therapy versus excision of the interdigital nerve neuroma.  Discussed with surgical excision she would have permanent numbness of the third and fourth toes -Patient would like to proceed with excision of the nerve  Avulsion fibula with non-union, left -Has maximized use of her bone stimulator -Still having pain that prevents her ability to walk -Discussed at this point she would benefit from excision of the avulsed fragment  -Patient has failed all conservative therapy and wishes to proceed with surgical intervention. All risks, benefits, and alternatives discussed with patient. No guarantees given. Consent reviewed and signed by patient. -Planned procedures: Excision of interdigital neuroma left third interspace, excision of nonunion left fibula -Risk factors: neuropathy, osteoporosis   No follow-ups on file.

## 2019-08-04 NOTE — Patient Instructions (Addendum)
Asthma Continue montelukast 10 mg once a day to prevent cough or wheeze Continue sample of AirDuo 1 puff twice a dayto prevent cough or wheeze. Continue albuterol 2 puffs every 4 hours as needed for cough or wheeze You may use albuterol 2 puffs 5-15 minutes before activity.  Allergic rhinitis Continue Nasacort 1-2 sprays in each nostril once a day as needed for a stuffy nose Continue RyVent 6 mg twice a day as needed for a runny nose or itch Continue azelastine 1-2 sprays in each nostril twice a day as needed for a runny nose or sneezing Continue nasal saline rinses as needed for nasal symptoms Continue allergen avoidance measures directed toward mold and dust mites as listed below  Allergic conjunctivitis Some over the counter eye drops include Pataday one drop in each eye once a day as needed for red, itchy eyes OR Zaditor one drop in each eye twice a day as needed for red itchy eyes.  Reflux Continue famotidine 20 mg twice a day for reflux Continue omeprazole 20 mg once a day as previously prescribed Continue dietary and lifestyle modifications as listed below  Oral candidiasis Begin Diflucan 150 mg one tablet once now and once in 7 days Continue to rinse your mouth after each inhaler use. If thrush continues to be a problem, we can consider changing your inhaler or using a nystatin suspension in the future.   Call the clinic if this treatment plan is not working well for you  Follow up in 4 months or sooner if needed.   Lifestyle Changes for Controlling GERD When you have GERD, stomach acid feels as if it's backing up toward your mouth. Whether or not you take medication to control your GERD, your symptoms can often be improved with lifestyle changes.   Raise Your Head  Reflux is more likely to strike when you're lying down flat, because stomach fluid can  flow backward more easily. Raising the head of your bed 4-6 inches can help. To do this:  Slide blocks or books under  the legs at the head of your bed. Or, place a wedge under  the mattress. Many foam stores can make a suitable wedge for you. The wedge  should run from your waist to the top of your head.  Don't just prop your head on several pillows. This increases pressure on your  stomach. It can make GERD worse.  Watch Your Eating Habits Certain foods may increase the acid in your stomach or relax the lower esophageal sphincter, making GERD more likely. It's best to avoid the following:  Coffee, tea, and carbonated drinks (with and without caffeine)  Fatty, fried, or spicy food  Mint, chocolate, onions, and tomatoes  Any other foods that seem to irritate your stomach or cause you pain  Relieve the Pressure  Eat smaller meals, even if you have to eat more often.  Don't lie down right after you eat. Wait a few hours for your stomach to empty.  Avoid tight belts and tight-fitting clothes.  Lose excess weight.  Tobacco and Alcohol  Avoid smoking tobacco and drinking alcohol. They can make GERD symptoms worse.  Control of Mold Allergen Mold and fungi can grow on a variety of surfaces provided certain temperature and moisture conditions exist.  Outdoor molds grow on plants, decaying vegetation and soil.  The major outdoor mold, Alternaria and Cladosporium, are found in very high numbers during hot and dry conditions.  Generally, a late Summer - Fall peak is seen  for common outdoor fungal spores.  Rain will temporarily lower outdoor mold spore count, but counts rise rapidly when the rainy period ends.  The most important indoor molds are Aspergillus and Penicillium.  Dark, humid and poorly ventilated basements are ideal sites for mold growth.  The next most common sites of mold growth are the bathroom and the kitchen.  Outdoor [employer] 1. Use air conditioning and keep windows closed 2. Avoid exposure to decaying vegetation. 3. Avoid leaf raking. 4. Avoid grain handling. 5. Consider  wearing a face mask if working in moldy areas.  Indoor Mold Control 1. Maintain humidity below 50%. 2. Clean washable surfaces with 5% bleach solution. 3. Remove sources e.g. Contaminated carpets.   Control of Dust Mite Allergen Dust mites play a major role in allergic asthma and rhinitis. They occur in environments with high humidity wherever human skin is found. Dust mites absorb humidity from the atmosphere (ie, they do not drink) and feed on organic matter (including shed human and animal skin). Dust mites are a microscopic type of insect that you cannot see with the naked eye. High levels of dust mites have been detected from mattresses, pillows, carpets, upholstered furniture, bed covers, clothes, soft toys and any woven material. The principal allergen of the dust mite is found in its feces. A gram of dust may contain 1,000 mites and 250,000 fecal particles. Mite antigen is easily measured in the air during house cleaning activities. Dust mites do not bite and do not cause harm to humans, other than by triggering allergies/asthma.  Ways to decrease your exposure to dust mites in your home:  1. Encase mattresses, box springs and pillows with a mite-impermeable barrier or cover  2. Wash sheets, blankets and drapes weekly in hot water (130 F) with detergent and dry them in a dryer on the hot setting.  3. Have the room cleaned frequently with a vacuum cleaner and a damp dust-mop. For carpeting or rugs, vacuuming with a vacuum cleaner equipped with a high-efficiency particulate air (HEPA) filter. The dust mite allergic individual should not be in a room which is being cleaned and should wait 1 hour after cleaning before going into the room.  4. Do not sleep on upholstered furniture (eg, couches).  5. If possible removing carpeting, upholstered furniture and drapery from the home is ideal. Horizontal blinds should be eliminated in the rooms where the person spends the most time (bedroom,  study, television room). Washable vinyl, roller-type shades are optimal.  6. Remove all non-washable stuffed toys from the bedroom. Wash stuffed toys weekly like sheets and blankets above.  7. Reduce indoor humidity to less than 50%. Inexpensive humidity monitors can be purchased at most hardware stores. Do not use a humidifier as can make the problem worse and are not recommended.

## 2019-08-04 NOTE — Progress Notes (Addendum)
[address] [place] [address] Dept: [phone]  FOLLOW UP NOTE  Patient ID: Heidi Walls, female    DOB: [birth year]  Age: 64 y.o. MRN: [id number] Date of Office Visit: 08/05/2019  Assessment  Chief Complaint: Asthma (coughing)  HPI Heidi Walls is a 64 year old female who presents to the clinic for follow-up visit.  She was last seen in this clinic on 06/05/2019 for evaluation of asthma, allergic rhinitis, and allergic conjunctivitis, and reflux.  At that time she reported her asthma had not been well controlled with symptoms including shortness of breath and a croupy sounding cough at which time she restarted her montelukast and began using AirDuo 55. At today's visit, she reports her asthma has been well controlled with no shortness of breath or wheeze with activity or rest.  She does report intermittent cough producing a small amount of phlegm.  She continues montelukast 10 mg once a day and AirDuo 55-1 puff twice a day.  She reports a mild irritation and burning sensation on the back of her tongue. She reports full compliance with rinsing her mouth after using her inhaler containing corticosteroid.  Allergic rhinitis is reported as well controlled with occasional nasal congestion in the evening for which she uses RyVent daily, Nasacort as needed, azelastine as needed, and saline on a semiregular basis.  Allergic conjunctivitis is reported as well controlled with Systane eyedrops as needed.  She is not currently using an allergy eyedrop.  Reflux is reported as moderately well controlled with alternating omeprazole with Nexium about every 3 weeks.  She is not currently taking famotidine.  Her current medications are listed in the chart  Drug Allergies:  Allergies  Allergen Reactions  . Codeine Shortness Of Breath    Pt reports this was 30 years ago.   . Nsaids   . Lisinopril Cough  . Sulfamethoxazole     Physical Exam: BP (!) 120/64   Pulse 104   Resp 16   SpO2 98%      Physical Exam Vitals reviewed.  Constitutional:      Appearance: Normal appearance.  HENT:     Head: Normocephalic and atraumatic.     Right Ear: Tympanic membrane normal.     Left Ear: Tympanic membrane normal.     Nose:     Comments: Bilateral nares slightly erythematous with clear nasal drainage noted.  Pharynx slightly erythematous with no exudate.  Ears normal.  Eyes normal.    Mouth/Throat:     Comments: Tongue reddened. No white patches noted  Eyes:     Conjunctiva/sclera: Conjunctivae normal.  Cardiovascular:     Rate and Rhythm: Normal rate and regular rhythm.     Heart sounds: Normal heart sounds. No murmur heard.   Pulmonary:     Effort: Pulmonary effort is normal.     Breath sounds: Normal breath sounds.     Comments: Lungs clear to auscultation Musculoskeletal:        General: Normal range of motion.     Cervical back: Normal range of motion and neck supple.  Skin:    General: Skin is warm and dry.  Neurological:     Mental Status: She is alert and oriented to person, place, and time.  Psychiatric:        Mood and Affect: Mood normal.        Behavior: Behavior normal.        Thought Content: Thought content normal.        Judgment:  Judgment normal.    Diagnostics: FVC 2.65, FEV1 2.08.  Predicted FVC 3.10, predicted FEV1 2.38.  Spirometry indicates normal ventilatory function.  Assessment and Plan: 1. Moderate persistent asthma without complication   2. Gastroesophageal reflux disease, unspecified whether esophagitis present   3. Perinnial allergic rhinitis with a probable non-allergic component   4. Seasonal allergic conjunctivitis   5. Oral candidiasis     Meds ordered this encounter  Medications  . fluconazole (DIFLUCAN) 150 MG tablet    Sig: Take one tablet today, and one table in 7 days    Dispense:  2 tablet    Refill:  0    Patient Instructions  Asthma Continue montelukast 10 mg once a day to prevent cough or wheeze Continue sample of  AirDuo 1 puff twice a dayto prevent cough or wheeze. Continue albuterol 2 puffs every 4 hours as needed for cough or wheeze You may use albuterol 2 puffs 5-15 minutes before activity.  Allergic rhinitis Continue Nasacort 1-2 sprays in each nostril once a day as needed for a stuffy nose Continue RyVent 6 mg twice a day as needed for a runny nose or itch Continue azelastine 1-2 sprays in each nostril twice a day as needed for a runny nose or sneezing Continue nasal saline rinses as needed for nasal symptoms Continue allergen avoidance measures directed toward mold and dust mites as listed below  Allergic conjunctivitis Some over the counter eye drops include Pataday one drop in each eye once a day as needed for red, itchy eyes OR Zaditor one drop in each eye twice a day as needed for red itchy eyes.  Reflux Continue famotidine 20 mg twice a day for reflux Continue omeprazole 20 mg once a day as previously prescribed Continue dietary and lifestyle modifications as listed below  Oral candidiasis Begin Diflucan 150 mg one tablet once now and once in 7 days Continue to rinse your mouth after each inhaler use. If thrush continues to be a problem, we can consider changing your inhaler  Call the clinic if this treatment plan is not working well for you  Follow up in 4 months or sooner if needed.    Return in about 4 months (around 12/06/2019), or if symptoms worsen or fail to improve.    Thank you for the opportunity to care for this patient.  Please do not hesitate to contact me with questions.  Gareth Morgan, FNP [facility]  ________________________________________________  I have provided oversight concerning Heidi Walls's evaluation and treatment of this patient's health issues addressed during today's encounter.  I agree with the assessment and therapeutic plan as outlined in the note.   Signed,   R Edgar Frisk, MD

## 2019-08-05 ENCOUNTER — Ambulatory Visit (INDEPENDENT_AMBULATORY_CARE_PROVIDER_SITE_OTHER): Payer: BC Managed Care – PPO | Admitting: Family Medicine

## 2019-08-05 ENCOUNTER — Other Ambulatory Visit: Payer: Self-pay

## 2019-08-05 ENCOUNTER — Encounter: Payer: Self-pay | Admitting: Family Medicine

## 2019-08-05 VITALS — BP 120/64 | HR 104 | Resp 16

## 2019-08-05 DIAGNOSIS — H1010 Acute atopic conjunctivitis, unspecified eye: Secondary | ICD-10-CM | POA: Diagnosis not present

## 2019-08-05 DIAGNOSIS — J4540 Moderate persistent asthma, uncomplicated: Secondary | ICD-10-CM | POA: Diagnosis not present

## 2019-08-05 DIAGNOSIS — J3089 Other allergic rhinitis: Secondary | ICD-10-CM | POA: Diagnosis not present

## 2019-08-05 DIAGNOSIS — K219 Gastro-esophageal reflux disease without esophagitis: Secondary | ICD-10-CM

## 2019-08-05 DIAGNOSIS — B370 Candidal stomatitis: Secondary | ICD-10-CM

## 2019-08-05 MED ORDER — FLUCONAZOLE 150 MG PO TABS
ORAL_TABLET | ORAL | 0 refills | Status: DC
Start: 2019-08-05 — End: 2019-10-26

## 2019-08-12 ENCOUNTER — Telehealth: Payer: Self-pay

## 2019-08-12 NOTE — Telephone Encounter (Signed)
Fidencia called to cancel her surgery for 10/07/19. She stated the pain comes and goes so she would like to wait and she needs to have knee surgery prior to foot surgery. Emailed Tour manager and notified Dr. March Rummage

## 2019-08-17 ENCOUNTER — Telehealth: Payer: Self-pay | Admitting: Podiatry

## 2019-08-19 ENCOUNTER — Telehealth: Payer: Self-pay | Admitting: Family Medicine

## 2019-08-19 NOTE — Telephone Encounter (Signed)
Pt. feels like she has infection, ears are full, coughing, no fever. mucinex not working. Also using inhalers and nose sprays.

## 2019-08-19 NOTE — Telephone Encounter (Signed)
Scheduled pt for a sick televisit tomorrow with [employer]

## 2019-08-19 NOTE — Telephone Encounter (Signed)
Shelly you and I have been playing phone tag for days. I left a message the other day to cancel my sx. I got approved for disability and will get Medicare as of 12/09/2019. So I will be scheduling my sx but it will not be until after I get my Medicare. I just wanted to let you know.

## 2019-08-19 NOTE — Telephone Encounter (Signed)
Please have her begin using a nasal saline rinse and Nasacort. Let's check on her tomorrow to see if she is improving or if she needs a visit. Thank you

## 2019-08-19 NOTE — Telephone Encounter (Signed)
Same symptoms as last uri she had in spring. She has a throbbing in her head, ears feel full, no fever, crackling when she coughs. Symptoms started Sunday. Taking monelukast, carbinoxamine, mucinex, airduo, and albuterol.

## 2019-08-20 ENCOUNTER — Ambulatory Visit (INDEPENDENT_AMBULATORY_CARE_PROVIDER_SITE_OTHER): Payer: BC Managed Care – PPO | Admitting: Family Medicine

## 2019-08-20 ENCOUNTER — Other Ambulatory Visit: Payer: Self-pay

## 2019-08-20 ENCOUNTER — Encounter: Payer: Self-pay | Admitting: Family Medicine

## 2019-08-20 DIAGNOSIS — B370 Candidal stomatitis: Secondary | ICD-10-CM

## 2019-08-20 DIAGNOSIS — H1010 Acute atopic conjunctivitis, unspecified eye: Secondary | ICD-10-CM

## 2019-08-20 DIAGNOSIS — H6993 Unspecified Eustachian tube disorder, bilateral: Secondary | ICD-10-CM

## 2019-08-20 DIAGNOSIS — J3089 Other allergic rhinitis: Secondary | ICD-10-CM | POA: Diagnosis not present

## 2019-08-20 DIAGNOSIS — J4540 Moderate persistent asthma, uncomplicated: Secondary | ICD-10-CM | POA: Diagnosis not present

## 2019-08-20 DIAGNOSIS — J019 Acute sinusitis, unspecified: Secondary | ICD-10-CM | POA: Diagnosis not present

## 2019-08-20 DIAGNOSIS — K219 Gastro-esophageal reflux disease without esophagitis: Secondary | ICD-10-CM

## 2019-08-20 DIAGNOSIS — H6983 Other specified disorders of Eustachian tube, bilateral: Secondary | ICD-10-CM

## 2019-08-20 MED ORDER — AZITHROMYCIN 250 MG PO TABS
ORAL_TABLET | ORAL | 0 refills | Status: DC
Start: 2019-08-20 — End: 2019-10-14

## 2019-08-20 MED ORDER — PREDNISONE 10 MG PO TABS: ORAL_TABLET | ORAL | 0 refills | Status: DC

## 2019-08-20 NOTE — Patient Instructions (Addendum)
Acute sinusitis Begin azithromycin 250 mg tablets. Take 2 on the first day, then take 1 tablet for the next 4 days, then stop Begin prednisone 10 mg tablets. Take 2 tablets once a day for the next 4 days, then take 1 tablet on the 5th day, then stop.  Eustachian tube dysfunction Continue nasal rinses and nasal steroid spray. Begin prednisone as above  Asthma Begin prednisone as above Continue montelukast 10 mg once a day to prevent cough or wheeze Continue AirDuo 55- take 1 puff twice a day to prevent cough or wheeze. Continue albuterol 2 puffs every 4 hours as needed for cough or wheeze You may use albuterol 2 puffs 5-15 minutes before activity to decrease cough or wheeze  Allergic rhinitis Continue Nasacort 1-2 sprays in each nostril once a day as needed for a stuffy nose Continue RyVent 6 mg twice a day as needed for a runny nose or itch Continue azelastine 1-2 sprays in each nostril twice a day as needed for a runny nose or sneezing Continue nasal saline rinses as needed for nasal symptoms Continue allergen avoidance measures directed toward mold and dust mites as listed below  Allergic conjunctivitis Some over the counter eye drops include Pataday one drop in each eye once a day as needed for red, itchy eyes OR Zaditor one drop in each eye twice a day as needed for red itchy eyes.  Reflux Continue famotidine 20 mg twice a day for reflux Continue omeprazole 20 mg once a day as previously prescribed Continue dietary and lifestyle modifications as listed below  Oral candidiasis Begin Diflucan 150 mg one tablet once now and once in 7 days Continue to rinse your mouth after each inhaler use. If thrush continues to be a problem, we can consider changing your inhaler or using a nystatin suspension in the future.   Call the clinic if this treatment plan is not working well for you, if you develop a fever, your symptoms worsen, or do not improve  Follow up in 2 months or sooner if  needed.  Lifestyle Changes for Controlling GERD When you have GERD, stomach acid feels as if it's backing up toward your mouth. Whether or not you take medication to control your GERD, your symptoms can often be improved with lifestyle changes.   Raise Your Head  Reflux is more likely to strike when you're lying down flat, because stomach fluid can  flow backward more easily. Raising the head of your bed 4-6 inches can help. To do this:  Slide blocks or books under the legs at the head of your bed. Or, place a wedge under  the mattress. Many foam stores can make a suitable wedge for you. The wedge  should run from your waist to the top of your head.  Don't just prop your head on several pillows. This increases pressure on your  stomach. It can make GERD worse.  Watch Your Eating Habits Certain foods may increase the acid in your stomach or relax the lower esophageal sphincter, making GERD more likely. It's best to avoid the following:  Coffee, tea, and carbonated drinks (with and without caffeine)  Fatty, fried, or spicy food  Mint, chocolate, onions, and tomatoes  Any other foods that seem to irritate your stomach or cause you pain  Relieve the Pressure  Eat smaller meals, even if you have to eat more often.  Don't lie down right after you eat. Wait a few hours for your stomach to empty.  Avoid  tight belts and tight-fitting clothes.  Lose excess weight.  Tobacco and Alcohol  Avoid smoking tobacco and drinking alcohol. They can make GERD symptoms worse.  Control of Mold Allergen Mold and fungi can grow on a variety of surfaces provided certain temperature and moisture conditions exist.  Outdoor molds grow on plants, decaying vegetation and soil.  The major outdoor mold, Alternaria and Cladosporium, are found in very high numbers during hot and dry conditions.  Generally, a late Summer - Fall peak is seen for common outdoor fungal spores.  Rain will temporarily lower  outdoor mold spore count, but counts rise rapidly when the rainy period ends.  The most important indoor molds are Aspergillus and Penicillium.  Dark, humid and poorly ventilated basements are ideal sites for mold growth.  The next most common sites of mold growth are the bathroom and the kitchen.  Outdoor [employer] 1. Use air conditioning and keep windows closed 2. Avoid exposure to decaying vegetation. 3. Avoid leaf raking. 4. Avoid grain handling. 5. Consider wearing a face mask if working in moldy areas.  Indoor Mold Control 1. Maintain humidity below 50%. 2. Clean washable surfaces with 5% bleach solution. 3. Remove sources e.g. Contaminated carpets.   Control of Dust Mite Allergen Dust mites play a major role in allergic asthma and rhinitis. They occur in environments with high humidity wherever human skin is found. Dust mites absorb humidity from the atmosphere (ie, they do not drink) and feed on organic matter (including shed human and animal skin). Dust mites are a microscopic type of insect that you cannot see with the naked eye. High levels of dust mites have been detected from mattresses, pillows, carpets, upholstered furniture, bed covers, clothes, soft toys and any woven material. The principal allergen of the dust mite is found in its feces. A gram of dust may contain 1,000 mites and 250,000 fecal particles. Mite antigen is easily measured in the air during house cleaning activities. Dust mites do not bite and do not cause harm to humans, other than by triggering allergies/asthma.  Ways to decrease your exposure to dust mites in your home:  1. Encase mattresses, box springs and pillows with a mite-impermeable barrier or cover  2. Wash sheets, blankets and drapes weekly in hot water (130 F) with detergent and dry them in a dryer on the hot setting.  3. Have the room cleaned frequently with a vacuum cleaner and a damp dust-mop. For carpeting or rugs, vacuuming with a vacuum  cleaner equipped with a high-efficiency particulate air (HEPA) filter. The dust mite allergic individual should not be in a room which is being cleaned and should wait 1 hour after cleaning before going into the room.  4. Do not sleep on upholstered furniture (eg, couches).  5. If possible removing carpeting, upholstered furniture and drapery from the home is ideal. Horizontal blinds should be eliminated in the rooms where the person spends the most time (bedroom, study, television room). Washable vinyl, roller-type shades are optimal.  6. Remove all non-washable stuffed toys from the bedroom. Wash stuffed toys weekly like sheets and blankets above.  7. Reduce indoor humidity to less than 50%. Inexpensive humidity monitors can be purchased at most hardware stores. Do not use a humidifier as can make the problem worse and are not recommended.

## 2019-08-20 NOTE — Progress Notes (Signed)
RE: JOETTA DELPRADO MRN: [id number] DOB: Jun 07, 1955 Date of Telemedicine Visit: 08/20/2019  Referring provider: Terald Sleeper, PA-C Primary care provider: Beckie Salts, MD  Chief Complaint: URI (Patient gave verbal consent to treat and bill insurance for this visit.)   Telemedicine Follow Up Visit via Telephone: I connected with Vieno Tarrant for a follow up on 08/20/19 by telephone and verified that I am speaking with the correct person using two identifiers.   I discussed the limitations, risks, security and privacy concerns of performing an evaluation and management service by telephone and the availability of in person appointments. I also discussed with the patient that there may be a patient responsible charge related to this service. The patient expressed understanding and agreed to proceed.  Patient is at home Provider is at the office.  Visit start time: 1010 Visit end time: [place] consent/check in by: [facility] Medical consent and medical assistant/nurse: Ashely  History of Present Illness: She is a 64 y.o. female, who is being followed for asthma, allergic rhinitis, reflux, and eustachian tube dysfunction. Her previous allergy office visit was on 08/05/2019 with Gareth Morgan, [place].  At today's visit she reports that beginning on Sunday she began to experience ear fullness, muffled hearing, nasal congestion, clear nasal drainage, copious postnasal drainage and dry cough.  She denies fever, chills, sweats, and sick contacts.  She has not experienced a change in her ability to taste or smell.  She continues carbinoxamine, Nasacort, and nasal saline rinses with no relief of symptoms.  Asthma is reported as moderately well controlled with no shortness of breath, some wheezing occurring equally during the day and night, and cough which is reported as dry.  She continues air duo 55-1 puff twice a day, montelukast 10 mg once a day, and using albuterol 2 times a day for the last several  days.  She reports that she occasionally experiences heartburn for which she takes famotidine 20 mg once a day.  She does report redness and irritation occurring on the back of her tongue.  Her current medications are listed in the chart.  Assessment and Plan: Madelaine is a 64 y.o. female with: Patient Instructions  Acute sinusitis Begin azithromycin 250 mg tablets. Take 2 on the first day, then take 1 tablet for the next 4 days, then stop Begin prednisone 10 mg tablets. Take 2 tablets once a day for the next 4 days, then take 1 tablet on the 5th day, then stop.  Asthma Begin prednisone as above Continue montelukast 10 mg once a day to prevent cough or wheeze Continue AirDuo 55- take 1 puff twice a day to prevent cough or wheeze. Continue albuterol 2 puffs every 4 hours as needed for cough or wheeze You may use albuterol 2 puffs 5-15 minutes before activity to decrease cough or wheeze  Allergic rhinitis Continue Nasacort 1-2 sprays in each nostril once a day as needed for a stuffy nose Continue RyVent 6 mg twice a day as needed for a runny nose or itch Continue azelastine 1-2 sprays in each nostril twice a day as needed for a runny nose or sneezing Continue nasal saline rinses as needed for nasal symptoms Continue allergen avoidance measures directed toward mold and dust mites as listed below  Allergic conjunctivitis Some over the counter eye drops include Pataday one drop in each eye once a day as needed for red, itchy eyes OR Zaditor one drop in each eye twice a day as needed for red itchy eyes.  Reflux Continue famotidine 20 mg twice a day for reflux Continue omeprazole 20 mg once a day as previously prescribed Continue dietary and lifestyle modifications as listed below  Oral candidiasis Begin Diflucan 150 mg one tablet once now and once in 7 days Continue to rinse your mouth after each inhaler use. If thrush continues to be a problem, we can consider changing your inhaler or using  a nystatin suspension in the future.   Call the clinic if this treatment plan is not working well for you, if you develop a fever, your symptoms worsen, or do not improve  Follow up in 2 months or sooner if needed.   Return in about 2 months (around 10/20/2019), or if symptoms worsen or fail to improve.  Meds ordered this encounter  Medications  . azithromycin (ZITHROMAX) 250 MG tablet    Sig: Take 500 mg on the first day, then 250 mg on day 2-5, then stop.    Dispense:  6 each    Refill:  0  . predniSONE (DELTASONE) 10 MG tablet    Sig: Take 2 tablets once a day for the next 4 days, then take 1 tablet on the 5th day, then stop.    Dispense:  9 tablet    Refill:  0   Lab Orders  No laboratory test(s) ordered today    Diagnostics: None.  Medication List:  Current Outpatient Medications  Medication Sig Dispense Refill  . acetaminophen (TYLENOL) 325 MG tablet Take 650 mg by mouth 2 (two) times daily.     Marland Kitchen albuterol (VENTOLIN HFA) 108 (90 Base) MCG/ACT inhaler INHALE 2 PUFFS INTO THE LUNGS EVERY 4 (FOUR) HOURS AS NEEDED FOR WHEEZING OR SHORTNESS OF BREATH. 18 g 1  . ALPRAZolam (XANAX) 0.5 MG tablet Take 1 tablet (0.5 mg total) by mouth 2 (two) times daily as needed for anxiety. 60 tablet 5  . azelastine (ASTELIN) 0.1 % nasal spray 1-2 sprays per nostril twice daily for runny nose (Patient taking differently: as needed. 1-2 sprays per nostril twice daily for runny nose) 30 mL 5  . Calcium Carb-Cholecalciferol (CALCIUM 600 + D PO) Take 1 tablet by mouth daily.    . Carbinoxamine Maleate 4 MG TABS 1 tablet q 8 hours for runny nose or itching 120 tablet 5  . cyclobenzaprine (FLEXERIL) 10 MG tablet Take 1 tablet (10 mg total) by mouth 3 (three) times daily as needed for muscle spasms (needs OV to est care with new provider). 60 tablet 0  . DULoxetine (CYMBALTA) 60 MG capsule Take 1 capsule (60 mg total) by mouth daily. 90 capsule 3  . esomeprazole (NEXIUM) 20 MG capsule Take 1 capsule  (20 mg total) by mouth daily. 90 capsule 0  . fluconazole (DIFLUCAN) 150 MG tablet Take one tablet today, and one table in 7 days 2 tablet 0  . Fluticasone-Salmeterol,sensor, (AIRDUO DIGIHALER) 55-14 MCG/ACT AEPB Inhale 1 puff into the lungs every 12 (twelve) hours. 1 each 5  . gabapentin (NEURONTIN) 800 MG tablet Take 1 tablet (800 mg total) by mouth 3 (three) times daily. (Patient taking differently: Take 800 mg by mouth 2 (two) times daily. ) 90 tablet 5  . letrozole (FEMARA) 2.5 MG tablet Take 1 tablet (2.5 mg total) by mouth daily. 90 tablet 3  . montelukast (SINGULAIR) 10 MG tablet Take 1 tablet (10 mg total) by mouth at bedtime. 90 tablet 3  . Multiple Vitamin (MULTIVITAMIN) tablet Take 1 tablet by mouth daily.    Marland Kitchen NASAL  SALINE NA Place into the nose. Nasal saline wash every morning    . olopatadine (PATANOL) 0.1 % ophthalmic solution Place 1 drop into both eyes 2 (two) times daily. (Patient taking differently: Place 1 drop into both eyes as needed. ) 15 mL 3  . Omega-3 Fatty Acids (FISH OIL) 1200 MG CAPS Take 1,200 mg by mouth daily.     Marland Kitchen omeprazole (PRILOSEC) 20 MG capsule Take 1 capsule (20 mg total) by mouth daily. 90 capsule 3  . Polyethyl Glycol-Propyl Glycol (SYSTANE OP) Apply to eye. 2-3 drops daily    . Probiotic Product (PROBIOTIC DAILY PO) Take by mouth.    . triamcinolone (NASACORT) 55 MCG/ACT AERO nasal inhaler Place 2 sprays into the nose daily. 1 Inhaler 5  . valACYclovir (VALTREX) 500 MG tablet TAKE 1 TABLET BY MOUTH TWICE A DAY 20 tablet 5  . vitamin B-12 (CYANOCOBALAMIN) 250 MCG tablet Take by mouth.    Marland Kitchen ZINC SULFATE PO Take by mouth.    Marland Kitchen azithromycin (ZITHROMAX) 250 MG tablet Take 500 mg on the first day, then 250 mg on day 2-5, then stop. 6 each 0  . famotidine (PEPCID) 20 MG tablet Take 1 tablet (20 mg total) by mouth 2 (two) times daily. (Patient not taking: Reported on 08/05/2019) 60 tablet 5  . predniSONE (DELTASONE) 10 MG tablet Take 2 tablets once a day for the  next 4 days, then take 1 tablet on the 5th day, then stop. 9 tablet 0   No current facility-administered medications for this visit.   Allergies: Allergies  Allergen Reactions  . Codeine Shortness Of Breath    Pt reports this was 30 years ago.   . Nsaids   . Lisinopril Cough  . Sulfamethoxazole    I reviewed her past medical history, social history, family history, and environmental history and no significant changes have been reported from previous visit on 08/05/2019.  Objective: Physical Exam Not obtained as encounter was done via telephone.   Previous notes and tests were reviewed.  I discussed the assessment and treatment plan with the patient. The patient was provided an opportunity to ask questions and all were answered. The patient agreed with the plan and demonstrated an understanding of the instructions.   The patient was advised to call back or seek an in-person evaluation if the symptoms worsen or if the condition fails to improve as anticipated.  I provided 33 minutes of non-face-to-face time during this encounter.  It was my pleasure to participate in [place] care today. Please feel free to contact me with any questions or concerns.   Sincerely,  Gareth Morgan, FNP

## 2019-08-21 DIAGNOSIS — N1831 Chronic kidney disease, stage 3a: Secondary | ICD-10-CM | POA: Diagnosis not present

## 2019-08-21 DIAGNOSIS — E6609 Other obesity due to excess calories: Secondary | ICD-10-CM | POA: Insufficient documentation

## 2019-08-21 DIAGNOSIS — E785 Hyperlipidemia, unspecified: Secondary | ICD-10-CM | POA: Diagnosis not present

## 2019-08-21 DIAGNOSIS — I129 Hypertensive chronic kidney disease with stage 1 through stage 4 chronic kidney disease, or unspecified chronic kidney disease: Secondary | ICD-10-CM | POA: Diagnosis not present

## 2019-08-21 DIAGNOSIS — Z6831 Body mass index (BMI) 31.0-31.9, adult: Secondary | ICD-10-CM | POA: Insufficient documentation

## 2019-08-21 DIAGNOSIS — R7309 Other abnormal glucose: Secondary | ICD-10-CM | POA: Diagnosis not present

## 2019-08-27 ENCOUNTER — Telehealth: Payer: Self-pay | Admitting: Family Medicine

## 2019-08-27 MED ORDER — NYSTATIN 100000 UNIT/ML MT SUSP
OROMUCOSAL | 0 refills | Status: DC
Start: 2019-08-27 — End: 2019-12-16

## 2019-08-27 NOTE — Telephone Encounter (Signed)
Let's try a different approach. Can you please call in nystatin swish and swallow suspension 100,000 units per mL. Use 5 mL 4 times a day for 1-2 weeks. Thank you

## 2019-08-27 NOTE — Telephone Encounter (Signed)
Medication sent, pt informed

## 2019-08-27 NOTE — Telephone Encounter (Signed)
Pt. Request refill for fluconazole (DIFLUCAN. Ritta Slot is not better

## 2019-08-27 NOTE — Telephone Encounter (Signed)
Please advise if okay to refill. 

## 2019-09-05 DIAGNOSIS — M19012 Primary osteoarthritis, left shoulder: Secondary | ICD-10-CM | POA: Diagnosis not present

## 2019-09-05 DIAGNOSIS — M7542 Impingement syndrome of left shoulder: Secondary | ICD-10-CM | POA: Diagnosis not present

## 2019-09-10 DIAGNOSIS — M7542 Impingement syndrome of left shoulder: Secondary | ICD-10-CM | POA: Diagnosis not present

## 2019-09-10 DIAGNOSIS — M5412 Radiculopathy, cervical region: Secondary | ICD-10-CM | POA: Diagnosis not present

## 2019-09-15 ENCOUNTER — Telehealth: Payer: Self-pay | Admitting: Family Medicine

## 2019-09-15 ENCOUNTER — Other Ambulatory Visit: Payer: Self-pay

## 2019-09-15 NOTE — Telephone Encounter (Signed)
Rx called into pt's pharmacy.  

## 2019-09-15 NOTE — Telephone Encounter (Signed)
Pt still has thrush,  bumps on inside of the gums and red bumps on tongue ulcers on the side of her tongue, tongue swells, taking benadryl every four hours. Going on vacation this wknd. You can call her anytime. If appt is need she prefers morning.

## 2019-09-15 NOTE — Telephone Encounter (Signed)
Please advise 

## 2019-09-15 NOTE — Telephone Encounter (Signed)
Please call in clotrimazole 10 mg troches: Slowly dissolve in the mouth 5 times per day for 10 days, not to be chewed or swallowed whole. Thanks.

## 2019-09-23 ENCOUNTER — Other Ambulatory Visit: Payer: Self-pay | Admitting: Family Medicine

## 2019-09-30 ENCOUNTER — Other Ambulatory Visit: Payer: Self-pay | Admitting: *Deleted

## 2019-10-09 ENCOUNTER — Ambulatory Visit: Payer: BC Managed Care – PPO | Admitting: Family Medicine

## 2019-10-12 ENCOUNTER — Encounter: Payer: BC Managed Care – PPO | Admitting: Podiatry

## 2019-10-12 DIAGNOSIS — M7542 Impingement syndrome of left shoulder: Secondary | ICD-10-CM | POA: Diagnosis not present

## 2019-10-12 DIAGNOSIS — G5622 Lesion of ulnar nerve, left upper limb: Secondary | ICD-10-CM | POA: Insufficient documentation

## 2019-10-12 DIAGNOSIS — M5412 Radiculopathy, cervical region: Secondary | ICD-10-CM | POA: Diagnosis not present

## 2019-10-13 NOTE — Progress Notes (Signed)
MY CHART  Virtual visit  Patient Care Team: Patria Mane, MD as PCP - General (Internal Medicine) Serena Croissant, MD as Consulting Physician (Hematology and Oncology) Axel Filler, Larna Daughters, NP as Nurse Practitioner (Hematology and Oncology) Glenna Fellows, MD as Consulting Physician (Plastic Surgery) Glenna Fellows, MD (Inactive) as Consulting Physician (General Surgery)  DIAGNOSIS:    ICD-10-CM   1. Malignant neoplasm of upper-inner quadrant of left breast in female, estrogen receptor positive (HCC)  C50.212    Z17.0     SUMMARY OF ONCOLOGIC HISTORY: Oncology History  Breast cancer of upper-inner quadrant of left female breast (HCC)  05/26/2015 Initial Diagnosis   Screen detected left breast asymmetry (posterior 1.6 cm): Grade 2-3 IDC ER/PR positive HER-2 positive Ki-67 20% plus calcs (span 6.1 cm): High-grade DCIS with suspicious foci of invasion (4.2 cm apart)   06/24/2015 Surgery   Left simple mastectomy with reconstruction: IDC grade 3, 2.5 cm, with high-grade DCIS, ALH, LVI present, margins negative, 0/1 sentinel node negative, T2 N0 stage II a, ER 100%, PR 5%, HER-2 positive ratio 1.56 with average copy #8.25, Ki-67 20%   07/25/2015 - 11/03/2015 Chemotherapy   Adjuvant chemotherapy with TCH Perjeta 6 cycles followed by Herceptin maintenance for 1 year   11/28/2015 -  Anti-estrogen oral therapy   Adjuvant anastrozole 1 mg daily; switched to exemestane on 07/08/18     CHIEF COMPLIANT: Follow-up of left breast cancer on exemestane therapy   INTERVAL HISTORY: Heidi Walls is a 64 y.o. with above-mentioned history of left breast cancer treated with mastectomy, adjuvant chemotherapy, and is currently on letrozole therapy after she could not tolerate anastrozole.She presents to the clinic today for follow-up.   She is complaining of profound weight gain related to letrozole therapy.  She does have muscle aches and pains because of which her exercise is  limited.  ALLERGIES:  is allergic to codeine, nsaids, lisinopril, and sulfamethoxazole.  MEDICATIONS:  Current Outpatient Medications  Medication Sig Dispense Refill  . acetaminophen (TYLENOL) 325 MG tablet Take 650 mg by mouth 2 (two) times daily.     Marland Kitchen albuterol (VENTOLIN HFA) 108 (90 Base) MCG/ACT inhaler INHALE 2 PUFFS INTO THE LUNGS EVERY 4 (FOUR) HOURS AS NEEDED FOR WHEEZING OR SHORTNESS OF BREATH. 18 g 1  . ALPRAZolam (XANAX) 0.5 MG tablet Take 1 tablet (0.5 mg total) by mouth 2 (two) times daily as needed for anxiety. 60 tablet 5  . azelastine (ASTELIN) 0.1 % nasal spray 1-2 sprays per nostril twice daily for runny nose (Patient taking differently: as needed. 1-2 sprays per nostril twice daily for runny nose) 30 mL 5  . azithromycin (ZITHROMAX) 250 MG tablet Take 500 mg on the first day, then 250 mg on day 2-5, then stop. 6 each 0  . Calcium Carb-Cholecalciferol (CALCIUM 600 + D PO) Take 1 tablet by mouth daily.    . Carbinoxamine Maleate 4 MG TABS 1 tablet q 8 hours for runny nose or itching 120 tablet 5  . cyclobenzaprine (FLEXERIL) 10 MG tablet Take 1 tablet (10 mg total) by mouth 3 (three) times daily as needed for muscle spasms (needs OV to est care with new provider). 60 tablet 0  . DULoxetine (CYMBALTA) 60 MG capsule Take 1 capsule (60 mg total) by mouth daily. 90 capsule 3  . esomeprazole (NEXIUM) 20 MG capsule Take 1 capsule (20 mg total) by mouth daily. 90 capsule 0  . famotidine (PEPCID) 20 MG tablet Take 1 tablet (20 mg total) by mouth 2 (two)  times daily. (Patient not taking: Reported on 08/05/2019) 60 tablet 5  . fluconazole (DIFLUCAN) 150 MG tablet Take one tablet today, and one table in 7 days 2 tablet 0  . Fluticasone-Salmeterol,sensor, (AIRDUO DIGIHALER) 55-14 MCG/ACT AEPB Inhale 1 puff into the lungs every 12 (twelve) hours. 1 each 5  . gabapentin (NEURONTIN) 800 MG tablet Take 1 tablet (800 mg total) by mouth 3 (three) times daily. (Patient taking differently: Take 800  mg by mouth 2 (two) times daily. ) 90 tablet 5  . letrozole (FEMARA) 2.5 MG tablet Take 1 tablet (2.5 mg total) by mouth daily. 90 tablet 3  . montelukast (SINGULAIR) 10 MG tablet TAKE 1 TABLET BY MOUTH EVERYDAY AT BEDTIME 90 tablet 0  . Multiple Vitamin (MULTIVITAMIN) tablet Take 1 tablet by mouth daily.    Marland Kitchen NASAL SALINE NA Place into the nose. Nasal saline wash every morning    . nystatin (MYCOSTATIN) 100000 UNIT/ML suspension Swish and swallow 67mL 4 times a day for 1-2 weeks 280 mL 0  . olopatadine (PATANOL) 0.1 % ophthalmic solution Place 1 drop into both eyes 2 (two) times daily. (Patient taking differently: Place 1 drop into both eyes as needed. ) 15 mL 3  . Omega-3 Fatty Acids (FISH OIL) 1200 MG CAPS Take 1,200 mg by mouth daily.     Marland Kitchen omeprazole (PRILOSEC) 20 MG capsule Take 1 capsule (20 mg total) by mouth daily. 90 capsule 3  . Polyethyl Glycol-Propyl Glycol (SYSTANE OP) Apply to eye. 2-3 drops daily    . predniSONE (DELTASONE) 10 MG tablet Take 2 tablets once a day for the next 4 days, then take 1 tablet on the 5th day, then stop. 9 tablet 0  . Probiotic Product (PROBIOTIC DAILY PO) Take by mouth.    . triamcinolone (NASACORT) 55 MCG/ACT AERO nasal inhaler Place 2 sprays into the nose daily. 1 Inhaler 5  . valACYclovir (VALTREX) 500 MG tablet TAKE 1 TABLET BY MOUTH TWICE A DAY 20 tablet 5  . vitamin B-12 (CYANOCOBALAMIN) 250 MCG tablet Take by mouth.    Marland Kitchen ZINC SULFATE PO Take by mouth.     No current facility-administered medications for this visit.    PHYSICAL EXAMINATION: ECOG PERFORMANCE STATUS: 1 - Symptomatic but completely ambulatory      LABORATORY DATA:  I have reviewed the data as listed CMP Latest Ref Rng & Units 07/08/2018 01/13/2018 11/04/2017  Glucose 70 - 99 mg/dL 84 110(H) 105(H)  BUN 8 - 23 mg/dL 18 20 46(H)  Creatinine 0.44 - 1.00 mg/dL 1.24(H) 1.21(H) 1.57(H)  Sodium 135 - 145 mmol/L 139 136 134  Potassium 3.5 - 5.1 mmol/L 3.1(L) 4.4 5.2  Chloride 98 - 111  mmol/L 98 98 98  CO2 22 - 32 mmol/L $RemoveB'28 25 22  'GxaHIXoG$ Calcium 8.9 - 10.3 mg/dL 9.9 10.4(H) 9.3  Total Protein 6.5 - 8.1 g/dL 7.9 7.0 6.7  Total Bilirubin 0.3 - 1.2 mg/dL 0.2(L) <0.2 <0.2  Alkaline Phos 38 - 126 U/L 112 93 80  AST 15 - 41 U/L 19 18 51(H)  ALT 0 - 44 U/L 20 19 32    Lab Results  Component Value Date   WBC 7.7 10/23/2018   HGB 11.0 (L) 10/23/2018   HCT 34.1 10/23/2018   MCV 85 10/23/2018   PLT 369 10/23/2018   NEUTROABS 5.4 10/23/2018    ASSESSMENT & PLAN:  Breast cancer of upper-inner quadrant of left female breast ([place]) Left simple mastectomy with reconstruction 06/24/2015: IDC grade 3, 2.5  cm, with high-grade DCIS, ALH, LVI present, margins negative, 0/1 sentinel node negative, , ER 100%, PR 5%, HER-2 positive ratio 1.56 with average copy #8.25, Ki-67 20%  Pathologic stage:T2 N0 stage II a Treatment plan: 1. Adjuvant chemotherapy with TCHP X 6 completed 10/30/17followed by Herceptin maintenance for 1 year which completed 08/07/2016 2. Followed by adjuvant hormonal therapy with anastrozole started 11/28/2015 ---------------------------------------------------------------------------------------------------------- Current treatment:  Echocardiogram 05/22/16 LVEF 60-65% Anastrozole started 11/28/15 switched to letrozole 07/08/2018  Letrozole toxicities: 1. Memory problems 2. Body aches and pains  Bone density test 04/12/2016: T score -2.2. Osteopenia. Off Fosamax. Will resume Oct 2022. After another bone density. Takes calcium and vitamin D. Left foot fractures from walking 5K and ankle fracture at another time (wears braces)  COVID infection Feb 2021  Breast cancer surveillance:  Right breast mammogram 07/15/19  Benign at [facility] breast density category C Breast Exam:  Benign  Anxiety: On antianxiety medications  I will see her back in 1 year for follow-up    No orders of the defined types were placed in this encounter.  The patient has a good  understanding of the overall plan. she agrees with it. she will call with any problems that may develop before the next visit here.  Total time spent: 20 mins including face to face time and time spent for planning, charting and coordination of care  Nicholas Lose, MD 10/14/2019  I, Cloyde Reams Dorshimer, am acting as scribe for Dr. Nicholas Lose.  I have reviewed the above documentation for accuracy and completeness, and I agree with the above.

## 2019-10-14 ENCOUNTER — Inpatient Hospital Stay: Payer: BC Managed Care – PPO | Attending: Hematology and Oncology | Admitting: Hematology and Oncology

## 2019-10-14 DIAGNOSIS — Z8616 Personal history of COVID-19: Secondary | ICD-10-CM

## 2019-10-14 DIAGNOSIS — Z9221 Personal history of antineoplastic chemotherapy: Secondary | ICD-10-CM

## 2019-10-14 DIAGNOSIS — M8580 Other specified disorders of bone density and structure, unspecified site: Secondary | ICD-10-CM | POA: Diagnosis not present

## 2019-10-14 DIAGNOSIS — F419 Anxiety disorder, unspecified: Secondary | ICD-10-CM

## 2019-10-14 DIAGNOSIS — Z79811 Long term (current) use of aromatase inhibitors: Secondary | ICD-10-CM

## 2019-10-14 DIAGNOSIS — Z9012 Acquired absence of left breast and nipple: Secondary | ICD-10-CM

## 2019-10-14 DIAGNOSIS — Z170 Estrogen receptor positive status [ER+]: Secondary | ICD-10-CM

## 2019-10-14 DIAGNOSIS — C50212 Malignant neoplasm of upper-inner quadrant of left female breast: Secondary | ICD-10-CM | POA: Diagnosis not present

## 2019-10-14 DIAGNOSIS — R635 Abnormal weight gain: Secondary | ICD-10-CM

## 2019-10-14 DIAGNOSIS — M791 Myalgia, unspecified site: Secondary | ICD-10-CM

## 2019-10-14 DIAGNOSIS — R413 Other amnesia: Secondary | ICD-10-CM

## 2019-10-14 MED ORDER — LETROZOLE 2.5 MG PO TABS: 2.5000 mg | ORAL_TABLET | Freq: Every day | ORAL | 3 refills | Status: DC

## 2019-10-14 NOTE — Assessment & Plan Note (Signed)
Left simple mastectomy with reconstruction 06/24/2015: IDC grade 3, 2.5 cm, with high-grade DCIS, ALH, LVI present, margins negative, 0/1 sentinel node negative, , ER 100%, PR 5%, HER-2 positive ratio 1.56 with average copy #8.25, Ki-67 20%  Pathologic stage:T2 N0 stage II a Treatment plan: 1. Adjuvant chemotherapy with TCHP X 6 completed 10/30/17followed by Herceptin maintenance for 1 year which completed 08/07/2016 2. Followed by adjuvant hormonal therapy with anastrozole started 11/28/2015 ---------------------------------------------------------------------------------------------------------- Current treatment:  Echocardiogram 05/22/16 LVEF 60-65% Anastrozole started 11/28/15 switched to letrozole 07/08/2018  Letrozole toxicities: 1. Memory problems 2. Body aches and pains  Bone density test 04/12/2016: T score -2.2. Osteopenia. She is taking Fosamax and calcium and vitamin D. Left foot fractures from walking 5K  Breast cancer surveillance:  Right breast mammogram 06/2018: Benign at [facility] breast density category C Breast Exam: 10/14/19: Benign  Anxiety: On antianxiety medications  I will see her back in 1 year for follow-up

## 2019-10-16 ENCOUNTER — Telehealth: Payer: Self-pay | Admitting: Hematology and Oncology

## 2019-10-16 ENCOUNTER — Other Ambulatory Visit: Payer: Self-pay

## 2019-10-16 NOTE — Telephone Encounter (Signed)
Scheduled per 10/6 los. Called and left a msg  

## 2019-10-19 ENCOUNTER — Encounter: Payer: BC Managed Care – PPO | Admitting: Podiatry

## 2019-10-26 ENCOUNTER — Other Ambulatory Visit: Payer: Self-pay

## 2019-10-26 ENCOUNTER — Ambulatory Visit (INDEPENDENT_AMBULATORY_CARE_PROVIDER_SITE_OTHER): Payer: BC Managed Care – PPO | Admitting: Family Medicine

## 2019-10-26 ENCOUNTER — Encounter: Payer: Self-pay | Admitting: Family Medicine

## 2019-10-26 VITALS — BP 130/78 | HR 94 | Temp 98.1°F | Resp 16

## 2019-10-26 DIAGNOSIS — K219 Gastro-esophageal reflux disease without esophagitis: Secondary | ICD-10-CM | POA: Diagnosis not present

## 2019-10-26 DIAGNOSIS — J4540 Moderate persistent asthma, uncomplicated: Secondary | ICD-10-CM | POA: Diagnosis not present

## 2019-10-26 DIAGNOSIS — H1010 Acute atopic conjunctivitis, unspecified eye: Secondary | ICD-10-CM | POA: Diagnosis not present

## 2019-10-26 DIAGNOSIS — J3089 Other allergic rhinitis: Secondary | ICD-10-CM

## 2019-10-26 NOTE — Patient Instructions (Addendum)
Asthma Continue montelukast 10 mg once a day to prevent cough or wheeze Continue albuterol 2 puffs every 4 hours as needed for cough or wheeze You may use albuterol 2 puffs 5-15 minutes before activity to decrease cough or wheeze For asthma flare, begin Alvesco 80- 1 puff twice a day and call the clinic  Allergic rhinitis Continue Nasacort 1-2 sprays in each nostril once a day as needed for a stuffy nose Continue RyVent 6 mg twice a day as needed for a runny nose or itch Continue azelastine 1-2 sprays in each nostril twice a day as needed for a runny nose or sneezing Continue nasal saline rinses as needed for nasal symptoms Continue allergen avoidance measures directed toward mold and dust mites as listed below  Allergic conjunctivitis Some over the counter eye drops include Pataday one drop in each eye once a day as needed for red, itchy eyes OR Zaditor one drop in each eye twice a day as needed for red itchy eyes.  Reflux Continue famotidine 20 mg twice a day for reflux Continue omeprazole 20 mg once a day as previously prescribed Continue dietary and lifestyle modifications as listed below  Call the clinic if this treatment plan is not working well for you, if you develop a fever, your symptoms worsen, or do not improve  Follow up in 4 months or sooner if needed.  Lifestyle Changes for Controlling GERD When you have GERD, stomach acid feels as if it's backing up toward your mouth. Whether or not you take medication to control your GERD, your symptoms can often be improved with lifestyle changes.   Raise Your Head  Reflux is more likely to strike when you're lying down flat, because stomach fluid can  flow backward more easily. Raising the head of your bed 4-6 inches can help. To do this:  Slide blocks or books under the legs at the head of your bed. Or, place a wedge under  the mattress. Many foam stores can make a suitable wedge for you. The wedge  should run from your  waist to the top of your head.  Don't just prop your head on several pillows. This increases pressure on your  stomach. It can make GERD worse.  Watch Your Eating Habits Certain foods may increase the acid in your stomach or relax the lower esophageal sphincter, making GERD more likely. It's best to avoid the following:  Coffee, tea, and carbonated drinks (with and without caffeine)  Fatty, fried, or spicy food  Mint, chocolate, onions, and tomatoes  Any other foods that seem to irritate your stomach or cause you pain  Relieve the Pressure  Eat smaller meals, even if you have to eat more often.  Don't lie down right after you eat. Wait a few hours for your stomach to empty.  Avoid tight belts and tight-fitting clothes.  Lose excess weight.  Tobacco and Alcohol  Avoid smoking tobacco and drinking alcohol. They can make GERD symptoms worse.  Control of Mold Allergen Mold and fungi can grow on a variety of surfaces provided certain temperature and moisture conditions exist.  Outdoor molds grow on plants, decaying vegetation and soil.  The major outdoor mold, Alternaria and Cladosporium, are found in very high numbers during hot and dry conditions.  Generally, a late Summer - Fall peak is seen for common outdoor fungal spores.  Rain will temporarily lower outdoor mold spore count, but counts rise rapidly when the rainy period ends.  The most important indoor molds  are Aspergillus and Penicillium.  Dark, humid and poorly ventilated basements are ideal sites for mold growth.  The next most common sites of mold growth are the bathroom and the kitchen.  Outdoor [employer] 1. Use air conditioning and keep windows closed 2. Avoid exposure to decaying vegetation. 3. Avoid leaf raking. 4. Avoid grain handling. 5. Consider wearing a face mask if working in moldy areas.  Indoor Mold Control 1. Maintain humidity below 50%. 2. Clean washable surfaces with 5% bleach solution. 3. Remove  sources e.g. Contaminated carpets.   Control of Dust Mite Allergen Dust mites play a major role in allergic asthma and rhinitis. They occur in environments with high humidity wherever human skin is found. Dust mites absorb humidity from the atmosphere (ie, they do not drink) and feed on organic matter (including shed human and animal skin). Dust mites are a microscopic type of insect that you cannot see with the naked eye. High levels of dust mites have been detected from mattresses, pillows, carpets, upholstered furniture, bed covers, clothes, soft toys and any woven material. The principal allergen of the dust mite is found in its feces. A gram of dust may contain 1,000 mites and 250,000 fecal particles. Mite antigen is easily measured in the air during house cleaning activities. Dust mites do not bite and do not cause harm to humans, other than by triggering allergies/asthma.  Ways to decrease your exposure to dust mites in your home:  1. Encase mattresses, box springs and pillows with a mite-impermeable barrier or cover  2. Wash sheets, blankets and drapes weekly in hot water (130 F) with detergent and dry them in a dryer on the hot setting.  3. Have the room cleaned frequently with a vacuum cleaner and a damp dust-mop. For carpeting or rugs, vacuuming with a vacuum cleaner equipped with a high-efficiency particulate air (HEPA) filter. The dust mite allergic individual should not be in a room which is being cleaned and should wait 1 hour after cleaning before going into the room.  4. Do not sleep on upholstered furniture (eg, couches).  5. If possible removing carpeting, upholstered furniture and drapery from the home is ideal. Horizontal blinds should be eliminated in the rooms where the person spends the most time (bedroom, study, television room). Washable vinyl, roller-type shades are optimal.  6. Remove all non-washable stuffed toys from the bedroom. Wash stuffed toys weekly like sheets  and blankets above.  7. Reduce indoor humidity to less than 50%. Inexpensive humidity monitors can be purchased at most hardware stores. Do not use a humidifier as can make the problem worse and are not recommended.

## 2019-10-26 NOTE — Addendum Note (Signed)
Addended by: Di Kindle D on: 10/26/2019 02:47 PM   Modules accepted: Orders

## 2019-10-26 NOTE — Progress Notes (Addendum)
[address] [place]  [address] Dept: [phone]  FOLLOW UP NOTE  Patient ID: Heidi Walls, female    DOB: 04-Aug-1955  Age: 64 y.o. MRN: [id number] Date of Office Visit: 10/26/2019  Assessment  Chief Complaint: Asthma  HPI Heidi Walls is a 64 year old female who presents the clinic for follow-up visit.  She was last seen in clinic on 08/20/1999 via telephone visit for evaluation of asthma, allergic rhinitis, reflux, acute sinusitis, and allergic conjunctivitis.  At today's visit she reports her asthma has been very well controlled with no shortness of breath, cough, or wheeze with activity or rest.  She continues montelukast 10 mg once a day and has not needed to use air duo or albuterol over 1 month.  Allergic rhinitis is reported as well controlled with occasional postnasal drainage for which she continues to 6 mg twice a day and saline nasal rinses daily.  She is not currently using Nasacort or azelastine nasal spray.  Reflux is reported as well controlled with no symptoms including heartburn or vomiting.  She continues famotidine 20 mg once a day.  Allergic rhinitis is reported as well controlled with no medical intervention at this time.   Drug Allergies:  Allergies  Allergen Reactions  . Codeine Shortness Of Breath    Pt reports this was 30 years ago.   . Nsaids   . Lisinopril Cough  . Bee Pollen Other (See Comments)  . Pollen Extract   . Sulfamethoxazole     Physical Exam: BP 130/78   Pulse 94   Temp 98.1 F (36.7 C) (Tympanic)   Resp 16   SpO2 99%    Physical Exam Vitals reviewed.  Constitutional:      Appearance: Normal appearance.  HENT:     Head: Normocephalic and atraumatic.     Right Ear: Tympanic membrane normal.     Left Ear: Tympanic membrane normal.     Nose:     Comments: Bilateral nares normal.  Septal deviation noted.  Pharynx normal.  Ears normal.  Eyes normal.    Mouth/Throat:     Pharynx: Oropharynx is clear.  Eyes:      Conjunctiva/sclera: Conjunctivae normal.  Cardiovascular:     Rate and Rhythm: Normal rate and regular rhythm.     Heart sounds: Normal heart sounds. No murmur heard.   Pulmonary:     Effort: Pulmonary effort is normal.     Breath sounds: Normal breath sounds.     Comments: Lungs clear to auscultation Musculoskeletal:        General: Normal range of motion.     Cervical back: Normal range of motion and neck supple.  Skin:    General: Skin is warm and dry.  Neurological:     Mental Status: She is alert and oriented to person, place, and time.  Psychiatric:        Mood and Affect: Mood normal.        Behavior: Behavior normal.        Thought Content: Thought content normal.        Judgment: Judgment normal.     Diagnostics: FVC 2.72, FEV1 2.01.  Predicted FVC 3.10, predicted FEV1 2.38.  Spirometry indicates normal ventilatory function.  Assessment and Plan: 1. Moderate persistent asthma without complication   2. Perinnial allergic rhinitis with a probable non-allergic component   3. Seasonal allergic conjunctivitis   4. Gastroesophageal reflux disease, unspecified whether esophagitis present     Patient Instructions  Asthma  Continue montelukast 10 mg once a day to prevent cough or wheeze Continue albuterol 2 puffs every 4 hours as needed for cough or wheeze You may use albuterol 2 puffs 5-15 minutes before activity to decrease cough or wheeze For asthma flare, begin Alvesco 80- 1 puff twice a day and call the clinic  Allergic rhinitis Continue Nasacort 1-2 sprays in each nostril once a day as needed for a stuffy nose Continue RyVent 6 mg twice a day as needed for a runny nose or itch Continue azelastine 1-2 sprays in each nostril twice a day as needed for a runny nose or sneezing Continue nasal saline rinses as needed for nasal symptoms Continue allergen avoidance measures directed toward mold and dust mites as listed below  Allergic conjunctivitis Some over the counter  eye drops include Pataday one drop in each eye once a day as needed for red, itchy eyes OR Zaditor one drop in each eye twice a day as needed for red itchy eyes.  Reflux Continue famotidine 20 mg twice a day for reflux Continue omeprazole 20 mg once a day as previously prescribed Continue dietary and lifestyle modifications as listed below  Call the clinic if this treatment plan is not working well for you, if you develop a fever, your symptoms worsen, or do not improve  Follow up in 4 months or sooner if needed.   Return in about 4 months (around 02/26/2020), or if symptoms worsen or fail to improve.    Thank you for the opportunity to care for this patient.  Please do not hesitate to contact me with questions.  Gareth Morgan, FNP [facility]  ________________________________________________  I have provided oversight concerning Heidi Walls's evaluation and treatment of this patient's health issues addressed during today's encounter.  I agree with the assessment and therapeutic plan as outlined in the note.   Signed,   R Edgar Frisk, MD

## 2019-11-02 ENCOUNTER — Encounter: Payer: BC Managed Care – PPO | Admitting: Podiatry

## 2019-11-03 ENCOUNTER — Encounter: Payer: Self-pay | Admitting: Hematology and Oncology

## 2019-11-11 ENCOUNTER — Telehealth: Payer: Self-pay | Admitting: Family Medicine

## 2019-11-11 NOTE — Telephone Encounter (Signed)
Pt. Called back and stated she is having symptoms of an upper respiratory infection. She is taking the albuterol more than twice a week and also taking mucinex. Pt made aware  as stated to restart AirDuo-55- 1 puff twice a day.

## 2019-11-11 NOTE — Telephone Encounter (Signed)
Please advise to what pt stated

## 2019-11-11 NOTE — Telephone Encounter (Signed)
Left message for pt to call us back. 

## 2019-11-11 NOTE — Telephone Encounter (Signed)
Heidi Walls quit taking every day inhaler. only using rescue inhaler but the weather change is making her cough alot. does not want to over use rescue inhaler.

## 2019-11-11 NOTE — Telephone Encounter (Signed)
Does she have AirDuo or Alvesco 80? The last note says "for asthma flare, begin Alvesco 80-1 puff twice a day". Please have her continue Nasacort 1-2 sprays in each nostril once a day as needed for a stuffy nose Continue RyVent 6 mg twice a day as needed for a runny nose or itch Continue azelastine 1-2 sprays in each nostril twice a day as needed for a runny nose or sneezing Continue nasal saline rinses as needed for nasal symptoms. Please have her call with any worsening symptoms or if she develops a fever. Thank you

## 2019-11-11 NOTE — Telephone Encounter (Signed)
Can you please find out if she is having any symptoms other than cough? If she is using her albuterol more than twice a week, please have her restart AirDuo 55- 1 puff twice a day. Thank you

## 2019-11-20 ENCOUNTER — Other Ambulatory Visit: Payer: Self-pay | Admitting: Family Medicine

## 2019-11-20 MED ORDER — CLOTRIMAZOLE 10 MG MT TROC
OROMUCOSAL | 0 refills | Status: DC
Start: 2019-11-20 — End: 2020-04-25

## 2019-11-20 NOTE — Telephone Encounter (Signed)
Called patient and her symptoms are dry throat and white tongue. She said the lozenges works best and they're cheap. Please advise. Thanks

## 2019-11-20 NOTE — Telephone Encounter (Signed)
She can definitely have another sample of Alvesco. Will be available for pick-up at the front desk. Please ask what symptoms she is having that she needs more clotrimazole troches. Thank you

## 2019-11-20 NOTE — Telephone Encounter (Signed)
Ok. Can you please call in clotrimazole 10 mg troches- dissolve one troch in the mouth 5 times a day for 7 days. Please have her continue to rinse after each use of her inhaler. Please let her know that Alvesco is a pro-drug which is not activated until it gets to her lung tissue so there should be fewer issues with oral thrush with this medication (hopefully). If she continues to have thrush while using the Alvesco inhaler in the future we should look into other causes at that time. Thank you

## 2019-11-20 NOTE — Telephone Encounter (Signed)
Please advise 

## 2019-11-20 NOTE — Telephone Encounter (Signed)
Spoke with patient and verbally gave instructions. Medication sent to [facility]. Alvesco sample left at front desk for patient to pick up.

## 2019-11-20 NOTE — Telephone Encounter (Signed)
PT asking for [place] to refill the thrush medicine clotrimazole 10mg  to [facility] on [address].  PT also wanted to let Webb Silversmith know she likes Alvesco way better than the AirDuo. Would like more samples. Her insurance doesn't start until December 1st so samples would help a lot.

## 2019-12-07 ENCOUNTER — Ambulatory Visit: Payer: BC Managed Care – PPO | Admitting: Family Medicine

## 2019-12-09 ENCOUNTER — Telehealth: Payer: Self-pay | Admitting: Family Medicine

## 2019-12-09 NOTE — Telephone Encounter (Signed)
Pt. Has no more samples, asking if she can get another sample of Alvesco or can she wait until her appt next wed.

## 2019-12-09 NOTE — Telephone Encounter (Signed)
Spoke with pt and informed her that sample of alvesco is left at front desk for her. She stated understanding

## 2019-12-14 ENCOUNTER — Other Ambulatory Visit: Payer: Self-pay | Admitting: Family Medicine

## 2019-12-15 NOTE — Progress Notes (Addendum)
[address] [place] [address] Dept: [phone]  FOLLOW UP NOTE  Patient ID: Heidi Walls, female    DOB: Jan 07, 1956  Age: 64 y.o. MRN: [id number] Date of Office Visit: 12/16/2019  Assessment  Chief Complaint: Asthma  HPI Heidi Walls is a 64 year old female who presents to the clinic for follow-up visit.  She was last seen in this clinic on 10/26/2019 for evaluation of asthma, allergic rhinitis, allergic conjunctivitis, and reflux.  At today's visit, she reports her asthma has been well controlled with no shortness of breath, cough, or wheeze.  She continues montelukast 10 mg once a day, Alvesco 80-1 puff twice a day, and rarely needs albuterol.  Allergic rhinitis is reported as moderately well controlled with clear rhinorrhea for which she continues Nasacort, RyVent, azelastine, and frequent nasal saline rinses. Reflux is reported as well controlled with no symptoms including heartburn or vomiting with famotidine 20 mg twice a day, and omeprazole 20 mg once a day.  Allergic conjunctivitis is reported as well controlled with occasional Pataday.  Her current medications are listed in the chart.   Drug Allergies:  Allergies  Allergen Reactions  . Codeine Shortness Of Breath    Pt reports this was 30 years ago.   . Nsaids   . Lisinopril Cough  . Bee Pollen Other (See Comments)  . Pollen Extract   . Sulfamethoxazole     Physical Exam: BP 118/68   Pulse 96   Temp 98.7 F (37.1 C) (Tympanic)   Resp 16   SpO2 96%    Physical Exam Vitals reviewed.  Constitutional:      Appearance: Normal appearance.  HENT:     Head: Normocephalic and atraumatic.     Right Ear: Tympanic membrane normal.     Left Ear: Tympanic membrane normal.     Nose:     Comments: Bilateral nares slightly erythematous with clear nasal drainage noted. Pharynx normal. Ears normal. Eyes normal.    Mouth/Throat:     Pharynx: Oropharynx is clear.  Eyes:     Conjunctiva/sclera: Conjunctivae  normal.  Cardiovascular:     Rate and Rhythm: Normal rate and regular rhythm.     Heart sounds: Normal heart sounds. No murmur heard.   Pulmonary:     Effort: Pulmonary effort is normal.     Breath sounds: Normal breath sounds.     Comments: Lungs clear to auscultation Musculoskeletal:        General: Normal range of motion.     Cervical back: Normal range of motion and neck supple.  Skin:    General: Skin is warm and dry.  Neurological:     Mental Status: She is alert and oriented to person, place, and time.  Psychiatric:        Mood and Affect: Mood normal.        Behavior: Behavior normal.        Thought Content: Thought content normal.        Judgment: Judgment normal.     Diagnostics: FVC 2.70, FEV1 2.11. Predicted FVC 3.10, predicted FEV1 2.38. Spirometry indicates normal ventilatory function.  Assessment and Plan: 1. Moderate persistent asthma without complication   2. Perinnial allergic rhinitis with a probable non-allergic component   3. Seasonal allergic conjunctivitis   4. Gastroesophageal reflux disease, unspecified whether esophagitis present     Meds ordered this encounter  Medications  . DISCONTD: montelukast (SINGULAIR) 10 MG tablet    Sig: TAKE 1 TABLET BY MOUTH  EVERYDAY AT BEDTIME    Dispense:  90 tablet    Refill:  1    Patient Instructions  Asthma Continue montelukast 10 mg once a day to prevent cough or wheeze Continue Alvesco 80-1 puff twice a day to prevent cough or wheeze Continue albuterol 2 puffs every 4 hours as needed for cough or wheeze You may use albuterol 2 puffs 5-15 minutes before activity to decrease cough or wheeze  Allergic rhinitis Continue Nasacort 1-2 sprays in each nostril once a day as needed for a stuffy nose Continue RyVent 6 mg twice a day as needed for a runny nose or itch Continue azelastine 1-2 sprays in each nostril twice a day as needed for a runny nose or sneezing Continue nasal saline rinses as needed for nasal  symptoms Continue allergen avoidance measures directed toward mold and dust mites as listed below  Allergic conjunctivitis Some over the counter eye drops include Pataday one drop in each eye once a day as needed for red, itchy eyes OR Zaditor one drop in each eye twice a day as needed for red itchy eyes.  Reflux Continue famotidine 20 mg twice a day for reflux Continue omeprazole 20 mg once a day as previously prescribed Continue dietary and lifestyle modifications as listed below  Call the clinic if this treatment plan is not working well for you, if you develop a fever, your symptoms worsen, or do not improve  Follow up in 5 months or sooner if needed.   Return in about 5 months (around 05/15/2020), or if symptoms worsen or fail to improve.    Thank you for the opportunity to care for this patient.  Please do not hesitate to contact me with questions.  Gareth Morgan, FNP [facility]  ________________________________________________  I have provided oversight concerning [name]'s evaluation and treatment of this patient's health issues addressed during today's encounter.  I agree with the assessment and therapeutic plan as outlined in the note.   Signed,   R Edgar Frisk, MD

## 2019-12-15 NOTE — Patient Instructions (Addendum)
Asthma Continue montelukast 10 mg once a day to prevent cough or wheeze Continue Alvesco 80-1 puff twice a day to prevent cough or wheeze Continue albuterol 2 puffs every 4 hours as needed for cough or wheeze You may use albuterol 2 puffs 5-15 minutes before activity to decrease cough or wheeze  Allergic rhinitis Continue Nasacort 1-2 sprays in each nostril once a day as needed for a stuffy nose Continue RyVent 6 mg twice a day as needed for a runny nose or itch Continue azelastine 1-2 sprays in each nostril twice a day as needed for a runny nose or sneezing Continue nasal saline rinses as needed for nasal symptoms Continue allergen avoidance measures directed toward mold and dust mites as listed below  Allergic conjunctivitis Some over the counter eye drops include Pataday one drop in each eye once a day as needed for red, itchy eyes OR Zaditor one drop in each eye twice a day as needed for red itchy eyes.  Reflux Continue famotidine 20 mg twice a day for reflux Continue omeprazole 20 mg once a day as previously prescribed Continue dietary and lifestyle modifications as listed below  Call the clinic if this treatment plan is not working well for you, if you develop a fever, your symptoms worsen, or do not improve  Follow up in 5 months or sooner if needed.  Lifestyle Changes for Controlling GERD When you have GERD, stomach acid feels as if it's backing up toward your mouth. Whether or not you take medication to control your GERD, your symptoms can often be improved with lifestyle changes.   Raise Your Head  Reflux is more likely to strike when you're lying down flat, because stomach fluid can  flow backward more easily. Raising the head of your bed 4-6 inches can help. To do this:  Slide blocks or books under the legs at the head of your bed. Or, place a wedge under  the mattress. Many foam stores can make a suitable wedge for you. The wedge  should run from your waist to the  top of your head.  Don't just prop your head on several pillows. This increases pressure on your  stomach. It can make GERD worse.  Watch Your Eating Habits Certain foods may increase the acid in your stomach or relax the lower esophageal sphincter, making GERD more likely. It's best to avoid the following:  Coffee, tea, and carbonated drinks (with and without caffeine)  Fatty, fried, or spicy food  Mint, chocolate, onions, and tomatoes  Any other foods that seem to irritate your stomach or cause you pain  Relieve the Pressure  Eat smaller meals, even if you have to eat more often.  Don't lie down right after you eat. Wait a few hours for your stomach to empty.  Avoid tight belts and tight-fitting clothes.  Lose excess weight.  Tobacco and Alcohol  Avoid smoking tobacco and drinking alcohol. They can make GERD symptoms worse.  Control of Mold Allergen Mold and fungi can grow on a variety of surfaces provided certain temperature and moisture conditions exist.  Outdoor molds grow on plants, decaying vegetation and soil.  The major outdoor mold, Alternaria and Cladosporium, are found in very high numbers during hot and dry conditions.  Generally, a late Summer - Fall peak is seen for common outdoor fungal spores.  Rain will temporarily lower outdoor mold spore count, but counts rise rapidly when the rainy period ends.  The most important indoor molds are Aspergillus and  Penicillium.  Dark, humid and poorly ventilated basements are ideal sites for mold growth.  The next most common sites of mold growth are the bathroom and the kitchen.  Outdoor [employer] 1. Use air conditioning and keep windows closed 2. Avoid exposure to decaying vegetation. 3. Avoid leaf raking. 4. Avoid grain handling. 5. Consider wearing a face mask if working in moldy areas.  Indoor Mold Control 1. Maintain humidity below 50%. 2. Clean washable surfaces with 5% bleach solution. 3. Remove sources e.g.  Contaminated carpets.   Control of Dust Mite Allergen Dust mites play a major role in allergic asthma and rhinitis. They occur in environments with high humidity wherever human skin is found. Dust mites absorb humidity from the atmosphere (ie, they do not drink) and feed on organic matter (including shed human and animal skin). Dust mites are a microscopic type of insect that you cannot see with the naked eye. High levels of dust mites have been detected from mattresses, pillows, carpets, upholstered furniture, bed covers, clothes, soft toys and any woven material. The principal allergen of the dust mite is found in its feces. A gram of dust may contain 1,000 mites and 250,000 fecal particles. Mite antigen is easily measured in the air during house cleaning activities. Dust mites do not bite and do not cause harm to humans, other than by triggering allergies/asthma.  Ways to decrease your exposure to dust mites in your home:  1. Encase mattresses, box springs and pillows with a mite-impermeable barrier or cover  2. Wash sheets, blankets and drapes weekly in hot water (130 F) with detergent and dry them in a dryer on the hot setting.  3. Have the room cleaned frequently with a vacuum cleaner and a damp dust-mop. For carpeting or rugs, vacuuming with a vacuum cleaner equipped with a high-efficiency particulate air (HEPA) filter. The dust mite allergic individual should not be in a room which is being cleaned and should wait 1 hour after cleaning before going into the room.  4. Do not sleep on upholstered furniture (eg, couches).  5. If possible removing carpeting, upholstered furniture and drapery from the home is ideal. Horizontal blinds should be eliminated in the rooms where the person spends the most time (bedroom, study, television room). Washable vinyl, roller-type shades are optimal.  6. Remove all non-washable stuffed toys from the bedroom. Wash stuffed toys weekly like sheets and blankets  above.  7. Reduce indoor humidity to less than 50%. Inexpensive humidity monitors can be purchased at most hardware stores. Do not use a humidifier as can make the problem worse and are not recommended.

## 2019-12-16 ENCOUNTER — Other Ambulatory Visit: Payer: Self-pay

## 2019-12-16 ENCOUNTER — Ambulatory Visit (INDEPENDENT_AMBULATORY_CARE_PROVIDER_SITE_OTHER): Payer: Medicare Other | Admitting: Family Medicine

## 2019-12-16 ENCOUNTER — Encounter: Payer: Self-pay | Admitting: Family Medicine

## 2019-12-16 VITALS — BP 118/68 | HR 96 | Temp 98.7°F | Resp 16

## 2019-12-16 DIAGNOSIS — J4540 Moderate persistent asthma, uncomplicated: Secondary | ICD-10-CM

## 2019-12-16 DIAGNOSIS — H1010 Acute atopic conjunctivitis, unspecified eye: Secondary | ICD-10-CM | POA: Diagnosis not present

## 2019-12-16 DIAGNOSIS — J3089 Other allergic rhinitis: Secondary | ICD-10-CM | POA: Diagnosis not present

## 2019-12-16 DIAGNOSIS — K219 Gastro-esophageal reflux disease without esophagitis: Secondary | ICD-10-CM

## 2019-12-16 MED ORDER — MONTELUKAST SODIUM 10 MG PO TABS
ORAL_TABLET | ORAL | 1 refills | Status: DC
Start: 2019-12-16 — End: 2019-12-16

## 2019-12-17 ENCOUNTER — Other Ambulatory Visit: Payer: Self-pay | Admitting: Family Medicine

## 2020-02-08 ENCOUNTER — Telehealth: Payer: Self-pay | Admitting: Family Medicine

## 2020-02-08 NOTE — Telephone Encounter (Signed)
Patient states her montelulast was sent to the wrong location  Was sent to [facility] needs to be resent to [facility], [address], [place], [place] [address] please advise

## 2020-02-16 ENCOUNTER — Encounter: Payer: Self-pay | Admitting: Gastroenterology

## 2020-04-25 ENCOUNTER — Encounter: Payer: Self-pay | Admitting: Family Medicine

## 2020-04-25 ENCOUNTER — Other Ambulatory Visit: Payer: Self-pay

## 2020-04-25 ENCOUNTER — Ambulatory Visit: Payer: Medicare Other | Admitting: Family Medicine

## 2020-04-25 VITALS — BP 124/66 | HR 100 | Temp 97.9°F | Resp 16 | Ht 62.0 in | Wt 174.4 lb

## 2020-04-25 DIAGNOSIS — J3089 Other allergic rhinitis: Secondary | ICD-10-CM

## 2020-04-25 DIAGNOSIS — H1010 Acute atopic conjunctivitis, unspecified eye: Secondary | ICD-10-CM

## 2020-04-25 DIAGNOSIS — K219 Gastro-esophageal reflux disease without esophagitis: Secondary | ICD-10-CM

## 2020-04-25 DIAGNOSIS — H1013 Acute atopic conjunctivitis, bilateral: Secondary | ICD-10-CM | POA: Diagnosis not present

## 2020-04-25 DIAGNOSIS — J4540 Moderate persistent asthma, uncomplicated: Secondary | ICD-10-CM

## 2020-04-25 NOTE — Progress Notes (Signed)
[address] [place] [address] Dept: [phone]  FOLLOW UP NOTE  Patient ID: Heidi Walls, female    DOB: [birth year]  Age: 65 y.o. MRN: [id number] Date of Office Visit: 04/25/2020  Assessment  Chief Complaint: Asthma and Cough  HPI Heidi Walls is a 65 year old female who presents to the clinic for follow-up visit.  She was last seen in this clinic on 12/16/2019 for evaluation of asthma, allergic rhinitis, allergic conjunctivitis, reflux, and osteopenia for which she takes Fosamax.  At today's visit she reports her asthma has been well controlled with no shortness of breath or wheeze with activity or rest.  She does report an underlying cough that is usually present producing thin clear mucus.  She continues montelukast 10 mg a day and has used her Alvesco 80-1 time and albuterol 2 times since her last visit to this clinic. Allergic rhinitis is reported as moderately well controlled with frequent clear rhinorrhea for which she continues Nasacort and RyVent as well as frequent nasal saline rinses. Reflux is reported as well controlled with no symptoms including heartburn or vomiting with lifestyle and dietary modifications as well as famotidine and esomeprazole. Allergic rhinitis is reported as well controlled with Pataday eye drops as needed. Her current medications are listed in the chart.   Drug Allergies:  Allergies  Allergen Reactions  . Codeine Shortness Of Breath    Pt reports this was 30 years ago.   . Nsaids   . Lisinopril Cough  . Bee Pollen Other (See Comments)  . Pollen Extract   . Sulfamethoxazole     Physical Exam: BP 124/66 (BP Location: Right Arm, Patient Position: Sitting, Cuff Size: Normal)   Pulse 100   Temp 97.9 F (36.6 C) (Tympanic)   Resp 16   Ht 5\' 2"  (1.575 m)   Wt 174 lb 6.4 oz (79.1 kg)   SpO2 99%   BMI 31.90 kg/m    Physical Exam Vitals reviewed.  Constitutional:      Appearance: Normal appearance.  HENT:     Head:  Normocephalic and atraumatic.     Right Ear: Tympanic membrane normal.     Left Ear: Tympanic membrane normal.     Nose:     Comments: Bilateral nares slightly erythematous with clear nasal drainage noted. Pharynx slightly erythematous with no exudate noted. Ears normal. Eyes normal. Eyes:     Conjunctiva/sclera: Conjunctivae normal.  Cardiovascular:     Rate and Rhythm: Normal rate and regular rhythm.     Heart sounds: Normal heart sounds. No murmur heard.   Pulmonary:     Effort: Pulmonary effort is normal.     Breath sounds: Normal breath sounds.     Comments: Lungs clear to auscultation Musculoskeletal:        General: Normal range of motion.     Cervical back: Normal range of motion and neck supple.  Skin:    General: Skin is warm and dry.  Neurological:     Mental Status: She is alert and oriented to person, place, and time.  Psychiatric:        Mood and Affect: Mood normal.        Behavior: Behavior normal.        Thought Content: Thought content normal.        Judgment: Judgment normal.     Diagnostics: FVC 2.35, FEV1 1.69. Predicted FVC 2.82, predicted FEV1 2.22. Spirometry indicates normal ventilatory function.   Assessment and Plan: 1. Moderate persistent  asthma without complication   2. Gastroesophageal reflux disease, unspecified whether esophagitis present   3. Perinnial allergic rhinitis with a probable non-allergic component   4. Seasonal allergic conjunctivitis     Patient Instructions  Asthma Continue albuterol 2 puffs every 4 hours as needed for cough or wheeze You may use albuterol 2 puffs 5-15 minutes before activity to decrease cough or wheeze For asthma flare, begin Alvesco 80-2 puffs twice a day with a spacer for 2 weeks or until cough and wheeze free  Allergic rhinitis Continue Nasacort 1-2 sprays in each nostril once a day as needed for a stuffy nose Begin Xyzal 5 mg once a day as needed for a runny nose or itch. This will replace RyVent.  Remember to rotate to a different antihistamine about every 3 months. Some examples of over the counter antihistamines include Zyrtec (cetirizine), Xyzal (levocetirizine), Allegra (fexofenadine), and Claritin (loratidine).  Continue azelastine 1-2 sprays in each nostril twice a day as needed for a runny nose or sneezing Continue nasal saline rinses as needed for nasal symptoms Continue allergen avoidance measures directed toward mold and dust mites as listed below  Allergic conjunctivitis Some over the counter eye drops include Pataday one drop in each eye once a day as needed for red, itchy eyes OR Zaditor one drop in each eye twice a day as needed for red itchy eyes.  Reflux Continue famotidine 20 mg twice a day for reflux Continue either omeprazole 20 mg or esomeprazole once a day as previously prescribed Continue dietary and lifestyle modifications as listed below  Call the clinic if this treatment plan is not working well for you, if you develop a fever, your symptoms worsen, or do not improve  Follow up in 6 months or sooner if needed.   Return in about 6 months (around 10/25/2020), or if symptoms worsen or fail to improve.    Thank you for the opportunity to care for this patient.  Please do not hesitate to contact me with questions.  Gareth Morgan, FNP [facility] [place]

## 2020-04-25 NOTE — Patient Instructions (Addendum)
Asthma Continue albuterol 2 puffs every 4 hours as needed for cough or wheeze You may use albuterol 2 puffs 5-15 minutes before activity to decrease cough or wheeze For asthma flare, begin Alvesco 80-2 puffs twice a day with a spacer for 2 weeks or until cough and wheeze free  Allergic rhinitis Continue Nasacort 1-2 sprays in each nostril once a day as needed for a stuffy nose Begin Xyzal 5 mg once a day as needed for a runny nose or itch. This will replace RyVent. Remember to rotate to a different antihistamine about every 3 months. Some examples of over the counter antihistamines include Zyrtec (cetirizine), Xyzal (levocetirizine), Allegra (fexofenadine), and Claritin (loratidine).  Continue azelastine 1-2 sprays in each nostril twice a day as needed for a runny nose or sneezing Continue nasal saline rinses as needed for nasal symptoms Continue allergen avoidance measures directed toward mold and dust mites as listed below  Allergic conjunctivitis Some over the counter eye drops include Pataday one drop in each eye once a day as needed for red, itchy eyes OR Zaditor one drop in each eye twice a day as needed for red itchy eyes.  Reflux Continue famotidine 20 mg twice a day for reflux Continue either omeprazole 20 mg or esomeprazole once a day as previously prescribed Continue dietary and lifestyle modifications as listed below  Call the clinic if this treatment plan is not working well for you, if you develop a fever, your symptoms worsen, or do not improve  Follow up in 6 months or sooner if needed.  Lifestyle Changes for Controlling GERD When you have GERD, stomach acid feels as if it's backing up toward your mouth. Whether or not you take medication to control your GERD, your symptoms can often be improved with lifestyle changes.   Raise Your Head  Reflux is more likely to strike when you're lying down flat, because stomach fluid can  flow backward more easily. Raising the head  of your bed 4-6 inches can help. To do this:  Slide blocks or books under the legs at the head of your bed. Or, place a wedge under  the mattress. Many foam stores can make a suitable wedge for you. The wedge  should run from your waist to the top of your head.  Don't just prop your head on several pillows. This increases pressure on your  stomach. It can make GERD worse.  Watch Your Eating Habits Certain foods may increase the acid in your stomach or relax the lower esophageal sphincter, making GERD more likely. It's best to avoid the following:  Coffee, tea, and carbonated drinks (with and without caffeine)  Fatty, fried, or spicy food  Mint, chocolate, onions, and tomatoes  Any other foods that seem to irritate your stomach or cause you pain  Relieve the Pressure  Eat smaller meals, even if you have to eat more often.  Don't lie down right after you eat. Wait a few hours for your stomach to empty.  Avoid tight belts and tight-fitting clothes.  Lose excess weight.  Tobacco and Alcohol  Avoid smoking tobacco and drinking alcohol. They can make GERD symptoms worse.  Control of Mold Allergen Mold and fungi can grow on a variety of surfaces provided certain temperature and moisture conditions exist.  Outdoor molds grow on plants, decaying vegetation and soil.  The major outdoor mold, Alternaria and Cladosporium, are found in very high numbers during hot and dry conditions.  Generally, a late Summer - Fall peak  is seen for common outdoor fungal spores.  Rain will temporarily lower outdoor mold spore count, but counts rise rapidly when the rainy period ends.  The most important indoor molds are Aspergillus and Penicillium.  Dark, humid and poorly ventilated basements are ideal sites for mold growth.  The next most common sites of mold growth are the bathroom and the kitchen.  Outdoor [employer] 1. Use air conditioning and keep windows closed 2. Avoid exposure to decaying  vegetation. 3. Avoid leaf raking. 4. Avoid grain handling. 5. Consider wearing a face mask if working in moldy areas.  Indoor Mold Control 1. Maintain humidity below 50%. 2. Clean washable surfaces with 5% bleach solution. 3. Remove sources e.g. Contaminated carpets.   Control of Dust Mite Allergen Dust mites play a major role in allergic asthma and rhinitis. They occur in environments with high humidity wherever human skin is found. Dust mites absorb humidity from the atmosphere (ie, they do not drink) and feed on organic matter (including shed human and animal skin). Dust mites are a microscopic type of insect that you cannot see with the naked eye. High levels of dust mites have been detected from mattresses, pillows, carpets, upholstered furniture, bed covers, clothes, soft toys and any woven material. The principal allergen of the dust mite is found in its feces. A gram of dust may contain 1,000 mites and 250,000 fecal particles. Mite antigen is easily measured in the air during house cleaning activities. Dust mites do not bite and do not cause harm to humans, other than by triggering allergies/asthma.  Ways to decrease your exposure to dust mites in your home:  1. Encase mattresses, box springs and pillows with a mite-impermeable barrier or cover  2. Wash sheets, blankets and drapes weekly in hot water (130 F) with detergent and dry them in a dryer on the hot setting.  3. Have the room cleaned frequently with a vacuum cleaner and a damp dust-mop. For carpeting or rugs, vacuuming with a vacuum cleaner equipped with a high-efficiency particulate air (HEPA) filter. The dust mite allergic individual should not be in a room which is being cleaned and should wait 1 hour after cleaning before going into the room.  4. Do not sleep on upholstered furniture (eg, couches).  5. If possible removing carpeting, upholstered furniture and drapery from the home is ideal. Horizontal blinds should be  eliminated in the rooms where the person spends the most time (bedroom, study, television room). Washable vinyl, roller-type shades are optimal.  6. Remove all non-washable stuffed toys from the bedroom. Wash stuffed toys weekly like sheets and blankets above.  7. Reduce indoor humidity to less than 50%. Inexpensive humidity monitors can be purchased at most hardware stores. Do not use a humidifier as can make the problem worse and are not recommended.

## 2020-05-09 ENCOUNTER — Other Ambulatory Visit: Payer: Self-pay | Admitting: Family Medicine

## 2020-05-09 NOTE — Telephone Encounter (Signed)
Left a voicemail asking for patient to return call to discuss need for medication before requesting [facility] to ok refill.

## 2020-05-23 ENCOUNTER — Ambulatory Visit (INDEPENDENT_AMBULATORY_CARE_PROVIDER_SITE_OTHER): Payer: Medicare Other

## 2020-05-23 ENCOUNTER — Other Ambulatory Visit: Payer: Self-pay

## 2020-05-23 ENCOUNTER — Ambulatory Visit: Payer: Medicare Other | Admitting: Podiatry

## 2020-05-23 ENCOUNTER — Other Ambulatory Visit: Payer: Self-pay | Admitting: Podiatry

## 2020-05-23 DIAGNOSIS — G5781 Other specified mononeuropathies of right lower limb: Secondary | ICD-10-CM

## 2020-05-23 DIAGNOSIS — M79671 Pain in right foot: Secondary | ICD-10-CM

## 2020-05-23 MED ORDER — BETAMETHASONE SOD PHOS & ACET 6 (3-3) MG/ML IJ SUSP
3.0000 mg | Freq: Once | INTRAMUSCULAR | Status: AC
  Administered 2020-05-23: 3 mg

## 2020-05-23 NOTE — Progress Notes (Signed)
  Subjective:  Patient ID: Heidi Walls, female    DOB: 1955-05-12,  MRN: [id number]  Chief Complaint  Patient presents with  . Pain    Pain at Rt 2nd met x 1 mo; no redness, but with swelling -at times 10/10 than goes down to 2-3/10 - worsw with walking -w/ numbness at times Tx: voltaren     65 y.o. female presents with the above complaint. History confirmed with patient.   Objective:  Physical Exam: warm, good capillary refill, no trophic changes or ulcerative lesions, normal DP and PT pulses and normal sensory exam. Right Foot: POP 2nd interspace without mulder's click. Palpable ostoephytes left 2nd, 4th proximal phalanx without POP. Hallux valgus without POP.   No images are attached to the encounter.  Radiographs: X-ray of the right foot: no acute fractures. Hallux valgus with mild joint degeneration. Decreased 2nd/3rd IMA. Proximal 2nd met healed fracture Assessment:   1. Interdigital neuroma of right foot    Plan:  Patient was evaluated and treated and all questions answered.  Interdigital Neuroma -XR reviewed with patient -Injection delivered to the affected interspaces  Procedure: Neuroma Injection Location: Right 2nd interspace Skin Prep: Alcohol. Injectate: 0.5 cc 0.5% marcaine plain, 0.5 cc betamethasone acetate-betamethasone sodium phosphate Disposition: Patient tolerated procedure well. Injection site dressed with a band-aid.  No follow-ups on file.

## 2020-05-26 ENCOUNTER — Other Ambulatory Visit: Payer: Self-pay | Admitting: Podiatry

## 2020-05-26 DIAGNOSIS — G5781 Other specified mononeuropathies of right lower limb: Secondary | ICD-10-CM

## 2020-06-13 ENCOUNTER — Ambulatory Visit: Payer: Medicare Other | Admitting: Podiatry

## 2020-06-23 ENCOUNTER — Ambulatory Visit: Payer: Medicare Other | Admitting: Podiatry

## 2020-07-04 ENCOUNTER — Ambulatory Visit (INDEPENDENT_AMBULATORY_CARE_PROVIDER_SITE_OTHER): Payer: Medicare Other | Admitting: Podiatry

## 2020-07-04 ENCOUNTER — Other Ambulatory Visit: Payer: Self-pay | Admitting: *Deleted

## 2020-07-04 ENCOUNTER — Other Ambulatory Visit: Payer: Self-pay

## 2020-07-04 ENCOUNTER — Encounter: Payer: Self-pay | Admitting: Podiatry

## 2020-07-04 DIAGNOSIS — G5781 Other specified mononeuropathies of right lower limb: Secondary | ICD-10-CM | POA: Diagnosis not present

## 2020-07-04 DIAGNOSIS — G5762 Lesion of plantar nerve, left lower limb: Secondary | ICD-10-CM

## 2020-07-04 DIAGNOSIS — G5782 Other specified mononeuropathies of left lower limb: Secondary | ICD-10-CM

## 2020-07-04 NOTE — Progress Notes (Signed)
  Subjective:  Patient ID: Heidi Walls, female    DOB: August 03, 1955,  MRN: [id number]  Chief Complaint  Patient presents with   Neuroma    I am better on the right foot and the injection did help alot    65 y.o. female presents with the above complaint. History confirmed with patient. Doing much better denies pain in either foot states they have not been bothering her.  Objective:  Physical Exam: warm, good capillary refill, no trophic changes or ulcerative lesions, normal DP and PT pulses and normal sensory exam. Right Foot: No POP 2nd interspace, no mulder's click. Palpable ostoephytes left 2nd, 4th proximal phalanx without POP. Hallux valgus without POP.    Assessment:   1. Interdigital neuroma of right foot   2. 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  Interdigital Neuroma -No repeat injection indicated today. -Appear to be resolved. F/u should issues persist and we can consider repeat injection.  Return if symptoms worsen or fail to improve.

## 2020-07-12 ENCOUNTER — Telehealth: Payer: Self-pay | Admitting: Family Medicine

## 2020-07-12 MED ORDER — ALBUTEROL SULFATE HFA 108 (90 BASE) MCG/ACT IN AERS: 2.0000 | INHALATION_SPRAY | Freq: Four times a day (QID) | RESPIRATORY_TRACT | 2 refills | Status: DC | PRN

## 2020-07-12 MED ORDER — CICLESONIDE 80 MCG/ACT IN AERS: 1.0000 | INHALATION_SPRAY | Freq: Two times a day (BID) | RESPIRATORY_TRACT | 3 refills | Status: DC

## 2020-07-12 MED ORDER — MONTELUKAST SODIUM 10 MG PO TABS: ORAL_TABLET | ORAL | 5 refills | Status: DC

## 2020-07-12 NOTE — Telephone Encounter (Signed)
Spoke with pt, refills for the singular and albuterol sent to [facility] and the alvesco sample with be picked up Thursday and rx sent to [facility].

## 2020-07-12 NOTE — Telephone Encounter (Signed)
Pt states she will be leaving town for 2 mnths and needs samples of Alvesco and refills for montelukast and [employer]. She would like refills sent to [facility].

## 2020-07-12 NOTE — Telephone Encounter (Signed)
Please refill montelukast and albuterol. If we have any samples of Alvesco 80 she may have one. Thank you. We can also call one into her pharmacy if we do not have samples. Please use [facility]. She can get 2 inhalers for $50. Thank you

## 2020-08-02 ENCOUNTER — Other Ambulatory Visit: Payer: Self-pay | Admitting: Family Medicine

## 2020-09-14 ENCOUNTER — Telehealth: Payer: Self-pay | Admitting: Family Medicine

## 2020-09-14 NOTE — Telephone Encounter (Signed)
Patient has had an allergy attack asking for advice please advise

## 2020-09-15 ENCOUNTER — Other Ambulatory Visit: Payer: Self-pay | Admitting: Podiatry

## 2020-09-15 ENCOUNTER — Other Ambulatory Visit: Payer: Self-pay

## 2020-09-15 ENCOUNTER — Ambulatory Visit (INDEPENDENT_AMBULATORY_CARE_PROVIDER_SITE_OTHER): Payer: Medicare Other

## 2020-09-15 ENCOUNTER — Ambulatory Visit: Payer: Medicare Other | Admitting: Podiatry

## 2020-09-15 ENCOUNTER — Encounter: Payer: Self-pay | Admitting: Podiatry

## 2020-09-15 DIAGNOSIS — S99919A Unspecified injury of unspecified ankle, initial encounter: Secondary | ICD-10-CM

## 2020-09-15 DIAGNOSIS — S99921A Unspecified injury of right foot, initial encounter: Secondary | ICD-10-CM

## 2020-09-15 DIAGNOSIS — S92351G Displaced fracture of fifth metatarsal bone, right foot, subsequent encounter for fracture with delayed healing: Secondary | ICD-10-CM

## 2020-09-15 DIAGNOSIS — S8261XA Displaced fracture of lateral malleolus of right fibula, initial encounter for closed fracture: Secondary | ICD-10-CM

## 2020-09-15 MED ORDER — CARBINOXAMINE MALEATE 4 MG PO TABS: 4.0000 mg | ORAL_TABLET | Freq: Four times a day (QID) | ORAL | 0 refills | Status: DC | PRN

## 2020-09-15 NOTE — Telephone Encounter (Signed)
Please start carbinoxamine 4 mg up to 4 times a day as needed. This will replace Xyzal. Thank you. Please have her call the clinic with any worsening symptoms or questions.

## 2020-09-15 NOTE — Telephone Encounter (Signed)
Called and informed patient that we called in carbinoxamine 4 mg to [facility] and to stop Xyzal. Verbalized understanding and will call back with any worsening sxs or questions.

## 2020-09-15 NOTE — Telephone Encounter (Signed)
Called patient, she has been out of town for 31/2 weeks in [place]. When she returned to [facility] on Friday she's been experiencing allergies sxs. She doesn't know if it's because of the altitude or allergies. Her sxs are, coughing, itchy eyes, skin and nose. Not a lot of wheezing. Negative COVID test. Has been feeling better the last couple of days. Would like to go back on carbinoxamine, states it works better than Xyzal. She is currently taking montelukast, albuterol, and hasn't used her Alvesco in a while. Please advise.

## 2020-09-16 ENCOUNTER — Telehealth: Payer: Self-pay | Admitting: Urology

## 2020-09-16 NOTE — Telephone Encounter (Signed)
DOS - 09/21/20  ORIF 5TH METATARSAL RIGHT --- [id number]  [employer] EFFECTIVE DATE - 09/08/20   PLAN DEDUCTIBLE - $0.00 OUT OF POCKET - $4,500.00 W/ $3,358.47 REMAINING COINSURANCE - 0% COPAY - $325.00   SPOKE WITH GLORIA WITH [employer] AND SHE STATED FOR CPT CODE 95188 NO PRIOR AUTH IS REQUIRED.  REF # [id number]

## 2020-09-21 ENCOUNTER — Other Ambulatory Visit: Payer: Self-pay | Admitting: Podiatry

## 2020-09-21 ENCOUNTER — Encounter: Payer: Self-pay | Admitting: Podiatry

## 2020-09-21 DIAGNOSIS — S99921A Unspecified injury of right foot, initial encounter: Secondary | ICD-10-CM | POA: Diagnosis not present

## 2020-09-21 DIAGNOSIS — S92351A Displaced fracture of fifth metatarsal bone, right foot, initial encounter for closed fracture: Secondary | ICD-10-CM

## 2020-09-21 MED ORDER — CEPHALEXIN 500 MG PO CAPS: ORAL_CAPSULE | ORAL | 0 refills | Status: DC

## 2020-09-21 MED ORDER — OXYCODONE-ACETAMINOPHEN 5-325 MG PO TABS: 1.0000 | ORAL_TABLET | ORAL | 0 refills | Status: DC | PRN

## 2020-09-21 MED ORDER — ONDANSETRON HCL 4 MG PO TABS: 4.0000 mg | ORAL_TABLET | Freq: Three times a day (TID) | ORAL | 0 refills | Status: DC | PRN

## 2020-09-21 MED ORDER — RIVAROXABAN 10 MG PO TABS: 10.0000 mg | ORAL_TABLET | Freq: Every day | ORAL | 0 refills | Status: DC

## 2020-09-22 NOTE — Progress Notes (Signed)
  Subjective:  Patient ID: Heidi Walls, female    DOB: 1955-08-26,  MRN: [id number]  Chief Complaint  Patient presents with   Foot Pain    I stepped in a hole July 18th on vacation  right foot    65 y.o. female presents with the above complaint. History confirmed with patient. States injury to right ankle 07/25/20. Was seen at urgent care and told she needed surgery. Was away on vacation and could not make it back earlier for care.  Objective:  Physical Exam: warm, good capillary refill, no trophic changes or ulcerative lesions, normal DP and PT pulses, and normal sensory exam.  Right Foot: POP right 5th metatarsal base area. No bruising. Local edema. POP fibular tip right foot.   No images are attached to the encounter.  Radiographs: X-ray of the right foot: avulsion fracture right tip of the fibula without displacement. Fracture of 5th met base with proximal and dorsal displacement. No other acute fractures Assessment:   1. Fracture of base of fifth metatarsal bone with delayed healing, right   2. Foot injury, right, initial encounter   3. Ankle injury, initial encounter   4. Closed avulsion fracture of lateral malleolus of right fibula, initial encounter    Plan:  Patient was evaluated and treated and all questions answered.  Right displaced 5th metatarsal base fracture -XR Reviewed with patient -Would benefit from operative management for this injury. -Patient has failed all conservative therapy and wishes to proceed with surgical intervention. All risks, benefits, and alternatives discussed with patient. No guarantees given. Consent reviewed and signed by patient. -Planned procedures: Right 5th metatarsal ORIF -ASA 3 - Patient with moderate systemic disease with functional limitations  -Post-op anticoagulation: lovenox or xarelto   Right fibular avulsion fracture -XR reviewed -Would benefit from non-operative care for this injury. -Protect fracture in CAM boot  No  follow-ups on file.

## 2020-09-26 ENCOUNTER — Other Ambulatory Visit: Payer: Self-pay

## 2020-09-26 ENCOUNTER — Ambulatory Visit (INDEPENDENT_AMBULATORY_CARE_PROVIDER_SITE_OTHER): Payer: Medicare Other

## 2020-09-26 ENCOUNTER — Ambulatory Visit (INDEPENDENT_AMBULATORY_CARE_PROVIDER_SITE_OTHER): Payer: Medicare Other | Admitting: Podiatry

## 2020-09-26 DIAGNOSIS — Z9889 Other specified postprocedural states: Secondary | ICD-10-CM

## 2020-09-26 MED ORDER — OXYCODONE-ACETAMINOPHEN 5-325 MG PO TABS: 1.0000 | ORAL_TABLET | ORAL | 0 refills | Status: DC | PRN

## 2020-09-26 NOTE — Progress Notes (Signed)
  Subjective:  Patient ID: Heidi Walls, female    DOB: 25-Oct-1955,  MRN: [id number]  Chief Complaint  Patient presents with   Routine Post Op    POV #1 -pt deneis N/V/F/Ch -pt states," Thursday and Friday pain was terrible but today I haven't had any pain so far; 5/10 average pain - dressing intact tx: boot, icing, elevatoin and scooter and tylenol     DOS: 09/21/20 Procedure: ORIF 5th metatarsal right  65 y.o. female presents with the above complaint. History confirmed with patient.   Objective:  Physical Exam: tenderness at the surgical site, local edema noted, and calf supple, nontender. Incision: healing well, no significant drainage, no dehiscence, no significant erythema  No images are attached to the encounter.  Radiographs: X-ray of the right foot: consistent with post-op state, with good alignment and no evidence of hardware complication  Assessment:   1. S/P foot surgery, right    Plan:  Patient was evaluated and treated and all questions answered.  Post-operative State -XR reviewed with patient -Dressing applied consisting of povidone, sterile gauze, kerlix, and coban -NWB with knee scooter -Pain medication refilled -XRs needed at follow-up: 3 view Foot   No follow-ups on file.

## 2020-10-10 ENCOUNTER — Ambulatory Visit (INDEPENDENT_AMBULATORY_CARE_PROVIDER_SITE_OTHER): Payer: Medicare Other

## 2020-10-10 ENCOUNTER — Other Ambulatory Visit: Payer: Self-pay

## 2020-10-10 ENCOUNTER — Ambulatory Visit (INDEPENDENT_AMBULATORY_CARE_PROVIDER_SITE_OTHER): Payer: Medicare Other | Admitting: Podiatry

## 2020-10-10 DIAGNOSIS — M79671 Pain in right foot: Secondary | ICD-10-CM | POA: Diagnosis not present

## 2020-10-10 DIAGNOSIS — Z9889 Other specified postprocedural states: Secondary | ICD-10-CM

## 2020-10-10 NOTE — Progress Notes (Signed)
  Subjective:  Patient ID: Heidi Walls, female    DOB: 01-25-1955,  MRN: [id number]  Chief Complaint  Patient presents with   Routine Post Op    POV #2 -pt deneis N/V/F/Ch -dressing changed twice due to getting it wet, pt applied dry dressing - pt states," for 2 days foot was hurting really bad and that was it, not as bad now; 1/10." -Tx: boot, scooter, icing, and elevatoin and oxy as needed - w/ numbness and tingling at 1st met   DOS: 09/21/20 Procedure: ORIF 5th metatarsal right  65 y.o. female presents with the above complaint. History confirmed with patient.   Objective:  Physical Exam: tenderness at the surgical site, local edema noted, and calf supple, nontender. Incision: healing well, no significant drainage, no dehiscence, no significant erythema  No images are attached to the encounter.  Radiographs: X-ray of the right foot: consistent with post-op state, with acceptable alignment and no evidence of hardware complication  Assessment:   1. S/P foot surgery, right    Plan:  Patient was evaluated and treated and all questions answered.  Post-operative State -XR reviewed with patient -Sutures removed -Ok to start showering at this time. Advised they cannot soak. -Dressing applied consisting of sterile gauze, kerlix, and ACE bandage -NWB with knee scooter -XRs needed at follow-up: 3 view Foot   Return in about 2 weeks (around 10/24/2020) for Post-Op (No XRs).

## 2020-10-12 ENCOUNTER — Encounter: Payer: Self-pay | Admitting: Hematology and Oncology

## 2020-10-12 NOTE — Progress Notes (Signed)
Patient Care Team: Shanon Ace, MD as PCP - General (Family Medicine) Nicholas Lose, MD as Consulting Physician (Hematology and Oncology) Delice Bison, Charlestine Massed, NP as Nurse Practitioner (Hematology and Oncology) Irene Limbo, MD as Consulting Physician (Plastic Surgery) Excell Seltzer, MD (Inactive) as Consulting Physician (General Surgery)  DIAGNOSIS:    ICD-10-CM   1. Malignant neoplasm of upper-inner quadrant of left breast in female, estrogen receptor positive ([place])  C50.212    Z17.0       SUMMARY OF ONCOLOGIC HISTORY: Oncology History  Breast cancer of upper-inner quadrant of left female breast ([place])  05/26/2015 Initial Diagnosis   Screen detected left breast asymmetry (posterior 1.6 cm): Grade 2-3 IDC ER/PR positive HER-2 positive Ki-67 20% plus calcs (span 6.1 cm): High-grade DCIS with suspicious foci of invasion (4.2 cm apart)   06/24/2015 Surgery   Left simple mastectomy with reconstruction: IDC grade 3, 2.5 cm, with high-grade DCIS, ALH, LVI present, margins negative, 0/1 sentinel node negative, T2 N0 stage II a, ER 100%, PR 5%, HER-2 positive ratio 1.56 with average copy #8.25, Ki-67 20%   07/25/2015 - 11/03/2015 Chemotherapy   Adjuvant chemotherapy with TCH Perjeta 6 cycles followed by Herceptin maintenance for 1 year   11/28/2015 -  Anti-estrogen oral therapy   Adjuvant anastrozole 1 mg daily; switched to exemestane on 07/08/18, switched to letrozole     CHIEF COMPLIANT: Follow-up of left breast cancer on exemestane therapy   INTERVAL HISTORY: Heidi Walls is a 65 y.o. with above-mentioned history of left breast cancer treated with mastectomy, adjuvant chemotherapy, and is currently on letrozole therapy after she could not tolerate anastrozole. She presents to the clinic today for follow-up.   ALLERGIES:  is allergic to codeine, nsaids, lisinopril, bee pollen, pollen extract, and sulfamethoxazole.  MEDICATIONS:  Current Outpatient  Medications  Medication Sig Dispense Refill   acetaminophen (TYLENOL) 325 MG tablet Take 650 mg by mouth every 4 (four) hours as needed.      albuterol (VENTOLIN HFA) 108 (90 Base) MCG/ACT inhaler Inhale 2 puffs into the lungs every 6 (six) hours as needed for wheezing or shortness of breath. 8 g 2   alendronate (FOSAMAX) 70 MG tablet Take 70 mg by mouth once a week.     atorvastatin (LIPITOR) 10 MG tablet Take 1 tablet by mouth daily.     Biotin 5 MG CAPS Take by mouth.     bupivacaine (MARCAINE) 0.25 % injection Inject into the skin.     busPIRone (BUSPAR) 5 MG tablet Take 5 mg by mouth 2 (two) times daily.     Calcium Carb-Cholecalciferol (CALCIUM 600 + D PO) Take 1 tablet by mouth daily.     Carbinoxamine Maleate 4 MG TABS Take 1 tablet (4 mg total) by mouth 4 (four) times daily as needed. 120 tablet 0   cephALEXin (KEFLEX) 500 MG capsule Take 1 tablet by mouth tonight, then one tomorrow AM and one in PM 3 capsule 0   ciclesonide (ALVESCO) 80 MCG/ACT inhaler Inhale 1 puff into the lungs 2 (two) times daily. 1 each 3   DULoxetine (CYMBALTA) 60 MG capsule Take 1 capsule (60 mg total) by mouth daily. 90 capsule 3   esomeprazole (NEXIUM) 20 MG capsule Take 1 capsule (20 mg total) by mouth daily. 90 capsule 0   famotidine (PEPCID) 20 MG tablet Take 1 tablet (20 mg total) by mouth 2 (two) times daily. 60 tablet 5   ferrous sulfate 325 (65 FE) MG EC tablet Take by  mouth.     gabapentin (NEURONTIN) 800 MG tablet Take 1 tablet (800 mg total) by mouth 3 (three) times daily. (Patient taking differently: Take 800 mg by mouth 2 (two) times daily.) 90 tablet 5   hydrOXYzine (ATARAX/VISTARIL) 25 MG tablet Take 25 mg by mouth at bedtime.     loratadine (CLARITIN) 10 MG tablet Take by mouth.     montelukast (SINGULAIR) 10 MG tablet Take 10 mg by mouth at bedtime.     Multiple Vitamin (MULTIVITAMIN) tablet Take 1 tablet by mouth daily.     NASAL SALINE NA Place into the nose daily. Nasal saline wash every  morning      olmesartan-hydrochlorothiazide (BENICAR HCT) 20-12.5 MG tablet Take 1 tablet by mouth daily.     Omega-3 Fatty Acids (FISH OIL) 1200 MG CAPS Take 1,200 mg by mouth daily.      omeprazole (PRILOSEC) 20 MG capsule Take 1 capsule (20 mg total) by mouth daily. (Patient not taking: Reported on 04/25/2020) 90 capsule 3   ondansetron (ZOFRAN) 4 MG tablet Take 1 tablet (4 mg total) by mouth every 8 (eight) hours as needed for nausea or vomiting. 20 tablet 0   oxyCODONE-acetaminophen (PERCOCET) 5-325 MG tablet Take 1 tablet by mouth every 4 (four) hours as needed for severe pain. 20 tablet 0   Polyethyl Glycol-Propyl Glycol (SYSTANE OP) Apply to eye. 2-3 drops daily     Probiotic Product (PROBIOTIC DAILY PO) Take by mouth.     promethazine-dextromethorphan (PROMETHAZINE-DM) 6.25-15 MG/5ML syrup Take 5 mLs by mouth 3 (three) times daily as needed. (Patient not taking: Reported on 04/25/2020)     rivaroxaban (XARELTO) 10 MG TABS tablet Take 1 tablet (10 mg total) by mouth daily. 30 tablet 0   tiZANidine (ZANAFLEX) 4 MG tablet Take 4 mg by mouth 3 (three) times daily.     triamcinolone (NASACORT) 55 MCG/ACT AERO nasal inhaler Place 2 sprays into the nose daily. (Patient taking differently: Place 2 sprays into the nose as needed.) 1 Inhaler 5   valACYclovir (VALTREX) 1000 MG tablet Take 1,000 mg by mouth 2 (two) times daily.     vitamin B-12 (CYANOCOBALAMIN) 250 MCG tablet Take by mouth.     ZINC SULFATE PO Take by mouth.     No current facility-administered medications for this visit.    PHYSICAL EXAMINATION: ECOG PERFORMANCE STATUS: 1 - Symptomatic but completely ambulatory  Vitals:   10/13/20 1123  BP: (!) 152/86  Pulse: 100  Resp: 18  Temp: 97.9 F (36.6 C)  SpO2: 96%   Filed Weights   10/13/20 1123  Weight: 178 lb 11.2 oz (81.1 kg)    BREAST: No palpable masses or nodules in either right or left breasts. No palpable axillary supraclavicular or infraclavicular adenopathy no  breast tenderness or nipple discharge. (exam performed in the presence of a chaperone)  LABORATORY DATA:  I have reviewed the data as listed CMP Latest Ref Rng & Units 07/08/2018 01/13/2018 11/04/2017  Glucose 70 - 99 mg/dL 84 110(H) 105(H)  BUN 8 - 23 mg/dL 18 20 46(H)  Creatinine 0.44 - 1.00 mg/dL 1.24(H) 1.21(H) 1.57(H)  Sodium 135 - 145 mmol/L 139 136 134  Potassium 3.5 - 5.1 mmol/L 3.1(L) 4.4 5.2  Chloride 98 - 111 mmol/L 98 98 98  CO2 22 - 32 mmol/L _0 Calcium 8.9 - 10.3 mg/dL 9.9 10.4(H) 9.3  Total Protein 6.5 - 8.1 g/dL 7.9 7.0 6.7  Total Bilirubin 0.3 - 1.2 mg/dL 0.2(L) <0.2 <  0.2  Alkaline Phos 38 - 126 U/L 112 93 80  AST 15 - 41 U/L 19 18 51(H)  ALT 0 - 44 U/L 20 19 32    Lab Results  Component Value Date   WBC 7.7 10/23/2018   HGB 11.0 (L) 10/23/2018   HCT 34.1 10/23/2018   MCV 85 10/23/2018   PLT 369 10/23/2018   NEUTROABS 5.4 10/23/2018    ASSESSMENT & PLAN:  Breast cancer of upper-inner quadrant of left female breast ([place]) Left simple mastectomy with reconstruction 06/24/2015: IDC grade 3, 2.5 cm, with high-grade DCIS, ALH, LVI present, margins negative, 0/1 sentinel node negative, , ER 100%, PR 5%, HER-2 positive ratio 1.56 with average copy #8.25, Ki-67 20%   Pathologic stage:T2 N0 stage II a Treatment plan: 1. Adjuvant chemotherapy with TCHP X 6 completed 11/07/15 followed by Herceptin maintenance for 1 year which completed 08/07/2016 2. Followed by adjuvant hormonal therapy with anastrozole started 11/28/2015 ---------------------------------------------------------------------------------------------------------- Current treatment:  Echocardiogram 05/22/16 LVEF 60-65% Anastrozole started 11/28/15 switched to letrozole 07/08/2018   Letrozole toxicities:  1. Memory problems 2. Body aches and pains    Bone density test 04/12/2016: T score -2.2. Osteopenia.  On Fosamax  Right ankle fracture: Patient was on an 70 state RV trip and fell and broke her  ankle in a couple of places.  She finished the drug and then had surgery in September.  She is still wearing a boot. She was told that the bones were extremely soft.  I recommended switching her from Fosamax to Prolia. COVID infection Feb 2021   Breast cancer surveillance:  Right breast mammogram 07/19/20 Benign at [facility] breast density category C We will schedule Prolia injections every 6 months with labs.   Anxiety: On antianxiety medications    I will see her back in 1 year for follow-up.     No orders of the defined types were placed in this encounter.  The patient has a good understanding of the overall plan. she agrees with it. she will call with any problems that may develop before the next visit here.  Total time spent: 20 mins including face to face time and time spent for planning, charting and coordination of care  Rulon Eisenmenger, MD, MPH 10/13/2020  I, Thana Ates, am acting as scribe for Dr. Nicholas Lose.  I have reviewed the above documentation for accuracy and completeness, and I agree with the above.

## 2020-10-13 ENCOUNTER — Inpatient Hospital Stay: Payer: Medicare Other | Attending: Hematology and Oncology | Admitting: Hematology and Oncology

## 2020-10-13 ENCOUNTER — Telehealth: Payer: Self-pay | Admitting: Hematology and Oncology

## 2020-10-13 ENCOUNTER — Other Ambulatory Visit: Payer: Self-pay

## 2020-10-13 DIAGNOSIS — C50212 Malignant neoplasm of upper-inner quadrant of left female breast: Secondary | ICD-10-CM | POA: Insufficient documentation

## 2020-10-13 DIAGNOSIS — M8580 Other specified disorders of bone density and structure, unspecified site: Secondary | ICD-10-CM | POA: Diagnosis not present

## 2020-10-13 DIAGNOSIS — Z79811 Long term (current) use of aromatase inhibitors: Secondary | ICD-10-CM | POA: Insufficient documentation

## 2020-10-13 DIAGNOSIS — M791 Myalgia, unspecified site: Secondary | ICD-10-CM | POA: Insufficient documentation

## 2020-10-13 DIAGNOSIS — Z9012 Acquired absence of left breast and nipple: Secondary | ICD-10-CM | POA: Insufficient documentation

## 2020-10-13 DIAGNOSIS — Z170 Estrogen receptor positive status [ER+]: Secondary | ICD-10-CM

## 2020-10-13 DIAGNOSIS — R413 Other amnesia: Secondary | ICD-10-CM | POA: Diagnosis not present

## 2020-10-13 NOTE — Assessment & Plan Note (Signed)
Left simple mastectomy with reconstruction 06/24/2015: IDC grade 3, 2.5 cm, with high-grade DCIS, ALH, LVI present, margins negative, 0/1 sentinel node negative, , ER 100%, PR 5%, HER-2 positive ratio 1.56 with average copy #8.25, Ki-67 20%  Pathologic stage:T2 N0 stage II a Treatment plan: 1. Adjuvant chemotherapy with TCHP X 6 completed 10/30/17followed by Herceptin maintenance for 1 year which completed 08/07/2016 2. Followed by adjuvant hormonal therapy with anastrozole started 11/28/2015 ---------------------------------------------------------------------------------------------------------- Current treatment:  Echocardiogram 05/22/16 LVEF 60-65% Anastrozole started 11/20/17switched to letrozole6/30/2020  Letrozoletoxicities: 1. Memory problems 2. Body aches and pains  Bone density test 04/12/2016: T score -2.2. Osteopenia. Off Fosamax. Will resume Oct 2022. After another bone density. Takes calcium and vitamin D. Left foot fractures from walking 5K and ankle fracture at another time (wears braces)  COVID infection Feb 2021  Breast cancer surveillance:  Right breast mammogram 07/19/20 Benign at [facility] breast density category C Breast Exam 10/13/20:  Benign  Anxiety: On antianxiety medications   I will see her back in1 yearfor follow-up.

## 2020-10-13 NOTE — Telephone Encounter (Signed)
Scheduled per 10/05 los, patient received updated calender.

## 2020-10-17 ENCOUNTER — Other Ambulatory Visit: Payer: Self-pay | Admitting: Hematology and Oncology

## 2020-10-18 ENCOUNTER — Telehealth: Payer: Self-pay | Admitting: *Deleted

## 2020-10-18 ENCOUNTER — Other Ambulatory Visit: Payer: Self-pay | Admitting: Podiatry

## 2020-10-18 NOTE — Telephone Encounter (Signed)
RN attempt x1 to contact pt regarding changing from Prolia q6 months to Zometa q12 months due to insurance coverage.  No answer, LVM to return call to the office.  RN also placed high priority message to scheduling to change injection appt to infusion.

## 2020-10-18 NOTE — Progress Notes (Signed)
RE: Heidi Walls MRN: [id number] DOB: 28-Jul-1955 Date of Telemedicine Visit: 10/19/2020  Referring provider: Shanon Ace,* Primary care provider: Shanon Ace, MD  Chief Complaint: Asthma (Out of state 2 months - came back September 6th watery eyes, upset stomach, congestion, ear pop due to congestion, and constant coughing. ), Other (Had surgery in September on her right foot.  Getting a bone injection tomorrow - last for a year. Similar to Prolia ), and Allergic Rhinitis  (Mucinex has helped and taking all allergy medications )   Telemedicine Follow Up Visit via Telephone: I connected with Chynna Buerkle for a follow up on 10/19/20 by telephone and verified that I am speaking with the correct person using two identifiers.   I discussed the limitations, risks, security and privacy concerns of performing an evaluation and management service by telephone and the availability of in person appointments. I also discussed with the patient that there may be a patient responsible charge related to this service. The patient expressed understanding and agreed to proceed.  Patient is at home  Provider is at the office.  Visit start time: 9373 Visit end time: [place] consent/check in by: [employer] consent and medical assistant/nurse: Diandra  History of Present Illness: She is a 65 y.o. female, who is being followed for asthma, allergic rhinitis, allergic conjunctivitis, reflux, and history of osteoporosis.  Her past medical history is significant for left breast cancer treated with mastectomy, chemotherapy, and letrozole therapy. Her previous allergy office visit was on 04/25/2020 with Gareth Morgan, [place].  At today's visit, she reports that she has been traveling over the last 2 months and has had 2 bouts of struggling with allergies and asthma.  She reports that the first round began on September second and her symptoms included red and itchy eyes, head congestion, and  pruritus.  She reports the symptoms were beginning to resolve within the week and then she began to experience symptoms again on September 9 including head congestion, cough, and clear rhinorrhea.  At today's visit she reports that her asthma has not been well controlled with symptoms including shortness of breath and cough that started out as dry and is now producing clear mucus.  She denies fever, body aches, sweats, chills, and sick contacts.  She began using albuterol for about a week with no improvement and, 3 days ago, added Alvesco 80-2 puffs twice a day with mild improvement of symptoms.  Allergic rhinitis is reported as poorly controlled with symptoms including clear rhinorrhea, nasal congestion, and copious postnasal drainage with frequent throat clearing.  She continues Nasacort daily, carbinoxamine 4 mg twice a day, and nasal saline rinses twice a day.  She has tried azelastine in the past with no relief of symptoms.  Allergic conjunctivitis is reported as well controlled with olopatadine as needed.  Reflux is reported as moderately well controlled with heartburn occurring on Sunday and Monday.  She continues famotidine twice a day and takes a PPI daily. She reports that she has recently broken her right foot which required a surgery. She has been receiving Prolia infusions once every 6 months for osteoporosis, however, due to a change in insurance coverage she is changing to Zometa infusion, which she will receive tomorrow. Her current medications are listed in the chart.   Assessment and Plan: Adreena is a 65 y.o. female with: Patient Instructions  Asthma Continue Alvesco 80-2 puffs twice a day with a spacer to prevent cough or wheeze.  Rinse and spit  after each use of Alvesco.   Use albuterol 20 minutes before using Alvesco for the next several days. Continue albuterol 2 puffs every 4 hours as needed for cough or wheeze You may use albuterol 2 puffs 5-15 minutes before activity to decrease  cough or wheeze  Allergic rhinitis Continue Nasacort 1-2 sprays in each nostril once a day as needed for a stuffy nose Increase carbinoxamine to 8 mg in the morning and 8 mg in the evening for the next week.  Then, if you are feeling better, return to previous dosing of carbinoxamine 4 mg in the morning and 4 mg in the evening. Continue azelastine 1-2 sprays in each nostril twice a day as needed for a runny nose or sneezing Continue nasal saline rinses as needed for nasal symptoms Continue allergen avoidance measures directed toward mold and dust mites as listed below  Allergic conjunctivitis Some over the counter eye drops include Pataday one drop in each eye once a day as needed for red, itchy eyes OR Zaditor one drop in each eye twice a day as needed for red itchy eyes.  Reflux Continue famotidine 20 mg twice a day for reflux Continue either omeprazole 20 mg or esomeprazole once a day as previously prescribed Continue dietary and lifestyle modifications as listed below  Call the clinic if this treatment plan is not working well for you, if you develop a fever, your symptoms worsen, or do not improve  Follow up in 6 months or sooner if needed.  Return in about 6 months (around 04/19/2021), or if symptoms worsen or fail to improve.   Medication List:  Current Outpatient Medications  Medication Sig Dispense Refill   acetaminophen (TYLENOL) 325 MG tablet Take 650 mg by mouth every 4 (four) hours as needed.      albuterol (VENTOLIN HFA) 108 (90 Base) MCG/ACT inhaler Inhale 2 puffs into the lungs every 6 (six) hours as needed for wheezing or shortness of breath. 8 g 2   alendronate (FOSAMAX) 70 MG tablet Take 70 mg by mouth once a week.     atorvastatin (LIPITOR) 10 MG tablet Take 1 tablet by mouth daily.     Biotin 5 MG CAPS Take by mouth.     bupivacaine (MARCAINE) 0.25 % injection Inject into the skin.     busPIRone (BUSPAR) 5 MG tablet Take 5 mg by mouth 2 (two) times daily.      Calcium Carb-Cholecalciferol (CALCIUM 600 + D PO) Take 1 tablet by mouth daily.     Carbinoxamine Maleate 4 MG TABS Take 1 tablet (4 mg total) by mouth 4 (four) times daily as needed. 120 tablet 0   cephALEXin (KEFLEX) 500 MG capsule Take 1 tablet by mouth tonight, then one tomorrow AM and one in PM 3 capsule 0   ciclesonide (ALVESCO) 80 MCG/ACT inhaler Inhale 1 puff into the lungs 2 (two) times daily. 1 each 3   DULoxetine (CYMBALTA) 60 MG capsule Take 1 capsule (60 mg total) by mouth daily. 90 capsule 3   esomeprazole (NEXIUM) 20 MG capsule Take 1 capsule (20 mg total) by mouth daily. 90 capsule 0   famotidine (PEPCID) 20 MG tablet Take 1 tablet (20 mg total) by mouth 2 (two) times daily. 60 tablet 5   ferrous sulfate 325 (65 FE) MG EC tablet Take by mouth.     gabapentin (NEURONTIN) 800 MG tablet Take 1 tablet (800 mg total) by mouth 3 (three) times daily. (Patient taking differently: Take 800 mg by  mouth 2 (two) times daily.) 90 tablet 5   hydrOXYzine (ATARAX/VISTARIL) 25 MG tablet Take 25 mg by mouth at bedtime.     loratadine (CLARITIN) 10 MG tablet Take by mouth.     montelukast (SINGULAIR) 10 MG tablet Take 10 mg by mouth at bedtime.     Multiple Vitamin (MULTIVITAMIN) tablet Take 1 tablet by mouth daily.     NASAL SALINE NA Place into the nose daily. Nasal saline wash every morning      olmesartan-hydrochlorothiazide (BENICAR HCT) 20-12.5 MG tablet Take 1 tablet by mouth daily.     Omega-3 Fatty Acids (FISH OIL) 1200 MG CAPS Take 1,200 mg by mouth daily.      omeprazole (PRILOSEC) 20 MG capsule Take 1 capsule (20 mg total) by mouth daily. 90 capsule 3   ondansetron (ZOFRAN) 4 MG tablet Take 1 tablet (4 mg total) by mouth every 8 (eight) hours as needed for nausea or vomiting. 20 tablet 0   oxyCODONE-acetaminophen (PERCOCET) 5-325 MG tablet Take 1 tablet by mouth every 4 (four) hours as needed for severe pain. 20 tablet 0   Polyethyl Glycol-Propyl Glycol (SYSTANE OP) Apply to eye. 2-3  drops daily     Probiotic Product (PROBIOTIC DAILY PO) Take by mouth.     promethazine-dextromethorphan (PROMETHAZINE-DM) 6.25-15 MG/5ML syrup Take 5 mLs by mouth 3 (three) times daily as needed.     rivaroxaban (XARELTO) 10 MG TABS tablet Take 1 tablet (10 mg total) by mouth daily. 30 tablet 0   tiZANidine (ZANAFLEX) 4 MG tablet Take 4 mg by mouth 3 (three) times daily.     triamcinolone (NASACORT) 55 MCG/ACT AERO nasal inhaler Place 2 sprays into the nose daily. (Patient taking differently: Place 2 sprays into the nose as needed.) 1 Inhaler 5   valACYclovir (VALTREX) 1000 MG tablet Take 1,000 mg by mouth 2 (two) times daily.     vitamin B-12 (CYANOCOBALAMIN) 250 MCG tablet Take by mouth.     ZINC SULFATE PO Take by mouth.     No current facility-administered medications for this visit.   Allergies: Allergies  Allergen Reactions   Codeine Shortness Of Breath    Pt reports this was 30 years ago.    Nsaids    Lisinopril Cough   Bee Pollen Other (See Comments)   Pollen Extract    Sulfamethoxazole    I reviewed her past medical history, social history, family history, and environmental history and no significant changes have been reported from previous visit on 04/25/2020.  Objective: Physical Exam Not obtained as encounter was done via telephone.   Previous notes and tests were reviewed.  I discussed the assessment and treatment plan with the patient. The patient was provided an opportunity to ask questions and all were answered. The patient agreed with the plan and demonstrated an understanding of the instructions.   The patient was advised to call back or seek an in-person evaluation if the symptoms worsen or if the condition fails to improve as anticipated.  I provided 23 minutes of non-face-to-face time during this encounter.  It was my pleasure to participate in [place] care today. Please feel free to contact me with any questions or concerns.   Sincerely,  Gareth Morgan, FNP

## 2020-10-18 NOTE — Patient Instructions (Addendum)
Asthma Continue Alvesco 80-2 puffs twice a day with a spacer to prevent cough or wheeze.  Rinse and spit after each use of Alvesco.   Use albuterol 20 minutes before using Alvesco for the next several days. Continue albuterol 2 puffs every 4 hours as needed for cough or wheeze You may use albuterol 2 puffs 5-15 minutes before activity to decrease cough or wheeze  Allergic rhinitis Continue Nasacort 1-2 sprays in each nostril once a day as needed for a stuffy nose Increase carbinoxamine to 8 mg in the morning and 8 mg in the evening for the next week.  Then, if you are feeling better, return to previous dosing of carbinoxamine 4 mg in the morning and 4 mg in the evening. Continue azelastine 1-2 sprays in each nostril twice a day as needed for a runny nose or sneezing Continue nasal saline rinses as needed for nasal symptoms Continue allergen avoidance measures directed toward mold and dust mites as listed below  Allergic conjunctivitis Some over the counter eye drops include Pataday one drop in each eye once a day as needed for red, itchy eyes OR Zaditor one drop in each eye twice a day as needed for red itchy eyes.  Reflux Continue famotidine 20 mg twice a day for reflux Continue either omeprazole 20 mg or esomeprazole once a day as previously prescribed Continue dietary and lifestyle modifications as listed below  Call the clinic if this treatment plan is not working well for you, if you develop a fever, your symptoms worsen, or do not improve  Follow up in 6 months or sooner if needed.  Lifestyle Changes for Controlling GERD When you have GERD, stomach acid feels as if it's backing up toward your mouth. Whether or not you take medication to control your GERD, your symptoms can often be improved with lifestyle changes.   Raise Your Head Reflux is more likely to strike when you're lying down flat, because stomach fluid can flow backward more easily. Raising the head of your bed 4-6  inches can help. To do this: Slide blocks or books under the legs at the head of your bed. Or, place a wedge under the mattress. Many foam stores can make a suitable wedge for you. The wedge should run from your waist to the top of your head. Don't just prop your head on several pillows. This increases pressure on your stomach. It can make GERD worse.  Watch Your Eating Habits Certain foods may increase the acid in your stomach or relax the lower esophageal sphincter, making GERD more likely. It's best to avoid the following: Coffee, tea, and carbonated drinks (with and without caffeine) Fatty, fried, or spicy food Mint, chocolate, onions, and tomatoes Any other foods that seem to irritate your stomach or cause you pain  Relieve the Pressure Eat smaller meals, even if you have to eat more often. Don't lie down right after you eat. Wait a few hours for your stomach to empty. Avoid tight belts and tight-fitting clothes. Lose excess weight.  Tobacco and Alcohol Avoid smoking tobacco and drinking alcohol. They can make GERD symptoms worse.  Control of Mold Allergen Mold and fungi can grow on a variety of surfaces provided certain temperature and moisture conditions exist.  Outdoor molds grow on plants, decaying vegetation and soil.  The major outdoor mold, Alternaria and Cladosporium, are found in very high numbers during hot and dry conditions.  Generally, a late Summer - Fall peak is seen for common outdoor fungal  spores.  Rain will temporarily lower outdoor mold spore count, but counts rise rapidly when the rainy period ends.  The most important indoor molds are Aspergillus and Penicillium.  Dark, humid and poorly ventilated basements are ideal sites for mold growth.  The next most common sites of mold growth are the bathroom and the kitchen.  Outdoor [employer] Use air conditioning and keep windows closed Avoid exposure to decaying vegetation. Avoid leaf raking. Avoid grain  handling. Consider wearing a face mask if working in moldy areas.  Indoor Mold Control Maintain humidity below 50%. Clean washable surfaces with 5% bleach solution. Remove sources e.g. Contaminated carpets.   Control of Dust Mite Allergen Dust mites play a major role in allergic asthma and rhinitis. They occur in environments with high humidity wherever human skin is found. Dust mites absorb humidity from the atmosphere (ie, they do not drink) and feed on organic matter (including shed human and animal skin). Dust mites are a microscopic type of insect that you cannot see with the naked eye. High levels of dust mites have been detected from mattresses, pillows, carpets, upholstered furniture, bed covers, clothes, soft toys and any woven material. The principal allergen of the dust mite is found in its feces. A gram of dust may contain 1,000 mites and 250,000 fecal particles. Mite antigen is easily measured in the air during house cleaning activities. Dust mites do not bite and do not cause harm to humans, other than by triggering allergies/asthma.  Ways to decrease your exposure to dust mites in your home:  1. Encase mattresses, box springs and pillows with a mite-impermeable barrier or cover  2. Wash sheets, blankets and drapes weekly in hot water (130 F) with detergent and dry them in a dryer on the hot setting.  3. Have the room cleaned frequently with a vacuum cleaner and a damp dust-mop. For carpeting or rugs, vacuuming with a vacuum cleaner equipped with a high-efficiency particulate air (HEPA) filter. The dust mite allergic individual should not be in a room which is being cleaned and should wait 1 hour after cleaning before going into the room.  4. Do not sleep on upholstered furniture (eg, couches).  5. If possible removing carpeting, upholstered furniture and drapery from the home is ideal. Horizontal blinds should be eliminated in the rooms where the person spends the most time  (bedroom, study, television room). Washable vinyl, roller-type shades are optimal.  6. Remove all non-washable stuffed toys from the bedroom. Wash stuffed toys weekly like sheets and blankets above.  7. Reduce indoor humidity to less than 50%. Inexpensive humidity monitors can be purchased at most hardware stores. Do not use a humidifier as can make the problem worse and are not recommended.

## 2020-10-19 ENCOUNTER — Other Ambulatory Visit: Payer: Self-pay

## 2020-10-19 ENCOUNTER — Ambulatory Visit (INDEPENDENT_AMBULATORY_CARE_PROVIDER_SITE_OTHER): Payer: Medicare Other | Admitting: Family Medicine

## 2020-10-19 ENCOUNTER — Encounter: Payer: Self-pay | Admitting: Family Medicine

## 2020-10-19 VITALS — Ht 63.0 in | Wt 173.0 lb

## 2020-10-19 DIAGNOSIS — H1013 Acute atopic conjunctivitis, bilateral: Secondary | ICD-10-CM | POA: Diagnosis not present

## 2020-10-19 DIAGNOSIS — M810 Age-related osteoporosis without current pathological fracture: Secondary | ICD-10-CM

## 2020-10-19 DIAGNOSIS — J3089 Other allergic rhinitis: Secondary | ICD-10-CM | POA: Diagnosis not present

## 2020-10-19 DIAGNOSIS — J4540 Moderate persistent asthma, uncomplicated: Secondary | ICD-10-CM

## 2020-10-19 DIAGNOSIS — K219 Gastro-esophageal reflux disease without esophagitis: Secondary | ICD-10-CM

## 2020-10-20 ENCOUNTER — Inpatient Hospital Stay: Payer: Medicare Other

## 2020-10-20 VITALS — BP 133/88 | HR 84 | Temp 98.8°F | Resp 16

## 2020-10-20 DIAGNOSIS — C50212 Malignant neoplasm of upper-inner quadrant of left female breast: Secondary | ICD-10-CM | POA: Diagnosis not present

## 2020-10-20 DIAGNOSIS — M810 Age-related osteoporosis without current pathological fracture: Secondary | ICD-10-CM

## 2020-10-20 DIAGNOSIS — Z170 Estrogen receptor positive status [ER+]: Secondary | ICD-10-CM

## 2020-10-20 LAB — CMP (CANCER CENTER ONLY)
ALT: 25 U/L (ref 0–44)
AST: 27 U/L (ref 15–41)
Albumin: 4.4 g/dL (ref 3.5–5.0)
Alkaline Phosphatase: 88 U/L (ref 38–126)
Anion gap: 10 (ref 5–15)
BUN: 24 mg/dL — ABNORMAL HIGH (ref 8–23)
CO2: 27 mmol/L (ref 22–32)
Calcium: 10 mg/dL (ref 8.9–10.3)
Chloride: 99 mmol/L (ref 98–111)
Creatinine: 1.23 mg/dL — ABNORMAL HIGH (ref 0.44–1.00)
GFR, Estimated: 49 mL/min — ABNORMAL LOW (ref >60)
Glucose, Bld: 135 mg/dL — ABNORMAL HIGH (ref 70–99)
Potassium: 3.7 mmol/L (ref 3.5–5.1)
Sodium: 136 mmol/L (ref 135–145)
Total Bilirubin: 0.7 mg/dL (ref 0.3–1.2)
Total Protein: 7.7 g/dL (ref 6.5–8.1)

## 2020-10-20 LAB — CBC WITH DIFFERENTIAL (CANCER CENTER ONLY)
Abs Immature Granulocytes: 0.03 10*3/uL (ref 0.00–0.07)
Basophils Absolute: 0 10*3/uL (ref 0.0–0.1)
Basophils Relative: 0 %
Eosinophils Absolute: 0.2 10*3/uL (ref 0.0–0.5)
Eosinophils Relative: 2 %
HCT: 32.7 % — ABNORMAL LOW (ref 36.0–46.0)
Hemoglobin: 10.5 g/dL — ABNORMAL LOW (ref 12.0–15.0)
Immature Granulocytes: 0 %
Lymphocytes Relative: 26 %
Lymphs Abs: 1.9 10*3/uL (ref 0.7–4.0)
MCH: 26.9 pg (ref 26.0–34.0)
MCHC: 32.1 g/dL (ref 30.0–36.0)
MCV: 83.8 fL (ref 80.0–100.0)
Monocytes Absolute: 0.7 10*3/uL (ref 0.1–1.0)
Monocytes Relative: 9 %
Neutro Abs: 4.7 10*3/uL (ref 1.7–7.7)
Neutrophils Relative %: 63 %
Platelet Count: 279 10*3/uL (ref 150–400)
RBC: 3.9 MIL/uL (ref 3.87–5.11)
RDW: 14.8 % (ref 11.5–15.5)
WBC Count: 7.5 10*3/uL (ref 4.0–10.5)
nRBC: 0 % (ref 0.0–0.2)

## 2020-10-20 MED ORDER — ZOLEDRONIC ACID 4 MG/5ML IV CONC
3.5000 mg | Freq: Once | INTRAVENOUS | Status: AC
  Administered 2020-10-20: 3.5 mg via INTRAVENOUS
  Filled 2020-10-20: qty 4.38

## 2020-10-20 MED ORDER — SODIUM CHLORIDE 0.9 % IV SOLN: Freq: Once | INTRAVENOUS | Status: AC

## 2020-10-20 MED ORDER — ZOLEDRONIC ACID 4 MG/100ML IV SOLN: 4.0000 mg | Freq: Once | INTRAVENOUS | Status: DC

## 2020-10-20 NOTE — Patient Instructions (Signed)

## 2020-10-21 ENCOUNTER — Telehealth: Payer: Self-pay | Admitting: Family Medicine

## 2020-10-21 NOTE — Telephone Encounter (Signed)
Thank you :)

## 2020-10-21 NOTE — Telephone Encounter (Signed)
Wanted to let you know she feels a lot better

## 2020-10-24 ENCOUNTER — Ambulatory Visit (INDEPENDENT_AMBULATORY_CARE_PROVIDER_SITE_OTHER): Payer: Medicare Other | Admitting: Podiatry

## 2020-10-24 ENCOUNTER — Ambulatory Visit (INDEPENDENT_AMBULATORY_CARE_PROVIDER_SITE_OTHER): Payer: Medicare Other

## 2020-10-24 ENCOUNTER — Other Ambulatory Visit: Payer: Self-pay

## 2020-10-24 DIAGNOSIS — Z9889 Other specified postprocedural states: Secondary | ICD-10-CM

## 2020-10-24 NOTE — Progress Notes (Signed)
  Subjective:  Patient ID: Heidi Walls, female    DOB: 03-21-1955,  MRN: [id number]  Chief Complaint  Patient presents with   Routine Post Op    POV#3 -pt deneis N/V/f?Ch -Tx: dressing change by using bandaid with neosporin, scooter and boot -pt states," dong fine with occaisonal pain at times; 8/10."    DOS: 09/21/20 Procedure: ORIF 5th metatarsal right  65 y.o. female presents with the above complaint. History confirmed with patient.   Recently got an injection to help with bone density /osteoporosis Objective:  Physical Exam: tenderness at the surgical site, local edema noted, and calf supple, nontender. Incision: healing well, 1cm linear area of delayed healing without warmth, erythema, SOI  No images are attached to the encounter.  Radiographs: X-ray of the right foot: consistent with post-op state, with acceptable alignment and no evidence of hardware complication - consolidation of fracture site noted.  Assessment:   1. S/P foot surgery, right    Plan:  Patient was evaluated and treated and all questions answered.  Post-operative State -XR reviewed with patient -Staples removed -Dressing applied consisting of povidone, band-aid, and ACE bandage -NWB with knee scooter x2 weeks, then start WB -XRs needed at follow-up: 3 view Foot  Return in about 3 weeks (around 11/14/2020) for Post-Op (with XRs).

## 2020-11-02 ENCOUNTER — Other Ambulatory Visit: Payer: Self-pay | Admitting: Family Medicine

## 2020-11-07 ENCOUNTER — Telehealth: Payer: Self-pay | Admitting: Podiatry

## 2020-11-07 NOTE — Telephone Encounter (Signed)
Pt says you told her she could walk today so she is wanting to know does she need to wear boot or regular shoes, etc.

## 2020-11-07 NOTE — Telephone Encounter (Signed)
Must wear boot while WB

## 2020-11-07 NOTE — Telephone Encounter (Signed)
Pt notified/reb °

## 2020-11-14 ENCOUNTER — Ambulatory Visit (INDEPENDENT_AMBULATORY_CARE_PROVIDER_SITE_OTHER): Payer: Medicare Other | Admitting: Podiatry

## 2020-11-14 ENCOUNTER — Ambulatory Visit (INDEPENDENT_AMBULATORY_CARE_PROVIDER_SITE_OTHER): Payer: Medicare Other

## 2020-11-14 DIAGNOSIS — S92351D Displaced fracture of fifth metatarsal bone, right foot, subsequent encounter for fracture with routine healing: Secondary | ICD-10-CM

## 2020-11-14 DIAGNOSIS — Z9889 Other specified postprocedural states: Secondary | ICD-10-CM | POA: Diagnosis not present

## 2020-11-14 DIAGNOSIS — S99191G Other physeal fracture of right metatarsal, subsequent encounter for fracture with delayed healing: Secondary | ICD-10-CM

## 2020-11-14 DIAGNOSIS — S92351A Displaced fracture of fifth metatarsal bone, right foot, initial encounter for closed fracture: Secondary | ICD-10-CM

## 2020-11-14 NOTE — Progress Notes (Signed)
  Subjective:  Patient ID: Heidi Walls, female    DOB: 07-07-1955,  MRN: [id number]  Chief Complaint  Patient presents with   Routine Post Op    POV #4 -pt deneis N/V/f?Ch -pt states," doing great no pain." - only with itching  Tx:P boot, heat and elevation    DOS: 09/21/20 Procedure: ORIF 5th metatarsal right  65 y.o. female presents with the above complaint. History confirmed with patient.   Objective:  Physical Exam: mild tenderness at the surgical site, local edema noted, and calf supple, nontender. Incision: well healed  No images are attached to the encounter.  Radiographs: X-ray of the right foot: consistent with post-op state, with acceptable alignment and no evidence of hardware complication - near full consolidation of fracture site noted.  Assessment:   1. S/P foot surgery, right   2. Closed fracture of base of fifth metatarsal bone with routine healing, right    Plan:  Patient was evaluated and treated and all questions answered.  Post-operative State -XR reviewed -WBAT in ankle brace and sneaker. Dispensed brace today. -Ok for slow return to activity and driving -XRs needed at follow-up: 3 view Foot  Return in about 3 weeks (around 12/05/2020) for Post-Op (with XRs).

## 2020-11-18 ENCOUNTER — Other Ambulatory Visit: Payer: Self-pay | Admitting: Podiatry

## 2020-11-18 DIAGNOSIS — S99191G Other physeal fracture of right metatarsal, subsequent encounter for fracture with delayed healing: Secondary | ICD-10-CM

## 2020-11-18 DIAGNOSIS — Z9889 Other specified postprocedural states: Secondary | ICD-10-CM

## 2020-12-08 ENCOUNTER — Ambulatory Visit (INDEPENDENT_AMBULATORY_CARE_PROVIDER_SITE_OTHER): Payer: Medicare Other

## 2020-12-08 ENCOUNTER — Encounter: Payer: Self-pay | Admitting: Podiatry

## 2020-12-08 ENCOUNTER — Ambulatory Visit (INDEPENDENT_AMBULATORY_CARE_PROVIDER_SITE_OTHER): Payer: Medicare Other | Admitting: Podiatry

## 2020-12-08 DIAGNOSIS — S92351D Displaced fracture of fifth metatarsal bone, right foot, subsequent encounter for fracture with routine healing: Secondary | ICD-10-CM

## 2020-12-09 NOTE — Progress Notes (Signed)
  Subjective:  Patient ID: Heidi Walls, female    DOB: 1955/08/02,  MRN: [id number]  Chief Complaint  Patient presents with   Routine Post Op    I am doing better and I can actually walk a block now on the right foot    DOS: 09/21/20 Procedure: ORIF 5th metatarsal right  65 y.o. female presents with the above complaint. History confirmed with patient.   Objective:  Physical Exam: mild no tenderness at the surgical site, local edema noted, and calf supple, nontender. Good strength in DF/PF/Inv/Ev Incision: well healed  Assessment:   1. Closed fracture of base of fifth metatarsal bone with routine healing, right    Plan:  Patient was evaluated and treated and all questions answered.  Post-operative State -XR reviewed - full healing of fracture noted. -WBAT in normal shoegear. No restrictions -Resume full activity -XRs needed at follow-up: none (only if pain persists).  No follow-ups on file.

## 2020-12-23 ENCOUNTER — Telehealth: Payer: Self-pay | Admitting: Family Medicine

## 2020-12-23 ENCOUNTER — Other Ambulatory Visit: Payer: Self-pay

## 2020-12-23 MED ORDER — ALBUTEROL SULFATE HFA 108 (90 BASE) MCG/ACT IN AERS: 2.0000 | INHALATION_SPRAY | Freq: Four times a day (QID) | RESPIRATORY_TRACT | 2 refills | Status: DC | PRN

## 2020-12-23 NOTE — Telephone Encounter (Signed)
Okay TY

## 2020-12-23 NOTE — Telephone Encounter (Signed)
Please refill. Looks like her last was 12/19/20. Thank you

## 2020-12-23 NOTE — Telephone Encounter (Signed)
Ok to refill. Thank you.

## 2020-12-23 NOTE — Telephone Encounter (Signed)
Pt request refill for Pro air, she says she has a cough every now and then and the pro air seems to help. [facility] is preferred pharmacy

## 2020-12-26 NOTE — Telephone Encounter (Signed)
Okay; thanks.

## 2021-02-09 ENCOUNTER — Encounter: Payer: Medicare Other | Admitting: Podiatry

## 2021-02-13 ENCOUNTER — Ambulatory Visit (INDEPENDENT_AMBULATORY_CARE_PROVIDER_SITE_OTHER): Payer: Medicare Other

## 2021-02-13 ENCOUNTER — Encounter: Payer: Self-pay | Admitting: Podiatry

## 2021-02-13 ENCOUNTER — Ambulatory Visit (INDEPENDENT_AMBULATORY_CARE_PROVIDER_SITE_OTHER): Payer: Medicare Other | Admitting: Podiatry

## 2021-02-13 DIAGNOSIS — S92351D Displaced fracture of fifth metatarsal bone, right foot, subsequent encounter for fracture with routine healing: Secondary | ICD-10-CM | POA: Diagnosis not present

## 2021-02-13 NOTE — Progress Notes (Signed)
°  Subjective:  Patient ID: Heidi Walls, female    DOB: 11/15/1955,  MRN: [id number]  Chief Complaint  Patient presents with   Routine Post Op    I am doing good and there is some sharp pains every now and then on the right foot    DOS: 09/21/20 Procedure: ORIF 5th metatarsal right  66 y.o. female presents with the above complaint. History confirmed with patient. Doing very well only has occasional pain in the foot that does not last. Thinks the surgical area is more "touchy" than painful.  Objective:  Physical Exam: no tenderness at the surgical site, local edema noted, and calf supple, nontender. Good strength in DF/PF/Inv/Ev Incision: well healed  Assessment:   1. Closed fracture of base of fifth metatarsal bone with routine healing, right    Plan:  Patient was evaluated and treated and all questions answered.  Post-operative State -XR reviewed - full healing of fracture noted. -Plate is slightly prominent we could discuss removal if she has issue. -WBAT in normal shoegear. No restrictions -Continue full activity -XRs needed at follow-up: none   No follow-ups on file.

## 2021-03-01 ENCOUNTER — Other Ambulatory Visit: Payer: Self-pay | Admitting: Family Medicine

## 2021-03-16 ENCOUNTER — Other Ambulatory Visit: Payer: Self-pay | Admitting: Family Medicine

## 2021-04-13 ENCOUNTER — Ambulatory Visit: Payer: Medicare Other

## 2021-04-13 ENCOUNTER — Other Ambulatory Visit: Payer: Medicare Other

## 2021-04-28 ENCOUNTER — Other Ambulatory Visit: Payer: Self-pay | Admitting: Family Medicine

## 2021-05-03 ENCOUNTER — Other Ambulatory Visit: Payer: Self-pay | Admitting: Family Medicine

## 2021-05-12 DIAGNOSIS — E559 Vitamin D deficiency, unspecified: Secondary | ICD-10-CM | POA: Insufficient documentation

## 2021-06-06 IMAGING — MR MR KNEE*R* W/O CM
4 of 6 series · 19 of 40 positions shown · non-contrast
Comparison: None.

CLINICAL DATA: Several month history of knee pain.

EXAM:
MRI OF THE RIGHT KNEE WITHOUT CONTRAST
TECHNIQUE: Multiplanar, multisequence MR imaging of the knee was performed. No
intravenous contrast was administered.

[Series 3: T2 fat-sat · axial · 4.0mm · 0.31mm/px · z∈[-38,+36]mm · 3 of 27 slices shown (1 of 2)]
[im 5/27]
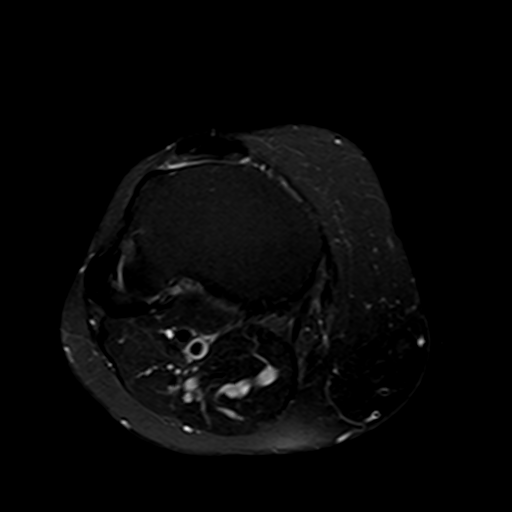
[im 14/27]
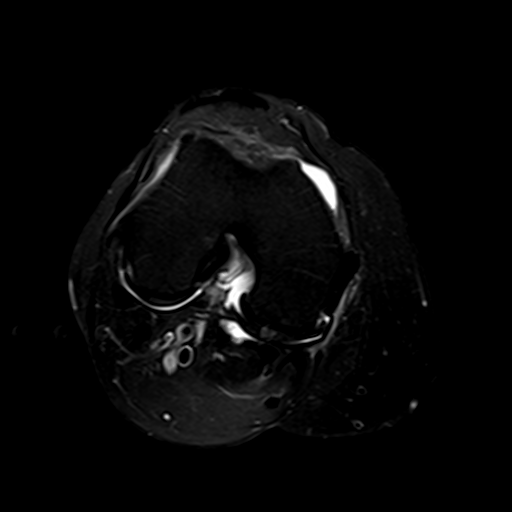
[im 22/27]
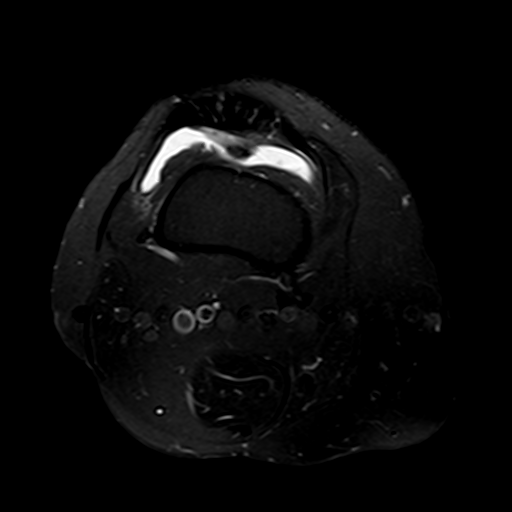

[Series 5: T2 fat-sat · coronal · 4.0mm · 0.29mm/px · 3 of 24 slices shown (2 of 2)]
[im 5/24]
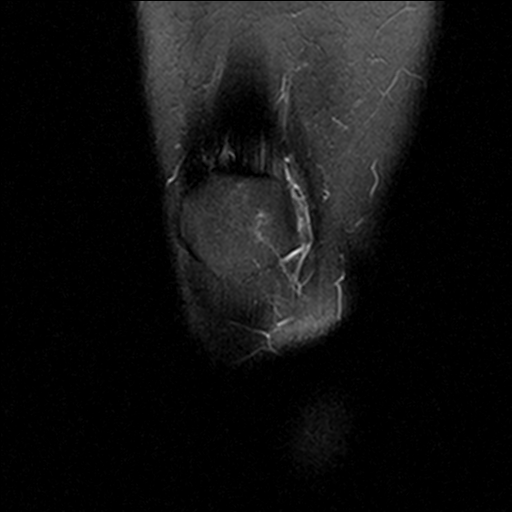
[im 14/24]
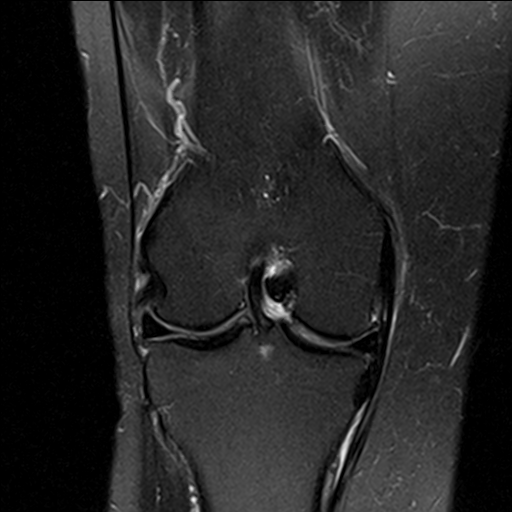
[im 24/24]
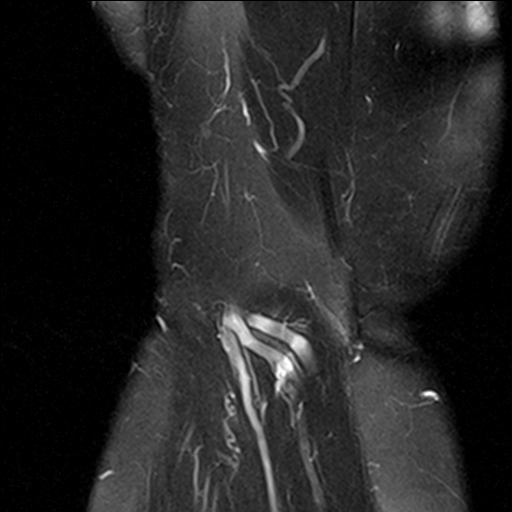

[Series 6: PD fat-sat · coronal · 3.0mm · 0.29mm/px · 7 of 28 slices shown (1 of 2)]
[im 1/28]
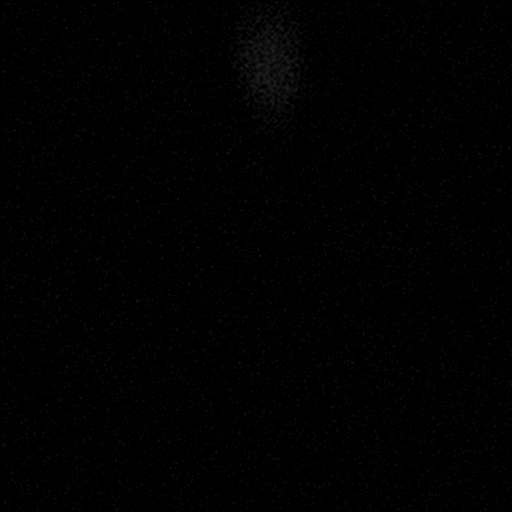
[im 5/28]
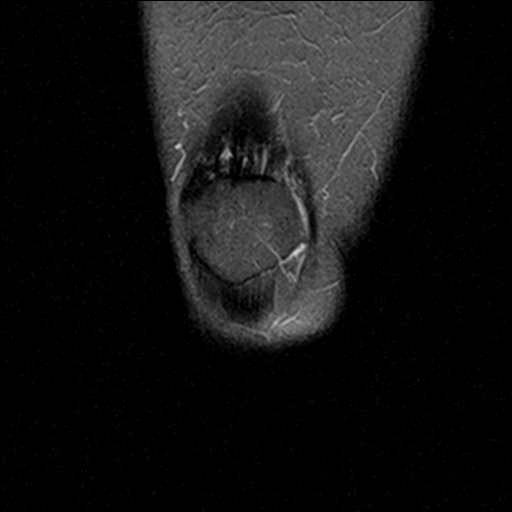
[im 10/28]
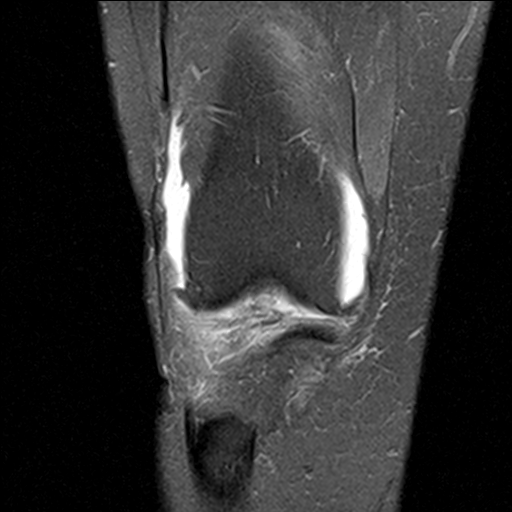
[im 14/28]
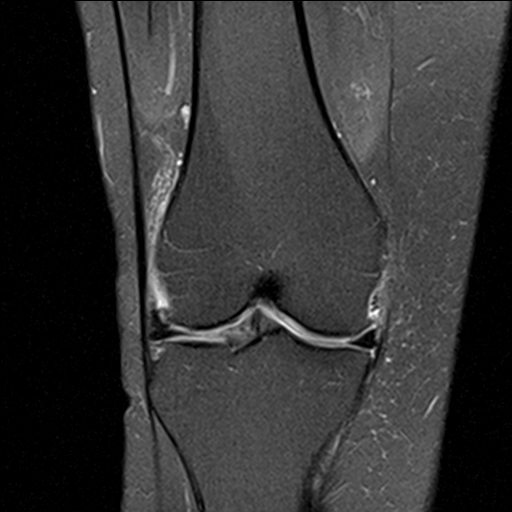
[im 19/28]
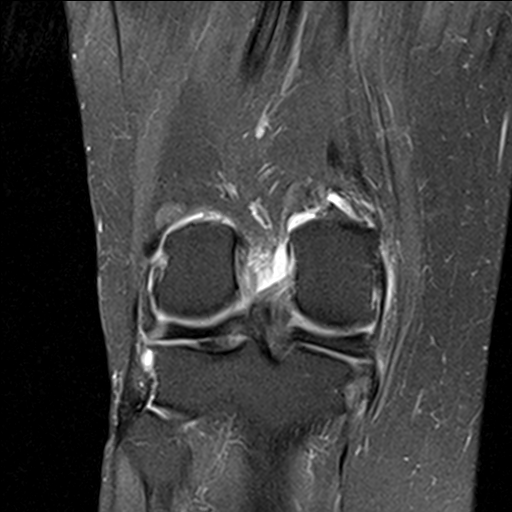
[im 23/28]
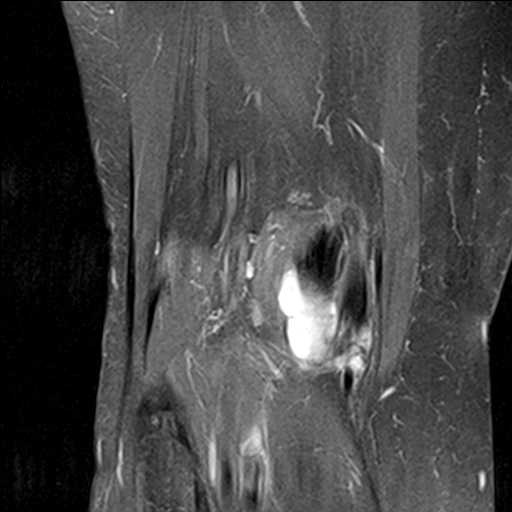
[im 28/28]
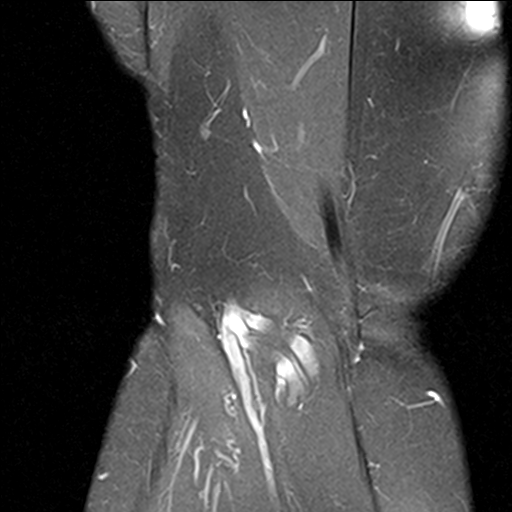

[Series 7: PD fat-sat · sagittal · 3.0mm · 0.29mm/px · 6 of 30 slices shown (2 of 2)]
[im 1/30]
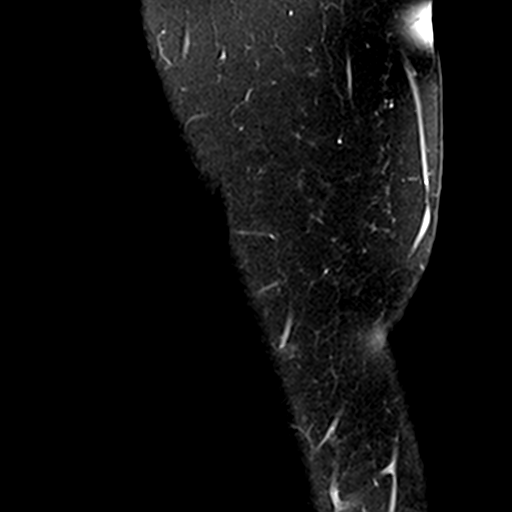
[im 5/30]
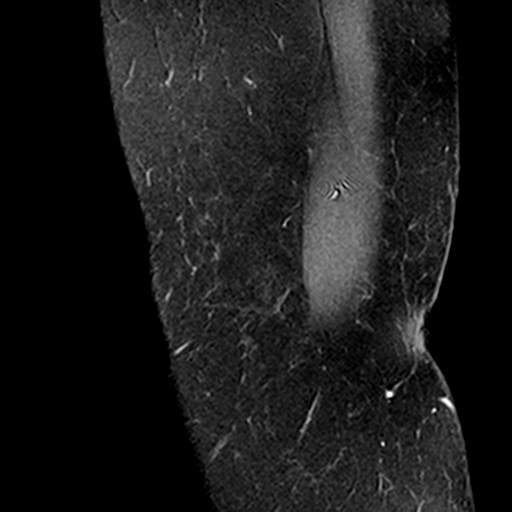
[im 10/30]
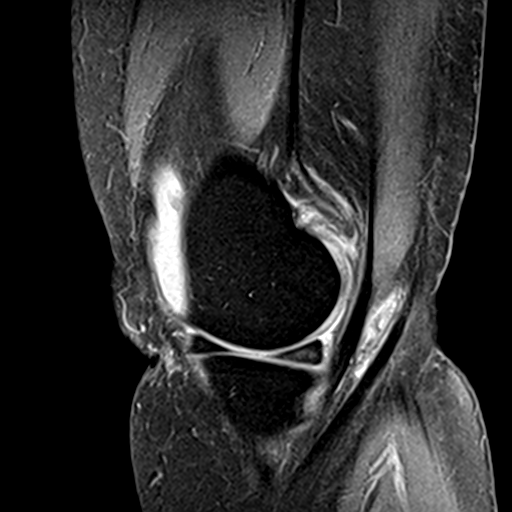
[im 15/30]
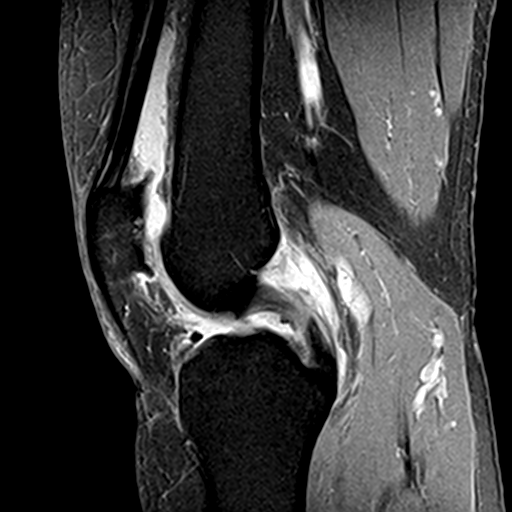
[im 20/30]
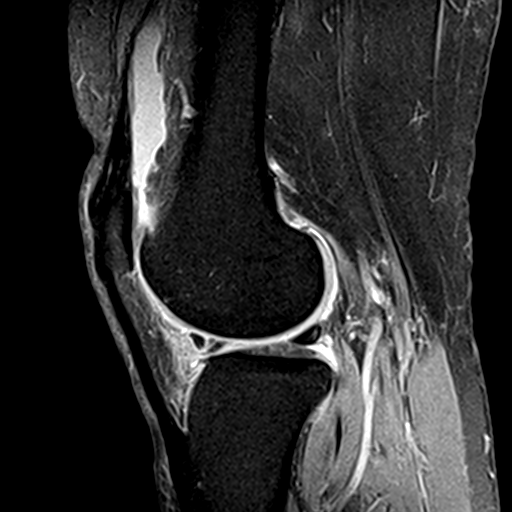
[im 25/30]
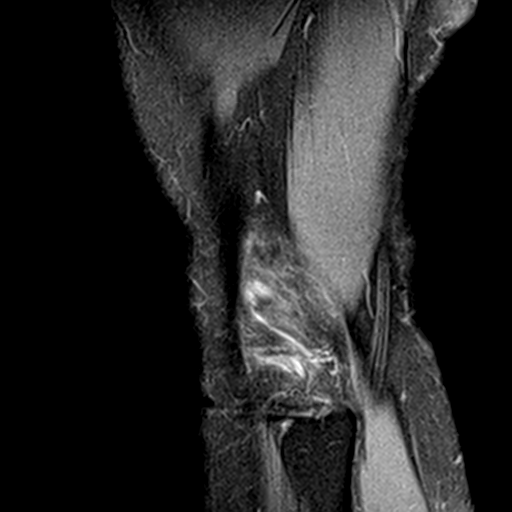

[19 of 40 positions shown; findings below may reference images not displayed]

FINDINGS: MENISCI

Medial meniscus: Intrameniscal degenerative type signal changes and
areas of mild fraying and fibrillation but no discrete tear.

Lateral meniscus: Mild degenerative changes with areas of free edge
fraying but no definite tear.

LIGAMENTS

Cruciates:  Intact

Collaterals:  Intact

CARTILAGE

Patellofemoral: Advanced degenerative chondrosis with full-thickness
cartilage loss, subchondral cystic change, joint space narrowing and
spurring. This is most significant along the lateral patellofemoral
joint.

Medial: Moderate degenerative chondrosis with early joint space
narrowing and spurring.

Lateral: Moderate degenerative chondrosis with early spurring
changes.

Joint:  Moderate to large joint effusion and mild synovitis.

Popliteal Fossa: No popliteal mass. Small to moderate-sized Baker's
cyst.

Extensor Mechanism: The patella retinacular structures are intact
and the quadriceps and patellar tendons are intact. Mild lateral
tilt and orientation of the patella in relation to the femoral
trochlear groove.

Bones:  No acute bony findings.

Other: Normal knee musculature.
IMPRESSION: 1. Tricompartmental degenerative changes most significant at the
patellofemoral joint where there is grade 4 chondromalacia, joint
space narrowing and spurring.
2. Intact ligamentous structures and no acute bony findings.
3. Meniscal degenerative changes without discrete tear.
4. Moderate to large joint effusion and mild synovitis. Small to
moderate-sized Baker's cyst.

## 2021-06-09 ENCOUNTER — Other Ambulatory Visit: Payer: Self-pay | Admitting: Family Medicine

## 2021-06-27 ENCOUNTER — Other Ambulatory Visit: Payer: Self-pay

## 2021-06-28 NOTE — Telephone Encounter (Signed)
Left patient a message that we scheduled her an appointment for Friday 06/30/2021 at 2:30 with Gareth Morgan FNP. Will call patient later to make sure she got the message.

## 2021-06-29 NOTE — Progress Notes (Signed)
[address] [place] [address] Dept: [phone]  FOLLOW UP NOTE  Patient ID: Heidi Walls, female    DOB: Apr 08, 1955  Age: 66 y.o. MRN: [id number] Date of Office Visit: 06/30/2021  Assessment  Chief Complaint: Asthma (Had bad allergy reaction, allergy medications didn't work at all. Zofran, Alvesco, allergy medication. )  HPI Heidi Walls is a 66 year old female who presents the clinic for follow-up visit.  She was last seen in this clinic on 10/19/2020 by Thermon Leyland, FNP, via telephone for evaluation of asthma, allergic rhinitis, allergic conjunctivitis, and reflux.  Her pertinent medical history includes osteoporosis and history of left-sided breast cancer.  At today's visit, she reports her asthma has been moderately well controlled with with symptoms including intermittent shortness of breath and cough producing clear mucus that began about 1 month ago.  She denies wheeze with activity and rest.  She reports that her asthma is frequently flared by weather patterns especially rain.  She continues Alvesco 160 during asthma flare only and uses albuterol infrequently.  She reports that she has recently been out of Alvesco 80 and has been using albuterol 1-2 times a day for the last several weeks.  Of note, chart review indicates absolute eosinophil count of 200 on 06/04/2021.  Allergic rhinitis is reported as moderately well controlled with symptoms including constant clear rhinorrhea, occasional nasal congestion, sneezing after coming in from outside, and frequent postnasal drainage.  She continues carbinoxamine 8 mg in the morning and 8 mg in the evening, however, she has been out of this medication for the last several weeks.  She is not currently using Nasacort, azelastine, or saline nasal rinses.  Allergic conjunctivitis is reported as well controlled with olopatadine as needed.  Reflux is reported as well controlled with ease omeprazole once a day.  Her current medications are listed  in the chart.   Drug Allergies:  Allergies  Allergen Reactions   Codeine Shortness Of Breath    Pt reports this was 30 years ago.    Nsaids    Lisinopril Cough   Bee Pollen Other (See Comments)   Pollen Extract    Sulfamethoxazole     Physical Exam: BP 126/64 (BP Location: Right Arm, Patient Position: Sitting, Cuff Size: Large)   Pulse (!) 105   Temp 97.9 F (36.6 C) (Temporal)   Resp 16   SpO2 98%    Physical Exam Vitals reviewed.  Constitutional:      Appearance: Normal appearance.  HENT:     Head: Normocephalic and atraumatic.     Right Ear: Tympanic membrane normal.     Left Ear: Tympanic membrane normal.     Nose:     Comments: Bilateral nares slightly erythematous with clear nasal drainage noted.  Pharynx slightly erythematous with no exudate.  Ears normal.  Eyes normal. Eyes:     Conjunctiva/sclera: Conjunctivae normal.  Cardiovascular:     Rate and Rhythm: Normal rate and regular rhythm.     Heart sounds: Normal heart sounds. No murmur heard. Pulmonary:     Effort: Pulmonary effort is normal.     Breath sounds: Normal breath sounds.     Comments: Lungs clear to auscultation Musculoskeletal:        General: Normal range of motion.     Cervical back: Normal range of motion and neck supple.  Skin:    General: Skin is warm and dry.  Neurological:     Mental Status: She is alert and oriented  to person, place, and time.  Psychiatric:        Mood and Affect: Mood normal.        Behavior: Behavior normal.        Thought Content: Thought content normal.        Judgment: Judgment normal.    Diagnostics: FVC 2.30, FEV1 1.77.  Predicted FVC 2.89, predicted FEV1 2.26.  Spirometry indicates normal ventilatory function.  Assessment and Plan: 1. Moderate persistent asthma without complication   2. Perinnial allergic rhinitis with a probable non-allergic component   3. Allergic conjunctivitis of both eyes   4. Gastroesophageal reflux disease, unspecified whether  esophagitis present   5. Osteoporosis, unspecified osteoporosis type, unspecified pathological fracture presence     Meds ordered this encounter  Medications   beclomethasone (QVAR REDIHALER) 80 MCG/ACT inhaler    Sig: Inhale 2 puffs into the lungs 2 (two) times daily.    Dispense:  1 each    Refill:  0    Patient Instructions  Asthma Use albuterol 20 minutes before using Alvesco for the next several days. Continue albuterol 2 puffs every 4 hours as needed for cough or wheeze You may use albuterol 2 puffs 5-15 minutes before activity to decrease cough or wheeze For asthma flare, begin Qvar 80-2 puffs twice a day for 2 weeks or until cough and wheeze free  Allergic rhinitis Continue Nasacort 1-2 sprays in each nostril once a day as needed for a stuffy nose Continue carbinoxamine 8 mg in the morning and 8 mg in the evening. Continue azelastine 1-2 sprays in each nostril twice a day as needed for a runny nose or sneezing Continue nasal saline rinses as needed for nasal symptoms Continue allergen avoidance measures directed toward mold and dust mites as listed below  Allergic conjunctivitis Some over the counter eye drops include Pataday one drop in each eye once a day as needed for red, itchy eyes OR Zaditor one drop in each eye twice a day as needed for red itchy eyes.  Reflux Continue famotidine 20 mg twice a day for reflux Continue esomeprazole once a day as previously prescribed Continue dietary and lifestyle modifications as listed below  Call the clinic if this treatment plan is not working well for you, if you develop a fever, your symptoms worsen, or do not improve  Follow up in 6 months or sooner if needed.   Return in about 6 months (around 12/30/2021), or if symptoms worsen or fail to improve.    Thank you for the opportunity to care for this patient.  Please do not hesitate to contact me with questions.  Thermon Leyland, FNP [facility]

## 2021-06-29 NOTE — Patient Instructions (Incomplete)
Asthma Continue Alvesco 80-2 puffs twice a day with a spacer to prevent cough or wheeze.  Rinse and spit after each use of Alvesco.   Use albuterol 20 minutes before using Alvesco for the next several days. Continue albuterol 2 puffs every 4 hours as needed for cough or wheeze You may use albuterol 2 puffs 5-15 minutes before activity to decrease cough or wheeze  Allergic rhinitis Continue Nasacort 1-2 sprays in each nostril once a day as needed for a stuffy nose Increase carbinoxamine to 8 mg in the morning and 8 mg in the evening for the next week.  Then, if you are feeling better, return to previous dosing of carbinoxamine 4 mg in the morning and 4 mg in the evening. Continue azelastine 1-2 sprays in each nostril twice a day as needed for a runny nose or sneezing Continue nasal saline rinses as needed for nasal symptoms Continue allergen avoidance measures directed toward mold and dust mites as listed below  Allergic conjunctivitis Some over the counter eye drops include Pataday one drop in each eye once a day as needed for red, itchy eyes OR Zaditor one drop in each eye twice a day as needed for red itchy eyes.  Reflux Continue famotidine 20 mg twice a day for reflux Continue either omeprazole 20 mg or esomeprazole once a day as previously prescribed Continue dietary and lifestyle modifications as listed below  Call the clinic if this treatment plan is not working well for you, if you develop a fever, your symptoms worsen, or do not improve  Follow up in 6 months or sooner if needed.  Lifestyle Changes for Controlling GERD When you have GERD, stomach acid feels as if it's backing up toward your mouth. Whether or not you take medication to control your GERD, your symptoms can often be improved with lifestyle changes.   Raise Your Head Reflux is more likely to strike when you're lying down flat, because stomach fluid can flow backward more easily. Raising the head of your bed 4-6  inches can help. To do this: Slide blocks or books under the legs at the head of your bed. Or, place a wedge under the mattress. Many foam stores can make a suitable wedge for you. The wedge should run from your waist to the top of your head. Don't just prop your head on several pillows. This increases pressure on your stomach. It can make GERD worse.  Watch Your Eating Habits Certain foods may increase the acid in your stomach or relax the lower esophageal sphincter, making GERD more likely. It's best to avoid the following: Coffee, tea, and carbonated drinks (with and without caffeine) Fatty, fried, or spicy food Mint, chocolate, onions, and tomatoes Any other foods that seem to irritate your stomach or cause you pain  Relieve the Pressure Eat smaller meals, even if you have to eat more often. Don't lie down right after you eat. Wait a few hours for your stomach to empty. Avoid tight belts and tight-fitting clothes. Lose excess weight.  Tobacco and Alcohol Avoid smoking tobacco and drinking alcohol. They can make GERD symptoms worse.  Control of Mold Allergen Mold and fungi can grow on a variety of surfaces provided certain temperature and moisture conditions exist.  Outdoor molds grow on plants, decaying vegetation and soil.  The major outdoor mold, Alternaria and Cladosporium, are found in very high numbers during hot and dry conditions.  Generally, a late Summer - Fall peak is seen for common outdoor fungal  spores.  Rain will temporarily lower outdoor mold spore count, but counts rise rapidly when the rainy period ends.  The most important indoor molds are Aspergillus and Penicillium.  Dark, humid and poorly ventilated basements are ideal sites for mold growth.  The next most common sites of mold growth are the bathroom and the kitchen.  Outdoor [employer] Use air conditioning and keep windows closed Avoid exposure to decaying vegetation. Avoid leaf raking. Avoid grain  handling. Consider wearing a face mask if working in moldy areas.  Indoor Mold Control Maintain humidity below 50%. Clean washable surfaces with 5% bleach solution. Remove sources e.g. Contaminated carpets.   Control of Dust Mite Allergen Dust mites play a major role in allergic asthma and rhinitis. They occur in environments with high humidity wherever human skin is found. Dust mites absorb humidity from the atmosphere (ie, they do not drink) and feed on organic matter (including shed human and animal skin). Dust mites are a microscopic type of insect that you cannot see with the naked eye. High levels of dust mites have been detected from mattresses, pillows, carpets, upholstered furniture, bed covers, clothes, soft toys and any woven material. The principal allergen of the dust mite is found in its feces. A gram of dust may contain 1,000 mites and 250,000 fecal particles. Mite antigen is easily measured in the air during house cleaning activities. Dust mites do not bite and do not cause harm to humans, other than by triggering allergies/asthma.  Ways to decrease your exposure to dust mites in your home:  1. Encase mattresses, box springs and pillows with a mite-impermeable barrier or cover  2. Wash sheets, blankets and drapes weekly in hot water (130 F) with detergent and dry them in a dryer on the hot setting.  3. Have the room cleaned frequently with a vacuum cleaner and a damp dust-mop. For carpeting or rugs, vacuuming with a vacuum cleaner equipped with a high-efficiency particulate air (HEPA) filter. The dust mite allergic individual should not be in a room which is being cleaned and should wait 1 hour after cleaning before going into the room.  4. Do not sleep on upholstered furniture (eg, couches).  5. If possible removing carpeting, upholstered furniture and drapery from the home is ideal. Horizontal blinds should be eliminated in the rooms where the person spends the most time  (bedroom, study, television room). Washable vinyl, roller-type shades are optimal.  6. Remove all non-washable stuffed toys from the bedroom. Wash stuffed toys weekly like sheets and blankets above.  7. Reduce indoor humidity to less than 50%. Inexpensive humidity monitors can be purchased at most hardware stores. Do not use a humidifier as can make the problem worse and are not recommended.

## 2021-06-30 ENCOUNTER — Encounter: Payer: Self-pay | Admitting: Family Medicine

## 2021-06-30 ENCOUNTER — Other Ambulatory Visit: Payer: Self-pay | Admitting: Family Medicine

## 2021-06-30 ENCOUNTER — Ambulatory Visit: Payer: Medicare Other | Admitting: Family Medicine

## 2021-06-30 VITALS — BP 126/64 | HR 105 | Temp 97.9°F | Resp 16

## 2021-06-30 DIAGNOSIS — J4540 Moderate persistent asthma, uncomplicated: Secondary | ICD-10-CM

## 2021-06-30 DIAGNOSIS — J3089 Other allergic rhinitis: Secondary | ICD-10-CM | POA: Diagnosis not present

## 2021-06-30 DIAGNOSIS — H1013 Acute atopic conjunctivitis, bilateral: Secondary | ICD-10-CM | POA: Insufficient documentation

## 2021-06-30 DIAGNOSIS — M810 Age-related osteoporosis without current pathological fracture: Secondary | ICD-10-CM

## 2021-06-30 DIAGNOSIS — K219 Gastro-esophageal reflux disease without esophagitis: Secondary | ICD-10-CM | POA: Diagnosis not present

## 2021-06-30 MED ORDER — QVAR REDIHALER 80 MCG/ACT IN AERB: 2.0000 | INHALATION_SPRAY | Freq: Two times a day (BID) | RESPIRATORY_TRACT | 0 refills | Status: DC

## 2021-07-05 NOTE — Telephone Encounter (Signed)
Thank you :)

## 2021-07-06 ENCOUNTER — Other Ambulatory Visit: Payer: Self-pay

## 2021-07-06 ENCOUNTER — Telehealth: Payer: Self-pay

## 2021-07-06 MED ORDER — QVAR REDIHALER 80 MCG/ACT IN AERB: INHALATION_SPRAY | RESPIRATORY_TRACT | 5 refills | Status: DC

## 2021-07-06 NOTE — Telephone Encounter (Signed)
Spoke with the patient and she picked up the incruise ellipta. She had taken one dose and she still continues to cough and now sounds hoarse. Since the patient has picked up the incruise ellipta I had to send in a new rx of the qvar 80 redihaler that was approved. New rx for qvar 80 redihaler was [facility]. Patient aware. Will also fax the approval from [facility].

## 2021-07-06 NOTE — Telephone Encounter (Signed)
Qvar 80 mcg redihaler was approved until 01/07/2022 and fax'd to patient's pharmacy. Patient is aware.

## 2021-07-06 NOTE — Telephone Encounter (Signed)
error 

## 2021-07-11 DIAGNOSIS — J3089 Other allergic rhinitis: Secondary | ICD-10-CM | POA: Insufficient documentation

## 2021-08-31 ENCOUNTER — Other Ambulatory Visit: Payer: Self-pay | Admitting: Family Medicine

## 2021-09-25 DIAGNOSIS — I471 Supraventricular tachycardia, unspecified: Secondary | ICD-10-CM | POA: Insufficient documentation

## 2021-10-10 NOTE — Progress Notes (Signed)
Patient Care Team: Melvenia Beam, MD as PCP - General (Family Medicine) Serena Croissant, MD as Consulting Physician (Hematology and Oncology) Axel Filler, Larna Daughters, NP as Nurse Practitioner (Hematology and Oncology) Glenna Fellows, MD as Consulting Physician (Plastic Surgery) Glenna Fellows, MD (Inactive) as Consulting Physician (General Surgery)  DIAGNOSIS: No diagnosis found.  SUMMARY OF ONCOLOGIC HISTORY: Oncology History  Breast cancer of upper-inner quadrant of left female breast (HCC)  05/26/2015 Initial Diagnosis   Screen detected left breast asymmetry (posterior 1.6 cm): Grade 2-3 IDC ER/PR positive HER-2 positive Ki-67 20% plus calcs (span 6.1 cm): High-grade DCIS with suspicious foci of invasion (4.2 cm apart)   06/24/2015 Surgery   Left simple mastectomy with reconstruction: IDC grade 3, 2.5 cm, with high-grade DCIS, ALH, LVI present, margins negative, 0/1 sentinel node negative, T2 N0 stage II a, ER 100%, PR 5%, HER-2 positive ratio 1.56 with average copy #8.25, Ki-67 20%   07/25/2015 - 11/03/2015 Chemotherapy   Adjuvant chemotherapy with TCH Perjeta 6 cycles followed by Herceptin maintenance for 1 year   11/28/2015 -  Anti-estrogen oral therapy   Adjuvant anastrozole 1 mg daily; switched to exemestane on 07/08/18, switched to letrozole     CHIEF COMPLIANT: Follow-up of left breast cancer on exemestane therapy     INTERVAL HISTORY: DOMINO HOLTEN is a 66 y.o. with above-mentioned history of left breast cancer treated with mastectomy, adjuvant chemotherapy, and is currently on letrozole therapy after she could not tolerate anastrozole. She presents to the clinic today for follow-up.      ALLERGIES:  is allergic to codeine, nsaids, lisinopril, bee pollen, pollen extract, and sulfamethoxazole.  MEDICATIONS:  Current Outpatient Medications  Medication Sig Dispense Refill   acetaminophen (TYLENOL) 325 MG tablet Take 650 mg by mouth every 4 (four) hours  as needed.      albuterol (VENTOLIN HFA) 108 (90 Base) MCG/ACT inhaler TAKE 2 PUFFS BY MOUTH EVERY 6 HOURS AS NEEDED FOR WHEEZE OR SHORTNESS OF BREATH 18 g 0   ALVESCO 80 MCG/ACT inhaler INHALE 1 PUFF INTO THE LUNGS TWICE DAILY 6.1 g 5   atorvastatin (LIPITOR) 10 MG tablet Take 1 tablet by mouth daily.     beclomethasone (QVAR REDIHALER) 80 MCG/ACT inhaler 2 puffs twice daily with spacer to prevent coughing or wheezing 1 each 5   Biotin 5 MG CAPS Take by mouth.     Carbinoxamine Maleate 4 MG TABS TAKE 1 TABLET (4 MG TOTAL) BY MOUTH 4 (FOUR) TIMES DAILY AS NEEDED. 120 tablet 0   clotrimazole (MYCELEX) 10 MG troche Take 10 mg by mouth 5 (five) times daily.     diphenhydrAMINE (SOMINEX) 25 MG tablet Take by mouth.     DULoxetine (CYMBALTA) 60 MG capsule Take 1 capsule (60 mg total) by mouth daily. 90 capsule 3   esomeprazole (NEXIUM) 20 MG capsule Take 1 capsule (20 mg total) by mouth daily. 90 capsule 0   famotidine (PEPCID) 20 MG tablet Take 1 tablet (20 mg total) by mouth 2 (two) times daily. 60 tablet 5   ferrous sulfate 325 (65 FE) MG EC tablet Take by mouth.     fluconazole (DIFLUCAN) 100 MG tablet Take by mouth.     Fluticasone Furoate (ARNUITY ELLIPTA) 100 MCG/ACT AEPB Inhale 2 puffs into the lungs 2 (two) times daily. 30 each 5   gabapentin (NEURONTIN) 800 MG tablet Take 1 tablet (800 mg total) by mouth 3 (three) times daily. (Patient taking differently: Take 800 mg by mouth 2 (two)  times daily.) 90 tablet 5   hydrOXYzine (ATARAX/VISTARIL) 25 MG tablet Take 25 mg by mouth at bedtime.     montelukast (SINGULAIR) 10 MG tablet TAKE 1 TABLET BY MOUTH EVERY NIGHT AT BEDTIME 90 tablet 4   Multiple Vitamin (MULTIVITAMIN) tablet Take 1 tablet by mouth daily.     NASAL SALINE NA Place into the nose daily. Nasal saline wash every morning      olmesartan-hydrochlorothiazide (BENICAR HCT) 20-12.5 MG tablet Take 1 tablet by mouth daily.     Omega-3 Fatty Acids (FISH OIL) 1200 MG CAPS Take 1,200 mg by  mouth daily.      ondansetron (ZOFRAN) 4 MG tablet Take 1 tablet (4 mg total) by mouth every 8 (eight) hours as needed for nausea or vomiting. 20 tablet 0   Polyethyl Glycol-Propyl Glycol (SYSTANE OP) Apply to eye. 2-3 drops daily     Probiotic Product (PROBIOTIC DAILY PO) Take by mouth.     tiZANidine (ZANAFLEX) 4 MG tablet Take 4 mg by mouth 3 (three) times daily.     traMADol (ULTRAM) 50 MG tablet PLEASE SEE ATTACHED FOR DETAILED DIRECTIONS     triamcinolone (NASACORT) 55 MCG/ACT AERO nasal inhaler Place 2 sprays into the nose daily. (Patient taking differently: Place 2 sprays into the nose as needed.) 1 Inhaler 5   valACYclovir (VALTREX) 1000 MG tablet Take 1,000 mg by mouth 2 (two) times daily.     vitamin B-12 (CYANOCOBALAMIN) 250 MCG tablet Take by mouth.     ZINC SULFATE PO Take by mouth.     No current facility-administered medications for this visit.    PHYSICAL EXAMINATION: ECOG PERFORMANCE STATUS: {CHL ONC ECOG PS:[phone]}  There were no vitals filed for this visit. There were no vitals filed for this visit.  BREAST:*** No palpable masses or nodules in either right or left breasts. No palpable axillary supraclavicular or infraclavicular adenopathy no breast tenderness or nipple discharge. (exam performed in the presence of a chaperone)  LABORATORY DATA:  I have reviewed the data as listed    Latest Ref Rng & Units 10/20/2020    1:59 PM 07/08/2018    8:55 AM 01/13/2018    9:57 AM  CMP  Glucose 70 - 99 mg/dL 135  84  110   BUN 8 - 23 mg/dL $Remove'24  18  20   'kZjYrcd$ Creatinine 0.44 - 1.00 mg/dL 1.23  1.24  1.21   Sodium 135 - 145 mmol/L 136  139  136   Potassium 3.5 - 5.1 mmol/L 3.7  3.1  4.4   Chloride 98 - 111 mmol/L 99  98  98   CO2 22 - 32 mmol/L $RemoveB'27  28  25   'xGJqPMTm$ Calcium 8.9 - 10.3 mg/dL 10.0  9.9  10.4   Total Protein 6.5 - 8.1 g/dL 7.7  7.9  7.0   Total Bilirubin 0.3 - 1.2 mg/dL 0.7  0.2  <0.2   Alkaline Phos 38 - 126 U/L 88  112  93   AST 15 - 41 U/L $Remo'27  19  18   'CqLjZ$ ALT 0 - 44  U/L $Re'25  20  19     'Yid$ Lab Results  Component Value Date   WBC 7.5 10/20/2020   HGB 10.5 (L) 10/20/2020   HCT 32.7 (L) 10/20/2020   MCV 83.8 10/20/2020   PLT 279 10/20/2020   NEUTROABS 4.7 10/20/2020    ASSESSMENT & PLAN:  No problem-specific Assessment & Plan notes found for this encounter.    No  orders of the defined types were placed in this encounter.  The patient has a good understanding of the overall plan. she agrees with it. she will call with any problems that may develop before the next visit here. Total time spent: 30 mins including face to face time and time spent for planning, charting and co-ordination of care   Suzzette Righter, [place] 10/10/21    I Gardiner Coins am scribing for Dr. Lindi Adie  ***

## 2021-10-11 ENCOUNTER — Other Ambulatory Visit: Payer: Self-pay | Admitting: *Deleted

## 2021-10-11 DIAGNOSIS — Z170 Estrogen receptor positive status [ER+]: Secondary | ICD-10-CM

## 2021-10-12 ENCOUNTER — Ambulatory Visit: Payer: Medicare Other

## 2021-10-12 ENCOUNTER — Other Ambulatory Visit: Payer: Self-pay

## 2021-10-12 ENCOUNTER — Inpatient Hospital Stay: Payer: Medicare Other | Attending: Hematology and Oncology

## 2021-10-12 ENCOUNTER — Inpatient Hospital Stay (HOSPITAL_BASED_OUTPATIENT_CLINIC_OR_DEPARTMENT_OTHER): Payer: Medicare Other | Admitting: Hematology and Oncology

## 2021-10-12 DIAGNOSIS — Z79811 Long term (current) use of aromatase inhibitors: Secondary | ICD-10-CM | POA: Diagnosis not present

## 2021-10-12 DIAGNOSIS — Z79899 Other long term (current) drug therapy: Secondary | ICD-10-CM | POA: Diagnosis not present

## 2021-10-12 DIAGNOSIS — C50212 Malignant neoplasm of upper-inner quadrant of left female breast: Secondary | ICD-10-CM | POA: Insufficient documentation

## 2021-10-12 DIAGNOSIS — Z170 Estrogen receptor positive status [ER+]: Secondary | ICD-10-CM | POA: Insufficient documentation

## 2021-10-12 DIAGNOSIS — Z79624 Long term (current) use of inhibitors of nucleotide synthesis: Secondary | ICD-10-CM | POA: Diagnosis not present

## 2021-10-12 LAB — CBC WITH DIFFERENTIAL (CANCER CENTER ONLY)
Abs Immature Granulocytes: 0.02 10*3/uL (ref 0.00–0.07)
Basophils Absolute: 0.1 10*3/uL (ref 0.0–0.1)
Basophils Relative: 1 %
Eosinophils Absolute: 0.2 10*3/uL (ref 0.0–0.5)
Eosinophils Relative: 2 %
HCT: 33.6 % — ABNORMAL LOW (ref 36.0–46.0)
Hemoglobin: 10.9 g/dL — ABNORMAL LOW (ref 12.0–15.0)
Immature Granulocytes: 0 %
Lymphocytes Relative: 29 %
Lymphs Abs: 1.9 10*3/uL (ref 0.7–4.0)
MCH: 28 pg (ref 26.0–34.0)
MCHC: 32.4 g/dL (ref 30.0–36.0)
MCV: 86.4 fL (ref 80.0–100.0)
Monocytes Absolute: 0.6 10*3/uL (ref 0.1–1.0)
Monocytes Relative: 9 %
Neutro Abs: 3.9 10*3/uL (ref 1.7–7.7)
Neutrophils Relative %: 59 %
Platelet Count: 267 10*3/uL (ref 150–400)
RBC: 3.89 MIL/uL (ref 3.87–5.11)
RDW: 13.5 % (ref 11.5–15.5)
WBC Count: 6.6 10*3/uL (ref 4.0–10.5)
nRBC: 0 % (ref 0.0–0.2)

## 2021-10-12 LAB — CMP (CANCER CENTER ONLY)
ALT: 15 U/L (ref 0–44)
AST: 15 U/L (ref 15–41)
Albumin: 4.5 g/dL (ref 3.5–5.0)
Alkaline Phosphatase: 80 U/L (ref 38–126)
Anion gap: 6 (ref 5–15)
BUN: 26 mg/dL — ABNORMAL HIGH (ref 8–23)
CO2: 25 mmol/L (ref 22–32)
Calcium: 9.9 mg/dL (ref 8.9–10.3)
Chloride: 104 mmol/L (ref 98–111)
Creatinine: 1.22 mg/dL — ABNORMAL HIGH (ref 0.44–1.00)
GFR, Estimated: 49 mL/min — ABNORMAL LOW (ref >60)
Glucose, Bld: 100 mg/dL — ABNORMAL HIGH (ref 70–99)
Potassium: 4.9 mmol/L (ref 3.5–5.1)
Sodium: 135 mmol/L (ref 135–145)
Total Bilirubin: 0.2 mg/dL — ABNORMAL LOW (ref 0.3–1.2)
Total Protein: 7.7 g/dL (ref 6.5–8.1)

## 2021-10-12 NOTE — Assessment & Plan Note (Signed)
Left simple mastectomy with reconstruction 06/24/2015: IDC grade 3, 2.5 cm, with high-grade DCIS, ALH, LVI present, margins negative, 0/1 sentinel node negative, , ER 100%, PR 5%, HER-2 positive ratio 1.56 with average copy #8.25, Ki-67 20%  Pathologic stage:T2 N0 stage II a Treatment plan: 1. Adjuvant chemotherapy with TCHP X 6 completed 10/30/17followed by Herceptin maintenance for 1 year which completed 08/07/2016 2. Followed by adjuvant hormonal therapy with anastrozole started 11/28/2015 ---------------------------------------------------------------------------------------------------------- Current treatment:  Echocardiogram 05/22/16 LVEF 60-65% Anastrozole started 11/20/17switched to letrozole6/30/2020  Letrozoletoxicities: 1. Memory problems 2. Body aches and pains  Bone density test 04/12/2016: T score -2.2. Osteopenia. On Fosamax  Right ankle fracture: Patient was on an 37 state RV trip and fell and broke her ankle in a couple of places.  She finished the drug and then had surgery in September.  She is still wearing a boot. She was told that the bones were extremely soft.  I recommended switching her from Fosamax to Prolia. COVID infection Feb 2021  Breast cancer surveillance:  Right breast mamm[facility]7/14/2023[facility] at [facility] breast density category C Breast exam 10/12/2021: Benign  We will schedule Prolia injections every 6 months with labs.  Anxiety: On antianxiety medications   I will see her back in1 yearfor follow-up.

## 2021-11-20 ENCOUNTER — Inpatient Hospital Stay: Payer: Medicare Other | Attending: Hematology and Oncology

## 2021-11-20 ENCOUNTER — Other Ambulatory Visit: Payer: Self-pay

## 2021-11-20 VITALS — BP 162/80 | HR 88 | Temp 98.1°F | Resp 18

## 2021-11-20 DIAGNOSIS — M8580 Other specified disorders of bone density and structure, unspecified site: Secondary | ICD-10-CM | POA: Diagnosis present

## 2021-11-20 DIAGNOSIS — C50212 Malignant neoplasm of upper-inner quadrant of left female breast: Secondary | ICD-10-CM | POA: Diagnosis present

## 2021-11-20 DIAGNOSIS — M810 Age-related osteoporosis without current pathological fracture: Secondary | ICD-10-CM

## 2021-11-20 MED ORDER — ZOLEDRONIC ACID 4 MG/100ML IV SOLN
4.0000 mg | Freq: Once | INTRAVENOUS | Status: AC
  Administered 2021-11-20: 4 mg via INTRAVENOUS
  Filled 2021-11-20: qty 100

## 2021-11-20 MED ORDER — SODIUM CHLORIDE 0.9 % IV SOLN: Freq: Once | INTRAVENOUS | Status: AC

## 2021-11-20 NOTE — Patient Instructions (Signed)

## 2022-01-09 ENCOUNTER — Ambulatory Visit: Payer: Medicare Other | Admitting: Family Medicine

## 2022-01-09 ENCOUNTER — Encounter: Payer: Self-pay | Admitting: Family Medicine

## 2022-01-09 VITALS — BP 118/70 | HR 76 | Temp 98.2°F | Resp 18 | Ht 62.0 in | Wt 183.1 lb

## 2022-01-09 DIAGNOSIS — J3089 Other allergic rhinitis: Secondary | ICD-10-CM

## 2022-01-09 DIAGNOSIS — H1013 Acute atopic conjunctivitis, bilateral: Secondary | ICD-10-CM

## 2022-01-09 DIAGNOSIS — J4520 Mild intermittent asthma, uncomplicated: Secondary | ICD-10-CM | POA: Diagnosis not present

## 2022-01-09 DIAGNOSIS — K219 Gastro-esophageal reflux disease without esophagitis: Secondary | ICD-10-CM

## 2022-01-09 DIAGNOSIS — H1010 Acute atopic conjunctivitis, unspecified eye: Secondary | ICD-10-CM

## 2022-01-09 MED ORDER — CARBINOXAMINE MALEATE 4 MG PO TABS: 4.0000 mg | ORAL_TABLET | Freq: Four times a day (QID) | ORAL | 5 refills | Status: AC | PRN

## 2022-01-09 MED ORDER — BENZONATATE 100 MG PO CAPS: 100.0000 mg | ORAL_CAPSULE | Freq: Three times a day (TID) | ORAL | 0 refills | Status: DC | PRN

## 2022-01-09 NOTE — Progress Notes (Signed)
[place] [address] Dept: [phone]  FOLLOW UP NOTE  Patient ID: Heidi Walls, female    DOB: [birth year]  Age: 67 y.o. MRN: [id number] Date of Office Visit: 01/09/2022  Assessment  Chief Complaint: Follow-up (Allergic rhinitis/)  HPI Heidi Walls is a 67 year old female who presents to the clinic for follow-up visit.  She was last seen in this clinic on 06/30/2021 by Gareth Morgan, FNP, for evaluation of asthma, allergic rhinitis, allergic conjunctivitis, and reflux.  At today's visit, she reports her asthma has been moderately well-controlled with occasional dry cough and infrequent wheeze.  She reports that she has not used her albuterol or Qvar inhaler since her last visit to this clinic.  She is looking forward to an upcoming cruise and is requesting Tessalon Perles as needed for cough while on the cruise.  Allergic rhinitis is reported as moderately well-controlled with symptoms including occasional nasal congestion and headache that occurred last night and has resolved at this time.  She occasionally experiences itch in bilateral ears.  She continues carbinoxamine once or twice a day, Nasacort and azelastine as needed, and has recently started using nasal saline rinses.  Her last environmental allergy testing was on 12/12/2016 and was positive to dust mites and molds.  Allergic conjunctivitis is reported as well-controlled with occasional use of Systane eyedrops.  She is not currently using and allergy eyedrop.  Reflux is reported as well-controlled with no symptoms including heartburn or vomiting.  She continues famotidine and esomeprazole daily.  She reports that she occasionally experiences a red, itchy rash that occurs on the right side of her nose and occasionally occurs on her tongue for which she takes Valtrex with relief of symptoms.  Her current medications are listed in the chart.   Drug Allergies:  Allergies  Allergen Reactions   Codeine Shortness Of Breath     Pt reports this was 30 years ago.    Nsaids    Lisinopril Cough   Bee Pollen Other (See Comments)   Pollen Extract    Sulfamethoxazole     Physical Exam: BP 118/70 (BP Location: Right Arm, Patient Position: Sitting, Cuff Size: Normal)   Pulse 76   Temp 98.2 F (36.8 C) (Temporal)   Resp 18   Ht '5\' 2"'$  (1.575 m)   Wt 183 lb 1.6 oz (83.1 kg)   SpO2 100%   BMI 33.49 kg/m    Physical Exam Vitals reviewed.  Constitutional:      Appearance: Normal appearance.  HENT:     Head: Normocephalic and atraumatic.     Right Ear: Tympanic membrane normal.     Left Ear: Tympanic membrane normal.     Nose:     Comments: Bilateral nares slightly erythematous with clear nasal drainage.  Pharynx slightly erythematous with no exudate.  Tongue remains intact with no blisters.  Ears normal.  Eyes normal.    Mouth/Throat:     Pharynx: Oropharynx is clear.  Eyes:     Conjunctiva/sclera: Conjunctivae normal.  Cardiovascular:     Rate and Rhythm: Normal rate and regular rhythm.     Heart sounds: Normal heart sounds. No murmur heard. Pulmonary:     Effort: Pulmonary effort is normal.     Breath sounds: Normal breath sounds.     Comments: Lungs clear to auscultation Musculoskeletal:        General: Normal range of motion.     Cervical back: Normal range of motion and neck supple.  Skin:    General: Skin is warm and dry.  Neurological:     Mental Status: She is alert and oriented to person, place, and time.  Psychiatric:        Mood and Affect: Mood normal.        Behavior: Behavior normal.        Thought Content: Thought content normal.        Judgment: Judgment normal.     Diagnostics: FVC 2.32, FEV1 1.70.  Predicted FVC 2.77, predicted FEV1 2.17.  Spirometry indicates normal ventilatory function.  Assessment and Plan: 1. Mild intermittent asthma without complication   2. Seasonal allergic conjunctivitis   3. Perinnial allergic rhinitis with a probable non-allergic component   4.  Gastroesophageal reflux disease, unspecified whether esophagitis present     Meds ordered this encounter  Medications   benzonatate (TESSALON PERLES) 100 MG capsule    Sig: Take 1 capsule (100 mg total) by mouth 3 (three) times daily as needed for cough.    Dispense:  20 capsule    Refill:  0   Carbinoxamine Maleate 4 MG TABS    Sig: Take 1 tablet (4 mg total) by mouth 4 (four) times daily as needed.    Dispense:  120 tablet    Refill:  5    Patient Instructions  Asthma Continue albuterol 2 puffs every 4 hours as needed for cough or wheeze You may use albuterol 2 puffs 5-15 minutes before activity to decrease cough or wheeze For asthma flare, begin Qvar 80-2 puffs twice a day for 2 weeks or until cough and wheeze free Begin Tessalon Perles 100 mg up to three times a day as needed for cough  Allergic rhinitis Continue Nasacort 1-2 sprays in each nostril once a day as needed for a stuffy nose Continue carbinoxamine 8 mg in the morning and 8 mg in the evening as needed for a runny nose or itch Continue azelastine 1-2 sprays in each nostril twice a day as needed for a runny nose or sneezing Continue nasal saline rinses as needed for nasal symptoms Continue allergen avoidance measures directed toward mold and dust mites as listed below  Allergic conjunctivitis Some over the counter eye drops include Pataday one drop in each eye once a day as needed for red, itchy eyes OR Zaditor one drop in each eye twice a day as needed for red itchy eyes.  Reflux Continue famotidine 20 mg twice a day for reflux Continue esomeprazole once a day as previously prescribed Continue dietary and lifestyle modifications as listed below  Call the clinic if this treatment plan is not working well for you, if you develop a fever, your symptoms worsen, or do not improve  Follow up in 6 months or sooner if needed.   Return in about 6 months (around 07/10/2022), or if symptoms worsen or fail to improve.     Thank you for the opportunity to care for this patient.  Please do not hesitate to contact me with questions.  Gareth Morgan, FNP [facility] [employer]

## 2022-01-09 NOTE — Patient Instructions (Addendum)
Asthma Continue albuterol 2 puffs every 4 hours as needed for cough or wheeze You may use albuterol 2 puffs 5-15 minutes before activity to decrease cough or wheeze For asthma flare, begin Qvar 80-2 puffs twice a day for 2 weeks or until cough and wheeze free Begin Tessalon Perles 100 mg up to three times a day as needed for cough  Allergic rhinitis Continue Nasacort 1-2 sprays in each nostril once a day as needed for a stuffy nose Continue carbinoxamine 8 mg in the morning and 8 mg in the evening as needed for a runny nose or itch Continue azelastine 1-2 sprays in each nostril twice a day as needed for a runny nose or sneezing Continue nasal saline rinses as needed for nasal symptoms Continue allergen avoidance measures directed toward mold and dust mites as listed below  Allergic conjunctivitis Some over the counter eye drops include Pataday one drop in each eye once a day as needed for red, itchy eyes OR Zaditor one drop in each eye twice a day as needed for red itchy eyes.  Reflux Continue famotidine 20 mg twice a day for reflux Continue esomeprazole once a day as previously prescribed Continue dietary and lifestyle modifications as listed below  Call the clinic if this treatment plan is not working well for you, if you develop a fever, your symptoms worsen, or do not improve  Follow up in 6 months or sooner if needed.  Lifestyle Changes for Controlling GERD When you have GERD, stomach acid feels as if it's backing up toward your mouth. Whether or not you take medication to control your GERD, your symptoms can often be improved with lifestyle changes.   Raise Your Head Reflux is more likely to strike when you're lying down flat, because stomach fluid can flow backward more easily. Raising the head of your bed 4-6 inches can help. To do this: Slide blocks or books under the legs at the head of your bed. Or, place a wedge under the mattress. Many foam stores can make a suitable  wedge for you. The wedge should run from your waist to the top of your head. Don't just prop your head on several pillows. This increases pressure on your stomach. It can make GERD worse.  Watch Your Eating Habits Certain foods may increase the acid in your stomach or relax the lower esophageal sphincter, making GERD more likely. It's best to avoid the following: Coffee, tea, and carbonated drinks (with and without caffeine) Fatty, fried, or spicy food Mint, chocolate, onions, and tomatoes Any other foods that seem to irritate your stomach or cause you pain  Relieve the Pressure Eat smaller meals, even if you have to eat more often. Don't lie down right after you eat. Wait a few hours for your stomach to empty. Avoid tight belts and tight-fitting clothes. Lose excess weight.  Tobacco and Alcohol Avoid smoking tobacco and drinking alcohol. They can make GERD symptoms worse.  Control of Mold Allergen Mold and fungi can grow on a variety of surfaces provided certain temperature and moisture conditions exist.  Outdoor molds grow on plants, decaying vegetation and soil.  The major outdoor mold, Alternaria and Cladosporium, are found in very high numbers during hot and dry conditions.  Generally, a late Summer - Fall peak is seen for common outdoor fungal spores.  Rain will temporarily lower outdoor mold spore count, but counts rise rapidly when the rainy period ends.  The most important indoor molds are Aspergillus and Penicillium.  Dark, humid and poorly ventilated basements are ideal sites for mold growth.  The next most common sites of mold growth are the bathroom and the kitchen.  Outdoor [employer] Use air conditioning and keep windows closed Avoid exposure to decaying vegetation. Avoid leaf raking. Avoid grain handling. Consider wearing a face mask if working in moldy areas.  Indoor Mold Control Maintain humidity below 50%. Clean washable surfaces with 5% bleach solution. Remove  sources e.g. Contaminated carpets.   Control of Dust Mite Allergen Dust mites play a major role in allergic asthma and rhinitis. They occur in environments with high humidity wherever human skin is found. Dust mites absorb humidity from the atmosphere (ie, they do not drink) and feed on organic matter (including shed human and animal skin). Dust mites are a microscopic type of insect that you cannot see with the naked eye. High levels of dust mites have been detected from mattresses, pillows, carpets, upholstered furniture, bed covers, clothes, soft toys and any woven material. The principal allergen of the dust mite is found in its feces. A gram of dust may contain 1,000 mites and 250,000 fecal particles. Mite antigen is easily measured in the air during house cleaning activities. Dust mites do not bite and do not cause harm to humans, other than by triggering allergies/asthma.  Ways to decrease your exposure to dust mites in your home:  1. Encase mattresses, box springs and pillows with a mite-impermeable barrier or cover  2. Wash sheets, blankets and drapes weekly in hot water (130 F) with detergent and dry them in a dryer on the hot setting.  3. Have the room cleaned frequently with a vacuum cleaner and a damp dust-mop. For carpeting or rugs, vacuuming with a vacuum cleaner equipped with a high-efficiency particulate air (HEPA) filter. The dust mite allergic individual should not be in a room which is being cleaned and should wait 1 hour after cleaning before going into the room.  4. Do not sleep on upholstered furniture (eg, couches).  5. If possible removing carpeting, upholstered furniture and drapery from the home is ideal. Horizontal blinds should be eliminated in the rooms where the person spends the most time (bedroom, study, television room). Washable vinyl, roller-type shades are optimal.  6. Remove all non-washable stuffed toys from the bedroom. Wash stuffed toys weekly like sheets  and blankets above.  7. Reduce indoor humidity to less than 50%. Inexpensive humidity monitors can be purchased at most hardware stores. Do not use a humidifier as can make the problem worse and are not recommended.

## 2022-02-24 DIAGNOSIS — L814 Other melanin hyperpigmentation: Secondary | ICD-10-CM | POA: Insufficient documentation

## 2022-02-24 DIAGNOSIS — Z85828 Personal history of other malignant neoplasm of skin: Secondary | ICD-10-CM | POA: Insufficient documentation

## 2022-07-10 ENCOUNTER — Ambulatory Visit: Payer: Medicare Other | Admitting: Family Medicine

## 2022-07-10 ENCOUNTER — Encounter: Payer: Self-pay | Admitting: Internal Medicine

## 2022-07-10 ENCOUNTER — Ambulatory Visit: Payer: Medicare Other | Admitting: Internal Medicine

## 2022-07-10 VITALS — BP 134/74 | HR 97 | Temp 97.8°F | Resp 18 | Ht 62.5 in | Wt 180.3 lb

## 2022-07-10 DIAGNOSIS — H1013 Acute atopic conjunctivitis, bilateral: Secondary | ICD-10-CM | POA: Diagnosis not present

## 2022-07-10 DIAGNOSIS — J3089 Other allergic rhinitis: Secondary | ICD-10-CM | POA: Diagnosis not present

## 2022-07-10 DIAGNOSIS — J4520 Mild intermittent asthma, uncomplicated: Secondary | ICD-10-CM | POA: Diagnosis not present

## 2022-07-10 DIAGNOSIS — K219 Gastro-esophageal reflux disease without esophagitis: Secondary | ICD-10-CM | POA: Diagnosis not present

## 2022-07-10 MED ORDER — ALVESCO 80 MCG/ACT IN AERS: INHALATION_SPRAY | RESPIRATORY_TRACT | 5 refills | Status: DC

## 2022-07-10 MED ORDER — ALBUTEROL SULFATE HFA 108 (90 BASE) MCG/ACT IN AERS: INHALATION_SPRAY | RESPIRATORY_TRACT | 2 refills | Status: DC

## 2022-07-10 NOTE — Progress Notes (Signed)
Follow Up Note  RE: Heidi Walls MRN: [id number] DOB: [birth year] Date of Office Visit: 07/10/2022  Referring provider: Melvenia Beam,* Primary care provider: Melvenia Beam, MD  Chief Complaint: Follow-up (Pt states her allergies have been bothering her a lot and its been effecting her breathing a little.)  History of Present Illness: I had the pleasure of seeing Heidi Walls for a follow up visit at the [facility] on 07/16/2022. She is a 67 y.o. female, who is being followed for asthma, allergic rhinoconjunctivitis, reflux. Her previous allergy office visit was on 01/09/22 with Thermon Leyland, FNP. Today is a regular follow up visit.  History obtained from patient, chart review.  Still complaining of chronic cough.  Feels like she did better on Alvesco then Qvar.  This is the only inhaler that really helped control her cough.  Her rhinitis has been at baseline.  She does have some breakthrough symptoms and is compliant carbinoxamine, Atrovent, Astelin, Nasacort.  Has not had any ocular symptoms.  GERD is controlled with famotidine 20 mg twice a day and as omeprazole daily.  She will breakthrough symptoms if she misses her meds.  Denies any antibiotics or steroids since last visit.  Assessment and Plan: Heidi Walls is a 67 y.o. female with: Mild intermittent asthma without complication - Plan: Spirometry with Graph  Other allergic rhinitis  Allergic conjunctivitis of both eyes  Gastroesophageal reflux disease, unspecified whether esophagitis present   Plan: Patient Instructions  Asthma Breathing test looked great!  Continue albuterol 2 puffs every 4 hours as needed for cough or wheeze You may use albuterol 2 puffs 5-15 minutes before activity to decrease cough or wheeze Alvesco  80-2 puffs twice a day for 2 weeks or until cough and wheeze free  Allergic rhinitis Continue Nasacort 1-2 sprays in each nostril once a day as needed for a stuffy  nose Continue carbinoxamine 8 mg in the morning and 8 mg in the evening as needed for a runny nose or itch Continue azelastine 1-2 sprays in each nostril twice a day as needed for a runny nose or sneezing Continue nasal saline rinses as needed for nasal symptoms Continue allergen avoidance measures directed toward mold and dust mites as listed below  Allergic conjunctivitis Some over the counter eye drops include Pataday one drop in each eye once a day as needed for red, itchy eyes OR Zaditor one drop in each eye twice a day as needed for red itchy eyes.  Reflux Continue famotidine 20 mg twice a day for reflux Continue esomeprazole once a day as previously prescribed Continue dietary and lifestyle modifications as listed below  Call the clinic if this treatment plan is not working well for you, if you develop a fever, your symptoms worsen, or do not improve  Follow up in 6 months or sooner if needed.    Meds ordered this encounter  Medications   DISCONTD: albuterol (VENTOLIN HFA) 108 (90 Base) MCG/ACT inhaler    Sig: TAKE 2 PUFFS BY MOUTH EVERY 6 HOURS AS NEEDED FOR WHEEZE OR SHORTNESS OF BREATH    Dispense:  18 g    Refill:  2    This is a courtesy refill. Patient needs an OV for further refills.   DISCONTD: ciclesonide (ALVESCO) 80 MCG/ACT inhaler    Sig: INHALE 1 PUFF INTO THE LUNGS TWICE DAILY    Dispense:  6.1 g    Refill:  5   ciclesonide (ALVESCO) 80 MCG/ACT inhaler  Sig: INHALE 1 PUFF INTO THE LUNGS TWICE DAILY    Dispense:  6.1 g    Refill:  5   albuterol (VENTOLIN HFA) 108 (90 Base) MCG/ACT inhaler    Sig: TAKE 2 PUFFS BY MOUTH EVERY 6 HOURS AS NEEDED FOR WHEEZE OR SHORTNESS OF BREATH    Dispense:  18 g    Refill:  2    This is a courtesy refill. Patient needs an OV for further refills.    Lab Orders  No laboratory test(s) ordered today   Diagnostics: Spirometry:  Tracings reviewed. Her effort: Good reproducible efforts. FVC: 2.38L FEV1: 1.84L, 85%  predicted FEV1/FVC ratio: 77% Interpretation: Spirometry consistent with normal pattern.  Please see scanned spirometry results for details.   Medication List:  Current Outpatient Medications  Medication Sig Dispense Refill   acetaminophen (TYLENOL) 325 MG tablet Take 650 mg by mouth every 4 (four) hours as needed.      atorvastatin (LIPITOR) 10 MG tablet Take 1 tablet by mouth daily.     beclomethasone (QVAR REDIHALER) 80 MCG/ACT inhaler 2 puffs twice daily with spacer to prevent coughing or wheezing 1 each 5   benzonatate (TESSALON PERLES) 100 MG capsule Take 1 capsule (100 mg total) by mouth 3 (three) times daily as needed for cough. 20 capsule 0   Biotin 5 MG CAPS Take by mouth.     Carbinoxamine Maleate 4 MG TABS Take 1 tablet (4 mg total) by mouth 4 (four) times daily as needed. 120 tablet 5   clotrimazole (MYCELEX) 10 MG troche Take 10 mg by mouth 5 (five) times daily.     diphenhydrAMINE (SOMINEX) 25 MG tablet Take by mouth.     DULoxetine (CYMBALTA) 60 MG capsule Take 1 capsule (60 mg total) by mouth daily. 90 capsule 3   esomeprazole (NEXIUM) 20 MG capsule Take 1 capsule (20 mg total) by mouth daily. 90 capsule 0   famotidine (PEPCID) 20 MG tablet Take 1 tablet (20 mg total) by mouth 2 (two) times daily. 60 tablet 5   ferrous sulfate 325 (65 FE) MG EC tablet Take by mouth.     fluconazole (DIFLUCAN) 100 MG tablet Take by mouth.     gabapentin (NEURONTIN) 800 MG tablet Take 1 tablet (800 mg total) by mouth 3 (three) times daily. (Patient taking differently: Take 800 mg by mouth 2 (two) times daily.) 90 tablet 5   hydrOXYzine (ATARAX/VISTARIL) 25 MG tablet Take 25 mg by mouth at bedtime.     montelukast (SINGULAIR) 10 MG tablet TAKE 1 TABLET BY MOUTH EVERY NIGHT AT BEDTIME 90 tablet 4   Multiple Vitamin (MULTIVITAMIN) tablet Take 1 tablet by mouth daily.     NASAL SALINE NA Place into the nose daily. Nasal saline wash every morning      olmesartan-hydrochlorothiazide (BENICAR HCT)  20-12.5 MG tablet Take 1 tablet by mouth daily.     Omega-3 Fatty Acids (FISH OIL) 1200 MG CAPS Take 1,200 mg by mouth daily.      ondansetron (ZOFRAN) 4 MG tablet Take 1 tablet (4 mg total) by mouth every 8 (eight) hours as needed for nausea or vomiting. 20 tablet 0   Polyethyl Glycol-Propyl Glycol (SYSTANE OP) Apply to eye. 2-3 drops daily     Probiotic Product (PROBIOTIC DAILY PO) Take by mouth.     tiZANidine (ZANAFLEX) 4 MG tablet Take 4 mg by mouth 3 (three) times daily.     traMADol (ULTRAM) 50 MG tablet PLEASE SEE ATTACHED FOR DETAILED DIRECTIONS  triamcinolone (NASACORT) 55 MCG/ACT AERO nasal inhaler Place 2 sprays into the nose daily. (Patient taking differently: Place 2 sprays into the nose as needed.) 1 Inhaler 5   valACYclovir (VALTREX) 1000 MG tablet Take 1,000 mg by mouth 2 (two) times daily.     vitamin B-12 (CYANOCOBALAMIN) 250 MCG tablet Take by mouth.     ZINC SULFATE PO Take by mouth.     albuterol (VENTOLIN HFA) 108 (90 Base) MCG/ACT inhaler TAKE 2 PUFFS BY MOUTH EVERY 6 HOURS AS NEEDED FOR WHEEZE OR SHORTNESS OF BREATH 18 g 2   ciclesonide (ALVESCO) 80 MCG/ACT inhaler INHALE 1 PUFF INTO THE LUNGS TWICE DAILY 6.1 g 5   No current facility-administered medications for this visit.   Allergies: Allergies  Allergen Reactions   Codeine Shortness Of Breath    Pt reports this was 30 years ago.    Nsaids    Lisinopril Cough   Bee Pollen Other (See Comments)   Pollen Extract    Sulfamethoxazole    I reviewed her past medical history, social history, family history, and environmental history and no significant changes have been reported from her previous visit.  ROS: All others negative except as noted per HPI.   Objective: BP 134/74   Pulse 97   Temp 97.8 F (36.6 C) (Temporal)   Resp 18   Ht 5' 2.5" (1.588 m)   Wt 180 lb 4.8 oz (81.8 kg)   SpO2 97%   BMI 32.45 kg/m  Body mass index is 32.45 kg/m. General Appearance:  Alert, cooperative, no distress,  appears stated age  Head:  Normocephalic, without obvious abnormality, atraumatic  Eyes:  Conjunctiva clear, EOM's intact  Nose: Nares normal, normal mucosa, no visible anterior polyps, and septum midline  Throat: Lips, tongue normal; teeth and gums normal, normal posterior oropharynx  Neck: Supple, symmetrical  Lungs:   clear to auscultation bilaterally, Respirations unlabored, intermittent dry coughing  Heart:  regular rate and rhythm and no murmur, Appears well perfused  Extremities: No edema  Skin: Skin color, texture, turgor normal, no rashes or lesions on visualized portions of skin  Neurologic: No gross deficits   Previous notes and tests were reviewed. The plan was reviewed with the patient/family, and all questions/concerned were addressed.  It was my pleasure to see Heidi Walls today and participate in her care. Please feel free to contact me with any questions or concerns.  Sincerely,  Ferol Luz, MD  Allergy & Immunology  [facility] Office: [phone]

## 2022-07-10 NOTE — Patient Instructions (Addendum)
Asthma Breathing test looked great!  Continue albuterol 2 puffs every 4 hours as needed for cough or wheeze You may use albuterol 2 puffs 5-15 minutes before activity to decrease cough or wheeze Alvesco  80-2 puffs twice a day for 2 weeks or until cough and wheeze free  Allergic rhinitis Continue Nasacort 1-2 sprays in each nostril once a day as needed for a stuffy nose Continue carbinoxamine 8 mg in the morning and 8 mg in the evening as needed for a runny nose or itch Continue azelastine 1-2 sprays in each nostril twice a day as needed for a runny nose or sneezing Continue nasal saline rinses as needed for nasal symptoms Continue allergen avoidance measures directed toward mold and dust mites as listed below  Allergic conjunctivitis Some over the counter eye drops include Pataday one drop in each eye once a day as needed for red, itchy eyes OR Zaditor one drop in each eye twice a day as needed for red itchy eyes.  Reflux Continue famotidine 20 mg twice a day for reflux Continue esomeprazole once a day as previously prescribed Continue dietary and lifestyle modifications as listed below  Call the clinic if this treatment plan is not working well for you, if you develop a fever, your symptoms worsen, or do not improve  Follow up in 6 months or sooner if needed.

## 2022-07-11 ENCOUNTER — Telehealth: Payer: Self-pay

## 2022-07-11 ENCOUNTER — Other Ambulatory Visit (HOSPITAL_COMMUNITY): Payer: Self-pay

## 2022-07-11 ENCOUNTER — Encounter: Payer: Self-pay | Admitting: Hematology and Oncology

## 2022-07-11 NOTE — Telephone Encounter (Signed)
Patient Advocate Encounter   Received notification from [employer] Part D that prior authorization is required for Alvesco 80MCG/ACT aerosol   Submitted: 07-11-2022 Key [id number]  Status is pending

## 2022-07-13 NOTE — Telephone Encounter (Signed)
Patient Advocate Encounter  Prior Authorization for Alvesco 80MCG/ACT aerosol has been approved through [employer] Part D.    Key: [id number]  Effective: 07-11-2022 to 01-08-2023

## 2022-07-16 MED ORDER — ALVESCO 80 MCG/ACT IN AERS: INHALATION_SPRAY | RESPIRATORY_TRACT | 5 refills | Status: DC

## 2022-07-16 MED ORDER — ALBUTEROL SULFATE HFA 108 (90 BASE) MCG/ACT IN AERS: INHALATION_SPRAY | RESPIRATORY_TRACT | 2 refills | Status: AC

## 2022-08-09 ENCOUNTER — Encounter: Payer: Self-pay | Admitting: Hematology and Oncology

## 2022-09-22 ENCOUNTER — Encounter: Payer: Self-pay | Admitting: Hematology and Oncology

## 2022-10-15 ENCOUNTER — Encounter: Payer: Self-pay | Admitting: Hematology and Oncology

## 2022-10-18 ENCOUNTER — Other Ambulatory Visit: Payer: Self-pay

## 2022-10-18 DIAGNOSIS — Z170 Estrogen receptor positive status [ER+]: Secondary | ICD-10-CM

## 2022-10-19 ENCOUNTER — Telehealth: Payer: Self-pay | Admitting: Adult Health

## 2022-10-19 NOTE — Telephone Encounter (Signed)
Rescheduled appointments per patients request. Patient is aware of the changes made to her upcoming appointments.

## 2022-10-22 ENCOUNTER — Inpatient Hospital Stay: Payer: Medicare HMO

## 2022-10-22 ENCOUNTER — Inpatient Hospital Stay: Payer: Medicare HMO | Admitting: Hematology and Oncology

## 2022-10-29 ENCOUNTER — Telehealth: Payer: Self-pay

## 2022-10-29 NOTE — Telephone Encounter (Signed)
Pts insurance doesn't cover [employer] prefers arnuity

## 2022-11-01 ENCOUNTER — Other Ambulatory Visit: Payer: Self-pay

## 2022-11-01 ENCOUNTER — Inpatient Hospital Stay: Payer: Medicare HMO | Admitting: Adult Health

## 2022-11-01 ENCOUNTER — Encounter: Payer: Self-pay | Admitting: Adult Health

## 2022-11-01 ENCOUNTER — Inpatient Hospital Stay: Payer: Medicare HMO

## 2022-11-01 ENCOUNTER — Inpatient Hospital Stay: Payer: Medicare HMO | Attending: Hematology and Oncology

## 2022-11-01 VITALS — BP 138/80 | HR 88

## 2022-11-01 VITALS — BP 153/80 | HR 98 | Temp 97.4°F | Resp 18 | Ht 62.5 in | Wt 177.2 lb

## 2022-11-01 DIAGNOSIS — Z170 Estrogen receptor positive status [ER+]: Secondary | ICD-10-CM | POA: Diagnosis not present

## 2022-11-01 DIAGNOSIS — F410 Panic disorder [episodic paroxysmal anxiety] without agoraphobia: Secondary | ICD-10-CM | POA: Insufficient documentation

## 2022-11-01 DIAGNOSIS — M810 Age-related osteoporosis without current pathological fracture: Secondary | ICD-10-CM

## 2022-11-01 DIAGNOSIS — M8580 Other specified disorders of bone density and structure, unspecified site: Secondary | ICD-10-CM | POA: Insufficient documentation

## 2022-11-01 DIAGNOSIS — C50212 Malignant neoplasm of upper-inner quadrant of left female breast: Secondary | ICD-10-CM | POA: Diagnosis present

## 2022-11-01 DIAGNOSIS — M4800 Spinal stenosis, site unspecified: Secondary | ICD-10-CM | POA: Insufficient documentation

## 2022-11-01 DIAGNOSIS — N2581 Secondary hyperparathyroidism of renal origin: Secondary | ICD-10-CM | POA: Insufficient documentation

## 2022-11-01 LAB — CMP (CANCER CENTER ONLY)
ALT: 22 U/L (ref 0–44)
AST: 21 U/L (ref 15–41)
Albumin: 4.6 g/dL (ref 3.5–5.0)
Alkaline Phosphatase: 94 U/L (ref 38–126)
Anion gap: 7 (ref 5–15)
BUN: 31 mg/dL — ABNORMAL HIGH (ref 8–23)
CO2: 27 mmol/L (ref 22–32)
Calcium: 10 mg/dL (ref 8.9–10.3)
Chloride: 103 mmol/L (ref 98–111)
Creatinine: 1.4 mg/dL — ABNORMAL HIGH (ref 0.44–1.00)
GFR, Estimated: 41 mL/min — ABNORMAL LOW (ref >60)
Glucose, Bld: 112 mg/dL — ABNORMAL HIGH (ref 70–99)
Potassium: 4.5 mmol/L (ref 3.5–5.1)
Sodium: 137 mmol/L (ref 135–145)
Total Bilirubin: 0.3 mg/dL (ref 0.3–1.2)
Total Protein: 7.7 g/dL (ref 6.5–8.1)

## 2022-11-01 LAB — CBC WITH DIFFERENTIAL (CANCER CENTER ONLY)
Abs Immature Granulocytes: 0.02 10*3/uL (ref 0.00–0.07)
Basophils Absolute: 0.1 10*3/uL (ref 0.0–0.1)
Basophils Relative: 1 %
Eosinophils Absolute: 0.2 10*3/uL (ref 0.0–0.5)
Eosinophils Relative: 2 %
HCT: 35.5 % — ABNORMAL LOW (ref 36.0–46.0)
Hemoglobin: 11.6 g/dL — ABNORMAL LOW (ref 12.0–15.0)
Immature Granulocytes: 0 %
Lymphocytes Relative: 22 %
Lymphs Abs: 1.7 10*3/uL (ref 0.7–4.0)
MCH: 29.9 pg (ref 26.0–34.0)
MCHC: 32.7 g/dL (ref 30.0–36.0)
MCV: 91.5 fL (ref 80.0–100.0)
Monocytes Absolute: 0.6 10*3/uL (ref 0.1–1.0)
Monocytes Relative: 8 %
Neutro Abs: 5 10*3/uL (ref 1.7–7.7)
Neutrophils Relative %: 67 %
Platelet Count: 257 10*3/uL (ref 150–400)
RBC: 3.88 MIL/uL (ref 3.87–5.11)
RDW: 13.2 % (ref 11.5–15.5)
WBC Count: 7.5 10*3/uL (ref 4.0–10.5)
nRBC: 0 % (ref 0.0–0.2)

## 2022-11-01 MED ORDER — ZOLEDRONIC ACID 4 MG/5ML IV CONC
3.0000 mg | Freq: Once | INTRAVENOUS | Status: AC
Start: 2022-11-01 — End: 2022-11-01
  Administered 2022-11-01: 3 mg via INTRAVENOUS
  Filled 2022-11-01: qty 3.75

## 2022-11-01 MED ORDER — SODIUM CHLORIDE 0.9 % IV SOLN: Freq: Once | INTRAVENOUS | Status: AC

## 2022-11-01 NOTE — Assessment & Plan Note (Signed)
Left simple mastectomy with reconstruction 06/24/2015: IDC grade 3, 2.5 cm, with high-grade DCIS, ALH, LVI present, margins negative, 0/1 sentinel node negative, , ER 100%, PR 5%, HER-2 positive ratio 1.56 with average copy #8.25, Ki-67 20%   Pathologic stage:T2 N0 stage II a Treatment plan: 1. Adjuvant chemotherapy with TCHP X 6 completed 11/07/15 followed by Herceptin maintenance for 1 year which completed 08/07/2016 2. Followed by adjuvant hormonal therapy with anastrozole started 11/28/2015 ---------------------------------------------------------------------------------------------------------- Current treatment:  Echocardiogram 05/22/16 LVEF 60-65% Anastrozole started 11/28/15 switched to letrozole 07/08/2018   Letrozole toxicities:  1. Memory problems 2. Body aches and pains    Bone density test 04/12/2016: T score -2.2. Osteopenia.  On Fosamax   Right ankle fracture: Patient was on an 19 state RV trip and fell and broke her ankle in a couple of places.  She finished the drug and then had surgery in September.  She is still wearing a boot. She was told that the bones were extremely soft.  I recommended switching her from Fosamax to Prolia. COVID infection Feb 2021   Breast cancer surveillance:  Right breast mammogram 08/03/2022 benign at [facility] breast density category B Breast exam 11/01/2022: Benign  Zometa at 3mg  (dose reduced due to GFR) annually for osteoprosis/osteopenia due to aromatase inhibitor therapy.  She is tolerating this well.  No dental concerns.   RTC in1 year for labs, f/u with Dr. Pamelia Hoit, Zometa.

## 2022-11-01 NOTE — Patient Instructions (Signed)

## 2022-11-01 NOTE — Progress Notes (Signed)
[facility] Cancer Follow up:    Heidi Beam, MD [address]  [address] [facility] [place] [address]   DIAGNOSIS:  Cancer Staging  Breast cancer of upper-inner quadrant of left female breast [facility]) Staging form: Breast, AJCC 7th Edition - Clinical stage from 06/08/2015: Stage IA (T1c, N0, M0) - Unsigned Staged by: Pathologist and managing physician Laterality: Left Estrogen receptor status: Positive Progesterone receptor status: Positive HER2 status: Positive Stage used in treatment planning: Yes National guidelines used in treatment planning: Yes Type of national guideline used in treatment planning: NCCN Staging comments: Staged at breast conference on 5.31.17 - Pathologic: Stage IIA (T2, N0, cM0) - Unsigned   SUMMARY OF ONCOLOGIC HISTORY: Oncology History  Breast cancer of upper-inner quadrant of left female breast (HCC)  05/26/2015 Initial Diagnosis   Screen detected left breast asymmetry (posterior 1.6 cm): Grade 2-3 IDC ER/PR positive HER-2 positive Ki-67 20% plus calcs (span 6.1 cm): High-grade DCIS with suspicious foci of invasion (4.2 cm apart)   06/24/2015 Surgery   Left simple mastectomy with reconstruction: IDC grade 3, 2.5 cm, with high-grade DCIS, ALH, LVI present, margins negative, 0/1 sentinel node negative, T2 N0 stage II a, ER 100%, PR 5%, HER-2 positive ratio 1.56 with average copy #8.25, Ki-67 20%   07/25/2015 - 11/03/2015 Chemotherapy   Adjuvant chemotherapy with TCH Perjeta 6 cycles followed by Herceptin maintenance for 1 year   11/28/2015 - 09/2020 Anti-estrogen oral therapy   Adjuvant anastrozole 1 mg daily; switched to exemestane on 07/08/18, switched to letrozole     CURRENT THERAPY: s/p AET with Exemestane, Zometa  INTERVAL HISTORY: Heidi Walls 67 y.o. female returns for f/u and evaluation of her history of breast cancer.  She is doing well today.  Her most recent right breast screening mammogram (s/p left mastectomy)  occurred on 08/03/2022 demonstrating no mammographic evidence of malignancy and breast density category B.   She is doing well today.  Her most recent bone density occurred 07/05/2022 demonstrating osteopenia with a t score at -2.4 in the left forearm.  She continues on Zometa annually with good tolerance.  She is active and sees her PCP regularly.  She denies any new health concerns.    Her husband is in the hospital and is scheduled for colectomy today.  She has been with him the past few weeks since he has been sick with diverticulitis and has not drank as much water as she normally would.     Patient Active Problem List   Diagnosis Date Noted   Secondary hyperparathyroidism (HCC) 11/01/2022   Panic disorder without agoraphobia 11/01/2022   Spinal stenosis 11/01/2022   Lentigo 02/24/2022   History of skin cancer 02/24/2022   Mild intermittent asthma without complication 01/09/2022   SVT (supraventricular tachycardia) (HCC) 09/25/2021   Chronic nonseasonal allergic rhinitis due to pollen 07/11/2021   Allergic conjunctivitis of both eyes 06/30/2021   Vitamin D deficiency 05/12/2021   Cervical radiculopathy 10/12/2019   Cubital tunnel syndrome on left 10/12/2019   Impingement syndrome of shoulder region, left 10/12/2019   Class 1 obesity due to excess calories with serious comorbidity and body mass index (BMI) of 31.0 to 31.9 in adult 08/21/2019   Stage 3a chronic kidney disease (HCC) 08/21/2019   Dysfunction of right eustachian tube 06/05/2019   Anxiety 05/21/2019   Essential hypertension 05/21/2019   Hoarseness 05/21/2019   Major depression, recurrent (HCC) 05/21/2019   Primary osteoarthritis involving multiple joints 05/21/2019   Acute non-recurrent  sinusitis 02/09/2019   Seasonal allergic conjunctivitis 02/09/2019   Oral candidiasis 09/11/2018   Bilateral hip pain 06/19/2018   Pain of both hip joints 06/19/2018   Chronic left-sided low back pain with left-sided sciatica  06/19/2018   Elevated cholesterol 01/15/2018   Arthritis of knee, left 07/02/2017   Arthritis of right knee 01/29/2017   Baker's cyst of knee, left 01/02/2017   Perennial allergic rhinitis with a probable nonallergic component 12/12/2016   Neuropathy 11/19/2016   Chronic pain 11/19/2016   Dysphonia 10/03/2016   Vocal cord atrophy 10/03/2016   Moderate persistent asthma without complication 07/10/2016   Bronchitis 07/10/2016   Panic attack 10/23/2015   Anemia 10/22/2015   Chemotherapy induced diarrhea 10/17/2015   Osteoporosis 09/07/2015   Generalized anxiety disorder 09/07/2015   Osteoarthritis of right knee 09/07/2015   Gastroesophageal reflux disease 09/07/2015   Depression 09/07/2015   Osteopenia of multiple sites 09/07/2015   Acquired absence of left breast and nipple 06/30/2015   Breast cancer of upper-inner quadrant of left female breast (HCC) 05/26/2015    is allergic to codeine, nsaids, lisinopril, bee pollen, pollen extract, and sulfamethoxazole.  MEDICAL HISTORY: Past Medical History:  Diagnosis Date   Anemia    Anxiety    OCD   Arthritis    knees and hips   Asthma    Breast cancer (HCC)    Breast cancer of upper-inner quadrant of left female breast (HCC) 05/26/2015   GERD (gastroesophageal reflux disease)    Hypertension    Insomnia     SURGICAL HISTORY: Past Surgical History:  Procedure Laterality Date   BREAST RECONSTRUCTION WITH PLACEMENT OF TISSUE EXPANDER AND FLEX HD (ACELLULAR HYDRATED DERMIS) Left 06/24/2015   Procedure: LEFT BREAST RECONSTRUCTION WITH PLACEMENT OF TISSUE EXPANDER AND  ACELLULAR HYDRATED DERMIS (CORTIVA);  Surgeon: Glenna Fellows, MD;  Location: [facility];  Service: Plastics;  Laterality: Left;   KNEE SURGERY Right 2000   arthroscopy   LIPOSUCTION WITH LIPOFILLING Left 12/23/2015   Procedure: LIPOFILLING TO LEFT CHEST FROM ABDOMEN  ;  Surgeon: Glenna Fellows, MD;  Location: [facility];  Service:  Plastics;  Laterality: Left;   MASTECTOMY W/ SENTINEL NODE BIOPSY Left 06/24/2015   Procedure: LEFT TOTAL MASTECTOMY WITH SENTINEL LYMPH NODE BIOPSY;  Surgeon: Glenna Fellows, MD;  Location: [facility];  Service: General;  Laterality: Left;   MASTOPEXY Right 12/23/2015   Procedure: RIGHT BREAST MASTOPEXY;  Surgeon: Glenna Fellows, MD;  Location: [facility];  Service: Plastics;  Laterality: Right;   MICROLARYNGOSCOPY W/VOCAL CORD INJECTION N/A 10/15/2016   Procedure: SUSPENDED DIRECT MICROLARYNGOSCOPY WITH VOCAL CORD INJECTION WITH JET VENTILATION;  Surgeon: Christia Reading, MD;  Location: [facility];  Service: ENT;  Laterality: N/A;   PORTACATH PLACEMENT Right 06/24/2015   Procedure: INSERTION PORT-A-CATH;  Surgeon: Glenna Fellows, MD;  Location: [facility];  Service: General;  Laterality: Right;   portacath removal  09/05/2016   REMOVAL OF BILATERAL TISSUE EXPANDERS WITH PLACEMENT OF BILATERAL BREAST IMPLANTS Left 12/23/2015   Procedure: REMOVAL OF LEFT  TISSUE EXPANDERS WITH PLACEMENT OF LEFT  BREAST IMPLANTS;  Surgeon: Glenna Fellows, MD;  Location: [facility];  Service: Plastics;  Laterality: Left;   TONSILLECTOMY      SOCIAL HISTORY: Social History   Socioeconomic History   Marital status: Married    Spouse name: Not on file   Number of children: Not on file   Years of education: Not on file  Highest education level: Not on file  Occupational History   Not on file  Tobacco Use   Smoking status: Former    Current packs/day: 0.00    Average packs/day: 1 pack/day for 25.0 years (25.0 ttl pk-yrs)    Types: Cigarettes    Start date: 10/09/1980    Quit date: 10/09/2005    Years since quitting: 17.0   Smokeless tobacco: Never  Vaping Use   Vaping status: Never Used  Substance and Sexual Activity   Alcohol use: No   Drug use: No   Sexual activity: Not on file  Other Topics Concern   Not on file  Social History  Narrative   Not on file   Social Determinants of Health   Financial Resource Strain: Not on file  Food Insecurity: Low Risk  (04/11/2022)   [facility]   Hunger Vital Sign    Within the past 12 months, you worried that your food would run out before you got money to buy more: Not on file    Ran Out of Food in the Last Year: Never true  Transportation Needs: Not on file (08/23/2022)  Physical Activity: Not on file  Stress: Not on file  Social Connections: Not on file  Intimate Partner Violence: Not on file    FAMILY HISTORY: Family History  Problem Relation Age of Onset   Heart failure Mother    Diabetes Mother    Hypertension Mother    Hypertension Father    Breast cancer Maternal Grandmother    Allergic rhinitis Sister    Sinusitis Sister    Allergic rhinitis Daughter    Allergic rhinitis Son    Sinusitis Son    Bronchitis Grandchild    Asthma Neg Hx    Eczema Neg Hx    Urticaria Neg Hx    Immunodeficiency Neg Hx    Angioedema Neg Hx     Review of Systems  Constitutional:  Negative for appetite change, chills, fatigue, fever and unexpected weight change.  HENT:   Negative for hearing loss, lump/mass and trouble swallowing.   Eyes:  Negative for eye problems and icterus.  Respiratory:  Negative for chest tightness, cough and shortness of breath.   Cardiovascular:  Negative for chest pain, leg swelling and palpitations.  Gastrointestinal:  Negative for abdominal distention, abdominal pain, constipation, diarrhea, nausea and vomiting.  Endocrine: Negative for hot flashes.  Genitourinary:  Negative for difficulty urinating.   Musculoskeletal:  Negative for arthralgias.  Skin:  Negative for itching and rash.  Neurological:  Negative for dizziness, extremity weakness, headaches and numbness.  Hematological:  Negative for adenopathy. Does not bruise/bleed easily.  Psychiatric/Behavioral:  Negative for depression. The patient is not  nervous/anxious.       PHYSICAL EXAMINATION    Vitals:   11/01/22 0839  BP: (!) 153/80  Pulse: 98  Resp: 18  Temp: (!) 97.4 F (36.3 C)  SpO2: 97%    Physical Exam Constitutional:      General: She is not in acute distress.    Appearance: Normal appearance. She is not toxic-appearing.  HENT:     Head: Normocephalic and atraumatic.     Mouth/Throat:     Mouth: Mucous membranes are moist.     Pharynx: Oropharynx is clear. No oropharyngeal exudate or posterior oropharyngeal erythema.  Eyes:     General: No scleral icterus. Cardiovascular:     Rate and Rhythm: Normal rate and regular rhythm.     Pulses:  Normal pulses.     Heart sounds: Normal heart sounds.  Pulmonary:     Effort: Pulmonary effort is normal.     Breath sounds: Normal breath sounds.  Chest:     Comments: Right breast benign, left breast s/p mastectomy and reconstruction, no sign of local recurrence Abdominal:     General: Abdomen is flat. Bowel sounds are normal. There is no distension.     Palpations: Abdomen is soft.     Tenderness: There is no abdominal tenderness.  Musculoskeletal:        General: No swelling.     Cervical back: Neck supple.  Lymphadenopathy:     Cervical: No cervical adenopathy.     Upper Body:     Right upper body: No axillary adenopathy.     Left upper body: No axillary adenopathy.  Skin:    General: Skin is warm and dry.     Findings: No rash.  Neurological:     General: No focal deficit present.     Mental Status: She is alert.  Psychiatric:        Mood and Affect: Mood normal.        Behavior: Behavior normal.     LABORATORY DATA:  CBC    Component Value Date/Time   WBC 7.5 11/01/2022 0830   WBC 8.6 10/09/2016 0900   RBC 3.88 11/01/2022 0830   HGB 11.6 (L) 11/01/2022 0830   HGB 11.0 (L) 10/23/2018 1214   HGB 10.1 (L) 08/07/2016 0844   HCT 35.5 (L) 11/01/2022 0830   HCT 34.1 10/23/2018 1214   HCT 31.2 (L) 08/07/2016 0844   PLT 257 11/01/2022 0830   PLT  369 10/23/2018 1214   MCV 91.5 11/01/2022 0830   MCV 85 10/23/2018 1214   MCV 88.1 08/07/2016 0844   MCH 29.9 11/01/2022 0830   MCHC 32.7 11/01/2022 0830   RDW 13.2 11/01/2022 0830   RDW 14.0 10/23/2018 1214   RDW 14.7 (H) 08/07/2016 0844   LYMPHSABS 1.7 11/01/2022 0830   LYMPHSABS 1.7 10/23/2018 1214   LYMPHSABS 1.5 08/07/2016 0844   MONOABS 0.6 11/01/2022 0830   MONOABS 0.5 08/07/2016 0844   EOSABS 0.2 11/01/2022 0830   EOSABS 0.1 10/23/2018 1214   BASOSABS 0.1 11/01/2022 0830   BASOSABS 0.0 10/23/2018 1214   BASOSABS 0.0 08/07/2016 0844    CMP     Component Value Date/Time   NA 137 11/01/2022 0830   NA 136 01/13/2018 0957   NA 137 08/07/2016 0845   K 4.5 11/01/2022 0830   K 4.4 08/07/2016 0845   CL 103 11/01/2022 0830   CO2 27 11/01/2022 0830   CO2 27 08/07/2016 0845   GLUCOSE 112 (H) 11/01/2022 0830   GLUCOSE 121 08/07/2016 0845   BUN 31 (H) 11/01/2022 0830   BUN 20 01/13/2018 0957   BUN 19.6 08/07/2016 0845   CREATININE 1.40 (H) 11/01/2022 0830   CREATININE 1.1 08/07/2016 0845   CALCIUM 10.0 11/01/2022 0830   CALCIUM 9.8 08/07/2016 0845   PROT 7.7 11/01/2022 0830   PROT 7.0 01/13/2018 0957   PROT 7.5 08/07/2016 0845   ALBUMIN 4.6 11/01/2022 0830   ALBUMIN 4.4 01/13/2018 0957   ALBUMIN 4.0 08/07/2016 0845   AST 21 11/01/2022 0830   AST 26 08/07/2016 0845   ALT 22 11/01/2022 0830   ALT 30 08/07/2016 0845   ALKPHOS 94 11/01/2022 0830   ALKPHOS 83 08/07/2016 0845   BILITOT 0.3 11/01/2022 0830   BILITOT 0.22 08/07/2016 0845  GFRNONAA 41 (L) 11/01/2022 0830   GFRAA 54 (L) 07/08/2018 0855      ASSESSMENT and THERAPY PLAN:   Breast cancer of upper-inner quadrant of left female breast (HCC) Left simple mastectomy with reconstruction 06/24/2015: IDC grade 3, 2.5 cm, with high-grade DCIS, ALH, LVI present, margins negative, 0/1 sentinel node negative, , ER 100%, PR 5%, HER-2 positive ratio 1.56 with average copy #8.25, Ki-67 20%   Pathologic stage:T2 N0  stage II a Treatment plan: 1. Adjuvant chemotherapy with TCHP X 6 completed 11/07/15 followed by Herceptin maintenance for 1 year which completed 08/07/2016 2. Followed by adjuvant hormonal therapy with anastrozole started 11/28/2015 ---------------------------------------------------------------------------------------------------------- Current treatment:  Echocardiogram 05/22/16 LVEF 60-65% Anastrozole started 11/28/15 switched to letrozole 07/08/2018   Letrozole toxicities:  1. Memory problems 2. Body aches and pains    Bone density test 04/12/2016: T score -2.2. Osteopenia.  On Fosamax   Right ankle fracture: Patient was on an 19 state RV trip and fell and broke her ankle in a couple of places.  She finished the drug and then had surgery in September.  She is still wearing a boot. She was told that the bones were extremely soft.  I recommended switching her from Fosamax to Prolia. COVID infection Feb 2021   Breast cancer surveillance:  Right breast mammogram 08/03/2022 benign at [facility] breast density category B Breast exam 11/01/2022: Benign  Zometa at 3mg  (dose reduced due to GFR) annually for osteoprosis/osteopenia due to aromatase inhibitor therapy.  She is tolerating this well.  No dental concerns.   RTC in1 year for labs, f/u with Dr. Pamelia Hoit, Zometa.      All questions were answered. The patient knows to call the clinic with any problems, questions or concerns. We can certainly see the patient much sooner if necessary.  Total encounter time:30 minutes*in face-to-face visit time, chart review, lab review, care coordination, order entry, and documentation of the encounter time.    Lillard Anes, NP 11/01/22 9:27 AM Medical Oncology and Hematology [facility] [address] [place], [place] [address] Tel. [phone]    Fax. [phone]  *Total Encounter Time as defined by the Centers for Medicare and Medicaid Services includes, in addition to the  face-to-face time of a patient visit (documented in the note above) non-face-to-face time: obtaining and reviewing outside history, ordering and reviewing medications, tests or procedures, care coordination (communications with other health care professionals or caregivers) and documentation in the medical record.

## 2022-11-07 ENCOUNTER — Other Ambulatory Visit (HOSPITAL_COMMUNITY): Payer: Self-pay

## 2022-11-07 ENCOUNTER — Encounter: Payer: Self-pay | Admitting: Hematology and Oncology

## 2022-11-07 ENCOUNTER — Telehealth: Payer: Self-pay

## 2022-11-07 MED ORDER — ALVESCO 80 MCG/ACT IN AERS: INHALATION_SPRAY | RESPIRATORY_TRACT | 5 refills | Status: AC

## 2022-11-07 NOTE — Telephone Encounter (Signed)
Pharmacy Patient Advocate Encounter   Received notification from Patient Pharmacy that prior authorization for Alvesco 80MCG/ACT aerosol is required/requested.   Insurance verification completed.   The patient is insured through [employer] .   Per test claim: PA required; PA submitted to above mentioned insurance via CoverMyMeds Key/confirmation #/EOC [id number] Status is pending

## 2022-11-07 NOTE — Telephone Encounter (Signed)
Pharmacy notified.

## 2022-11-07 NOTE — Telephone Encounter (Signed)
Pharmacy Patient Advocate Encounter  Received notification from [facility] that Prior Authorization for Alvesco 80MCG/ACT aerosol has been APPROVED from 11-07-2022 to 01-08-2024. Ran test claim, Copay is $95.00. This test claim was processed through [facility]- copay amounts may vary at other pharmacies due to pharmacy/plan contracts, or as the patient moves through the different stages of their insurance plan.   PA #/Case ID/Reference #: [id number]

## 2022-11-16 MED ORDER — ARNUITY ELLIPTA 100 MCG/ACT IN AEPB: 1.0000 | INHALATION_SPRAY | Freq: Two times a day (BID) | RESPIRATORY_TRACT | 5 refills | Status: AC

## 2022-11-16 NOTE — Addendum Note (Signed)
Addended by: Berna Bue on: 11/16/2022 10:16 AM   Modules accepted: Orders

## 2022-11-16 NOTE — Telephone Encounter (Signed)
We can switch her to arnuity 1 puff twice daily

## 2022-11-16 NOTE — Telephone Encounter (Signed)
SENT IN ARNUITY TO [facility]

## 2022-11-28 ENCOUNTER — Encounter: Payer: Self-pay | Admitting: Hematology and Oncology

## 2022-11-28 NOTE — Telephone Encounter (Signed)
Telephone call  

## 2022-12-04 ENCOUNTER — Other Ambulatory Visit: Payer: Self-pay | Admitting: Family Medicine

## 2023-01-11 ENCOUNTER — Other Ambulatory Visit: Payer: Self-pay | Admitting: Family Medicine

## 2023-10-28 ENCOUNTER — Encounter: Payer: Self-pay | Admitting: Hematology and Oncology

## 2023-11-04 ENCOUNTER — Inpatient Hospital Stay: Payer: Medicare (Managed Care) | Attending: Hematology and Oncology

## 2023-11-04 ENCOUNTER — Other Ambulatory Visit: Payer: Self-pay

## 2023-11-04 ENCOUNTER — Inpatient Hospital Stay: Payer: Medicare (Managed Care)

## 2023-11-04 ENCOUNTER — Inpatient Hospital Stay: Payer: Medicare (Managed Care) | Admitting: Hematology and Oncology

## 2023-11-04 VITALS — BP 155/78 | HR 84 | Temp 97.5°F | Resp 18 | Ht 63.0 in | Wt 164.9 lb

## 2023-11-04 DIAGNOSIS — M810 Age-related osteoporosis without current pathological fracture: Secondary | ICD-10-CM

## 2023-11-04 DIAGNOSIS — M8580 Other specified disorders of bone density and structure, unspecified site: Secondary | ICD-10-CM | POA: Insufficient documentation

## 2023-11-04 DIAGNOSIS — Z7983 Long term (current) use of bisphosphonates: Secondary | ICD-10-CM | POA: Insufficient documentation

## 2023-11-04 DIAGNOSIS — Z170 Estrogen receptor positive status [ER+]: Secondary | ICD-10-CM | POA: Insufficient documentation

## 2023-11-04 DIAGNOSIS — C50212 Malignant neoplasm of upper-inner quadrant of left female breast: Secondary | ICD-10-CM | POA: Diagnosis present

## 2023-11-04 LAB — CBC WITH DIFFERENTIAL (CANCER CENTER ONLY)
Abs Immature Granulocytes: 0.03 K/uL (ref 0.00–0.07)
Basophils Absolute: 0.1 K/uL (ref 0.0–0.1)
Basophils Relative: 1 %
Eosinophils Absolute: 0.1 K/uL (ref 0.0–0.5)
Eosinophils Relative: 2 %
HCT: 37.3 % (ref 36.0–46.0)
Hemoglobin: 12.1 g/dL (ref 12.0–15.0)
Immature Granulocytes: 0 %
Lymphocytes Relative: 26 %
Lymphs Abs: 2.4 K/uL (ref 0.7–4.0)
MCH: 27.6 pg (ref 26.0–34.0)
MCHC: 32.4 g/dL (ref 30.0–36.0)
MCV: 85.2 fL (ref 80.0–100.0)
Monocytes Absolute: 0.7 K/uL (ref 0.1–1.0)
Monocytes Relative: 7 %
Neutro Abs: 5.8 K/uL (ref 1.7–7.7)
Neutrophils Relative %: 64 %
Platelet Count: 289 K/uL (ref 150–400)
RBC: 4.38 MIL/uL (ref 3.87–5.11)
RDW: 13.6 % (ref 11.5–15.5)
WBC Count: 9.1 K/uL (ref 4.0–10.5)
nRBC: 0 % (ref 0.0–0.2)

## 2023-11-04 LAB — CMP (CANCER CENTER ONLY)
ALT: 16 U/L (ref 0–44)
AST: 16 U/L (ref 15–41)
Albumin: 4.6 g/dL (ref 3.5–5.0)
Alkaline Phosphatase: 73 U/L (ref 38–126)
Anion gap: 7 (ref 5–15)
BUN: 28 mg/dL — ABNORMAL HIGH (ref 8–23)
CO2: 27 mmol/L (ref 22–32)
Calcium: 9.8 mg/dL (ref 8.9–10.3)
Chloride: 103 mmol/L (ref 98–111)
Creatinine: 1.32 mg/dL — ABNORMAL HIGH (ref 0.44–1.00)
GFR, Estimated: 44 mL/min — ABNORMAL LOW (ref >60)
Glucose, Bld: 96 mg/dL (ref 70–99)
Potassium: 4.2 mmol/L (ref 3.5–5.1)
Sodium: 137 mmol/L (ref 135–145)
Total Bilirubin: 0.3 mg/dL (ref 0.0–1.2)
Total Protein: 7.6 g/dL (ref 6.5–8.1)

## 2023-11-04 MED ORDER — ZOLEDRONIC ACID 4 MG/5ML IV CONC
3.0000 mg | Freq: Once | INTRAVENOUS | Status: AC
  Administered 2023-11-04: 3 mg via INTRAVENOUS
  Filled 2023-11-04: qty 3.75

## 2023-11-04 MED ORDER — SODIUM CHLORIDE 0.9 % IV SOLN: Freq: Once | INTRAVENOUS | Status: AC

## 2023-11-04 NOTE — Patient Instructions (Signed)

## 2023-11-04 NOTE — Progress Notes (Signed)
 Patient Care Team: Gretta Lacinda Browning, MD as PCP - General (Family Medicine) Odean Potts, MD as Consulting Physician (Hematology and Oncology) Crawford, Morna Pickle, NP as Nurse Practitioner (Hematology and Oncology) Arelia Filippo, MD as Consulting Physician (Plastic Surgery)  DIAGNOSIS:  Encounter Diagnosis  Name Primary?   Malignant neoplasm of upper-inner quadrant of left breast in female, estrogen receptor positive (HCC) Yes    SUMMARY OF ONCOLOGIC HISTORY: Oncology History  Breast cancer of upper-inner quadrant of left female breast (HCC)  05/26/2015 Initial Diagnosis   Screen detected left breast asymmetry (posterior 1.6 cm): Grade 2-3 IDC ER/PR positive HER-2 positive Ki-67 20% plus calcs (span 6.1 cm): High-grade DCIS with suspicious foci of invasion (4.2 cm apart)   06/24/2015 Surgery   Left simple mastectomy with reconstruction: IDC grade 3, 2.5 cm, with high-grade DCIS, ALH, LVI present, margins negative, 0/1 sentinel node negative, T2 N0 stage II a, ER 100%, PR 5%, HER-2 positive ratio 1.56 with average copy #8.25, Ki-67 20%   07/25/2015 - 11/03/2015 Chemotherapy   Adjuvant chemotherapy with [facility] Perjeta  6 cycles followed by Herceptin  maintenance for 1 year   11/28/2015 - 09/2020 Anti-estrogen oral therapy   Adjuvant anastrozole  1 mg daily; switched to exemestane  on 07/08/18, switched to letrozole      CHIEF COMPLIANT: Surveillance of breast cancer  HISTORY OF PRESENT ILLNESS:  History of Present Illness Heidi Walls is a 68 year old female with chronic pain and kidney disease who presents with chronic pain and kidney disease management.  She experiences chronic back pain, described as stiffness and constant discomfort, which she attributes to chemotherapy and the absence of estrogen. She uses a patch and heating pad for relief but finds them insufficient.    Her kidney function has improved from a GFR of 43 to 53 over the past year, which she attributes  to a 25-pound weight loss. She cannot take certain medications like meloxicam  due to her kidney condition.     ALLERGIES:  is allergic to codeine, nsaids, lisinopril , bee pollen, pollen extract, and sulfamethoxazole .  MEDICATIONS:  Current Outpatient Medications  Medication Sig Dispense Refill   acetaminophen  (TYLENOL ) 325 MG tablet Take 650 mg by mouth every 4 (four) hours as needed.      albuterol  (VENTOLIN  HFA) 108 (90 Base) MCG/ACT inhaler TAKE 2 PUFFS BY MOUTH EVERY 6 HOURS AS NEEDED FOR WHEEZE OR SHORTNESS OF BREATH 18 g 2   atorvastatin (LIPITOR) 10 MG tablet Take 1 tablet by mouth daily.     Biotin 5 MG CAPS Take by mouth.     Carbinoxamine  Maleate 4 MG TABS Take 1 tablet (4 mg total) by mouth 4 (four) times daily as needed. 120 tablet 5   ciclesonide  (ALVESCO ) 80 MCG/ACT inhaler INHALE 1 PUFF INTO THE LUNGS TWICE DAILY 6.1 g 5   diphenhydrAMINE  (SOMINEX) 25 MG tablet Take by mouth.     DULoxetine  (CYMBALTA ) 60 MG capsule Take 1 capsule (60 mg total) by mouth daily. 90 capsule 3   esomeprazole  (NEXIUM ) 20 MG capsule Take 1 capsule (20 mg total) by mouth daily. 90 capsule 0   famotidine  (PEPCID ) 20 MG tablet Take 1 tablet (20 mg total) by mouth 2 (two) times daily. 60 tablet 5   ferrous sulfate 325 (65 FE) MG EC tablet Take by mouth.     Fluticasone  Furoate (ARNUITY ELLIPTA ) 100 MCG/ACT AEPB Inhale 1 puff into the lungs 2 (two) times daily. 30 each 5   gabapentin  (NEURONTIN ) 800 MG tablet Take 1  tablet (800 mg total) by mouth 3 (three) times daily. 90 tablet 5   montelukast  (SINGULAIR ) 10 MG tablet TAKE 1 TABLET BY MOUTH EVERY NIGHT AT BEDTIME 90 tablet 4   Multiple Vitamin (MULTIVITAMIN) tablet Take 1 tablet by mouth daily.     NALTREXONE HCL, PAIN, PO Take 2 mg by mouth 2 (two) times daily.     NASAL SALINE NA Place into the nose daily. Nasal saline wash every morning      olmesartan -hydrochlorothiazide  (BENICAR  HCT) 20-12.5 MG tablet Take 1 tablet by mouth daily.     Omega-3  Fatty Acids (FISH OIL) 1200 MG CAPS Take 1,200 mg by mouth daily.      tiZANidine (ZANAFLEX) 4 MG tablet Take 4 mg by mouth 3 (three) times daily.     valACYclovir  (VALTREX ) 1000 MG tablet Take 1,000 mg by mouth 2 (two) times daily.     vitamin B-12 (CYANOCOBALAMIN) 250 MCG tablet Take by mouth.     ZINC SULFATE PO Take by mouth.     No current facility-administered medications for this visit.    PHYSICAL EXAMINATION: ECOG PERFORMANCE STATUS: 1 - Symptomatic but completely ambulatory  Vitals:   11/04/23 0932  BP: (!) 155/78  Pulse: 84  Resp: 18  Temp: (!) 97.5 F (36.4 C)  SpO2: 98%   Filed Weights   11/04/23 0932  Weight: 164 lb 14.4 oz (74.8 kg)    Physical Exam No palpable lumps or nodules in the right breast, no lumps nodules in the left reconstructed breast or axilla  (exam performed in the presence of a chaperone)  LABORATORY DATA:  I have reviewed the data as listed    Latest Ref Rng & Units 11/01/2022    8:30 AM 10/12/2021   11:25 AM 10/20/2020    1:59 PM  CMP  Glucose 70 - 99 mg/dL 887  899  864   BUN 8 - 23 mg/dL 31  26  24    Creatinine 0.44 - 1.00 mg/dL 8.59  8.77  8.76   Sodium 135 - 145 mmol/L 137  135  136   Potassium 3.5 - 5.1 mmol/L 4.5  4.9  3.7   Chloride 98 - 111 mmol/L 103  104  99   CO2 22 - 32 mmol/L 27  25  27    Calcium 8.9 - 10.3 mg/dL 89.9  9.9  89.9   Total Protein 6.5 - 8.1 g/dL 7.7  7.7  7.7   Total Bilirubin 0.3 - 1.2 mg/dL 0.3  0.2  0.7   Alkaline Phos 38 - 126 U/L 94  80  88   AST 15 - 41 U/L 21  15  27    ALT 0 - 44 U/L 22  15  25      Lab Results  Component Value Date   WBC 9.1 11/04/2023   HGB 12.1 11/04/2023   HCT 37.3 11/04/2023   MCV 85.2 11/04/2023   PLT 289 11/04/2023   NEUTROABS 5.8 11/04/2023    ASSESSMENT & PLAN:  Breast cancer of upper-inner quadrant of left female breast (HCC) Left simple mastectomy with reconstruction 06/24/2015: IDC grade 3, 2.5 cm, with high-grade DCIS, ALH, LVI present, margins negative, 0/1  sentinel node negative, , ER 100%, PR 5%, HER-2 positive ratio 1.56 with average copy #8.25, Ki-67 20%   Pathologic stage:T2 N0 stage II a Treatment plan: 1. Adjuvant chemotherapy with TCHP X 6 completed 11/07/15 followed by Herceptin  maintenance for 1 year which completed 08/07/2016 2. Followed by adjuvant hormonal  therapy with anastrozole  started 11/28/2015 ---------------------------------------------------------------------------------------------------------- Current treatment:  Echocardiogram 05/22/16 LVEF 60-65% Anastrozole  started 11/28/15 switched to letrozole  07/08/2018-completed October 2022 Chronic kidney disease: Follows with nephrology Right knee replacement: May 2025   bone density test 07/10/2018: T score -2.4. Osteopenia.  On Zometa  once a year   Prior history of ankle fracture   Breast cancer surveillance:  Right breast mammogram 08/03/2022 benign at [facility] breast density category B Breast exam 11/04/2023: Benign   She had Zometa  once a year   Anxiety: On antianxiety medications    I will see her back in 1 year for follow-up along with Zometa  infusion and labs ------------------------------------- Assessment and Plan Assessment & Plan Breast cancer in remission Breast cancer in remission for almost eight years with chronic pain likely related to previous chemotherapy and estrogen depletion.  Chronic kidney disease, stage 3a Chronic kidney disease stage 3a with well-managed renal function. Recent improvement in GFR from 43 to 53 over the past year, likely due to weight loss and improved management.  Osteoarthritis Severe osteoarthritis with chronic pain, particularly in the back. Previous knee replacement surgery. Unable to use NSAIDs due to kidney disease. - Discussed potential alternative therapies such as pulse electric wave therapy, but noted it may not be covered by insurance.  Carpal tunnel syndrome Carpal tunnel syndrome, common with age. Previous  recommendation for meloxicam  not feasible due to kidney disease.  General Health Maintenance Bone density test needed. - Order bone density test.      No orders of the defined types were placed in this encounter.  The patient has a good understanding of the overall plan. she agrees with it. she will call with any problems that may develop before the next visit here.  I personally spent a total of 30 minutes in the care of the patient today including preparing to see the patient, getting/reviewing separately obtained history, performing a medically appropriate exam/evaluation, counseling and educating, placing orders, referring and communicating with other health care professionals, documenting clinical information in the EHR, independently interpreting results, communicating results, and coordinating care.   Viinay K Haydn Cush, MD 11/04/23

## 2023-11-04 NOTE — Assessment & Plan Note (Addendum)
 Left simple mastectomy with reconstruction 06/24/2015: IDC grade 3, 2.5 cm, with high-grade DCIS, ALH, LVI present, margins negative, 0/1 sentinel node negative, , ER 100%, PR 5%, HER-2 positive ratio 1.56 with average copy #8.25, Ki-67 20%   Pathologic stage:T2 N0 stage II a Treatment plan: 1. Adjuvant chemotherapy with TCHP X 6 completed 11/07/15 followed by Herceptin  maintenance for 1 year which completed 08/07/2016 2. Followed by adjuvant hormonal therapy with anastrozole  started 11/28/2015 ---------------------------------------------------------------------------------------------------------- Current treatment:  Echocardiogram 05/22/16 LVEF 60-65% Anastrozole  started 11/28/15 switched to letrozole  07/08/2018-completed October 2022 Chronic kidney disease: Follows with nephrology Right knee replacement: May 2025   bone density test 07/10/2018: T score -2.4. Osteopenia.  On Zometa  once a year   Prior history of ankle fracture   Breast cancer surveillance:  Right breast mammogram 08/03/2022 benign at [facility] breast density category B Breast exam 11/04/2023: Benign   She had Zometa  once a year   Anxiety: On antianxiety medications    I will see her back in 1 year for follow-up.

## 2024-11-03 ENCOUNTER — Inpatient Hospital Stay: Payer: Medicare (Managed Care) | Admitting: Hematology and Oncology

## 2024-11-03 ENCOUNTER — Inpatient Hospital Stay: Payer: Medicare (Managed Care)
